# Patient Record
Sex: Female | Born: 1935 | Race: White | Hispanic: No | Marital: Married | Smoking: Never smoker
Health system: Southern US, Community
[De-identification: ages and names within clinical notes are randomized; demographics above are authoritative.]

## PROBLEM LIST (undated history)

## (undated) DIAGNOSIS — R112 Nausea with vomiting, unspecified: Secondary | ICD-10-CM

## (undated) DIAGNOSIS — G709 Myoneural disorder, unspecified: Secondary | ICD-10-CM

## (undated) DIAGNOSIS — I509 Heart failure, unspecified: Secondary | ICD-10-CM

## (undated) DIAGNOSIS — E039 Hypothyroidism, unspecified: Secondary | ICD-10-CM

## (undated) DIAGNOSIS — I499 Cardiac arrhythmia, unspecified: Secondary | ICD-10-CM

## (undated) DIAGNOSIS — G2581 Restless legs syndrome: Secondary | ICD-10-CM

## (undated) DIAGNOSIS — J189 Pneumonia, unspecified organism: Secondary | ICD-10-CM

## (undated) DIAGNOSIS — T8859XA Other complications of anesthesia, initial encounter: Secondary | ICD-10-CM

## (undated) DIAGNOSIS — J449 Chronic obstructive pulmonary disease, unspecified: Secondary | ICD-10-CM

## (undated) DIAGNOSIS — R600 Localized edema: Secondary | ICD-10-CM

## (undated) DIAGNOSIS — I10 Essential (primary) hypertension: Secondary | ICD-10-CM

## (undated) DIAGNOSIS — K5790 Diverticulosis of intestine, part unspecified, without perforation or abscess without bleeding: Secondary | ICD-10-CM

## (undated) DIAGNOSIS — I480 Paroxysmal atrial fibrillation: Secondary | ICD-10-CM

## (undated) DIAGNOSIS — K219 Gastro-esophageal reflux disease without esophagitis: Secondary | ICD-10-CM

## (undated) DIAGNOSIS — I700 Atherosclerosis of aorta: Secondary | ICD-10-CM

## (undated) DIAGNOSIS — J302 Other seasonal allergic rhinitis: Secondary | ICD-10-CM

## (undated) DIAGNOSIS — I5032 Chronic diastolic (congestive) heart failure: Secondary | ICD-10-CM

## (undated) DIAGNOSIS — Z9889 Other specified postprocedural states: Secondary | ICD-10-CM

## (undated) DIAGNOSIS — T4145XA Adverse effect of unspecified anesthetic, initial encounter: Secondary | ICD-10-CM

## (undated) DIAGNOSIS — M1990 Unspecified osteoarthritis, unspecified site: Secondary | ICD-10-CM

## (undated) DIAGNOSIS — K449 Diaphragmatic hernia without obstruction or gangrene: Secondary | ICD-10-CM

## (undated) HISTORY — PX: OTHER SURGICAL HISTORY: SHX169

## (undated) HISTORY — PX: EYE SURGERY: SHX253

## (undated) HISTORY — PX: ABDOMINAL HYSTERECTOMY: SHX81

## (undated) HISTORY — PX: ROTATOR CUFF REPAIR: SHX139

## (undated) HISTORY — PX: BREAST EXCISIONAL BIOPSY: SUR124

## (undated) HISTORY — DX: Chronic diastolic (congestive) heart failure: I50.32

## (undated) HISTORY — PX: BREAST SURGERY: SHX581

## (undated) HISTORY — PX: CARDIOVERSION: SHX1299

## (undated) HISTORY — DX: Atherosclerosis of aorta: I70.0

## (undated) HISTORY — PX: APPENDECTOMY: SHX54

## (undated) HISTORY — PX: TUBAL LIGATION: SHX77

## (undated) HISTORY — PX: RHINOPLASTY: SUR1284

## (undated) HISTORY — PX: CATARACT EXTRACTION: SUR2

---
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
## 1971-04-19 HISTORY — PX: THYROIDECTOMY: SHX17

## 1998-06-09 ENCOUNTER — Encounter: Payer: Self-pay | Admitting: Geriatric Medicine

## 1998-06-09 ENCOUNTER — Inpatient Hospital Stay (HOSPITAL_COMMUNITY): Admission: EM | Admit: 1998-06-09 | Discharge: 1998-06-15 | Payer: Self-pay | Admitting: Emergency Medicine

## 1998-06-10 ENCOUNTER — Encounter: Payer: Self-pay | Admitting: Geriatric Medicine

## 1998-06-12 ENCOUNTER — Encounter: Payer: Self-pay | Admitting: Geriatric Medicine

## 1998-06-13 ENCOUNTER — Encounter: Payer: Self-pay | Admitting: Geriatric Medicine

## 1998-06-15 ENCOUNTER — Encounter: Payer: Self-pay | Admitting: *Deleted

## 1998-07-13 ENCOUNTER — Encounter: Payer: Self-pay | Admitting: Gastroenterology

## 1998-07-13 ENCOUNTER — Ambulatory Visit (HOSPITAL_COMMUNITY): Admission: RE | Admit: 1998-07-13 | Discharge: 1998-07-13 | Payer: Self-pay | Admitting: Gastroenterology

## 1999-07-15 ENCOUNTER — Encounter: Payer: Self-pay | Admitting: *Deleted

## 1999-07-15 ENCOUNTER — Encounter: Admission: RE | Admit: 1999-07-15 | Discharge: 1999-07-15 | Payer: Self-pay | Admitting: *Deleted

## 1999-12-28 ENCOUNTER — Encounter: Admission: RE | Admit: 1999-12-28 | Discharge: 1999-12-28 | Payer: Self-pay | Admitting: *Deleted

## 1999-12-28 ENCOUNTER — Encounter: Payer: Self-pay | Admitting: Internal Medicine

## 2000-07-13 ENCOUNTER — Emergency Department (HOSPITAL_COMMUNITY): Admission: EM | Admit: 2000-07-13 | Discharge: 2000-07-13 | Payer: Self-pay | Admitting: Emergency Medicine

## 2000-07-13 ENCOUNTER — Encounter: Payer: Self-pay | Admitting: Emergency Medicine

## 2000-10-27 ENCOUNTER — Encounter: Payer: Self-pay | Admitting: Gastroenterology

## 2000-10-27 ENCOUNTER — Ambulatory Visit (HOSPITAL_COMMUNITY): Admission: RE | Admit: 2000-10-27 | Discharge: 2000-10-27 | Payer: Self-pay | Admitting: Gastroenterology

## 2001-01-09 ENCOUNTER — Encounter: Payer: Self-pay | Admitting: Internal Medicine

## 2001-01-09 ENCOUNTER — Encounter: Admission: RE | Admit: 2001-01-09 | Discharge: 2001-01-09 | Payer: Self-pay | Admitting: Internal Medicine

## 2001-04-23 ENCOUNTER — Ambulatory Visit (HOSPITAL_COMMUNITY): Admission: RE | Admit: 2001-04-23 | Discharge: 2001-04-23 | Payer: Self-pay | Admitting: Gastroenterology

## 2002-01-16 ENCOUNTER — Encounter: Admission: RE | Admit: 2002-01-16 | Discharge: 2002-01-16 | Payer: Self-pay | Admitting: Internal Medicine

## 2002-01-16 ENCOUNTER — Encounter: Payer: Self-pay | Admitting: Internal Medicine

## 2003-01-21 ENCOUNTER — Encounter: Payer: Self-pay | Admitting: Internal Medicine

## 2003-01-21 ENCOUNTER — Encounter: Admission: RE | Admit: 2003-01-21 | Discharge: 2003-01-21 | Payer: Self-pay | Admitting: Internal Medicine

## 2003-07-22 ENCOUNTER — Encounter: Admission: RE | Admit: 2003-07-22 | Discharge: 2003-07-22 | Payer: Self-pay | Admitting: Internal Medicine

## 2003-09-10 ENCOUNTER — Encounter: Admission: RE | Admit: 2003-09-10 | Discharge: 2003-09-10 | Payer: Self-pay | Admitting: Internal Medicine

## 2004-01-28 ENCOUNTER — Encounter: Admission: RE | Admit: 2004-01-28 | Discharge: 2004-01-28 | Payer: Self-pay | Admitting: Internal Medicine

## 2005-02-11 ENCOUNTER — Encounter: Admission: RE | Admit: 2005-02-11 | Discharge: 2005-02-11 | Payer: Self-pay | Admitting: Internal Medicine

## 2005-08-23 ENCOUNTER — Inpatient Hospital Stay (HOSPITAL_COMMUNITY): Admission: AD | Admit: 2005-08-23 | Discharge: 2005-08-26 | Payer: Self-pay | Admitting: Internal Medicine

## 2005-08-24 ENCOUNTER — Encounter (INDEPENDENT_AMBULATORY_CARE_PROVIDER_SITE_OTHER): Payer: Self-pay | Admitting: Interventional Cardiology

## 2005-11-03 ENCOUNTER — Ambulatory Visit (HOSPITAL_COMMUNITY): Admission: RE | Admit: 2005-11-03 | Discharge: 2005-11-03 | Payer: Self-pay | Admitting: Cardiology

## 2005-11-07 ENCOUNTER — Encounter: Admission: RE | Admit: 2005-11-07 | Discharge: 2005-11-07 | Payer: Self-pay | Admitting: Internal Medicine

## 2005-11-30 ENCOUNTER — Ambulatory Visit: Payer: Self-pay | Admitting: Critical Care Medicine

## 2005-12-02 ENCOUNTER — Ambulatory Visit: Payer: Self-pay | Admitting: Internal Medicine

## 2005-12-09 ENCOUNTER — Ambulatory Visit (HOSPITAL_COMMUNITY): Admission: RE | Admit: 2005-12-09 | Discharge: 2005-12-09 | Payer: Self-pay | Admitting: Cardiology

## 2005-12-22 ENCOUNTER — Ambulatory Visit: Payer: Self-pay | Admitting: Critical Care Medicine

## 2005-12-28 ENCOUNTER — Ambulatory Visit: Payer: Self-pay | Admitting: Critical Care Medicine

## 2006-01-04 ENCOUNTER — Encounter: Admission: RE | Admit: 2006-01-04 | Discharge: 2006-01-04 | Payer: Self-pay | Admitting: Internal Medicine

## 2006-01-09 ENCOUNTER — Encounter (INDEPENDENT_AMBULATORY_CARE_PROVIDER_SITE_OTHER): Payer: Self-pay | Admitting: Specialist

## 2006-01-09 ENCOUNTER — Other Ambulatory Visit: Admission: RE | Admit: 2006-01-09 | Discharge: 2006-01-09 | Payer: Self-pay | Admitting: Interventional Radiology

## 2006-01-09 ENCOUNTER — Encounter: Admission: RE | Admit: 2006-01-09 | Discharge: 2006-01-09 | Payer: Self-pay | Admitting: Internal Medicine

## 2006-02-13 ENCOUNTER — Encounter: Admission: RE | Admit: 2006-02-13 | Discharge: 2006-02-13 | Payer: Self-pay | Admitting: Internal Medicine

## 2006-03-31 ENCOUNTER — Ambulatory Visit: Payer: Self-pay | Admitting: Critical Care Medicine

## 2006-09-19 ENCOUNTER — Encounter: Admission: RE | Admit: 2006-09-19 | Discharge: 2006-09-19 | Payer: Self-pay | Admitting: Orthopedic Surgery

## 2006-09-21 ENCOUNTER — Ambulatory Visit (HOSPITAL_BASED_OUTPATIENT_CLINIC_OR_DEPARTMENT_OTHER): Admission: RE | Admit: 2006-09-21 | Discharge: 2006-09-22 | Payer: Self-pay | Admitting: Orthopedic Surgery

## 2007-02-15 ENCOUNTER — Encounter: Admission: RE | Admit: 2007-02-15 | Discharge: 2007-02-15 | Payer: Self-pay | Admitting: Internal Medicine

## 2007-02-21 ENCOUNTER — Encounter: Admission: RE | Admit: 2007-02-21 | Discharge: 2007-02-21 | Payer: Self-pay | Admitting: Internal Medicine

## 2008-02-18 ENCOUNTER — Encounter: Admission: RE | Admit: 2008-02-18 | Discharge: 2008-02-18 | Payer: Self-pay | Admitting: Internal Medicine

## 2008-10-31 ENCOUNTER — Ambulatory Visit (HOSPITAL_COMMUNITY): Admission: RE | Admit: 2008-10-31 | Discharge: 2008-10-31 | Payer: Self-pay | Admitting: Cardiology

## 2009-02-18 ENCOUNTER — Encounter: Admission: RE | Admit: 2009-02-18 | Discharge: 2009-02-18 | Payer: Self-pay | Admitting: Internal Medicine

## 2009-02-24 ENCOUNTER — Encounter: Admission: RE | Admit: 2009-02-24 | Discharge: 2009-02-24 | Payer: Self-pay | Admitting: Neurology

## 2009-03-05 ENCOUNTER — Encounter: Admission: RE | Admit: 2009-03-05 | Discharge: 2009-03-05 | Payer: Self-pay | Admitting: Neurology

## 2009-06-05 ENCOUNTER — Encounter: Admission: RE | Admit: 2009-06-05 | Discharge: 2009-06-05 | Payer: Self-pay | Admitting: Internal Medicine

## 2009-07-13 ENCOUNTER — Encounter: Admission: RE | Admit: 2009-07-13 | Discharge: 2009-07-13 | Payer: Self-pay | Admitting: Internal Medicine

## 2010-02-19 ENCOUNTER — Encounter: Admission: RE | Admit: 2010-02-19 | Discharge: 2010-02-19 | Payer: Self-pay | Admitting: Internal Medicine

## 2010-03-03 ENCOUNTER — Encounter: Admission: RE | Admit: 2010-03-03 | Discharge: 2010-03-03 | Payer: Self-pay | Admitting: Internal Medicine

## 2010-04-16 ENCOUNTER — Ambulatory Visit (HOSPITAL_COMMUNITY)
Admission: RE | Admit: 2010-04-16 | Discharge: 2010-04-16 | Payer: Self-pay | Source: Home / Self Care | Attending: Surgery | Admitting: Surgery

## 2010-04-16 ENCOUNTER — Encounter
Admission: RE | Admit: 2010-04-16 | Discharge: 2010-04-16 | Payer: Self-pay | Source: Home / Self Care | Attending: Surgery | Admitting: Surgery

## 2010-06-28 LAB — PROTIME-INR: Prothrombin Time: 15.4 seconds — ABNORMAL HIGH (ref 11.6–15.2)

## 2010-06-28 LAB — CBC
Hemoglobin: 15.8 g/dL — ABNORMAL HIGH (ref 12.0–15.0)
MCH: 33.9 pg (ref 26.0–34.0)
MCV: 94.6 fL (ref 78.0–100.0)
RBC: 4.66 MIL/uL (ref 3.87–5.11)
WBC: 6.1 10*3/uL (ref 4.0–10.5)

## 2010-06-28 LAB — BASIC METABOLIC PANEL
CO2: 26 mEq/L (ref 19–32)
Chloride: 103 mEq/L (ref 96–112)
GFR calc Af Amer: >60 mL/min (ref >60)
Sodium: 136 mEq/L (ref 135–145)

## 2010-06-28 LAB — APTT: aPTT: 40 seconds — ABNORMAL HIGH (ref 24–37)

## 2010-06-28 LAB — SURGICAL PCR SCREEN
MRSA, PCR: NEGATIVE
Staphylococcus aureus: NEGATIVE

## 2010-07-25 LAB — PROTIME-INR
INR: 2.9 — ABNORMAL HIGH (ref 0.00–1.49)
Prothrombin Time: 32.5 seconds — ABNORMAL HIGH (ref 11.6–15.2)

## 2010-07-25 LAB — APTT: aPTT: 55 seconds — ABNORMAL HIGH (ref 24–37)

## 2010-08-31 NOTE — Op Note (Signed)
NAME:  Natasha [name], Natasha [name]                 ACCOUNT NO.:  [id number]   MEDICAL RECORD NO.:  [id number]          PATIENT TYPE:  AMB   LOCATION:  DSC                          FACILITY:  [facility]   PHYSICIAN:  Katy Fitch. Sypher, M.D. DATE OF BIRTH:  1932/10/20   DATE OF PROCEDURE:  09/21/2006  DATE OF DISCHARGE:                               OPERATIVE REPORT   PREOPERATIVE DIAGNOSIS:  Chronic retracted right rotator cuff tear with  AC degenerative arthritis.   POSTOPERATIVE DIAGNOSIS:  Chronic retracted right rotator cuff tear with  AC degenerative arthritis with identification of a severely retracted  chronic right supraspinatus rotator cuff tear with degenerative changes  in infraspinatus and 50% infraspinatus retracted rotator cuff tear and  undermining of teres minor.   OPERATION:  1. Examination of right shoulder under anesthesia.  2. Subacromial decompression right shoulder with acromioplasty,      coracoacromial ligament release and acromioplasty.  3. Open resection of distal clavicle.  4. Open reconstruction of chronically retracted right rotator cuff      tear involving the entire infraspinatus and supraspinatus tendons      requiring release of the coracohumeral ligament, tenolysis of      rotator cuff, convergence and lateralization.   OPERATING SURGEON:  Josephine Igo, M.D.   ASSISTANT:  Molly Maduro Dasnoit PA-C.   ANESTHESIA:  General endotracheal supplemented by a right interscalene  block, supervising anesthesiologist is Dr. Gypsy Balsam.   INDICATIONS:  Natasha [name] is a 77 year old woman referred through the  courtesy of Dr. Lyna Poser for evaluation of a painful right  shoulder.   She has multiple medical problems including hypertension, coronary  artery disease, chronic Coumadin anticoagulation and recently the onset  of progressive right shoulder pain.   Clinical examination revealed signs of adhesive capsulitis and AC  arthropathy and probable rotator cuff tear on the  right due to weakness  of arm elevation and external rotation.  An MRI of the right shoulder  was obtained on August 03, 2006 at [facility] and interpreted by Dr.  Suzie Portela to reveal a retracted tear of supraspinatus tendon with an  irregular and edematous stump and the muscle volume loss of the  supraspinatus without fatty substitution.  There was an interstitial  tear of the infraspinatus, subscapularis tendinosis, AC arthropathy and  a shoulder joint effusion extending into the sub deltoid and subcoracoid  bursa.   After lengthy informed consent with Natasha [name], we advised her that she  was a candidate for reconstruction of her rotator cuff as long as she  was cleared by Dr. Kevan Ny and her cardiologist, Dr. Armanda Magic.   She was seen in consultation by Dr. Mayford Knife and had a stress test prior  to surgery.  Dr. Mayford Knife stated that her stress test was entirely normal  and that she was a proper candidate for general anesthesia.   Natasha [name] was advised to stop her Coumadin anticoagulation 5 days  prior to surgery.  A preoperative INR had normalized.   After informed consent, she is brought to the operating at this time  anticipating right shoulder glenohumeral  arthroscopy followed by open  reconstruction of a retracted chronic rotator cuff tear with distal  clavicle resection.  Preoperatively questions were invited and answered.   PROCEDURE:  Natasha [name] is brought to the operating room and placed in  supine position on the operating table.  Following anesthesia consult  with Dr. Gypsy Balsam an interscalene block was placed without complication on  the right.  This led to excellent anesthesia of the right arm and  forequarter.   Natasha [name] was brought to room two placed in the supine position on the  operating table and under Dr. Burnett Corrente direct supervision general  endotracheal anesthesia induced.  She was carefully positioned in the  beach-chair position with the aid of a torso and  head holder designed  for shoulder arthroscopy.   Due to right arm and forequarter prepped with DuraPrep and draped with  impervious arthroscopy drapes.   Examination of the shoulder under anesthesia revealed moderate adhesive  capsulitis with elevation 160, external rotation of 80, extension  lacking 20 degrees.   The shoulder was distended with 20 mL of sterile saline followed by  placement of the arthroscope through a standard posterior viewing  portal.  Diagnostic arthroscopy revealed a large retracted necrotic cuff  tear involving entire supraspinatus and most of the infraspinatus.  There is some undermining of the teres minor.  Subscapularis had  moderate tendinosis but was not retracted.  The labrum had minor  degenerative change.  The hyaline sigmoid cartilage of the glenoid  humeral head was intact.  A long head of the biceps had a stable origin  and tendon was normal into the rotator interval.  The scope was removed  from glenohumeral joint and briefly placed in the subacromial space.  There was florid bursitis noted.  Therefore we proceeded directly to  reconstruction of rotator cuff.   A 6 cm incision was fashioned on the distal clavicle across the anterior  acromion.  The subcutaneous tissue were sharply divided and hemostasis  achieved.  The acromial branch of the coracoacromial artery was  identified and electrocauterized followed by elevation of the anterior  third of the deltoid exposing the [facility] joint and bursa.  The distal 15 mm  clavicle was exposed subperiosteally and resected with an oscillating  saw.  The medial acromial osteophyte at the [facility] joint was removed with a  rongeur followed by leveling the acromion to a type 1 morphology with an  oscillating saw.  The cuff tear was chronic, retracted and exposing  considerable portion the humeral head.  The conjoint tendon was completely disrupted and retracted about 4 cm medially.   The margin of the tendon were  freshened with a 15 scalpel blade followed  by deep surface debridement with a rongeur.  The greater tuberosity was  decorticated deep to the teres minor infraspinatus and supraspinatus to  the level the rotator interval.  Two biocorkscrew anchors were placed  medially one at the central portion of the infraspinatus tendon, one at  the central portion of the supraspinatus tendon.  The tendon was then  converge with a gathering baseball stitch that was brought through a  [employer] drill holes, advancing the tendon laterally and closing the  defect in the conjoint tendon.  The entire rotator cuff mechanism was  lateralized after release of coracohumeral ligament and extensive  resection of fibrotic bursa.  A small Cobb elevator was used to lyse  bursal adhesions circumferentially.   Ultimately an anatomic repair was achieved with convergence utilizing  the baseball stitch and the supraspinatus foot print mattress sutures.  The infraspinatus was inset with mattress sutures posteriorly.   The triangular defect laterally was closed with a mattress suture of 0-0  Vicryl brought through bony drill holes.   The bursa was then gathered and after irrigation of the subacromial  space.  The deltoid repaired anatomically to the anterior acromion as  well as repaired to the trapezius closing the dead space at the site of  the distal clavicle resection.  The deltoid split laterally was closed  with simple suture of 0-0 Vicryl.   The wound was then repaired with subdermal sutures of 2-0 Vicryl and  intradermal 3-0 Prolene with Steri-Strips.   There no apparent complications.  Our blood loss was less than 20 mL.   The quality of the rotator cuff tendon was jeopardized due to chronic  degenerative tendinopathy; however, a watertight closure was achieved  with excellent opposition to the greater tuberosity.   Natasha [name]'s range of motion was checked after repair.  She had the  same elevation of  160 and the same defect in external rotation due to  contraction the anterior capsule.   We will address this with therapy postoperatively.   All the sponge and needle counts were correct.  Ms. Marasco was awakened  from general anesthesia and transferred to recovery room with stable  vital signs.   We will resume her Coumadin with a dose of 5 mg at 4:00 p.m. this  afternoon.  She will be maintained on IV vancomycin as a perioperative  antibiotic and will be given Dilaudid p.o. and IV as an analgesic.      Katy Fitch Sypher, M.D.  Electronically Signed     RVS/MEDQ  D:  09/21/2006  T:  09/21/2006  Job:  [id number]   cc:   Candyce Churn, M.D.  Armanda Magic, M.D.

## 2010-09-03 NOTE — Assessment & Plan Note (Signed)
[facility]                             PULMONARY OFFICE NOTE   NAME:[name], Natasha CROTTEAU                        MRN:          [id number]  DATE:03/31/2006                            DOB:          15-Jul-1932    Natasha [name] is a 77 year old white female with a history of cyclic cough,  felt to be due primarily from allergic rhinitis, postnasal drip, and  reflux disease.  Her cough is virtually gone now.  She is back on the  Prilosec 20 mg a day. BroveX is only p.r.n.   EXAM:  Temperature was 97.8.  Blood pressure 136/86.  Pulse 78.  Saturation 97% on room air.  CHEST:  Clear without evidence of wheeze or rhonchi.  CARDIAC:  Regular rate and rhythm without S3.  Normal S1, S2.  ABDOMEN:  Soft, nontender.  EXTREMITIES:  No edema or clubbing.  SKIN:  Clear.   IMPRESSION:  That of cyclic cough on the basis of reflux disease and  allergic rhinitis stable at this time.   PLAN:  For the patient to maintain Prilosec 20 mg daily, p.r.n.  Benzonatate, and p.r.n. BroveX.  We will see the patient back on an as-  needed basis.  She did ask about the small pulmonary nodule seen on the  CT scan in May of this year.  I reviewed that and informed the patient  that this was a benign condition and did not require further followup.  We will see the patient back on a p.r.n. basis in this office.     Charlcie Cradle Delford Field, MD, [facility]  Electronically Signed    PEW/MedQ  DD: 03/31/2006  DT: 03/31/2006  Job #: [id number]   cc:   Candyce Churn, M.D.

## 2010-09-03 NOTE — Discharge Summary (Signed)
NAME:  Natasha [name], Natasha [name] NO.:  [id number]   MEDICAL RECORD NO.:  [id number]          PATIENT TYPE:  INP   LOCATION:  2022                         FACILITY:  [facility]   PHYSICIAN:  Candyce Churn, M.D.DATE OF BIRTH:  1932-10-29   DATE OF ADMISSION:  08/23/2005  DATE OF DISCHARGE:  08/26/2005                                 DISCHARGE SUMMARY   DISCHARGE DIAGNOSES:  1.  New onset atrial fibrillation.  2.  Headache, likely migraine secondary to caffeine withdrawal -- improved.  3.  History of hypertension.  4.  History of hypothyroidism.  5.  Hormone replacement therapy in the form of Premarin.  6.  Gastroesophageal reflux disease with esophageal strictures.  7.  Fibromyalgia.  8.  History of pneumonia.  9.  Restless leg syndrome.  10. Seasonal allergies.  11. Migraine headaches.  12. Diverticulosis.   DISCHARGE MEDICATIONS:  1.  Cardizem SR 240 mg orally daily.  2.  Premarin 0.3 mg orally daily.  3.  Prilosec 20 mg orally daily.  4.  Synthroid 150 mcg orally daily.  5.  Lovenox 100 mg subcutaneously q.12 h. until INR between 2.0 and 3.0 on      Coumadin.  6.  Coumadin 5 mg daily.  7.  Nasal steroid spray, one spray in each nostril every other day --      Rhinocort Aqua or Nasacort Aqua.  8.  Ativan 0.5 mg orally p.o. h.s. p.r.n.  9.  Allegra 60 mg daily as needed.  10. Calcium carbonate 1200 mg with vitamin D daily.   CONSULTATIONS:  Cardiology -- Armanda Magic, M.D.   PROCEDURES:  1.  2-D echocardiogram -- transthoracic; performed Aug 24, 2005.  She was      found to have normal ejection fraction of 60-65%.  There were no      regional wall motion abnormalities, and ventricular wall thickness was      in the upper limits of normal.  There was mild-to-moderate mitral      valvular regurgitation.  Aortic valve thickness was mildly increased.      Left atrium diameter was upper limits of normal, at 39 mm.   1.  Myocardial perfusion study; performed Aug 22, 2005.  Revealed an ejection      fraction of 76%, and there were no perfusion defects.  Technetium 99 was      used.   HOSPITAL COURSE:  [name] was just presented to my office on Aug 23, 2005, complaining of a cough for one month.  She was felt to have a  pneumonia in April, but continued to cough incessantly.   She was found to be in a rapid irregular rhythm at approximately 130-140  beats per minute.  An EKG atrial fibrillation .   The patient was admitted and started on intravenous Cardizem and  subcutaneous Lovenox.  She was seen in consultation by cardiology, where she  underwent a 2-D echocardiogram and myocardial perfusion study (as above).   While hospitalized she did have a relatively severe headache on the second  day of  hospitalization.  After her myocardial perfusion study she responded  well to Cafergot suppository.  This was held, and she was treated with  morphine for headache (which was throbbing and nonfocal, with no peripheral  symptoms).  Once she was found to have no evidence of myocardial ischemia,  the Cafergot was allowed and her headaches improved rapidly.  It was gone by  hospital discharge.   She will be discharged on Coumadin therapy, as above.  To follow with Dr.  Armanda Magic in one week.  I will be happy to follow her at our Coumadin  clinic at [facility].   I was first concerned about a pulmonary embolus with some fleeting chest  pains and rapid atrial fibrillation; but chest CT on the day of admission  was without abnormality, which ruled out a pulmonary embolus.  She did have  some small apical pulmonary nodules that need to be followed up. We will  plan a repeat CT of the chest in 4-6 weeks for follow-up, in order to follow  these tiny nodules.   She has no symptoms to suggest metastatic neoplasm; with no anorexia, weight  loss, etc.   LABORATORIES WHILE ADMITTED:  Slight low potassium on admission of 3.3; this  corrected to 3.7  with oral supplementation on Aug 24, 2005.  Prothrombin time  on admission 13.1, and on discharge 13.4 with an INR 1.0.  White count (Aug 25, 2005) 5900, hemoglobin 12.7, platelet count 254,000.  Myocardial markers  while hospitalized were within normal limits, with CKs being 44, 43 and 35  respectively.  Troponin I being less than 0.01, then 0.02 and 0.02.  Urinalysis was normal.  Thyroid function revealed a TSH 0.487.  Free T3 of  2.5 (which is normal), and Free T4 of 0.142 (which is also normal).  Creatinine 0.6, BUN 7, calcium 8.5.   FOLLOWUP:  The patient will follow up in the office on Aug 29, 2005 for PT,  INR; and with Dr. Armanda Magic in one week.      Candyce Churn, M.D.  Electronically Signed     RNG/MEDQ  D:  08/28/2005  T:  08/29/2005  Job:  [id number]

## 2010-09-03 NOTE — H&P (Signed)
NAME:  Natasha [name], BEACHEM NO.:  [id number]   MEDICAL RECORD NO.:  [id number]          PATIENT TYPE:  INP   LOCATION:  2022                         FACILITY:  [facility]   PHYSICIAN:  Candyce Churn, M.D.DATE OF BIRTH:  02-22-1933   DATE OF ADMISSION:  08/23/2005  DATE OF DISCHARGE:                                HISTORY & PHYSICAL   CHIEF COMPLAINT:  Cough and rapid atrial fibrillation.   HISTORY OF PRESENT ILLNESS:  Natasha [name] is a pleasant 77 year old female  with a history of:  1.  Hypertension.  2.  Hypothyroidism.  3.  Hormone replacement therapy in the form of Premarin.  4.  GERD with esophageal stricture.  5.  Fibromyalgia.  6.  History of pneumonia.  7.  Restless leg syndrome.  8.  Seasonal allergies.  9.  Migraine headaches.  10. Diverticulosis.   Natasha [name] presents with approximately 1 month of cough which started with  symptoms of a sinus infection and URI with yellow-green drainage with  wheezing and congestion on July 19, 2005.  She was treated with Levaquin for  10 days as well as IM Depo-Medrol and nebulizer treatments with albuterol.   She was last seen in our office on July 25, 2005, and presents today with  continued complaint of cough.  She did have some fleeting shooting, stabbing-  like left chest pain near her left breast that occurred earlier today.  She  is not area of any palpitations.  Denies any PND or orthopnea.  No  exertionally related chest pain.   PAST MEDICAL HISTORY:   ALLERGIES:  PENICILLIN, ERYTHROMYCIN, TETRACYCLINE, BIAXIN, PREVACID,  CYMBALTA.  These are either allergies or intolerances to.   MEDICATIONS:  1.  Synthroid 150 mcg daily.  2.  Premarin 0.625 mg daily.  3.  Prilosec 20 mg daily.  4.  Ativan 0.5 mg p.o. q.h.s. p.r.n.  5.  Diovan HCT 160/12.5 daily.  6.  Lasix 40 mg daily on p.r.n. basis for leg edema.  7.  Calcium carbonate 600 b.i.d.  8.  Rhinocort Aqua 1 spray in each nostril at night p.r.n.  9.   Allegra 60 mg daily p.r.n.  10. Bactroban in saline solution nasally intermittently for episodic nasal      infection.  11. Senokot 1-2 at night to prevent constipation.   PAST SURGICAL HISTORY:  1.  TAH and BSO.  2.  Tubal ligation.  3.  Appendectomy.   FAMILY HISTORY:  Significant for lung cancer, coronary artery disease,  congestive heart failure, stroke, and Berger's disease.   SOCIAL HISTORY:  The patient is a housewife.  She is married to Mr. Darvin Neighbours, and they live in the [place] area.  She does not smoke and  rarely has an alcoholic beverage.   REVIEW OF SYSTEMS:  She has had some fleeting left chest pains which were  nonexertionally related and sharp earlier today.  She also had some, over  the last week or so, has noted some transient right lower extremity pain in  the right groin area and right calf, but  this has resolved.  She denies any  change in bowel habits.  She does have some stress urinary incontinence  which has been somewhat problematic with recent cough.  She has had  occasional sweats but no obvious fevers.   Denies any hematuria or blood per rectum.  No headaches or visual changes.   PHYSICAL EXAMINATION:  GENERAL:  Alert, no acute distress.  She does have a  dry cough.  VITAL SIGNS:  Blood pressure is 120/82; pulse is 132 and irregular;  temperature is 97; respiratory rate is 22 and nonlabored.  Weight is 222.  HEENT:  Atraumatic, normocephalic.  NECK:  Supple without JVD.  She has no obvious goiter.  No carotid bruits  noted.  No lymphadenopathy.  CHEST:  Clear to auscultation, but she does have a dry cough.  No expiratory  wheezes noted.  No rales.  CARDIAC:  Irregular rhythm with rapid rate, no obvious gallops, no murmurs.  ABDOMEN:  Soft, nontender.  Bowel sounds are normal, nondistended, no  rebound, no obvious organomegaly.  EXTREMITIES:  Negative Homan sign, no edema.  No palpable cords are present.  Nontender in all muscle groups  involving the lower extremities bilaterally.  Good capillary refill distally.  NEUROLOGIC:  Oriented x3, nonfocal.  SKIN:  Without rashes.   Chest x-ray:  Small streaky opacity in the left base suggestive of  atelectasis.  Otherwise, heart size within normal limits and no other  obvious abnormalities.  Laboratories reveal a normal CMET with a creatinine  of 0.9.  BNP is 160.  TSH is 0.42 - normal.  CBC with normal with a  hemoglobin of 15, platelet count 321,000, white count 7200.  Urinalysis  within normal limits.   EKG reveals atrial fibrillation at a rate of 132.  No ST segment elevation  or depression.  Nonspecific ST-T wave changes.   ASSESSMENT:  A 77 year old female with new onset rapid atrial fibrillation  with chest pain and cough.  Wondered if this might be a pulmonary embolus.   PLAN:  We will admit, monitor, and check 2 D echocardiogram, STAT chest CT  with rule out pulmonary embolus protocol.  Follow up on other laboratories  including cardiac enzymes.  We will treat with Lovenox 1 mg/kg q.12h.  subcu if she does have evidence to suggest pulmonary embolus.  Otherwise, we  will just use prophylactic dose of Lovenox of 40 mg subcu daily.  We will  check serial cardiac enzymes and start IV Diltiazem to keep heart rate 110  or less.  We will consult [facility].      Candyce Churn, M.D.  Electronically Signed     RNG/MEDQ  D:  08/23/2005  T:  08/23/2005  Job:  [id number]   cc:   Armanda Magic, M.D.  Fax: [phone]

## 2010-09-03 NOTE — Consult Note (Signed)
Natasha, [name] NO.:  [id number]   MEDICAL RECORD NO.:  [id number]          PATIENT TYPE:  INP   LOCATION:  2022                         FACILITY:  [facility]   PHYSICIAN:  Armanda Magic, M.D.     DATE OF BIRTH:  [birth year]   DATE OF CONSULTATION:  08/24/2005  DATE OF DISCHARGE:                                   CONSULTATION   CHIEF COMPLAINT:  New onset atrial fibrillation chest pain.   HISTORY OF PRESENT ILLNESS:  This is a 77 year old white female with no  prior cardiac history who was admitted with new onset atrial fibrillation of  unknown duration.  Apparently, she has had a cough for about a month and a  sinus infection with an upper respiratory infection and was treated with  Levaquin, Depo-Medrol and inhalers.  She was seen on Aug 23, 2005 by Dr.  Kevan Ny secondary to persistent cough and sharp stabbing chest pain.  She was  found to be in atrial fibrillation with rapid ventricular response and was  admitted.  A chest CT was negative for PE.  Chest pain is described as  sharp, lasting less than a minute, with radiation to the right neck, and is  nonexertional.  There is no exertional component to the pain.  She denies  any shortness of breath, nausea, or diaphoresis with the pain.  The pain is  very sporadic.   PAST MEDICAL HISTORY:  1.  Hypertension.  2.  Recent URI.  3.  Hypothyroidism.  4.  Gastroesophageal reflux disease with esophageal stricture.  5.  Fibromyalgia.  6.  Restless leg syndrome.  7.  Seasonal allergies.  8.  Migraine headaches.  9.  Diverticulosis.   ALLERGIES:  1.  PENICILLIN.  2.  ERYTHROMYCIN.  3.  TETRACYCLINE.  4.  BIAXIN.  5.  PREVACID.  6.  CYMBALTA.   SOCIAL HISTORY:  She is a housewife.  She is married.  She is denies any  tobacco use.  She occasionally drinks alcohol socially.   FAMILY HISTORY:  Her father died of CHF.  There is a family history of lung  cancer, coronary disease and __________ disease.   MEDICATIONS:  1.  Synthroid 150 mcg a day.  2.  Premarin 0.65 mg a day.  3.  Prilosec 20 mg a day.  4.  Ativan 0.5 mg p.o. q.h.s. p.r.n.  5.  Diovan HCT 160/12.5 mg a day.  6.  Lasix 40 mg p.r.n.  7.  Calcium carbonate 600 mg b.i.d.  8.  Rhinocort Aqua one spray each nostril at night p.r.n.  9.  Allegra 60 mg a day p.r.n.  10. Bactroban and saline solution nasally intermittently for episodic nasal      infection.  11. Senokot 1-2 at night to prevent constipation.   PAST SURGICAL HISTORY:  1.  Status post total abdominal hysterectomy with BSO.  2.  Status post tubal ligation.  3.  Status post appendectomy.   REVIEW OF SYSTEMS:  Other that what is stated in the HPI is negative.   PHYSICAL EXAMINATION:  VITAL SIGNS:  Blood  pressure is 108/73, heart rate  84.  She is afebrile.  GENERAL:  This is a well-developed, well-nourished female in no acute  distress.  HEENT:  Benign.  NECK:  Supple without lymphadenopathy.  Carotid upstrokes are +2  bilaterally, no bruits.  LUNGS:  Clear to auscultation throughout.  HEART:  Regular rate and rhythm.  No murmurs, rubs or gallops.  Normal S1  and S2.  ABDOMEN:  Soft, nontender, nondistended, with active bowel sounds.  No  hepatosplenomegaly.  EXTREMITIES:  No edema.   ELECTROCARDIOGRAM:  Electrocardiogram on admission showed atrial  fibrillation with rapid ventricular response, evidence of a septal  infarction and low voltage.   LABORATORY DATA:  White cell count 6.3, hemoglobin 12.5, hematocrit 35.4,  platelet count 252.  CPKs are 44 and 43.  MB 1.3 and 1.2.  Troponin less  than 0.01 and 0.02.  BNP 181, TSH 0.487.   ASSESSMENT:  1.  New onset atrial fibrillation with rapid ventricular response of unknown      duration now rate controlled on IV Cardizem drip.  2.  Atypical chest pain with negative enzymes x2.  Electrocardiogram is      nonischemic.  The pain is sharp and radiates up into the right neck, and      she does have a history  of gastroesophageal reflux with esophageal      stricture.  I suspect this is probably related to reflux.  3.  Hypertension, controlled.  4.  Hypothyroidism.  5.  Recent upper respiratory infection.  6.  Gastroesophageal reflux disease with esophageal stricture.  7.  Fibromyalgia.   PLAN:  1.  Agree with Lovenox for atrial fibrillation.  Start Coumadin 5 mg a day.      Will check a 2-D echocardiogram to evaluate LV function and left atrial      size.  Adenosine Cardiolite on Aug 25, 2005 to rule out inducible      ischemia as an etiology of new onset atrial fibrillation.  2.  Will get a Child psychotherapist involved to try to get home Lovenox therapy so      the patient does not have to stay here until her Coumadin is      therapeutic.  3.  Check a BMET.  4.  Transition the patient from IV Cardizem to p.o.  5.  The patient will have to be on Coumadin with a therapeutic INR of      greater than 2 for 4 weeks prior to planning a cardioversion.      Armanda Magic, M.D.  Electronically Signed     TT/MEDQ  D:  08/24/2005  T:  08/24/2005  Job:  [id number]   cc:   Candyce Churn, M.D.  Fax: [phone]

## 2010-09-03 NOTE — Op Note (Signed)
NAME:  Natasha [name], Natasha [name] NO.:  [id number]   MEDICAL RECORD NO.:  [id number]          PATIENT TYPE:  OIB   LOCATION:  2899                         FACILITY:  [facility]   PHYSICIAN:  Armanda Magic, M.D.     DATE OF BIRTH:  December 21, 1932   DATE OF PROCEDURE:  11/03/2005  DATE OF DISCHARGE:  11/03/2005                                 OPERATIVE REPORT   REFERRING PHYSICIAN:  Dr. Johnella Moloney.   PROCEDURE:  Direct current cardioversion.   SURGEON:  Armanda Magic, M.D.   INDICATIONS:  Atrial fibrillation with controlled ventricular response. INR  is greater than or equal to 2 x4 weeks.   COMPLICATIONS:  None.   IV MEDICATIONS:  Pentothal 175 mg IV.   This is a very pleasant 77 year old white female who recently had an upper  respiratory infection and subsequently went into atrial fibrillation with  rapid ventricular response. Heart rate became controlled on digoxin. She has  been on Coumadin with a therapeutic INR of greater than 2 for the past 4  weeks and now presents for cardioversion.  The patient was brought to the  day hospital in the fasting nonsedated state.  Informed consent was  obtained.  The patient was connected to continuous heart rate and pulse  oximetry monitoring and intermittent blood pressure monitor.  Defibrillator  pads were placed on the anterior and posterior left chest and after adequate  anesthesia was obtained a 100 joules synchronized biphasic shock was  delivered which successfully converted the patient to sinus rhythm with  occasional PACs. The patient tolerated the procedure well without any  complications.  The patient subsequently went home after she was fully awake  and ambulating.   ASSESSMENT:  1.  Atrial fibrillation with controlled ventricular response.  2.  Systemic anticoagulation with INR greater than or equal to 2 x4 weeks,      INR today is 2.5.  3.  Gastroesophageal reflux disease.  4.  Chronic cough.  5.  Atypical chest pain  with negative ischemia with no evidence of inducible      ischemia by recent adenosine Cardiolite.  6.  Normal left ventricular function by 2-D echocardiogram.   PLAN:  Coumadin 2.5 mg p.o. daily. Follow up with Riki Rusk in our Coumadin  clinic on July 24.  Follow-up with me on August 2 for further evaluation.  Continue current medications.      Armanda Magic, M.D.  Electronically Signed     TT/MEDQ  D:  11/03/2005  T:  11/04/2005  Job:  [id number]   cc:   Candyce Churn, M.D.

## 2010-09-03 NOTE — Assessment & Plan Note (Signed)
 HEALTHCARE                               PULMONARY OFFICE NOTE   NAME:CURTISSydney, Hasten                        MRN:          [id number]  DATE:12/28/2005                            DOB:          September 17, 1932    Ms. Natasha [name] is a 77 year old white female seen today in return followup for  cyclical cough.  The patient was found to have allergic rhinitis and reflux  disease as her primary cause for the cough.  We gave the cyclical cough  protocol.  Zegerid was begun at 40 mg daily, Brovex 5 cc b.i.d. p.r.n.  The  patient's cough is now resolving.  She is here today for followup pulmonary  function tests.   PHYSICAL EXAMINATION:  VITAL SIGNS:  Temperature 97, blood pressure 128/90,  pulse 89, saturation 97% on room air.  CHEST:  Completely clear without evidence of wheeze or rhonchi.  CARDIAC:  Regular rate and rhythm without S3.  Normal S1 and S2.  ABDOMEN:  Soft, nontender.  EXTREMITIES:  No edema or clubbing.  SKIN:  Clear.  NEUROLOGIC:  Intact.  NECK:  No jugular venous distension, no lymphadenopathy.   Pulmonary functions were obtained today and reviewed and revealed a normal  spirometry, FEV1 of 90% predicted, total lung capacity at 82% predicted,  diffusion capacity at 67% predicted.   IMPRESSION:  Cyclical cough exacerbated by reflux disease and allergic  rhinitis.   PLAN:  Migrate back from Zegerid to Prilosec once current prescription  expires.  She will use benzonatate p.r.n., Brovex p.r.n., and return to this  office for a recheck in two months.                                   Charlcie Cradle Delford Field, MD, FCCP   PEW/MedQ  DD:  12/28/2005  DT:  12/29/2005  Job #:  [id number]   cc:   Candyce Churn, M.D.  Armanda Magic, M.D.

## 2010-09-03 NOTE — Op Note (Signed)
NAME:  Natasha [name], Natasha [name] NO.:  [id number]   MEDICAL RECORD NO.:  [id number]          PATIENT TYPE:  OIB   LOCATION:  2852                         FACILITY:  [facility]   PHYSICIAN:  Armanda Magic, M.D.     DATE OF BIRTH:  1933/01/07   DATE OF PROCEDURE:  12/09/2005  DATE OF DISCHARGE:                                 OPERATIVE REPORT   REFERRING PHYSICIAN:  Dr. Johnella Moloney.   PROCEDURE:  Direct current cardioversion.   SURGEON:  Armanda Magic, M.D.   INDICATIONS:  Atrial fibrillation.   COMPLICATIONS:  None.   IV MEDICATIONS:  225 mg IV.   This is a 77 year old white female with a history of atrial fibrillation  status post initial cardioversion now presents with recurrent A fib and  presents for repeat cardioversion. The patient has a therapeutic INR which  has been documented therapeutic for the last 4 weeks.   The patient is brought to the day hospital in the fasting nonsedated state.  Informed consent was obtained.  The patient was connected to continuous  heart rate and pulse oximetry monitoring and intermittent blood pressure  monitoring.  The defibrillation pads were placed on the anterior and  posterior left chest. After adequate anesthesia was obtained, a synchronized  biphasic 100 joules shock was delivered which successfully converted the  patient to normal sinus rhythm.  The patient tolerated the procedure well  without complication.  After the patient was fully awake, she was discharged  to home.   ASSESSMENT:  1. Atrial fibrillation, recurrent after cardioversion.  2. Systemic anticoagulation with therapeutic INR.  3. Successful cardioversion normal sinus rhythm.   PLAN:  Discharge to home after fully awake.  Follow-up with me in 2 weeks.      Armanda Magic, M.D.  Electronically Signed    TT/MEDQ  D:  12/09/2005  T:  12/09/2005  Job:  [id number]   cc:   Candyce Churn, M.D.

## 2010-10-12 ENCOUNTER — Other Ambulatory Visit: Payer: Self-pay | Admitting: Neurology

## 2010-10-12 DIAGNOSIS — R51 Headache: Secondary | ICD-10-CM

## 2010-10-14 ENCOUNTER — Ambulatory Visit
Admission: RE | Admit: 2010-10-14 | Discharge: 2010-10-14 | Disposition: A | Discharge status: Home / Self Care | Payer: Medicare Other | Source: Ambulatory Visit | Attending: Neurology | Admitting: Neurology

## 2010-10-14 DIAGNOSIS — R51 Headache: Secondary | ICD-10-CM

## 2011-02-03 LAB — BASIC METABOLIC PANEL
Calcium: 8.8
Chloride: 104
Creatinine, Ser: 0.52
GFR calc Af Amer: >60
Sodium: 136

## 2011-02-03 LAB — PROTIME-INR
INR: 1
Prothrombin Time: 13.4

## 2011-02-26 ENCOUNTER — Other Ambulatory Visit: Payer: Self-pay | Admitting: Gastroenterology

## 2011-02-26 NOTE — H&P (Incomplete)
Natasha [name] is an 77 y.o. female.   Chief Complaint: Dysphagia and heartburn History: the patient is a 77 year old female with chronic gastroesophageal reflux. In 2002, she underwent an esophagogastroduodenoscopy with dilation of a benign peptic stricture at the esophagogastric junction unassociated with Barrett's esophagus. In 2007, her barium esophagram showed a moderate sized hiatal hernia but no esophageal obstruction by barium tablet swallow.  The patient is taking Protonix but still experiencing breakthrough heartburn with regurgitation. To treat breakthrough heartburn, she is taking TUMS. She is also experiencing intermittent solid food dysphagia.  The patient has chronic atrial fibrillation and takes Coumadin anticoagulation on a daily basis.  The patient is scheduled to undergo diagnostic esophagogastroduodenoscopy with possible balloon esophageal stricture dilation. She stop taking Coumadin 5 days prior to her scheduled esophagogastroduodenoscopy. No past medical history on file.  No past surgical history on file.  No family history on file. Social History:  does not have a smoking history on file. She does not have any smokeless tobacco history on file. Her alcohol and drug histories not on file.  Allergies: Not on File  No current outpatient prescriptions on file as of 02/26/2011.   No current facility-administered medications on file as of 02/26/2011.    No results found for this or any previous visit (from the past 48 hour(s)). No results found.  ROS  There were no vitals taken for this visit. Physical Exam   Assessment/Plan ***  Natasha [name] K 02/26/2011, 3:17 PM

## 2011-03-02 ENCOUNTER — Encounter (HOSPITAL_COMMUNITY): Payer: Self-pay

## 2011-03-03 ENCOUNTER — Ambulatory Visit (HOSPITAL_COMMUNITY)
Admission: RE | Admit: 2011-03-03 | Discharge: 2011-03-03 | Disposition: A | Discharge status: Home / Self Care | Payer: Medicare Other | Source: Ambulatory Visit | Attending: Gastroenterology | Admitting: Gastroenterology

## 2011-03-03 ENCOUNTER — Encounter (HOSPITAL_COMMUNITY): Payer: Self-pay | Admitting: *Deleted

## 2011-03-03 ENCOUNTER — Encounter (HOSPITAL_COMMUNITY)
Admission: RE | Disposition: A | Discharge status: Home / Self Care | Payer: Self-pay | Source: Ambulatory Visit | Attending: Gastroenterology

## 2011-03-03 DIAGNOSIS — K222 Esophageal obstruction: Secondary | ICD-10-CM | POA: Insufficient documentation

## 2011-03-03 DIAGNOSIS — R1310 Dysphagia, unspecified: Secondary | ICD-10-CM

## 2011-03-03 DIAGNOSIS — K449 Diaphragmatic hernia without obstruction or gangrene: Secondary | ICD-10-CM | POA: Insufficient documentation

## 2011-03-03 HISTORY — PX: BALLOON DILATION: SHX5330

## 2011-03-03 HISTORY — DX: Essential (primary) hypertension: I10

## 2011-03-03 HISTORY — DX: Localized edema: R60.0

## 2011-03-03 HISTORY — DX: Diaphragmatic hernia without obstruction or gangrene: K44.9

## 2011-03-03 HISTORY — PX: ESOPHAGOGASTRODUODENOSCOPY: SHX5428

## 2011-03-03 HISTORY — DX: Gastro-esophageal reflux disease without esophagitis: K21.9

## 2011-03-03 HISTORY — DX: Hypothyroidism, unspecified: E03.9

## 2011-03-03 HISTORY — DX: Other seasonal allergic rhinitis: J30.2

## 2011-03-03 HISTORY — DX: Other specified postprocedural states: R11.2

## 2011-03-03 HISTORY — DX: Other specified postprocedural states: Z98.890

## 2011-03-03 HISTORY — DX: Unspecified osteoarthritis, unspecified site: M19.90

## 2011-03-03 HISTORY — DX: Restless legs syndrome: G25.81

## 2011-03-03 HISTORY — DX: Pneumonia, unspecified organism: J18.9

## 2011-03-03 HISTORY — DX: Diverticulosis of intestine, part unspecified, without perforation or abscess without bleeding: K57.90

## 2011-03-03 SURGERY — EGD (ESOPHAGOGASTRODUODENOSCOPY)
Anesthesia: Moderate Sedation

## 2011-03-03 MED ORDER — FENTANYL CITRATE 0.05 MG/ML IJ SOLN
INTRAMUSCULAR | Status: AC
  Filled 2011-03-03: qty 2

## 2011-03-03 MED ORDER — BUTAMBEN-TETRACAINE-BENZOCAINE 2-2-14 % EX AERO
INHALATION_SPRAY | CUTANEOUS | Status: DC | PRN
  Administered 2011-03-03: 1 via TOPICAL

## 2011-03-03 MED ORDER — MIDAZOLAM HCL 10 MG/2ML IJ SOLN
INTRAMUSCULAR | Status: DC | PRN
  Administered 2011-03-03: 2 mg via INTRAVENOUS
  Administered 2011-03-03 (×2): 2.5 mg via INTRAVENOUS

## 2011-03-03 MED ORDER — SODIUM CHLORIDE 0.9 % IV SOLN
Freq: Once | INTRAVENOUS | Status: AC
  Administered 2011-03-03: 500 mL via INTRAVENOUS

## 2011-03-03 MED ORDER — MIDAZOLAM HCL 10 MG/2ML IJ SOLN
INTRAMUSCULAR | Status: AC
  Filled 2011-03-03: qty 2

## 2011-03-03 MED ORDER — FENTANYL NICU IV SYRINGE 50 MCG/ML
INJECTION | INTRAMUSCULAR | Status: DC | PRN
  Administered 2011-03-03: 50 ug via INTRAVENOUS
  Administered 2011-03-03: 25 ug via INTRAVENOUS

## 2011-03-04 ENCOUNTER — Encounter (HOSPITAL_COMMUNITY): Payer: Self-pay

## 2011-03-07 ENCOUNTER — Other Ambulatory Visit: Payer: Self-pay | Admitting: Internal Medicine

## 2011-03-07 DIAGNOSIS — Z1231 Encounter for screening mammogram for malignant neoplasm of breast: Secondary | ICD-10-CM

## 2011-03-15 ENCOUNTER — Encounter (HOSPITAL_COMMUNITY): Payer: Self-pay | Admitting: Gastroenterology

## 2011-04-01 ENCOUNTER — Ambulatory Visit
Admission: RE | Admit: 2011-04-01 | Discharge: 2011-04-01 | Disposition: A | Discharge status: Home / Self Care | Payer: Medicare Other | Source: Ambulatory Visit | Attending: Internal Medicine | Admitting: Internal Medicine

## 2011-04-01 DIAGNOSIS — Z1231 Encounter for screening mammogram for malignant neoplasm of breast: Secondary | ICD-10-CM

## 2011-04-07 ENCOUNTER — Other Ambulatory Visit: Payer: Self-pay | Admitting: Internal Medicine

## 2011-04-07 DIAGNOSIS — R928 Other abnormal and inconclusive findings on diagnostic imaging of breast: Secondary | ICD-10-CM

## 2011-04-20 ENCOUNTER — Ambulatory Visit
Admission: RE | Admit: 2011-04-20 | Discharge: 2011-04-20 | Disposition: A | Discharge status: Home / Self Care | Payer: Medicare Other | Source: Ambulatory Visit | Attending: Internal Medicine | Admitting: Internal Medicine

## 2011-04-20 DIAGNOSIS — R928 Other abnormal and inconclusive findings on diagnostic imaging of breast: Secondary | ICD-10-CM

## 2011-04-26 DIAGNOSIS — M779 Enthesopathy, unspecified: Secondary | ICD-10-CM | POA: Diagnosis not present

## 2011-04-26 DIAGNOSIS — M24119 Other articular cartilage disorders, unspecified shoulder: Secondary | ICD-10-CM | POA: Diagnosis not present

## 2011-04-26 DIAGNOSIS — M75120 Complete rotator cuff tear or rupture of unspecified shoulder, not specified as traumatic: Secondary | ICD-10-CM | POA: Diagnosis not present

## 2011-04-28 DIAGNOSIS — Z7901 Long term (current) use of anticoagulants: Secondary | ICD-10-CM | POA: Diagnosis not present

## 2011-04-28 DIAGNOSIS — I4891 Unspecified atrial fibrillation: Secondary | ICD-10-CM | POA: Diagnosis not present

## 2011-05-18 DIAGNOSIS — E063 Autoimmune thyroiditis: Secondary | ICD-10-CM | POA: Insufficient documentation

## 2011-05-18 DIAGNOSIS — Z9229 Personal history of other drug therapy: Secondary | ICD-10-CM | POA: Insufficient documentation

## 2011-05-18 DIAGNOSIS — I4819 Other persistent atrial fibrillation: Secondary | ICD-10-CM | POA: Insufficient documentation

## 2011-05-18 DIAGNOSIS — Z8669 Personal history of other diseases of the nervous system and sense organs: Secondary | ICD-10-CM | POA: Insufficient documentation

## 2011-05-18 DIAGNOSIS — R0789 Other chest pain: Secondary | ICD-10-CM | POA: Insufficient documentation

## 2011-05-18 DIAGNOSIS — Z7901 Long term (current) use of anticoagulants: Secondary | ICD-10-CM | POA: Insufficient documentation

## 2011-05-18 DIAGNOSIS — M1990 Unspecified osteoarthritis, unspecified site: Secondary | ICD-10-CM | POA: Insufficient documentation

## 2011-05-20 DIAGNOSIS — Z79899 Other long term (current) drug therapy: Secondary | ICD-10-CM | POA: Diagnosis not present

## 2011-05-20 DIAGNOSIS — I4891 Unspecified atrial fibrillation: Secondary | ICD-10-CM | POA: Diagnosis not present

## 2011-05-26 DIAGNOSIS — Z7901 Long term (current) use of anticoagulants: Secondary | ICD-10-CM | POA: Diagnosis not present

## 2011-05-26 DIAGNOSIS — I4891 Unspecified atrial fibrillation: Secondary | ICD-10-CM | POA: Diagnosis not present

## 2011-06-08 ENCOUNTER — Other Ambulatory Visit: Payer: Self-pay | Admitting: Internal Medicine

## 2011-06-08 DIAGNOSIS — R1011 Right upper quadrant pain: Secondary | ICD-10-CM | POA: Diagnosis not present

## 2011-06-13 ENCOUNTER — Ambulatory Visit
Admission: RE | Admit: 2011-06-13 | Discharge: 2011-06-13 | Disposition: A | Discharge status: Home / Self Care | Payer: Medicare Other | Source: Ambulatory Visit | Attending: Internal Medicine | Admitting: Internal Medicine

## 2011-06-13 DIAGNOSIS — R143 Flatulence: Secondary | ICD-10-CM | POA: Diagnosis not present

## 2011-06-13 DIAGNOSIS — R1011 Right upper quadrant pain: Secondary | ICD-10-CM | POA: Diagnosis not present

## 2011-06-13 DIAGNOSIS — R142 Eructation: Secondary | ICD-10-CM | POA: Diagnosis not present

## 2011-06-21 DIAGNOSIS — R1011 Right upper quadrant pain: Secondary | ICD-10-CM | POA: Diagnosis not present

## 2011-06-23 DIAGNOSIS — Z7901 Long term (current) use of anticoagulants: Secondary | ICD-10-CM | POA: Diagnosis not present

## 2011-06-23 DIAGNOSIS — I4891 Unspecified atrial fibrillation: Secondary | ICD-10-CM | POA: Diagnosis not present

## 2011-06-27 DIAGNOSIS — I10 Essential (primary) hypertension: Secondary | ICD-10-CM | POA: Diagnosis not present

## 2011-06-27 DIAGNOSIS — J209 Acute bronchitis, unspecified: Secondary | ICD-10-CM | POA: Diagnosis not present

## 2011-07-01 DIAGNOSIS — Z7901 Long term (current) use of anticoagulants: Secondary | ICD-10-CM | POA: Diagnosis not present

## 2011-07-01 DIAGNOSIS — I10 Essential (primary) hypertension: Secondary | ICD-10-CM | POA: Diagnosis not present

## 2011-07-01 DIAGNOSIS — I4891 Unspecified atrial fibrillation: Secondary | ICD-10-CM | POA: Diagnosis not present

## 2011-07-05 DIAGNOSIS — R1011 Right upper quadrant pain: Secondary | ICD-10-CM | POA: Diagnosis not present

## 2011-07-05 DIAGNOSIS — Z79899 Other long term (current) drug therapy: Secondary | ICD-10-CM | POA: Diagnosis not present

## 2011-07-11 DIAGNOSIS — I10 Essential (primary) hypertension: Secondary | ICD-10-CM | POA: Diagnosis not present

## 2011-07-11 DIAGNOSIS — J209 Acute bronchitis, unspecified: Secondary | ICD-10-CM | POA: Diagnosis not present

## 2011-07-11 DIAGNOSIS — L509 Urticaria, unspecified: Secondary | ICD-10-CM | POA: Diagnosis not present

## 2011-07-21 DIAGNOSIS — Z7901 Long term (current) use of anticoagulants: Secondary | ICD-10-CM | POA: Diagnosis not present

## 2011-07-21 DIAGNOSIS — I4891 Unspecified atrial fibrillation: Secondary | ICD-10-CM | POA: Diagnosis not present

## 2011-07-28 DIAGNOSIS — I4891 Unspecified atrial fibrillation: Secondary | ICD-10-CM | POA: Diagnosis not present

## 2011-07-28 DIAGNOSIS — Z7901 Long term (current) use of anticoagulants: Secondary | ICD-10-CM | POA: Diagnosis not present

## 2011-08-02 DIAGNOSIS — R51 Headache: Secondary | ICD-10-CM | POA: Diagnosis not present

## 2011-08-18 DIAGNOSIS — I4891 Unspecified atrial fibrillation: Secondary | ICD-10-CM | POA: Diagnosis not present

## 2011-08-18 DIAGNOSIS — Z7901 Long term (current) use of anticoagulants: Secondary | ICD-10-CM | POA: Diagnosis not present

## 2011-09-14 DIAGNOSIS — Z7901 Long term (current) use of anticoagulants: Secondary | ICD-10-CM | POA: Diagnosis not present

## 2011-09-14 DIAGNOSIS — R0602 Shortness of breath: Secondary | ICD-10-CM | POA: Diagnosis not present

## 2011-09-14 DIAGNOSIS — R5381 Other malaise: Secondary | ICD-10-CM | POA: Diagnosis not present

## 2011-09-14 DIAGNOSIS — Z79899 Other long term (current) drug therapy: Secondary | ICD-10-CM | POA: Diagnosis not present

## 2011-09-14 DIAGNOSIS — I10 Essential (primary) hypertension: Secondary | ICD-10-CM | POA: Diagnosis not present

## 2011-09-14 DIAGNOSIS — R1011 Right upper quadrant pain: Secondary | ICD-10-CM | POA: Diagnosis not present

## 2011-09-14 DIAGNOSIS — L509 Urticaria, unspecified: Secondary | ICD-10-CM | POA: Diagnosis not present

## 2011-09-14 DIAGNOSIS — I4891 Unspecified atrial fibrillation: Secondary | ICD-10-CM | POA: Diagnosis not present

## 2011-09-29 ENCOUNTER — Emergency Department (HOSPITAL_COMMUNITY)
Admission: EM | Admit: 2011-09-29 | Discharge: 2011-09-29 | Disposition: A | Discharge status: Home / Self Care | Payer: Medicare Other | Attending: Emergency Medicine | Admitting: Emergency Medicine

## 2011-09-29 ENCOUNTER — Encounter (HOSPITAL_COMMUNITY): Payer: Self-pay | Admitting: Emergency Medicine

## 2011-09-29 DIAGNOSIS — H9210 Otorrhea, unspecified ear: Secondary | ICD-10-CM | POA: Diagnosis not present

## 2011-09-29 DIAGNOSIS — I4891 Unspecified atrial fibrillation: Secondary | ICD-10-CM | POA: Diagnosis not present

## 2011-09-29 DIAGNOSIS — K219 Gastro-esophageal reflux disease without esophagitis: Secondary | ICD-10-CM | POA: Diagnosis not present

## 2011-09-29 DIAGNOSIS — H938X9 Other specified disorders of ear, unspecified ear: Secondary | ICD-10-CM | POA: Diagnosis not present

## 2011-09-29 DIAGNOSIS — H9220 Otorrhagia, unspecified ear: Secondary | ICD-10-CM

## 2011-09-29 DIAGNOSIS — Z7901 Long term (current) use of anticoagulants: Secondary | ICD-10-CM | POA: Diagnosis not present

## 2011-09-29 LAB — CBC
HCT: 40.2 % (ref 36.0–46.0)
MCHC: 35.3 g/dL (ref 30.0–36.0)
MCV: 93.9 fL (ref 78.0–100.0)
Platelets: 233 10*3/uL (ref 150–400)
RDW: 13 % (ref 11.5–15.5)

## 2011-09-29 LAB — DIFFERENTIAL
Basophils Absolute: 0 10*3/uL (ref 0.0–0.1)
Basophils Relative: 1 % (ref 0–1)
Eosinophils Relative: 2 % (ref 0–5)
Monocytes Absolute: 0.7 10*3/uL (ref 0.1–1.0)
Neutro Abs: 3.7 10*3/uL (ref 1.7–7.7)

## 2011-09-29 LAB — BASIC METABOLIC PANEL
Calcium: 9 mg/dL (ref 8.4–10.5)
Creatinine, Ser: 0.79 mg/dL (ref 0.50–1.10)
GFR calc Af Amer: >90 mL/min (ref >90)
GFR calc non Af Amer: 78 mL/min — ABNORMAL LOW (ref >90)

## 2011-09-29 LAB — PROTIME-INR: Prothrombin Time: 38 seconds — ABNORMAL HIGH (ref 11.6–15.2)

## 2011-09-29 NOTE — ED Provider Notes (Signed)
History     CSN: [id number]  Arrival date & time 09/29/11  1953   First MD Initiated Contact with Patient 09/29/11 2256      Chief Complaint  Patient presents with  . Ear Fullness    (Consider location/radiation/quality/duration/timing/severity/associated sxs/prior treatment) HPI Comments: Patient is on coumadin.  Felt fulness in her ear, put finger in and there was blood.  No hearing loss or pain.  No injury or trauma otherwise.  Patient is a 78 y.o. female presenting with plugged ear sensation. The history is provided by the patient.  Ear Fullness This is a new problem. The current episode started 1 to 2 hours ago. The problem occurs constantly. The problem has not changed since onset.   Past Medical History  Diagnosis Date  . A-fib   . GERD (gastroesophageal reflux disease)   . Hypertension   . Headache   . Seasonal allergies   . Lower leg edema   . Hypothyroidism   . Restless leg syndrome   . DJD (degenerative joint disease)   . Diverticular disease   . PONV (postoperative nausea and vomiting)   . Dysphagia   . Shortness of breath   . Pneumonia     history of  . Hiatal hernia     Past Surgical History  Procedure Date  . Rhinoplasty   . Abdominal hysterectomy   . Appendectomy   . Tubal ligation   . Eye surgery     cataracat  . Cardioversion     x3  . Breast surgery     left lumpectomy  . Tonsillectomy 1942  . Thyroidectomy 1973  . Bladder polyps   . Rotator cuff repair   . Esophagogastroduodenoscopy 03/03/2011    Procedure: ESOPHAGOGASTRODUODENOSCOPY (EGD);  Surgeon: Charolett Bumpers, MD;  Location: [facility];  Service: Endoscopy;  Laterality: N/A;  . Balloon dilation 03/03/2011    Procedure: BALLOON DILATION;  Surgeon: Charolett Bumpers, MD;  Location: [facility];  Service: Endoscopy;  Laterality: N/A;    Family History  Problem Relation Age of Onset  . Anesthesia problems Neg Hx   . Hypotension Neg Hx   . Malignant hyperthermia Neg Hx   .  Pseudochol deficiency Neg Hx     History  Substance Use Topics  . Smoking status: Never Smoker   . Smokeless tobacco: Not on file  . Alcohol Use: No    OB History    Grav Para Term Preterm Abortions TAB SAB Ect Mult Living                  Review of Systems  All other systems reviewed and are negative.    Allergies  Tramadol; Codeine; Erythromycin; Penicillins; Tetracyclines & related; and Diltiazem  Home Medications   Current Outpatient Rx  Name Route Sig Dispense Refill  . BUTALBITAL-APAP-CAFF-COD 50-325-40-30 MG PO CAPS Oral Take 1 capsule by mouth every 4 (four) hours as needed. For migraines    . CITRACAL + D PO Oral Take 1 tablet by mouth 2 (two) times daily.    Marland Kitchen ESTROGENS CONJUGATED 0.625 MG PO TABS Oral Take 0.625 mg by mouth daily. Take daily for 21 days then do not take for 7 days.     Marland Kitchen FLECAINIDE ACETATE 50 MG PO TABS Oral Take 50 mg by mouth 2 (two) times daily.      . FUROSEMIDE 40 MG PO TABS Oral Take 40 mg by mouth daily.      Marland Kitchen LEVOTHYROXINE SODIUM 150 MCG  PO TABS Oral Take 150 mcg by mouth daily.      Marland Kitchen LORAZEPAM 1 MG PO TABS Oral Take 0.5-1 mg by mouth at bedtime as needed. For sleep    . MAGNESIUM 400 MG PO CAPS Oral Take 1 tablet by mouth daily.    . NEBIVOLOL HCL 5 MG PO TABS Oral Take 5 mg by mouth daily.    Marland Kitchen OMEPRAZOLE 20 MG PO CPDR Oral Take 20 mg by mouth daily.    Marland Kitchen POTASSIUM CHLORIDE ER 10 MEQ PO TBCR Oral Take 10 mEq by mouth daily.      Marland Kitchen ALIGN PO Oral Take 1 tablet by mouth daily.    Marland Kitchen VITAMIN B-2 PO Oral Take 400 mg by mouth daily.    Marland Kitchen RIZATRIPTAN BENZOATE 10 MG PO TABS Oral Take 10 mg by mouth as needed. May repeat in 2 hours if needed     . WARFARIN SODIUM 5 MG PO TABS Oral Take 2.5-5 mg by mouth every other day. Alternates daily with 2.5mg  & 5mg . Due to take 5mg  on 09/29/11.      BP 143/88  Pulse 60  Temp 97 F (36.1 C) (Oral)  Resp 18  SpO2 100%  Physical Exam  Nursing note and vitals reviewed. Constitutional: She is oriented  to person, place, and time. She appears well-developed and well-nourished. No distress.  HENT:  Head: Normocephalic and atraumatic.  Right Ear: External ear normal.  Left Ear: External ear normal.       The right and left tm's appear normal.  There is blood in the right ear canal but no active bleeding.    Neck: Normal range of motion.  Neurological: She is alert and oriented to person, place, and time.  Skin: Skin is warm and dry. She is not diaphoretic.    ED Course  Procedures (including critical care time)  Labs Reviewed  BASIC METABOLIC PANEL - Abnormal; Notable for the following:    Glucose, Bld 108 (*)     GFR calc non Af Amer 78 (*)     All other components within normal limits  PROTIME-INR - Abnormal; Notable for the following:    Prothrombin Time 38.0 (*)     INR 3.80 (*)     All other components within normal limits  CBC  DIFFERENTIAL   No results found.   No diagnosis found.    MDM  There is blood in the canal but no active bleeding.  The inr is 3.8 and I will recommend holding the coumadin for the next two days and follow up next week.  To return prn.  No medication necessary for the ear.  I instructed her to use cotton if the bleeding recurs.        Geoffery Lyons, MD 09/29/11 [id number]

## 2011-09-29 NOTE — ED Notes (Signed)
PT. REPORTS RIGHT  EAR " FULLNESS" WITH BLEEDING THIS EVENING , STATES CURRENTLY TAKING COUMADIN , ALSO REPORTS FELL LAST Sunday WITH BRUISE AT LEFT BUTTOCK.

## 2011-09-29 NOTE — ED Notes (Signed)
PT ambulated with a steady gait; VSS; A&Ox3; no signs of distress; respirations even and unlabored; skin warm and dry; no questions at this time.  

## 2011-09-29 NOTE — Discharge Instructions (Signed)
Hold your coumadin for the next two days.  Anticoagulation, Generic Anticoagulants are medications used to prevent clots from developing in your veins. These medications are also known as blood thinners. If blood clots are untreated, they could travel to your lungs. This is called a pulmonary embolus. A blood clot in your lungs can be fatal.  Caregivers often use anticoagulants to prevent clots following surgery. Anticoagulants are also used along with aspirin when the heart is not getting enough blood. Another anticoagulant called warfarin is started 2 to 3 days after a rapid-acting injectable anticoagulant is started. The rapid-acting anticoagulants are usually continued until warfarin has begun to work. Your caregiver will judge this length of time by measuring a blood test known as the International Normalization Ratio (INR). An INR of 2 to 3 is desirable for most medical conditions. This means that your blood is at the necessary and best level to prevent clots. RISKS AND COMPLICATIONS  If you have received recent epidural anesthesia, spinal anesthesia, or a spinal tap while receiving anticoagulants, you are at risk for developing a blood clot in or around the spine. This condition could result in long-term or permanent paralysis.   Because anticoagulants thin your blood, severe bleeding may occur from any tissue or organ. Symptoms of the blood being too thin may include:   Bleeding from the nose or gums that does not stop quickly.   Unusual bruising or bruising easily.   Swelling or pain at an injection site.   A cut that does not stop bleeding within 10 minutes.   Continual nausea for more than 1 day or vomiting blood.   Coughing up blood.   Blood in the urine which may appear as pink, red, or brown urine.   Blood in bowel movements which may appear as red, dark or black stools.   Sudden weakness or numbness of the face, arm, or leg, especially on one side of the body.   Sudden  confusion.   Trouble speaking (aphasia) or understanding.   Sudden trouble seeing in one or both eyes.   Sudden trouble walking.   Dizziness.   Loss of balance or coordination.   Severe pain, such as a headache, joint pain, or back pain.   Fever.  HOME CARE INSTRUCTIONS   Due to the complications of anticoagulants, it is very important that you take your anticoagulant as directed by your caregiver. Anticoagulants need to be taken exactly as instructed. Be sure you understand all your anticoagulant instructions.   Changes in medicines, supplements, diet, and illness can affect your anticoagulation therapy. Be sure to inform your caregivers of any of these changes.   While on anticoagulants, you will need to have blood tests done routinely as directed by your caregivers.   Be careful not to cut yourself when using sharp objects.   Avoid heavy or variable alcohol use. Consume alcohol only in very limited quantities. General alcohol intake guidelines are 1 drink for nonpregnant women and 2 drinks for men per day. (1 drink = 5 ounces of wine, 12 ounces of beer, or 1  ounces of liquor). A sudden increase in alcohol use can increase your risk of bleeding. Chronic alcohol use can cause warfarin to be less effective.   Limit physical activities or sports that could result in a fall or cause injury.   It is extremely important that you tell all of your caregivers and dentist that you are taking an anticoagulant, especially if you are injured or plan to  have any type of procedure or operation.   Follow up with your laboratory test and caregiver appointments as directed. It is very important to keep your appointments. Not keeping appointments could result in a chronic or permanent injury, pain, or disability.  SEEK MEDICAL CARE IF:   You develop any rashes.   You have any worsening of the condition for which you are receiving anticoagulation therapy.  SEEK IMMEDIATE MEDICAL CARE IF:    Bleeding from the nose or gums does not stop quickly.   You have unusual bruising or are bruising easily.   Swelling or pain occurs at an injection site.   A cut does not stop bleeding within 10 minutes.   You have continual nausea for more than 1 day or are vomiting blood.   You are coughing up blood.   You have blood in the urine.   You have dark or black stools.   You have sudden weakness or numbness of the face, arm, or leg, especially on one side of the body.   You have sudden confusion.   You have trouble speaking (aphasia) or understanding.   You have sudden trouble seeing in one or both eyes.   You have sudden trouble walking.   You have dizziness.   You have a loss of balance or coordination.   You have severe pain, such as a headache, joint pain, or back pain.   You have a serious fall or head injury, even if you are not bleeding.   You have an oral temperature above 102 F (38.9 C), not controlled by medicine.  ANY OF THESE SYMPTOMS MAY REPRESENT A SERIOUS PROBLEM THAT IS AN EMERGENCY. Do not wait to see if the symptoms will go away. Get medical help right away. Call your local emergency services (911 in [place] DO NOT drive yourself to the hospital. MAKE SURE YOU:   Understand these instructions.   Will watch your condition.   Will get help right away if you are not doing well or get worse.  Document Released: 04/04/2005 Document Revised: 03/24/2011 Document Reviewed: 11/07/2007 [facility] Patient Information 2012 [place], [place].

## 2011-09-30 DIAGNOSIS — H9210 Otorrhea, unspecified ear: Secondary | ICD-10-CM | POA: Diagnosis not present

## 2011-09-30 DIAGNOSIS — Z7901 Long term (current) use of anticoagulants: Secondary | ICD-10-CM | POA: Diagnosis not present

## 2011-09-30 DIAGNOSIS — I998 Other disorder of circulatory system: Secondary | ICD-10-CM | POA: Diagnosis not present

## 2011-09-30 DIAGNOSIS — T148 Other injury of unspecified body region: Secondary | ICD-10-CM | POA: Diagnosis not present

## 2011-10-04 DIAGNOSIS — I4891 Unspecified atrial fibrillation: Secondary | ICD-10-CM | POA: Diagnosis not present

## 2011-10-04 DIAGNOSIS — I10 Essential (primary) hypertension: Secondary | ICD-10-CM | POA: Diagnosis not present

## 2011-10-04 DIAGNOSIS — R1011 Right upper quadrant pain: Secondary | ICD-10-CM | POA: Diagnosis not present

## 2011-10-04 DIAGNOSIS — L509 Urticaria, unspecified: Secondary | ICD-10-CM | POA: Diagnosis not present

## 2011-10-04 DIAGNOSIS — Z0000 Encounter for general adult medical examination without abnormal findings: Secondary | ICD-10-CM | POA: Diagnosis not present

## 2011-10-04 DIAGNOSIS — R5381 Other malaise: Secondary | ICD-10-CM | POA: Diagnosis not present

## 2011-10-04 DIAGNOSIS — Z1331 Encounter for screening for depression: Secondary | ICD-10-CM | POA: Diagnosis not present

## 2011-10-04 DIAGNOSIS — Z7901 Long term (current) use of anticoagulants: Secondary | ICD-10-CM | POA: Diagnosis not present

## 2011-10-04 DIAGNOSIS — R5383 Other fatigue: Secondary | ICD-10-CM | POA: Diagnosis not present

## 2011-10-18 DIAGNOSIS — I4891 Unspecified atrial fibrillation: Secondary | ICD-10-CM | POA: Diagnosis not present

## 2011-10-18 DIAGNOSIS — Z7901 Long term (current) use of anticoagulants: Secondary | ICD-10-CM | POA: Diagnosis not present

## 2011-10-24 DIAGNOSIS — I4891 Unspecified atrial fibrillation: Secondary | ICD-10-CM | POA: Diagnosis not present

## 2011-10-24 DIAGNOSIS — Z7901 Long term (current) use of anticoagulants: Secondary | ICD-10-CM | POA: Diagnosis not present

## 2011-11-01 DIAGNOSIS — E039 Hypothyroidism, unspecified: Secondary | ICD-10-CM | POA: Diagnosis not present

## 2011-11-01 DIAGNOSIS — E063 Autoimmune thyroiditis: Secondary | ICD-10-CM | POA: Diagnosis not present

## 2011-11-02 DIAGNOSIS — R1011 Right upper quadrant pain: Secondary | ICD-10-CM | POA: Diagnosis not present

## 2011-11-02 DIAGNOSIS — I4891 Unspecified atrial fibrillation: Secondary | ICD-10-CM | POA: Diagnosis not present

## 2011-11-02 DIAGNOSIS — L509 Urticaria, unspecified: Secondary | ICD-10-CM | POA: Diagnosis not present

## 2011-11-02 DIAGNOSIS — I10 Essential (primary) hypertension: Secondary | ICD-10-CM | POA: Diagnosis not present

## 2011-11-02 DIAGNOSIS — R5381 Other malaise: Secondary | ICD-10-CM | POA: Diagnosis not present

## 2011-11-21 DIAGNOSIS — Z7901 Long term (current) use of anticoagulants: Secondary | ICD-10-CM | POA: Diagnosis not present

## 2011-11-21 DIAGNOSIS — I4891 Unspecified atrial fibrillation: Secondary | ICD-10-CM | POA: Diagnosis not present

## 2011-11-28 DIAGNOSIS — I4891 Unspecified atrial fibrillation: Secondary | ICD-10-CM | POA: Diagnosis not present

## 2011-11-28 DIAGNOSIS — Z7901 Long term (current) use of anticoagulants: Secondary | ICD-10-CM | POA: Diagnosis not present

## 2011-12-08 DIAGNOSIS — I4891 Unspecified atrial fibrillation: Secondary | ICD-10-CM | POA: Diagnosis not present

## 2011-12-08 DIAGNOSIS — Z7901 Long term (current) use of anticoagulants: Secondary | ICD-10-CM | POA: Diagnosis not present

## 2012-01-03 DIAGNOSIS — I119 Hypertensive heart disease without heart failure: Secondary | ICD-10-CM | POA: Diagnosis not present

## 2012-01-03 DIAGNOSIS — Z7901 Long term (current) use of anticoagulants: Secondary | ICD-10-CM | POA: Diagnosis not present

## 2012-01-03 DIAGNOSIS — I4891 Unspecified atrial fibrillation: Secondary | ICD-10-CM | POA: Diagnosis not present

## 2012-01-03 DIAGNOSIS — Z23 Encounter for immunization: Secondary | ICD-10-CM | POA: Diagnosis not present

## 2012-01-26 DIAGNOSIS — Z7901 Long term (current) use of anticoagulants: Secondary | ICD-10-CM | POA: Diagnosis not present

## 2012-01-26 DIAGNOSIS — I4891 Unspecified atrial fibrillation: Secondary | ICD-10-CM | POA: Diagnosis not present

## 2012-02-02 DIAGNOSIS — Z79899 Other long term (current) drug therapy: Secondary | ICD-10-CM | POA: Diagnosis not present

## 2012-02-02 DIAGNOSIS — I4891 Unspecified atrial fibrillation: Secondary | ICD-10-CM | POA: Diagnosis not present

## 2012-02-27 DIAGNOSIS — Z7901 Long term (current) use of anticoagulants: Secondary | ICD-10-CM | POA: Diagnosis not present

## 2012-02-27 DIAGNOSIS — I4891 Unspecified atrial fibrillation: Secondary | ICD-10-CM | POA: Diagnosis not present

## 2012-03-19 ENCOUNTER — Other Ambulatory Visit: Payer: Self-pay | Admitting: Internal Medicine

## 2012-03-19 DIAGNOSIS — Z1231 Encounter for screening mammogram for malignant neoplasm of breast: Secondary | ICD-10-CM

## 2012-03-26 DIAGNOSIS — I4891 Unspecified atrial fibrillation: Secondary | ICD-10-CM | POA: Diagnosis not present

## 2012-03-29 DIAGNOSIS — I4891 Unspecified atrial fibrillation: Secondary | ICD-10-CM | POA: Diagnosis not present

## 2012-03-29 DIAGNOSIS — Z7901 Long term (current) use of anticoagulants: Secondary | ICD-10-CM | POA: Diagnosis not present

## 2012-04-13 DIAGNOSIS — Z7901 Long term (current) use of anticoagulants: Secondary | ICD-10-CM | POA: Diagnosis not present

## 2012-04-13 DIAGNOSIS — I4891 Unspecified atrial fibrillation: Secondary | ICD-10-CM | POA: Diagnosis not present

## 2012-04-20 ENCOUNTER — Ambulatory Visit
Admission: RE | Admit: 2012-04-20 | Discharge: 2012-04-20 | Disposition: A | Discharge status: Home / Self Care | Payer: Medicare Other | Source: Ambulatory Visit | Attending: Internal Medicine | Admitting: Internal Medicine

## 2012-04-20 DIAGNOSIS — Z1231 Encounter for screening mammogram for malignant neoplasm of breast: Secondary | ICD-10-CM | POA: Diagnosis not present

## 2012-04-23 DIAGNOSIS — Z7901 Long term (current) use of anticoagulants: Secondary | ICD-10-CM | POA: Diagnosis not present

## 2012-04-23 DIAGNOSIS — I4891 Unspecified atrial fibrillation: Secondary | ICD-10-CM | POA: Diagnosis not present

## 2012-04-30 DIAGNOSIS — Z79890 Hormone replacement therapy (postmenopausal): Secondary | ICD-10-CM | POA: Diagnosis not present

## 2012-04-30 DIAGNOSIS — R5382 Chronic fatigue, unspecified: Secondary | ICD-10-CM | POA: Diagnosis not present

## 2012-04-30 DIAGNOSIS — E039 Hypothyroidism, unspecified: Secondary | ICD-10-CM | POA: Diagnosis not present

## 2012-04-30 DIAGNOSIS — E559 Vitamin D deficiency, unspecified: Secondary | ICD-10-CM | POA: Diagnosis not present

## 2012-04-30 DIAGNOSIS — Z79899 Other long term (current) drug therapy: Secondary | ICD-10-CM | POA: Diagnosis not present

## 2012-04-30 DIAGNOSIS — I4891 Unspecified atrial fibrillation: Secondary | ICD-10-CM | POA: Diagnosis not present

## 2012-04-30 DIAGNOSIS — I10 Essential (primary) hypertension: Secondary | ICD-10-CM | POA: Diagnosis not present

## 2012-04-30 DIAGNOSIS — K219 Gastro-esophageal reflux disease without esophagitis: Secondary | ICD-10-CM | POA: Diagnosis not present

## 2012-05-02 DIAGNOSIS — I4891 Unspecified atrial fibrillation: Secondary | ICD-10-CM | POA: Diagnosis not present

## 2012-05-02 DIAGNOSIS — Z7901 Long term (current) use of anticoagulants: Secondary | ICD-10-CM | POA: Diagnosis not present

## 2012-05-03 DIAGNOSIS — Z79899 Other long term (current) drug therapy: Secondary | ICD-10-CM | POA: Diagnosis not present

## 2012-05-03 DIAGNOSIS — Z01810 Encounter for preprocedural cardiovascular examination: Secondary | ICD-10-CM | POA: Diagnosis not present

## 2012-05-03 DIAGNOSIS — I4891 Unspecified atrial fibrillation: Secondary | ICD-10-CM | POA: Diagnosis not present

## 2012-05-09 ENCOUNTER — Other Ambulatory Visit: Payer: Self-pay | Admitting: Internal Medicine

## 2012-05-09 DIAGNOSIS — E063 Autoimmune thyroiditis: Secondary | ICD-10-CM | POA: Diagnosis not present

## 2012-05-09 DIAGNOSIS — E039 Hypothyroidism, unspecified: Secondary | ICD-10-CM | POA: Diagnosis not present

## 2012-05-09 DIAGNOSIS — I4891 Unspecified atrial fibrillation: Secondary | ICD-10-CM | POA: Diagnosis not present

## 2012-05-09 DIAGNOSIS — Z7901 Long term (current) use of anticoagulants: Secondary | ICD-10-CM | POA: Diagnosis not present

## 2012-05-11 ENCOUNTER — Ambulatory Visit
Admission: RE | Admit: 2012-05-11 | Discharge: 2012-05-11 | Disposition: A | Discharge status: Home / Self Care | Payer: Medicare Other | Source: Ambulatory Visit | Attending: Internal Medicine | Admitting: Internal Medicine

## 2012-05-11 DIAGNOSIS — Z09 Encounter for follow-up examination after completed treatment for conditions other than malignant neoplasm: Secondary | ICD-10-CM | POA: Diagnosis not present

## 2012-05-11 DIAGNOSIS — E063 Autoimmune thyroiditis: Secondary | ICD-10-CM

## 2012-05-16 DIAGNOSIS — I4891 Unspecified atrial fibrillation: Secondary | ICD-10-CM | POA: Diagnosis not present

## 2012-05-16 DIAGNOSIS — Z7901 Long term (current) use of anticoagulants: Secondary | ICD-10-CM | POA: Diagnosis not present

## 2012-05-23 DIAGNOSIS — R5381 Other malaise: Secondary | ICD-10-CM | POA: Diagnosis not present

## 2012-05-23 DIAGNOSIS — K449 Diaphragmatic hernia without obstruction or gangrene: Secondary | ICD-10-CM | POA: Diagnosis not present

## 2012-05-23 DIAGNOSIS — I4891 Unspecified atrial fibrillation: Secondary | ICD-10-CM | POA: Diagnosis not present

## 2012-05-23 DIAGNOSIS — Z7901 Long term (current) use of anticoagulants: Secondary | ICD-10-CM | POA: Diagnosis not present

## 2012-05-23 DIAGNOSIS — K222 Esophageal obstruction: Secondary | ICD-10-CM | POA: Diagnosis not present

## 2012-05-23 DIAGNOSIS — Z8679 Personal history of other diseases of the circulatory system: Secondary | ICD-10-CM | POA: Diagnosis not present

## 2012-05-23 DIAGNOSIS — R5383 Other fatigue: Secondary | ICD-10-CM | POA: Diagnosis not present

## 2012-05-24 DIAGNOSIS — Z5181 Encounter for therapeutic drug level monitoring: Secondary | ICD-10-CM | POA: Diagnosis not present

## 2012-05-24 DIAGNOSIS — I491 Atrial premature depolarization: Secondary | ICD-10-CM | POA: Diagnosis not present

## 2012-05-24 DIAGNOSIS — R9431 Abnormal electrocardiogram [ECG] [EKG]: Secondary | ICD-10-CM | POA: Diagnosis not present

## 2012-05-24 DIAGNOSIS — I4891 Unspecified atrial fibrillation: Secondary | ICD-10-CM | POA: Diagnosis not present

## 2012-05-24 DIAGNOSIS — I440 Atrioventricular block, first degree: Secondary | ICD-10-CM | POA: Diagnosis not present

## 2012-05-24 DIAGNOSIS — Z79899 Other long term (current) drug therapy: Secondary | ICD-10-CM | POA: Diagnosis not present

## 2012-05-25 DIAGNOSIS — I4891 Unspecified atrial fibrillation: Secondary | ICD-10-CM | POA: Diagnosis not present

## 2012-05-25 DIAGNOSIS — Z79899 Other long term (current) drug therapy: Secondary | ICD-10-CM | POA: Diagnosis not present

## 2012-05-25 DIAGNOSIS — I491 Atrial premature depolarization: Secondary | ICD-10-CM | POA: Diagnosis not present

## 2012-05-25 DIAGNOSIS — Z5181 Encounter for therapeutic drug level monitoring: Secondary | ICD-10-CM | POA: Diagnosis not present

## 2012-05-25 DIAGNOSIS — R9431 Abnormal electrocardiogram [ECG] [EKG]: Secondary | ICD-10-CM | POA: Diagnosis not present

## 2012-05-25 DIAGNOSIS — I440 Atrioventricular block, first degree: Secondary | ICD-10-CM | POA: Diagnosis not present

## 2012-05-28 DIAGNOSIS — I4891 Unspecified atrial fibrillation: Secondary | ICD-10-CM | POA: Diagnosis not present

## 2012-05-28 DIAGNOSIS — Z7901 Long term (current) use of anticoagulants: Secondary | ICD-10-CM | POA: Diagnosis not present

## 2012-06-04 DIAGNOSIS — I4891 Unspecified atrial fibrillation: Secondary | ICD-10-CM | POA: Diagnosis not present

## 2012-06-04 DIAGNOSIS — Z79890 Hormone replacement therapy (postmenopausal): Secondary | ICD-10-CM | POA: Diagnosis not present

## 2012-06-04 DIAGNOSIS — Z7901 Long term (current) use of anticoagulants: Secondary | ICD-10-CM | POA: Diagnosis not present

## 2012-06-04 DIAGNOSIS — R5382 Chronic fatigue, unspecified: Secondary | ICD-10-CM | POA: Diagnosis not present

## 2012-06-04 DIAGNOSIS — E063 Autoimmune thyroiditis: Secondary | ICD-10-CM | POA: Diagnosis not present

## 2012-06-04 DIAGNOSIS — I10 Essential (primary) hypertension: Secondary | ICD-10-CM | POA: Diagnosis not present

## 2012-06-04 DIAGNOSIS — K219 Gastro-esophageal reflux disease without esophagitis: Secondary | ICD-10-CM | POA: Diagnosis not present

## 2012-06-04 DIAGNOSIS — H6190 Disorder of external ear, unspecified, unspecified ear: Secondary | ICD-10-CM | POA: Diagnosis not present

## 2012-06-07 DIAGNOSIS — Z7901 Long term (current) use of anticoagulants: Secondary | ICD-10-CM | POA: Diagnosis not present

## 2012-06-07 DIAGNOSIS — I4891 Unspecified atrial fibrillation: Secondary | ICD-10-CM | POA: Diagnosis not present

## 2012-07-03 DIAGNOSIS — Z79899 Other long term (current) drug therapy: Secondary | ICD-10-CM | POA: Diagnosis not present

## 2012-07-03 DIAGNOSIS — I119 Hypertensive heart disease without heart failure: Secondary | ICD-10-CM | POA: Diagnosis not present

## 2012-07-03 DIAGNOSIS — Z7901 Long term (current) use of anticoagulants: Secondary | ICD-10-CM | POA: Diagnosis not present

## 2012-07-03 DIAGNOSIS — I4891 Unspecified atrial fibrillation: Secondary | ICD-10-CM | POA: Diagnosis not present

## 2012-07-09 DIAGNOSIS — I4891 Unspecified atrial fibrillation: Secondary | ICD-10-CM | POA: Diagnosis not present

## 2012-07-26 DIAGNOSIS — Z7901 Long term (current) use of anticoagulants: Secondary | ICD-10-CM | POA: Diagnosis not present

## 2012-07-26 DIAGNOSIS — I4891 Unspecified atrial fibrillation: Secondary | ICD-10-CM | POA: Diagnosis not present

## 2012-08-13 DIAGNOSIS — I4891 Unspecified atrial fibrillation: Secondary | ICD-10-CM | POA: Diagnosis not present

## 2012-08-23 DIAGNOSIS — Z7901 Long term (current) use of anticoagulants: Secondary | ICD-10-CM | POA: Diagnosis not present

## 2012-08-23 DIAGNOSIS — I4891 Unspecified atrial fibrillation: Secondary | ICD-10-CM | POA: Diagnosis not present

## 2012-09-20 DIAGNOSIS — I4891 Unspecified atrial fibrillation: Secondary | ICD-10-CM | POA: Diagnosis not present

## 2012-09-20 DIAGNOSIS — Z7901 Long term (current) use of anticoagulants: Secondary | ICD-10-CM | POA: Diagnosis not present

## 2012-10-26 DIAGNOSIS — H25049 Posterior subcapsular polar age-related cataract, unspecified eye: Secondary | ICD-10-CM | POA: Diagnosis not present

## 2012-10-26 DIAGNOSIS — Z7901 Long term (current) use of anticoagulants: Secondary | ICD-10-CM | POA: Diagnosis not present

## 2012-10-26 DIAGNOSIS — I4891 Unspecified atrial fibrillation: Secondary | ICD-10-CM | POA: Diagnosis not present

## 2012-11-06 DIAGNOSIS — E039 Hypothyroidism, unspecified: Secondary | ICD-10-CM | POA: Diagnosis not present

## 2012-11-08 DIAGNOSIS — E039 Hypothyroidism, unspecified: Secondary | ICD-10-CM | POA: Diagnosis not present

## 2012-11-08 DIAGNOSIS — E063 Autoimmune thyroiditis: Secondary | ICD-10-CM | POA: Diagnosis not present

## 2012-11-12 DIAGNOSIS — I440 Atrioventricular block, first degree: Secondary | ICD-10-CM | POA: Diagnosis not present

## 2012-11-12 DIAGNOSIS — Z79899 Other long term (current) drug therapy: Secondary | ICD-10-CM | POA: Diagnosis not present

## 2012-11-12 DIAGNOSIS — Z9889 Other specified postprocedural states: Secondary | ICD-10-CM | POA: Diagnosis not present

## 2012-11-12 DIAGNOSIS — Z006 Encounter for examination for normal comparison and control in clinical research program: Secondary | ICD-10-CM | POA: Diagnosis not present

## 2012-11-12 DIAGNOSIS — I4891 Unspecified atrial fibrillation: Secondary | ICD-10-CM | POA: Diagnosis not present

## 2012-11-12 DIAGNOSIS — Z7901 Long term (current) use of anticoagulants: Secondary | ICD-10-CM | POA: Diagnosis not present

## 2012-11-12 DIAGNOSIS — I491 Atrial premature depolarization: Secondary | ICD-10-CM | POA: Diagnosis not present

## 2012-11-12 DIAGNOSIS — I10 Essential (primary) hypertension: Secondary | ICD-10-CM | POA: Diagnosis not present

## 2012-11-30 DIAGNOSIS — I4891 Unspecified atrial fibrillation: Secondary | ICD-10-CM | POA: Diagnosis not present

## 2012-11-30 DIAGNOSIS — Z7901 Long term (current) use of anticoagulants: Secondary | ICD-10-CM | POA: Diagnosis not present

## 2012-12-27 DIAGNOSIS — Z7901 Long term (current) use of anticoagulants: Secondary | ICD-10-CM | POA: Diagnosis not present

## 2012-12-27 DIAGNOSIS — I10 Essential (primary) hypertension: Secondary | ICD-10-CM | POA: Diagnosis not present

## 2012-12-27 DIAGNOSIS — I4891 Unspecified atrial fibrillation: Secondary | ICD-10-CM | POA: Diagnosis not present

## 2013-01-16 ENCOUNTER — Other Ambulatory Visit: Payer: Self-pay | Admitting: Dermatology

## 2013-01-16 DIAGNOSIS — L821 Other seborrheic keratosis: Secondary | ICD-10-CM | POA: Diagnosis not present

## 2013-01-16 DIAGNOSIS — L723 Sebaceous cyst: Secondary | ICD-10-CM | POA: Diagnosis not present

## 2013-01-16 DIAGNOSIS — L851 Acquired keratosis [keratoderma] palmaris et plantaris: Secondary | ICD-10-CM | POA: Diagnosis not present

## 2013-01-20 DIAGNOSIS — Z23 Encounter for immunization: Secondary | ICD-10-CM | POA: Diagnosis not present

## 2013-02-07 ENCOUNTER — Ambulatory Visit (INDEPENDENT_AMBULATORY_CARE_PROVIDER_SITE_OTHER): Payer: Medicare Other | Admitting: Pharmacist

## 2013-02-07 DIAGNOSIS — I4891 Unspecified atrial fibrillation: Secondary | ICD-10-CM

## 2013-02-07 LAB — POCT INR: INR: 2.8

## 2013-02-12 ENCOUNTER — Other Ambulatory Visit: Payer: Self-pay | Admitting: Cardiology

## 2013-03-21 ENCOUNTER — Ambulatory Visit (INDEPENDENT_AMBULATORY_CARE_PROVIDER_SITE_OTHER): Payer: Medicare Other | Admitting: Pharmacist

## 2013-03-21 DIAGNOSIS — I4891 Unspecified atrial fibrillation: Secondary | ICD-10-CM | POA: Diagnosis not present

## 2013-03-21 LAB — POCT INR: INR: 3.7

## 2013-04-03 ENCOUNTER — Ambulatory Visit (INDEPENDENT_AMBULATORY_CARE_PROVIDER_SITE_OTHER): Payer: Medicare Other | Admitting: Pharmacist

## 2013-04-03 DIAGNOSIS — I4891 Unspecified atrial fibrillation: Secondary | ICD-10-CM | POA: Diagnosis not present

## 2013-04-03 LAB — POCT INR: INR: 2.3

## 2013-04-23 DIAGNOSIS — I4891 Unspecified atrial fibrillation: Secondary | ICD-10-CM | POA: Diagnosis not present

## 2013-04-23 DIAGNOSIS — R109 Unspecified abdominal pain: Secondary | ICD-10-CM | POA: Diagnosis not present

## 2013-04-23 DIAGNOSIS — E063 Autoimmune thyroiditis: Secondary | ICD-10-CM | POA: Diagnosis not present

## 2013-04-23 DIAGNOSIS — Z1331 Encounter for screening for depression: Secondary | ICD-10-CM | POA: Diagnosis not present

## 2013-04-23 DIAGNOSIS — G9332 Myalgic encephalomyelitis/chronic fatigue syndrome: Secondary | ICD-10-CM | POA: Diagnosis not present

## 2013-04-23 DIAGNOSIS — E559 Vitamin D deficiency, unspecified: Secondary | ICD-10-CM | POA: Diagnosis not present

## 2013-04-23 DIAGNOSIS — Z0000 Encounter for general adult medical examination without abnormal findings: Secondary | ICD-10-CM | POA: Diagnosis not present

## 2013-04-23 DIAGNOSIS — I10 Essential (primary) hypertension: Secondary | ICD-10-CM | POA: Diagnosis not present

## 2013-04-23 DIAGNOSIS — R5382 Chronic fatigue, unspecified: Secondary | ICD-10-CM | POA: Diagnosis not present

## 2013-04-23 DIAGNOSIS — Z79899 Other long term (current) drug therapy: Secondary | ICD-10-CM | POA: Diagnosis not present

## 2013-04-23 DIAGNOSIS — K219 Gastro-esophageal reflux disease without esophagitis: Secondary | ICD-10-CM | POA: Diagnosis not present

## 2013-04-30 ENCOUNTER — Other Ambulatory Visit: Payer: Self-pay

## 2013-04-30 ENCOUNTER — Ambulatory Visit (INDEPENDENT_AMBULATORY_CARE_PROVIDER_SITE_OTHER): Payer: Medicare Other | Admitting: Pharmacist

## 2013-04-30 DIAGNOSIS — I4891 Unspecified atrial fibrillation: Secondary | ICD-10-CM | POA: Diagnosis not present

## 2013-04-30 DIAGNOSIS — Z1231 Encounter for screening mammogram for malignant neoplasm of breast: Secondary | ICD-10-CM

## 2013-04-30 LAB — POCT INR: INR: 3.4

## 2013-05-20 ENCOUNTER — Ambulatory Visit
Admission: RE | Admit: 2013-05-20 | Discharge: 2013-05-20 | Disposition: A | Discharge status: Home / Self Care | Payer: Medicare Other | Source: Ambulatory Visit

## 2013-05-20 DIAGNOSIS — Z1231 Encounter for screening mammogram for malignant neoplasm of breast: Secondary | ICD-10-CM

## 2013-05-22 DIAGNOSIS — I4891 Unspecified atrial fibrillation: Secondary | ICD-10-CM | POA: Diagnosis not present

## 2013-05-23 ENCOUNTER — Ambulatory Visit (INDEPENDENT_AMBULATORY_CARE_PROVIDER_SITE_OTHER): Payer: Medicare Other | Admitting: Pharmacist

## 2013-05-23 DIAGNOSIS — I4891 Unspecified atrial fibrillation: Secondary | ICD-10-CM

## 2013-05-23 LAB — POCT INR: INR: 3.1

## 2013-06-18 ENCOUNTER — Encounter: Payer: Self-pay | Admitting: Cardiology

## 2013-06-18 DIAGNOSIS — I10 Essential (primary) hypertension: Secondary | ICD-10-CM | POA: Insufficient documentation

## 2013-06-18 DIAGNOSIS — R600 Localized edema: Secondary | ICD-10-CM | POA: Insufficient documentation

## 2013-06-18 DIAGNOSIS — G2581 Restless legs syndrome: Secondary | ICD-10-CM | POA: Insufficient documentation

## 2013-06-18 DIAGNOSIS — E039 Hypothyroidism, unspecified: Secondary | ICD-10-CM | POA: Insufficient documentation

## 2013-06-18 DIAGNOSIS — K219 Gastro-esophageal reflux disease without esophagitis: Secondary | ICD-10-CM | POA: Insufficient documentation

## 2013-06-18 DIAGNOSIS — K5790 Diverticulosis of intestine, part unspecified, without perforation or abscess without bleeding: Secondary | ICD-10-CM | POA: Insufficient documentation

## 2013-06-18 DIAGNOSIS — K449 Diaphragmatic hernia without obstruction or gangrene: Secondary | ICD-10-CM | POA: Insufficient documentation

## 2013-06-18 DIAGNOSIS — R112 Nausea with vomiting, unspecified: Secondary | ICD-10-CM | POA: Insufficient documentation

## 2013-06-18 DIAGNOSIS — M1990 Unspecified osteoarthritis, unspecified site: Secondary | ICD-10-CM | POA: Insufficient documentation

## 2013-06-18 DIAGNOSIS — Z9889 Other specified postprocedural states: Secondary | ICD-10-CM

## 2013-06-20 ENCOUNTER — Ambulatory Visit (INDEPENDENT_AMBULATORY_CARE_PROVIDER_SITE_OTHER): Payer: Medicare Other | Admitting: Pharmacist

## 2013-06-20 DIAGNOSIS — I4891 Unspecified atrial fibrillation: Secondary | ICD-10-CM

## 2013-06-20 LAB — POCT INR: INR: 2.7

## 2013-06-27 ENCOUNTER — Ambulatory Visit: Payer: Medicare Other | Admitting: Cardiology

## 2013-07-12 ENCOUNTER — Ambulatory Visit: Payer: Medicare Other | Admitting: Cardiology

## 2013-07-18 ENCOUNTER — Ambulatory Visit (INDEPENDENT_AMBULATORY_CARE_PROVIDER_SITE_OTHER): Payer: Medicare Other | Admitting: Pharmacist

## 2013-07-18 DIAGNOSIS — I4891 Unspecified atrial fibrillation: Secondary | ICD-10-CM | POA: Diagnosis not present

## 2013-07-18 LAB — POCT INR: INR: 3.1

## 2013-07-29 ENCOUNTER — Other Ambulatory Visit: Payer: Self-pay | Admitting: Gastroenterology

## 2013-08-02 ENCOUNTER — Encounter: Payer: Self-pay | Admitting: Cardiology

## 2013-08-02 ENCOUNTER — Telehealth: Payer: Self-pay | Admitting: Pharmacist

## 2013-08-02 ENCOUNTER — Ambulatory Visit (INDEPENDENT_AMBULATORY_CARE_PROVIDER_SITE_OTHER): Payer: Medicare Other | Admitting: Cardiology

## 2013-08-02 ENCOUNTER — Ambulatory Visit (INDEPENDENT_AMBULATORY_CARE_PROVIDER_SITE_OTHER): Payer: Medicare Other | Admitting: Pharmacist

## 2013-08-02 VITALS — BP 144/85 | HR 70 | Ht 69.0 in | Wt 215.1 lb

## 2013-08-02 DIAGNOSIS — I10 Essential (primary) hypertension: Secondary | ICD-10-CM | POA: Diagnosis not present

## 2013-08-02 DIAGNOSIS — I739 Peripheral vascular disease, unspecified: Secondary | ICD-10-CM | POA: Insufficient documentation

## 2013-08-02 DIAGNOSIS — R0602 Shortness of breath: Secondary | ICD-10-CM

## 2013-08-02 DIAGNOSIS — I4891 Unspecified atrial fibrillation: Secondary | ICD-10-CM | POA: Diagnosis not present

## 2013-08-02 LAB — BRAIN NATRIURETIC PEPTIDE: PRO B NATRI PEPTIDE: 62 pg/mL (ref 0.0–100.0)

## 2013-08-02 LAB — CBC
HEMATOCRIT: 42.9 % (ref 36.0–46.0)
HEMOGLOBIN: 14.6 g/dL (ref 12.0–15.0)
MCHC: 34 g/dL (ref 30.0–36.0)
MCV: 97.3 fl (ref 78.0–100.0)
PLATELETS: 244 10*3/uL (ref 150.0–400.0)
RBC: 4.41 Mil/uL (ref 3.87–5.11)
RDW: 13 % (ref 11.5–14.6)
WBC: 5.2 10*3/uL (ref 4.5–10.5)

## 2013-08-02 LAB — TSH: TSH: 0.42 u[IU]/mL (ref 0.35–5.50)

## 2013-08-02 LAB — POCT INR: INR: 3

## 2013-08-02 NOTE — Patient Instructions (Signed)
Your physician recommends that you continue on your current medications as directed. Please refer to the Current Medication list given to you today.  Your physician recommends that you go to the lab today for a TSH/CBC/BNP  Your physician has requested that you have an echocardiogram. Echocardiography is a painless test that uses sound waves to create images of your heart. It provides your doctor with information about the size and shape of your heart and how well your heart's chambers and valves are working. This procedure takes approximately one hour. There are no restrictions for this procedure.  Your physician has requested that you have a lower  extremity arterial duplex. This test is an ultrasound of the arteries in the legs. It looks at arterial blood flow in the legs. Allow one hour for Lower  Arterial scans. There are no restrictions or special instructions  Your physician wants you to follow-up in: 6 months with Dr Sherlyn Lick will receive a reminder letter in the mail two months in advance. If you don't receive a letter, please call our office to schedule the follow-up appointment.

## 2013-08-02 NOTE — Telephone Encounter (Signed)
Patient is scheduled to have a colonoscopy on 09/24/13 by Dr. Laural Benes and needs to hold warfarin prior to this.  Any objection to holding warfarin 5 days prior to procedure?  I will see patient 08/29/13 and give her instruction at that time.  Please let me know if you have any objection to holding 5 days prior.  Thanks.

## 2013-08-02 NOTE — Progress Notes (Signed)
[address] [place], [place]  [address] Phone: [phone] Fax:  [phone]  Date:  08/02/2013   ID:  Natasha MurrainJane B Crisanti, DOB 09/22/1932, MRN [id number]  PCP:  Pearla DubonnetGATES,ROBERT NEVILL, MD  Cardiologist:  Armanda Magicraci Hulet Ehrmann, MD     History of Present Illness: Natasha [name] is a 80 y.o. female with a history of PAF, HTN  and chronic systemic anticoagulation who presents today for followup.  She is doing well.  She denies any chest pain, LE edema, dizziness, palpitations or syncope.  Her main complaint is that of fatigue.  She says she does not feel like she has as much energy as she would like to after having the ablation.  She also says that her legs feel weak when she walks.  She has chronic SOB which she thinks has gotten worse with walking and climbing stairs. She has gained some weight the past year over about 20 pounds.  She did have a few episodes palpitations in December and thinks she may have had some PAF.  She is seeing her EP MD next month.    Wt Readings from Last 3 Encounters:  08/02/13 215 lb 1.9 oz (97.578 kg)  03/03/11 195 lb (88.451 kg)  03/03/11 195 lb (88.451 kg)     Past Medical History  Diagnosis Date  . A-fib   . GERD (gastroesophageal reflux disease)   . Hypertension   . Headache(784.0)   . Seasonal allergies   . Lower leg edema   . Hypothyroidism   . Restless leg syndrome   . DJD (degenerative joint disease)   . Diverticular disease   . PONV (postoperative nausea and vomiting)   . Dysphagia   . Shortness of breath   . Pneumonia     history of  . Hiatal hernia   . Chest pain, non-cardiac     Current Outpatient Prescriptions  Medication Sig Dispense Refill  . butalbital-acetaminophen-caffeine (FIORICET WITH CODEINE) 50-325-40-30 MG per capsule Take 1 capsule by mouth every 4 (four) hours as needed. For migraines      . Calcium Citrate-Vitamin D (CITRACAL + D PO) Take 1 tablet by mouth 2 (two) times daily.      Marland Kitchen. estrogens, conjugated, (PREMARIN)  0.625 MG tablet Take 0.625 mg by mouth daily. Take daily for 21 days then do not take for 7 days.       . flecainide (TAMBOCOR) 50 MG tablet Take 50 mg by mouth 2 (two) times daily.        . furosemide (LASIX) 40 MG tablet Take 40 mg by mouth daily.        Marland Kitchen. levothyroxine (SYNTHROID, LEVOTHROID) 150 MCG tablet Take 150 mcg by mouth daily.        Marland Kitchen. LORazepam (ATIVAN) 1 MG tablet Take 0.5-1 mg by mouth at bedtime as needed. For sleep      . Magnesium 400 MG CAPS Take 1 tablet by mouth daily.      . nebivolol (BYSTOLIC) 5 MG tablet Take 5 mg by mouth daily.      . potassium chloride (K-DUR) 10 MEQ tablet Take 10 mEq by mouth daily.        . potassium chloride (K-DUR,KLOR-CON) 10 MEQ tablet take 1 tablet by mouth once daily  30 tablet  6  . Probiotic Product (ALIGN PO) Take 1 tablet by mouth daily.      . Riboflavin (VITAMIN B-2 PO) Take 400 mg by mouth daily.      .Marland Kitchen  rizatriptan (MAXALT) 10 MG tablet Take 10 mg by mouth as needed. May repeat in 2 hours if needed       . warfarin (COUMADIN) 5 MG tablet Take 2.5-5 mg by mouth every other day. Alternates daily with 2.5mg  & 5mg . Due to take 5mg  on 09/29/11.       No current facility-administered medications for this visit.    Allergies:    Allergies  Allergen Reactions  . Tramadol Nausea Only  . Codeine Nausea And Vomiting  . Erythromycin Other (See Comments)    Flushing on face  . Flecainide Itching  . Penicillins Hives  . Tetracyclines & Related Other (See Comments)    "Sores on legs"  . Diltiazem Rash    Social History:  The patient  reports that she has never smoked. She does not have any smokeless tobacco history on file. She reports that she does not drink alcohol or use illicit drugs.   Family History:  The patient's family history includes CVA in her father; Cancer in her brother; Heart failure in her father; Hypertension in her sister; Lung cancer in her father; Transient ischemic attack in her mother. There is no history of  Anesthesia problems, Hypotension, Malignant hyperthermia, or Pseudochol deficiency.   ROS:  Please see the history of present illness.      All other systems reviewed and negative.   PHYSICAL EXAM: VS:  BP 144/85  Pulse 70  Ht 5\' 9"  (1.753 m)  Wt 215 lb 1.9 oz (97.578 kg)  BMI 31.75 kg/m2 Well nourished, well developed, in no acute distress HEENT: normal Neck: no JVD Cardiac:  normal S1, S2; RRR; no murmur Lungs:  clear to auscultation bilaterally, no wheezing, rhonchi or rales Abd: soft, nontender, no hepatomegaly Ext: no edema Skin: warm and dry Neuro:  CNs 2-12 intact, no focal abnormalities noted  EKG: NSR with  No ST changes      ASSESSMENT AND PLAN:  1. PAF maintaining NSR - continue Coumadin/Bystolic 2. Chronic systemic anticoagulation 3. HTN controlled - continue Bystolic 4.  SOB with fatigue since the ablation - check 2D echo to assess LVF - check TSH/CBC/BNP 5.  Leg fatigue with walking with reduced pulses on exam - bilateral LE arterial dopplers   Followup with me in 6 months Signed, Armanda Magic, MD 08/02/2013 11:07 AM

## 2013-08-04 NOTE — Telephone Encounter (Signed)
Ok to hold coumadin for colonoscopy. 

## 2013-08-14 ENCOUNTER — Ambulatory Visit (HOSPITAL_COMMUNITY): Payer: Medicare Other | Attending: Cardiology | Admitting: *Deleted

## 2013-08-14 ENCOUNTER — Ambulatory Visit (HOSPITAL_BASED_OUTPATIENT_CLINIC_OR_DEPARTMENT_OTHER): Payer: Medicare Other | Admitting: *Deleted

## 2013-08-14 DIAGNOSIS — R0602 Shortness of breath: Secondary | ICD-10-CM | POA: Diagnosis not present

## 2013-08-14 DIAGNOSIS — I739 Peripheral vascular disease, unspecified: Secondary | ICD-10-CM

## 2013-08-14 DIAGNOSIS — I4891 Unspecified atrial fibrillation: Secondary | ICD-10-CM | POA: Diagnosis not present

## 2013-08-14 DIAGNOSIS — R0989 Other specified symptoms and signs involving the circulatory and respiratory systems: Secondary | ICD-10-CM

## 2013-08-14 NOTE — Progress Notes (Signed)
Echocardiogram Complete 

## 2013-08-14 NOTE — Progress Notes (Signed)
Arterial Doppler complete.  

## 2013-08-26 DIAGNOSIS — I4891 Unspecified atrial fibrillation: Secondary | ICD-10-CM | POA: Diagnosis not present

## 2013-08-27 DIAGNOSIS — R0989 Other specified symptoms and signs involving the circulatory and respiratory systems: Secondary | ICD-10-CM | POA: Insufficient documentation

## 2013-08-29 ENCOUNTER — Ambulatory Visit (INDEPENDENT_AMBULATORY_CARE_PROVIDER_SITE_OTHER): Payer: Medicare Other | Admitting: Pharmacist

## 2013-08-29 DIAGNOSIS — I4891 Unspecified atrial fibrillation: Secondary | ICD-10-CM | POA: Diagnosis not present

## 2013-08-29 LAB — POCT INR: INR: 3.2

## 2013-09-02 DIAGNOSIS — R0989 Other specified symptoms and signs involving the circulatory and respiratory systems: Secondary | ICD-10-CM | POA: Diagnosis not present

## 2013-09-03 ENCOUNTER — Encounter (HOSPITAL_COMMUNITY): Payer: Self-pay | Admitting: Pharmacy Technician

## 2013-09-06 ENCOUNTER — Encounter (HOSPITAL_COMMUNITY): Payer: Self-pay | Admitting: *Deleted

## 2013-09-06 HISTORY — PX: ATRIAL FIBRILLATION ABLATION: SHX5732

## 2013-09-13 ENCOUNTER — Other Ambulatory Visit: Payer: Self-pay | Admitting: Cardiology

## 2013-09-24 ENCOUNTER — Ambulatory Visit (HOSPITAL_COMMUNITY): Payer: Medicare Other | Admitting: Anesthesiology

## 2013-09-24 ENCOUNTER — Encounter (HOSPITAL_COMMUNITY): Payer: Self-pay | Admitting: *Deleted

## 2013-09-24 ENCOUNTER — Encounter (HOSPITAL_COMMUNITY): Payer: Medicare Other | Admitting: Anesthesiology

## 2013-09-24 ENCOUNTER — Encounter (HOSPITAL_COMMUNITY)
Admission: RE | Disposition: A | Discharge status: Home / Self Care | Payer: Medicare Other | Source: Ambulatory Visit | Attending: Gastroenterology

## 2013-09-24 ENCOUNTER — Ambulatory Visit (HOSPITAL_COMMUNITY)
Admission: RE | Admit: 2013-09-24 | Discharge: 2013-09-24 | Disposition: A | Discharge status: Home / Self Care | Payer: Medicare Other | Source: Ambulatory Visit | Attending: Gastroenterology | Admitting: Gastroenterology

## 2013-09-24 DIAGNOSIS — K219 Gastro-esophageal reflux disease without esophagitis: Secondary | ICD-10-CM | POA: Insufficient documentation

## 2013-09-24 DIAGNOSIS — Z1211 Encounter for screening for malignant neoplasm of colon: Secondary | ICD-10-CM | POA: Insufficient documentation

## 2013-09-24 DIAGNOSIS — K449 Diaphragmatic hernia without obstruction or gangrene: Secondary | ICD-10-CM | POA: Diagnosis not present

## 2013-09-24 DIAGNOSIS — G2581 Restless legs syndrome: Secondary | ICD-10-CM | POA: Insufficient documentation

## 2013-09-24 DIAGNOSIS — I10 Essential (primary) hypertension: Secondary | ICD-10-CM | POA: Insufficient documentation

## 2013-09-24 DIAGNOSIS — E039 Hypothyroidism, unspecified: Secondary | ICD-10-CM | POA: Diagnosis not present

## 2013-09-24 DIAGNOSIS — I4891 Unspecified atrial fibrillation: Secondary | ICD-10-CM | POA: Insufficient documentation

## 2013-09-24 HISTORY — PX: COLONOSCOPY WITH PROPOFOL: SHX5780

## 2013-09-24 HISTORY — DX: Cardiac arrhythmia, unspecified: I49.9

## 2013-09-24 SURGERY — COLONOSCOPY WITH PROPOFOL
Anesthesia: Monitor Anesthesia Care

## 2013-09-24 MED ORDER — LACTATED RINGERS IV SOLN
INTRAVENOUS | Status: DC
  Administered 2013-09-24: 1000 mL via INTRAVENOUS

## 2013-09-24 MED ORDER — PROPOFOL 10 MG/ML IV BOLUS
INTRAVENOUS | Status: AC
  Filled 2013-09-24: qty 20

## 2013-09-24 MED ORDER — PROPOFOL INFUSION 10 MG/ML OPTIME
INTRAVENOUS | Status: DC | PRN
  Administered 2013-09-24: 100 ug/kg/min via INTRAVENOUS

## 2013-09-24 MED ORDER — FENTANYL CITRATE 0.05 MG/ML IJ SOLN: 25.0000 ug | INTRAMUSCULAR | Status: DC | PRN

## 2013-09-24 MED ORDER — LACTATED RINGERS IV SOLN
INTRAVENOUS | Status: DC | PRN
  Administered 2013-09-24: 12:00:00 via INTRAVENOUS

## 2013-09-24 SURGICAL SUPPLY — 22 items

## 2013-09-24 NOTE — Transfer of Care (Signed)
Immediate Anesthesia Transfer of Care Note  Patient: Natasha [name]  Procedure(s) Performed: Procedure(s): COLONOSCOPY WITH PROPOFOL (N/A)  Patient Location: PACU  Anesthesia Type:MAC  Level of Consciousness: awake and sedated  Airway & Oxygen Therapy: Patient Spontanous Breathing and Patient connected to face mask oxygen  Post-op Assessment: Report given to PACU RN and Post -op Vital signs reviewed and stable  Post vital signs: Reviewed and stable  Complications: No apparent anesthesia complications

## 2013-09-24 NOTE — Discharge Instructions (Signed)
Gastroscopy Gastroscopy is a procedure that allows your caregiver to look at your stomach and duodenum. The duodenum is the first part of the small intestine. This procedure is used to find and diagnose problems such as:  Gastric and peptic sores (ulcers).  Cancer. LET YOUR CAREGIVER KNOW ABOUT:  Allergies to food or medicine.  Medicines taken, including vitamins, herbs, eyedrops, over-the-counter medicines, and creams.  Use of steroids (by mouth or creams).  Previous problems with anesthetics or numbing medicines.  History of bleeding problems or blood clots.  Previous surgery.  Other health problems, including diabetes and kidney problems.  Possibility of pregnancy, if this applies. RISKS AND COMPLICATIONS Problems do not occur often with this procedure, but they are possible. Possible problems include:  Infection.  Bleeding.  Damage to the gastrointestinal tract. BEFORE THE PROCEDURE  Do not eat for 8 hours before the procedure, or as instructed by your caregiver.  You may drink clear beverages up to 3 hours before the procedure. Ask your caregiver if it is okay to take any needed medicines with a sip of water in the 3 hours before the procedure.  If you have diabetes and need to take insulin, ask for instructions on how to do this before the procedure. PROCEDURE  Gastroscopy is done while you are awake. You will not need pain medicine. The procedure usually takes about 1 hour.   Your throat may be sprayed with medicine that numbs the area (local anesthetic).  You may be given medicine to help you relax (sedative).  You will lie on your side.  A thin, lighted tube (gastroscope) will be inserted through your mouth and down the esophagus. A camera is attached to the gastroscope that allows your caregiver to see inside the stomach.  A tissue sample (biopsy sample) may be taken during the procedure. AFTER THE PROCEDURE  When you are awake, feeling well, and taking  fluids well, you may be allowed to go home.  If you were given a sedative, you will be taken to a recovery area. A nurse will watch and check your progress.  If you were given a sedative, have someone to drive you home. Document Released: 04/01/2000 Document Revised: 10/04/2011 Document Reviewed: 06/27/2011 [facility] Patient Information 2014 [place]. Colonoscopy A colonoscopy is an exam to look at the entire large intestine (colon). This exam can help find problems such as tumors, polyps, inflammation, and areas of bleeding. The exam takes about 1 hour.  LET [facility] LLC[facility]OUR HEALTH CARE PROVIDER KNOW ABOUT:   Any allergies you have.  All medicines you are taking, including vitamins, herbs, eye drops, creams, and over-the-counter medicines.  Previous problems you or members of your family have had with the use of anesthetics.  Any blood disorders you have.  Previous surgeries you have had.  Medical conditions you have. RISKS AND COMPLICATIONS  Generally, this is a safe procedure. However, as with any procedure, complications can occur. Possible complications include:  Bleeding.  Tearing or rupture of the colon wall.  Reaction to medicines given during the exam.  Infection (rare). BEFORE THE PROCEDURE   Ask your health care provider about changing or stopping your regular medicines.  You may be prescribed an oral bowel prep. This involves drinking a large amount of medicated liquid, starting the day before your procedure. The liquid will cause you to have multiple loose stools until your stool is almost clear or light green. This cleans out your colon in preparation for the procedure.  Do not eat  or drink anything else once you have started the bowel prep, unless your health care provider tells you it is safe to do so.  Arrange for someone to drive you home after the procedure. PROCEDURE   You will be given medicine to help you relax (sedative).  You will lie on your side with  your knees bent.  A long, flexible tube with a light and camera on the end (colonoscope) will be inserted through the rectum and into the colon. The camera sends video back to a computer screen as it moves through the colon. The colonoscope also releases carbon dioxide gas to inflate the colon. This helps your health care provider see the area better.  During the exam, your health care provider may take a small tissue sample (biopsy) to be examined under a microscope if any abnormalities are found.  The exam is finished when the entire colon has been viewed. AFTER THE PROCEDURE   Do not drive for 24 hours after the exam.  You may have a small amount of blood in your stool.  You may pass moderate amounts of gas and have mild abdominal cramping or bloating. This is caused by the gas used to inflate your colon during the exam.  Ask when your test results will be ready and how you will get your results. Make sure you get your test results. Document Released: 04/01/2000 Document Revised: 01/23/2013 Document Reviewed: 12/10/2012 [facility] Patient Information 2014 [place], [place]. Monitored Anesthesia Care  Monitored anesthesia care is an anesthesia service for a medical procedure. Anesthesia is the loss of the ability to feel pain. It is produced by medications called anesthetics. It may affect a small area of your body (local anesthesia), a large area of your body (regional anesthesia), or your entire body (general anesthesia). The need for monitored anesthesia care depends your procedure, your condition, and the potential need for regional or general anesthesia. It is often provided during procedures where:   General anesthesia may be needed if there are complications. This is because you need special care when you are under general anesthesia.   You will be under local or regional anesthesia. This is so that you are able to have higher levels of anesthesia if needed.   You will receive calming  medications (sedatives). This is especially the case if sedatives are given to put you in a semi-conscious state of relaxation (deep sedation). This is because the amount of sedative needed to produce this state can be hard to predict. Too much of a sedative can produce general anesthesia. Monitored anesthesia care is performed by one or more caregivers who have special training in all types of anesthesia. You will need to meet with these caregivers before your procedure. During this meeting, they will ask you about your medical history. They will also give you instructions to follow. (For example, you will need to stop eating and drinking before your procedure. You may also need to stop or change medications you are taking.) During your procedure, your caregivers will stay with you. They will:   Watch your condition. This includes watching you blood pressure, breathing, and level of pain.   Diagnose and treat problems that occur.   Give medications if they are needed. These may include calming medications (sedatives) and anesthetics.   Make sure you are comfortable.  Having monitored anesthesia care does not necessarily mean that you will be under anesthesia. It does mean that your caregivers will be able to manage anesthesia if you  need it or if it occurs. It also means that you will be able to have a different type of anesthesia than you are having if you need it. When your procedure is complete, your caregivers will continue to watch your condition. They will make sure any medications wear off before you are allowed to go home.  Document Released: 12/29/2004 Document Revised: 07/30/2012 Document Reviewed: 05/16/2012 [facility] Patient Information 2014 [place], [place].

## 2013-09-24 NOTE — H&P (Signed)
  Procedure: Screening colonoscopy. Normal screening colonoscopy performed 03/20/2002  History: The patient is a 80 year old female born 1933/04/04. She is scheduled to undergo a repeat screening colonoscopy with polypectomy to prevent colon cancer. She stopped taking Coumadin 5 days prior to today's procedure.  Past medical history: Paroxysmal atrial fibrillation. Gastroesophageal reflux complicated by a benign distal esophageal stricture. Migraine headache syndrome. Seasonal allergies. Hypothyroidism. Restless leg syndrome. Osteoarthritis. Total abdominal hysterectomy. Bilateral salpingo-oophorectomy. Appendectomy. Tubal ligation. Cataract surgery.  Allergies: Diltiazem causes leg rash. Penicillin. Codeine.  Exam: The patient is alert and lying comfortably on the endoscopy stretcher. Abdomen is soft and nontender to palpation. Lungs are clear to auscultation. Cardiac exam reveals a regular rhythm.  Plan: Proceed with screening colonoscopy

## 2013-09-24 NOTE — Anesthesia Postprocedure Evaluation (Signed)
  Anesthesia Post-op Note  Patient: Natasha [name]  Procedure(s) Performed: Procedure(s) (LRB): COLONOSCOPY WITH PROPOFOL (N/A)  Patient Location: PACU  Anesthesia Type: MAC  Level of Consciousness: awake and alert   Airway and Oxygen Therapy: Patient Spontanous Breathing  Post-op Pain: mild  Post-op Assessment: Post-op Vital signs reviewed, Patient's Cardiovascular Status Stable, Respiratory Function Stable, Patent Airway and No signs of Nausea or vomiting  Last Vitals:  Filed Vitals:   09/24/13 1328  BP: 180/59  Pulse: 65  Temp:   Resp: 24    Post-op Vital Signs: stable   Complications: No apparent anesthesia complications

## 2013-09-24 NOTE — Anesthesia Preprocedure Evaluation (Addendum)
Anesthesia Evaluation  Patient identified by MRN, date of birth, ID band Patient awake    Reviewed: Allergy & Precautions, H&P , NPO status , Patient's Chart, lab work & pertinent test results, reviewed documented beta blocker date and time   History of Anesthesia Complications (+) PONV  Airway Mallampati: II TM Distance: >3 FB Neck ROM: full    Dental  (+) Caps, Dental Advisory Given, Implants All front teeth are capped or implant:   Pulmonary shortness of breath and with exertion,  breath sounds clear to auscultation  Pulmonary exam normal       Cardiovascular Exercise Tolerance: Good hypertension, Pt. on home beta blockers and Pt. on medications + dysrhythmias Atrial Fibrillation Rhythm:regular Rate:Normal  S/p ablation. Still has AF intermittently. Non-cardiac CP. claudication   Neuro/Psych  Headaches, negative neurological ROS  negative psych ROS   GI/Hepatic negative GI ROS, Neg liver ROS, hiatal hernia, GERD-  Medicated and Controlled,dysphagia   Endo/Other  negative endocrine ROSHypothyroidism   Renal/GU negative Renal ROS  negative genitourinary   Musculoskeletal   Abdominal   Peds  Hematology negative hematology ROS (+)   Anesthesia Other Findings   Reproductive/Obstetrics negative OB ROS                          Anesthesia Physical Anesthesia Plan  ASA: III  Anesthesia Plan: General and MAC   Post-op Pain Management:    Induction:   Airway Management Planned:   Additional Equipment:   Intra-op Plan:   Post-operative Plan:   Informed Consent: I have reviewed the patients History and Physical, chart, labs and discussed the procedure including the risks, benefits and alternatives for the proposed anesthesia with the patient or authorized representative who has indicated his/her understanding and acceptance.   Dental Advisory Given  Plan Discussed with: CRNA and  Surgeon  Anesthesia Plan Comments:         Anesthesia Quick Evaluation

## 2013-09-24 NOTE — Op Note (Signed)
Procedure: Screening colonoscopy. Normal screening colonoscopy performed 03/20/2002  Endoscopist: Danise Edge  Premedication: Propofol administered by anesthesia  Procedure: The patient was placed in the left lateral decubitus position. Anal inspection and digital rectal exam were normal. The Pentax pediatric colonoscope was introduced into the rectum and advanced to the cecum. A normal-appearing appendiceal orifice and ileocecal valve were identified. Colonic preparation for the exam today was good.  Rectum. Normal. Retroflexed view of the distal rectum normal  Sigmoid colon and descending colon. Normal  Splenic flexure. Normal  Transverse colon. Normal  Hepatic flexure. Normal.  Ascending colon. Normal  Cecum and ileocecal valve. Normal  Assessment: Normal screening colonoscopy  Recommendation: Repeat screening colonoscopy is not recommended

## 2013-09-25 ENCOUNTER — Encounter (HOSPITAL_COMMUNITY): Payer: Self-pay | Admitting: Gastroenterology

## 2013-09-30 NOTE — Progress Notes (Signed)
During post-op phone call, pt complained of hoarsness that has lingered since procedure last Tuesday. Pt has tried gargling salt water and "all kinds of things", but hoarseness still persists. Pt reports no other symptoms. Advised pt to call Dr. Henriette Combs office concerning lingered hoarseness. Pt verbalized understanding and said that she would call tomorrow. Dannielle Huh

## 2013-10-03 DIAGNOSIS — R490 Dysphonia: Secondary | ICD-10-CM | POA: Diagnosis not present

## 2013-10-04 ENCOUNTER — Ambulatory Visit (INDEPENDENT_AMBULATORY_CARE_PROVIDER_SITE_OTHER): Payer: Medicare Other | Admitting: Pharmacist

## 2013-10-04 DIAGNOSIS — I4891 Unspecified atrial fibrillation: Secondary | ICD-10-CM | POA: Diagnosis not present

## 2013-10-04 LAB — POCT INR: INR: 2.8

## 2013-10-08 ENCOUNTER — Encounter: Payer: Self-pay | Admitting: *Deleted

## 2013-10-10 DIAGNOSIS — R490 Dysphonia: Secondary | ICD-10-CM | POA: Diagnosis not present

## 2013-10-10 DIAGNOSIS — K219 Gastro-esophageal reflux disease without esophagitis: Secondary | ICD-10-CM | POA: Diagnosis not present

## 2013-10-22 ENCOUNTER — Ambulatory Visit
Admission: RE | Admit: 2013-10-22 | Discharge: 2013-10-22 | Disposition: A | Discharge status: Home / Self Care | Payer: Medicare Other | Source: Ambulatory Visit | Attending: Internal Medicine | Admitting: Internal Medicine

## 2013-10-22 ENCOUNTER — Other Ambulatory Visit: Payer: Self-pay | Admitting: Internal Medicine

## 2013-10-22 DIAGNOSIS — M2550 Pain in unspecified joint: Secondary | ICD-10-CM | POA: Diagnosis not present

## 2013-10-22 DIAGNOSIS — I4891 Unspecified atrial fibrillation: Secondary | ICD-10-CM | POA: Diagnosis not present

## 2013-10-22 DIAGNOSIS — E559 Vitamin D deficiency, unspecified: Secondary | ICD-10-CM | POA: Diagnosis not present

## 2013-10-22 DIAGNOSIS — R1011 Right upper quadrant pain: Secondary | ICD-10-CM

## 2013-10-22 DIAGNOSIS — E063 Autoimmune thyroiditis: Secondary | ICD-10-CM | POA: Diagnosis not present

## 2013-10-22 DIAGNOSIS — K219 Gastro-esophageal reflux disease without esophagitis: Secondary | ICD-10-CM | POA: Diagnosis not present

## 2013-10-22 DIAGNOSIS — IMO0001 Reserved for inherently not codable concepts without codable children: Secondary | ICD-10-CM | POA: Diagnosis not present

## 2013-10-22 DIAGNOSIS — I10 Essential (primary) hypertension: Secondary | ICD-10-CM | POA: Diagnosis not present

## 2013-10-22 DIAGNOSIS — R490 Dysphonia: Secondary | ICD-10-CM | POA: Diagnosis not present

## 2013-10-22 DIAGNOSIS — Z0000 Encounter for general adult medical examination without abnormal findings: Secondary | ICD-10-CM | POA: Diagnosis not present

## 2013-10-22 DIAGNOSIS — R143 Flatulence: Secondary | ICD-10-CM | POA: Diagnosis not present

## 2013-10-22 DIAGNOSIS — R141 Gas pain: Secondary | ICD-10-CM | POA: Diagnosis not present

## 2013-10-25 ENCOUNTER — Ambulatory Visit (INDEPENDENT_AMBULATORY_CARE_PROVIDER_SITE_OTHER): Payer: Medicare Other | Admitting: Pharmacist

## 2013-10-25 DIAGNOSIS — H5200 Hypermetropia, unspecified eye: Secondary | ICD-10-CM | POA: Diagnosis not present

## 2013-10-25 DIAGNOSIS — Z961 Presence of intraocular lens: Secondary | ICD-10-CM | POA: Diagnosis not present

## 2013-10-25 DIAGNOSIS — H52229 Regular astigmatism, unspecified eye: Secondary | ICD-10-CM | POA: Diagnosis not present

## 2013-10-25 DIAGNOSIS — I4891 Unspecified atrial fibrillation: Secondary | ICD-10-CM

## 2013-10-25 DIAGNOSIS — Z9849 Cataract extraction status, unspecified eye: Secondary | ICD-10-CM | POA: Diagnosis not present

## 2013-10-25 LAB — POCT INR: INR: 2.1

## 2013-11-11 DIAGNOSIS — E039 Hypothyroidism, unspecified: Secondary | ICD-10-CM | POA: Diagnosis not present

## 2013-11-13 DIAGNOSIS — E063 Autoimmune thyroiditis: Secondary | ICD-10-CM | POA: Diagnosis not present

## 2013-11-13 DIAGNOSIS — E039 Hypothyroidism, unspecified: Secondary | ICD-10-CM | POA: Diagnosis not present

## 2013-11-22 ENCOUNTER — Ambulatory Visit (INDEPENDENT_AMBULATORY_CARE_PROVIDER_SITE_OTHER): Payer: Medicare Other | Admitting: Pharmacist

## 2013-11-22 DIAGNOSIS — I4891 Unspecified atrial fibrillation: Secondary | ICD-10-CM | POA: Diagnosis not present

## 2013-11-22 LAB — POCT INR: INR: 2.8

## 2013-12-03 DIAGNOSIS — R490 Dysphonia: Secondary | ICD-10-CM | POA: Diagnosis not present

## 2013-12-03 DIAGNOSIS — E063 Autoimmune thyroiditis: Secondary | ICD-10-CM | POA: Diagnosis not present

## 2013-12-03 DIAGNOSIS — G9332 Myalgic encephalomyelitis/chronic fatigue syndrome: Secondary | ICD-10-CM | POA: Diagnosis not present

## 2013-12-03 DIAGNOSIS — R1011 Right upper quadrant pain: Secondary | ICD-10-CM | POA: Diagnosis not present

## 2013-12-03 DIAGNOSIS — K219 Gastro-esophageal reflux disease without esophagitis: Secondary | ICD-10-CM | POA: Diagnosis not present

## 2013-12-03 DIAGNOSIS — E559 Vitamin D deficiency, unspecified: Secondary | ICD-10-CM | POA: Diagnosis not present

## 2013-12-03 DIAGNOSIS — I10 Essential (primary) hypertension: Secondary | ICD-10-CM | POA: Diagnosis not present

## 2013-12-03 DIAGNOSIS — I4891 Unspecified atrial fibrillation: Secondary | ICD-10-CM | POA: Diagnosis not present

## 2013-12-03 DIAGNOSIS — R5382 Chronic fatigue, unspecified: Secondary | ICD-10-CM | POA: Diagnosis not present

## 2013-12-06 ENCOUNTER — Observation Stay (HOSPITAL_COMMUNITY)
Admission: EM | Admit: 2013-12-06 | Discharge: 2013-12-07 | Disposition: A | Discharge status: Home / Self Care | Payer: Medicare Other | Attending: Cardiology | Admitting: Cardiology

## 2013-12-06 ENCOUNTER — Emergency Department (HOSPITAL_COMMUNITY): Payer: Medicare Other

## 2013-12-06 ENCOUNTER — Encounter (HOSPITAL_COMMUNITY): Payer: Self-pay | Admitting: Emergency Medicine

## 2013-12-06 DIAGNOSIS — Z9089 Acquired absence of other organs: Secondary | ICD-10-CM | POA: Insufficient documentation

## 2013-12-06 DIAGNOSIS — Z881 Allergy status to other antibiotic agents status: Secondary | ICD-10-CM | POA: Insufficient documentation

## 2013-12-06 DIAGNOSIS — I509 Heart failure, unspecified: Secondary | ICD-10-CM | POA: Diagnosis not present

## 2013-12-06 DIAGNOSIS — E039 Hypothyroidism, unspecified: Secondary | ICD-10-CM | POA: Diagnosis not present

## 2013-12-06 DIAGNOSIS — R0602 Shortness of breath: Secondary | ICD-10-CM | POA: Diagnosis not present

## 2013-12-06 DIAGNOSIS — Z9229 Personal history of other drug therapy: Secondary | ICD-10-CM

## 2013-12-06 DIAGNOSIS — Z90710 Acquired absence of both cervix and uterus: Secondary | ICD-10-CM | POA: Diagnosis not present

## 2013-12-06 DIAGNOSIS — Z9849 Cataract extraction status, unspecified eye: Secondary | ICD-10-CM | POA: Diagnosis not present

## 2013-12-06 DIAGNOSIS — D72829 Elevated white blood cell count, unspecified: Secondary | ICD-10-CM | POA: Insufficient documentation

## 2013-12-06 DIAGNOSIS — I4819 Other persistent atrial fibrillation: Secondary | ICD-10-CM | POA: Diagnosis present

## 2013-12-06 DIAGNOSIS — I4891 Unspecified atrial fibrillation: Principal | ICD-10-CM | POA: Insufficient documentation

## 2013-12-06 DIAGNOSIS — I10 Essential (primary) hypertension: Secondary | ICD-10-CM | POA: Diagnosis not present

## 2013-12-06 DIAGNOSIS — Z880 Allergy status to penicillin: Secondary | ICD-10-CM | POA: Diagnosis not present

## 2013-12-06 DIAGNOSIS — K449 Diaphragmatic hernia without obstruction or gangrene: Secondary | ICD-10-CM | POA: Diagnosis present

## 2013-12-06 DIAGNOSIS — Z7901 Long term (current) use of anticoagulants: Secondary | ICD-10-CM | POA: Insufficient documentation

## 2013-12-06 DIAGNOSIS — K219 Gastro-esophageal reflux disease without esophagitis: Secondary | ICD-10-CM | POA: Diagnosis not present

## 2013-12-06 DIAGNOSIS — R609 Edema, unspecified: Secondary | ICD-10-CM | POA: Insufficient documentation

## 2013-12-06 DIAGNOSIS — M1990 Unspecified osteoarthritis, unspecified site: Secondary | ICD-10-CM | POA: Diagnosis not present

## 2013-12-06 DIAGNOSIS — Z885 Allergy status to narcotic agent status: Secondary | ICD-10-CM | POA: Diagnosis not present

## 2013-12-06 DIAGNOSIS — Z79899 Other long term (current) drug therapy: Secondary | ICD-10-CM | POA: Diagnosis not present

## 2013-12-06 DIAGNOSIS — Z888 Allergy status to other drugs, medicaments and biological substances status: Secondary | ICD-10-CM | POA: Diagnosis not present

## 2013-12-06 DIAGNOSIS — G2581 Restless legs syndrome: Secondary | ICD-10-CM | POA: Insufficient documentation

## 2013-12-06 HISTORY — DX: Paroxysmal atrial fibrillation: I48.0

## 2013-12-06 LAB — CBC WITH DIFFERENTIAL/PLATELET
Basophils Absolute: 0 10*3/uL (ref 0.0–0.1)
Basophils Relative: 0 % (ref 0–1)
EOS PCT: 0 % (ref 0–5)
Eosinophils Absolute: 0 10*3/uL (ref 0.0–0.7)
HEMATOCRIT: 44.5 % (ref 36.0–46.0)
HEMOGLOBIN: 15.5 g/dL — AB (ref 12.0–15.0)
LYMPHS ABS: 1.8 10*3/uL (ref 0.7–4.0)
LYMPHS PCT: 11 % — AB (ref 12–46)
MCH: 31.6 pg (ref 26.0–34.0)
MCHC: 34.8 g/dL (ref 30.0–36.0)
MCV: 90.8 fL (ref 78.0–100.0)
MONO ABS: 1.9 10*3/uL — AB (ref 0.1–1.0)
Monocytes Relative: 11 % (ref 3–12)
NEUTROS ABS: 12.5 10*3/uL — AB (ref 1.7–7.7)
Neutrophils Relative %: 78 % — ABNORMAL HIGH (ref 43–77)
Platelets: 225 10*3/uL (ref 150–400)
RBC: 4.9 MIL/uL (ref 3.87–5.11)
RDW: 13.8 % (ref 11.5–15.5)
WBC: 16.2 10*3/uL — AB (ref 4.0–10.5)

## 2013-12-06 LAB — TROPONIN I: Troponin I: <0.3 ng/mL (ref <0.30)

## 2013-12-06 LAB — COMPREHENSIVE METABOLIC PANEL
ALBUMIN: 3.7 g/dL (ref 3.5–5.2)
ALT: 16 U/L (ref 0–35)
ANION GAP: 5 (ref 5–15)
AST: 22 U/L (ref 0–37)
Alkaline Phosphatase: 66 U/L (ref 39–117)
BUN: 13 mg/dL (ref 6–23)
CO2: 23 mEq/L (ref 19–32)
CREATININE: 0.82 mg/dL (ref 0.50–1.10)
Calcium: 9 mg/dL (ref 8.4–10.5)
Chloride: 105 mEq/L (ref 96–112)
GFR calc Af Amer: 77 mL/min — ABNORMAL LOW (ref >90)
GFR calc non Af Amer: 66 mL/min — ABNORMAL LOW (ref >90)
Glucose, Bld: 153 mg/dL — ABNORMAL HIGH (ref 70–99)
Potassium: 3.9 mEq/L (ref 3.7–5.3)
Sodium: 133 mEq/L — ABNORMAL LOW (ref 137–147)
TOTAL PROTEIN: 7.5 g/dL (ref 6.0–8.3)
Total Bilirubin: 0.3 mg/dL (ref 0.3–1.2)

## 2013-12-06 LAB — MAGNESIUM: MAGNESIUM: 1.8 mg/dL (ref 1.5–2.5)

## 2013-12-06 MED ORDER — SODIUM CHLORIDE 0.9 % IV BOLUS (SEPSIS)
1000.0000 mL | Freq: Once | INTRAVENOUS | Status: AC
  Administered 2013-12-06: 1000 mL via INTRAVENOUS

## 2013-12-06 MED ORDER — POTASSIUM CHLORIDE ER 10 MEQ PO TBCR
20.0000 meq | EXTENDED_RELEASE_TABLET | Freq: Once | ORAL | Status: DC
  Filled 2013-12-06: qty 2

## 2013-12-06 MED ORDER — MAGNESIUM OXIDE 400 (241.3 MG) MG PO TABS
400.0000 mg | ORAL_TABLET | Freq: Once | ORAL | Status: DC
  Filled 2013-12-06: qty 1

## 2013-12-06 MED ORDER — DILTIAZEM HCL 25 MG/5ML IV SOLN: 20.0000 mg | Freq: Once | INTRAVENOUS | Status: AC

## 2013-12-06 MED ORDER — DILTIAZEM HCL 100 MG IV SOLR
5.0000 mg/h | Freq: Once | INTRAVENOUS | Status: AC
  Administered 2013-12-06: 20 mg/h via INTRAVENOUS

## 2013-12-06 MED ORDER — ESMOLOL HCL-SODIUM CHLORIDE 2000 MG/100ML IV SOLN: 25.0000 ug/kg/min | Freq: Once | INTRAVENOUS | Status: DC

## 2013-12-06 MED ORDER — DILTIAZEM HCL 30 MG PO TABS
30.0000 mg | ORAL_TABLET | Freq: Four times a day (QID) | ORAL | Status: DC
  Administered 2013-12-07: 30 mg via ORAL
  Filled 2013-12-06 (×6): qty 1

## 2013-12-06 NOTE — H&P (Signed)
Admission History and Physical     Patient ID: Natasha [name], MRN: [id number], DOB: 06/13/1932 80 y.o. Date of Encounter: 12/06/2013, 11:49 PM  Primary Physician: Pearla DubonnetGATES,ROBERT NEVILL, MD Primary Cardiologist: Dr. Mayford Knifeurner Primary Electrophysiologist:  Dr. Macon LargeBahnson ([facility])  Chief Complaint: AFib with RVR  History of Present Illness: Natasha [name] is a 80 y.o. female with a history of atrial fibrillation, s/p ablation last year by Dr. Macon LargeBahnson, who has continued to have PAF who presented in AFib w RVR.  This episode started today at 12:30 while sitting down. Associated with neck fullness, and chest pressure radiating down left arm.  She took 200mg  of flecainide (pill-in-pocket) which did not convert her (this has worked in the past).   On arrival to the ED she was in AFib with RVR, rate in the 160s.  Her rate responded well to diltiazem gtt at 5, however she remains in sinus rhythm.   Until this event today she has been in her usual state of health. Very active with volunteer work, no SOB, CP, stable mild edema.     Past Medical History  Diagnosis Date  . A-fib   . GERD (gastroesophageal reflux disease)   . Hypertension   . Headache(784.0)   . Seasonal allergies   . Lower leg edema   . Hypothyroidism   . Restless leg syndrome   . DJD (degenerative joint disease)   . Diverticular disease   . PONV (postoperative nausea and vomiting)   . Dysphagia   . Shortness of breath   . Pneumonia     history of  . Hiatal hernia   . Chest pain, non-cardiac   . Dysrhythmia     hx. Atrial fib-ablation done, now intermittently     Past Surgical History  Procedure Laterality Date  . Rhinoplasty    . Abdominal hysterectomy    . Appendectomy    . Tubal ligation    . Eye surgery      cataracat  . Cardioversion      x3  . Breast surgery      left lumpectomy  . Tonsillectomy  1942  . Thyroidectomy  1973  . Bladder polyps    . Rotator cuff repair    . Esophagogastroduodenoscopy  03/03/2011     Procedure: ESOPHAGOGASTRODUODENOSCOPY (EGD);  Surgeon: Charolett BumpersMartin K Johnson, MD;  [facility];  Service: Endoscopy;  Laterality: N/A;  . Balloon dilation  03/03/2011    Procedure: BALLOON DILATION;  Surgeon: Charolett BumpersMartin K Johnson, MD;  Location: [facility];  Service: Endoscopy;  Laterality: N/A;  . Atrial fibrillation ablation  09-06-13    2'14 -[facility]  . Cataract extraction Left   . Colonoscopy with propofol N/A 09/24/2013    Procedure: COLONOSCOPY WITH PROPOFOL;  Surgeon: Charolett BumpersMartin K Johnson, MD;  Location: [facility];  Service: Endoscopy;  Laterality: N/A;      Current Facility-Administered Medications  Medication Dose Route Frequency Provider Last Rate Last Dose  . [START ON 12/07/2013] diltiazem (CARDIZEM) tablet 30 mg  30 mg Oral 4 times per day Yaakov GuthrieJoseph Denisia Harpole, MD       Current Outpatient Prescriptions  Medication Sig Dispense Refill  . acetaminophen (TYLENOL) 500 MG tablet Take 500 mg by mouth every 6 (six) hours as needed for mild pain or headache.      . butalbital-acetaminophen-caffeine (FIORICET WITH CODEINE) 50-325-40-30 MG per capsule Take 1 capsule by mouth every 4 (four) hours as needed. For migraines      . Calcium Citrate-Vitamin D (  CITRACAL + D PO) Take 1 tablet by mouth 2 (two) times daily.      . cholecalciferol (VITAMIN D) 1000 UNITS tablet Take 1,000 Units by mouth daily.      Marland Kitchen esomeprazole (NEXIUM) 20 MG capsule Take 20 mg by mouth daily at 12 noon.      Marland Kitchen estradiol (ESTRACE) 1 MG tablet Take 1 mg by mouth daily.      . famotidine (PEPCID AC) 10 MG chewable tablet Chew 10 mg by mouth 2 (two) times daily.      . furosemide (LASIX) 40 MG tablet Take 40 mg by mouth every morning.       . hydroxypropyl methylcellulose (ISOPTO TEARS) 2.5 % ophthalmic solution Place 1 drop into both eyes 3 (three) times daily as needed for dry eyes.      Marland Kitchen levothyroxine (SYNTHROID, LEVOTHROID) 150 MCG tablet Take 150 mcg by mouth daily before breakfast.       . LORazepam (ATIVAN)  1 MG tablet Take 0.5-1 mg by mouth at bedtime as needed. For sleep      . Magnesium 500 MG CAPS Take 500 mg by mouth daily.      . nebivolol (BYSTOLIC) 5 MG tablet Take 5 mg by mouth every evening.       Marland Kitchen OVER THE COUNTER MEDICATION Take 1 tablet by mouth daily. Medication: Florajen      . potassium chloride (K-DUR) 10 MEQ tablet Take 10 mEq by mouth daily.      Marland Kitchen warfarin (COUMADIN) 5 MG tablet Take 2.5-5 mg by mouth every other day. 5 mg Sunday, Tuesday, Thursday, Saturday. 2.5mg  Monday, Wednesday and Friday          Allergies: Allergies  Allergen Reactions  . Tramadol Nausea Only  . Codeine Nausea And Vomiting and Nausea Only  . Erythromycin Other (See Comments) and Hives    Flushing on face  . Penicillins Hives  . Tetracyclines & Related Other (See Comments)    "Sores on legs"  . Diltiazem Rash    Rash with long acting form  . Tetracycline Rash     Social History:  The patient  reports that she has never smoked. She does not have any smokeless tobacco history on file. She reports that she does not drink alcohol or use illicit drugs.   Family History:  The patient's family history includes CVA in her father; Cancer in her brother; Heart failure in her father; Hypertension in her sister; Lung cancer in her father; Transient ischemic attack in her mother. There is no history of Anesthesia problems, Hypotension, Malignant hyperthermia, or Pseudochol deficiency.   ROS:  Please see the history of present illness.    All other systems reviewed and negative.   Vital Signs: Blood pressure 109/70, pulse 66, temperature 98.4 F (36.9 C), temperature source Oral, resp. rate 16, SpO2 95.00%.  PHYSICAL EXAM: General:  Well nourished, well developed, in no acute distress HEENT: normal Lymph: no adenopathy Neck: no JVD Endocrine:  No thryomegaly Vascular: No carotid bruits; FA pulses 2+ bilaterally without bruits Cardiac:  normal S1, S2; RRR; no murmur Lungs:  clear to auscultation  bilaterally, no wheezing, rhonchi or rales Abd: soft, nontender, no hepatomegaly Ext: no edema Musculoskeletal:  No deformities, BUE and BLE strength normal and equal Skin: warm and dry Neuro:  CNs 2-12 intact, no focal abnormalities noted Psych:  Normal affect   EKG:  AFib with RVR and abarrent conduction with LBBB morphology.  Labs:   Lab Results  Component Value Date   WBC 16.2* 12/06/2013   HGB 15.5* 12/06/2013   HCT 44.5 12/06/2013   MCV 90.8 12/06/2013   PLT 225 12/06/2013    Recent Labs Lab 12/06/13 2004  NA 133*  K 3.9  CL 105  CO2 23  BUN 13  CREATININE 0.82  CALCIUM 9.0  PROT 7.5  BILITOT 0.3  ALKPHOS 66  ALT 16  AST 22  GLUCOSE 153*    Recent Labs  12/06/13 2000  TROPONINI <0.30   No results found for this basename: CHOL, HDL, LDLCALC, TRIG   No results found for this basename: DDIMER    Radiology/Studies:  Dg Chest Port 1 View  12/06/2013   CLINICAL DATA:  Chest pain shortness of breath and atrial fibrillation.  EXAM: PORTABLE CHEST - 1 VIEW  COMPARISON:  PA and lateral chest x-ray of April 14, 2010  FINDINGS: The lungs are well-expanded. The interstitial markings are mildly increased. The cardiopericardial silhouette is mildly enlarged. The pulmonary vascularity is indistinct centrally. There is a hiatal hernia. There is no pleural effusion. The bony thorax is unremarkable.  IMPRESSION: The findings are consistent with low-grade CHF. A followup PA and lateral chest x-ray would be of value when the patient can tolerate the procedure.   Electronically Signed   By: David  [place]   On: 12/06/2013 20:21     ASSESSMENT AND PLAN:   1. AFib with RVR - This is her most sustained episode since her ablation and did not respond to PO flecainide - Start PO diltiazem, stop gtt - monitor on telemetry overnight - replete K>4 and Mg > 2 - continue home coumadin per pharmacy - could consider repeat flecainide cardioversion attempt 24 hours post first attempt if  she does not convert overnight. - repeat troponin in the morning  2. Leukocytosis - likely leukamoid reaction due to stressor but will repeat in the morning.  No infectious symptoms.  Will send UA.      Signed,  Yaakov Guthrie, MD 12/06/2013, 11:49 PM

## 2013-12-06 NOTE — ED Notes (Signed)
MD at bedside. 

## 2013-12-06 NOTE — ED Provider Notes (Signed)
CSN: [id number]     Arrival date & time 12/06/13  1940 History   First MD Initiated Contact with Patient 12/06/13 1946     Chief Complaint  Patient presents with  . Chest Pain  . Palpitations     HPI  Patient presents with palpitations and left-sided chest pain.  His symptoms began approximately 12 hours ago.  Patient had palpitations, and pain followed soon thereafter. The pain is nonradiating, sore, mild. Symptoms have been persistent in spite of using flecainide, 4 times. Patient denies concurrent dyspnea, lightheadedness, syncope, nausea, vomiting, fevers, chills or other focal complaints.   Past Medical History  Diagnosis Date  . A-fib   . GERD (gastroesophageal reflux disease)   . Hypertension   . Headache(784.0)   . Seasonal allergies   . Lower leg edema   . Hypothyroidism   . Restless leg syndrome   . DJD (degenerative joint disease)   . Diverticular disease   . PONV (postoperative nausea and vomiting)   . Dysphagia   . Shortness of breath   . Pneumonia     history of  . Hiatal hernia   . Chest pain, non-cardiac   . Dysrhythmia     hx. Atrial fib-ablation done, now intermittently   Past Surgical History  Procedure Laterality Date  . Rhinoplasty    . Abdominal hysterectomy    . Appendectomy    . Tubal ligation    . Eye surgery      cataracat  . Cardioversion      x3  . Breast surgery      left lumpectomy  . Tonsillectomy  1942  . Thyroidectomy  1973  . Bladder polyps    . Rotator cuff repair    . Esophagogastroduodenoscopy  03/03/2011    Procedure: ESOPHAGOGASTRODUODENOSCOPY (EGD);  Surgeon: Charolett BumpersMartin K Johnson, MD;  [facility];  Service: Endoscopy;  Laterality: N/A;  . Balloon dilation  03/03/2011    Procedure: BALLOON DILATION;  Surgeon: Charolett BumpersMartin K Johnson, MD;  Location: [facility];  Service: Endoscopy;  Laterality: N/A;  . Atrial fibrillation ablation  09-06-13    2'14 -[facility]  . Cataract extraction Left   . Colonoscopy with  propofol N/A 09/24/2013    Procedure: COLONOSCOPY WITH PROPOFOL;  Surgeon: Charolett BumpersMartin K Johnson, MD;  [facility];  Service: Endoscopy;  Laterality: N/A;   Family History  Problem Relation Age of Onset  . Anesthesia problems Neg Hx   . Hypotension Neg Hx   . Malignant hyperthermia Neg Hx   . Pseudochol deficiency Neg Hx   . Transient ischemic attack Mother   . Heart failure Father   . CVA Father   . Lung cancer Father   . Hypertension Sister   . Cancer Brother    History  Substance Use Topics  . Smoking status: Never Smoker   . Smokeless tobacco: Not on file  . Alcohol Use: No     Comment: rare wine   OB History   Grav Para Term Preterm Abortions TAB SAB Ect Mult Living                 Review of Systems  Constitutional:       Per HPI, otherwise negative  HENT:       Per HPI, otherwise negative  Respiratory:       Per HPI, otherwise negative  Cardiovascular:       Per HPI, otherwise negative  Gastrointestinal: Negative for vomiting.  Endocrine:  Negative aside from HPI  Genitourinary:       Neg aside from HPI   Musculoskeletal:       Per HPI, otherwise negative  Skin: Negative.   Neurological: Negative for syncope.      Allergies  Tramadol; Codeine; Erythromycin; Penicillins; Tetracyclines & related; Diltiazem; and Tetracycline  Home Medications   Prior to Admission medications   Medication Sig Start Date End Date Taking? Authorizing Provider  butalbital-acetaminophen-caffeine (FIORICET WITH CODEINE) 50-325-40-30 MG per capsule Take 1 capsule by mouth every 4 (four) hours as needed. For migraines    Historical Provider, MD  Calcium Citrate-Vitamin D (CITRACAL + D PO) Take 1 tablet by mouth 2 (two) times daily.    Historical Provider, MD  esomeprazole (NEXIUM) 20 MG capsule Take 20 mg by mouth daily at 12 noon.    Historical Provider, MD  estradiol (ESTRACE) 1 MG tablet Take 1 mg by mouth daily.    Historical Provider, MD  furosemide (LASIX) 40 MG  tablet Take 40 mg by mouth every morning.     Historical Provider, MD  hydroxypropyl methylcellulose (ISOPTO TEARS) 2.5 % ophthalmic solution Place 1 drop into both eyes 3 (three) times daily as needed for dry eyes.    Historical Provider, MD  levothyroxine (SYNTHROID, LEVOTHROID) 150 MCG tablet Take 150 mcg by mouth daily before breakfast.     Historical Provider, MD  LORazepam (ATIVAN) 1 MG tablet Take 0.5-1 mg by mouth at bedtime as needed. For sleep    Historical Provider, MD  Magnesium 500 MG CAPS Take 500 mg by mouth daily.    Historical Provider, MD  nebivolol (BYSTOLIC) 5 MG tablet Take 5 mg by mouth every evening.     Historical Provider, MD  potassium chloride (K-DUR) 10 MEQ tablet TAKE 1 TABLET BY MOUTH DAILY. 09/13/13   Quintella Reichert, MD  Probiotic Product (ALIGN PO) Take 1 capsule by mouth daily.     Historical Provider, MD  rizatriptan (MAXALT) 10 MG tablet Take 10 mg by mouth as needed for migraine. May repeat in 2 hours if needed    Historical Provider, MD  warfarin (COUMADIN) 5 MG tablet Take 2.5-5 mg by mouth every other day. 5 mg Sunday, Tuesday, Thursday, Saturday. 2.5mg  Monday, Wednesday and Friday    Historical Provider, MD   BP 144/88  Pulse 151  Temp(Src) 98.4 F (36.9 C) (Oral)  Resp 20  SpO2 100% Physical Exam  Nursing note and vitals reviewed. Constitutional: She is oriented to person, place, and time. She appears well-developed and well-nourished. No distress.  HENT:  Head: Normocephalic and atraumatic.  Eyes: Conjunctivae and EOM are normal.  Cardiovascular: An irregularly irregular rhythm present. Tachycardia present.   Pulmonary/Chest: Effort normal and breath sounds normal. No stridor. No respiratory distress.  Abdominal: She exhibits no distension.  Musculoskeletal: She exhibits no edema.  Neurological: She is alert and oriented to person, place, and time. No cranial nerve deficit.  Skin: Skin is warm and dry.  Psychiatric: She has a normal mood and  affect.    ED Course  Procedures (including critical care time) Labs Review Labs Reviewed  COMPREHENSIVE METABOLIC PANEL - Abnormal; Notable for the following:    Sodium 133 (*)    Glucose, Bld 153 (*)    GFR calc non Af Amer 66 (*)    GFR calc Af Amer 77 (*)    All other components within normal limits  CBC WITH DIFFERENTIAL - Abnormal; Notable for the following:  WBC 16.2 (*)    Hemoglobin 15.5 (*)    Neutrophils Relative % 78 (*)    Neutro Abs 12.5 (*)    Lymphocytes Relative 11 (*)    Monocytes Absolute 1.9 (*)    All other components within normal limits  MAGNESIUM  TROPONIN I   Patient states that last INR was 2.8, this week.   Imaging Review Dg Chest Port 1 View  12/06/2013   CLINICAL DATA:  Chest pain shortness of breath and atrial fibrillation.  EXAM: PORTABLE CHEST - 1 VIEW  COMPARISON:  PA and lateral chest x-ray of April 14, 2010  FINDINGS: The lungs are well-expanded. The interstitial markings are mildly increased. The cardiopericardial silhouette is mildly enlarged. The pulmonary vascularity is indistinct centrally. There is a hiatal hernia. There is no pleural effusion. The bony thorax is unremarkable.  IMPRESSION: The findings are consistent with low-grade CHF. A followup PA and lateral chest x-ray would be of value when the patient can tolerate the procedure.   Electronically Signed   By: David  [place]   On: 12/06/2013 20:21     EKG Interpretation   Date/Time:  Friday December 06 2013 19:44:09 EDT Ventricular Rate:  162 PR Interval:    QRS Duration: 118 QT Interval:  290 QTC Calculation: 476 R Axis:   -94 Text Interpretation:  Atrial fibrillation with rapid ventricular response  Right superior axis deviation Anterior infarct , age undetermined Abnormal  ECG Atrial fibrillation with rapid ventricular response Abnormal ekg  Confirmed by Gerhard Munch  MD [id number]) on 12/06/2013 8:08:36 PM      I reviewed the EMR  I had a conversation with patient  regarding the necessity to convert her atrial fibrillation. Patient is adamant about not staying overnight.  11:16 PM After the patient's initial evaluation she had initiation of diltiazem bolus, drip. Patient decrease in heart rate, but no conversion of her rhythm. Patient has a became a moderately hypotensive, which improved following decrease in diltiazem drip.  Patient has no ongoing symptoms.  HR 80's- afib, BP 100 SBP  Given the patient's persistent hypotension, arrhythmia, I discussed the patient's case with cardiology for further evaluation and management.   After discussion with cardiology, the patient will have oral loading of diltiazem, admitted for further evaluation and management.    MDM   patient presents with A. fib, RPR. Patient initially is chest pain, left-sided arm discomfort.  Symptoms resolve, though the patient's arrhythmia persists, and she has hypertension with efforts to achieve cardioversion. Given the hypotension, arrhythmia, patient required cardiac evaluation and admission for further E/M.  CRITICAL CARE Performed by: Gerhard Munch Total critical care time: 35 Critical care time was exclusive of separately billable procedures and treating other patients. Critical care was necessary to treat or prevent imminent or life-threatening deterioration. Critical care was time spent personally by me on the following activities: development of treatment plan with patient and/or surrogate as well as nursing, discussions with consultants, evaluation of patient's response to treatment, examination of patient, obtaining history from patient or surrogate, ordering and performing treatments and interventions, ordering and review of laboratory studies, ordering and review of radiographic studies, pulse oximetry and re-evaluation of patient's condition.      Gerhard Munch, MD 12/06/13 [phone]

## 2013-12-06 NOTE — ED Notes (Signed)
Presents with onset of palpitations and heart racing began this afternoon-took 4 50 mg flecanide with no response. Began experiencing left sided chest pain with radiation into left arm and shoulder. Pt is alert and oriented. Pain is associated with weakness and SOB.

## 2013-12-07 ENCOUNTER — Encounter (HOSPITAL_COMMUNITY): Payer: Self-pay | Admitting: *Deleted

## 2013-12-07 DIAGNOSIS — I10 Essential (primary) hypertension: Secondary | ICD-10-CM | POA: Diagnosis not present

## 2013-12-07 DIAGNOSIS — I4891 Unspecified atrial fibrillation: Secondary | ICD-10-CM

## 2013-12-07 DIAGNOSIS — D72829 Elevated white blood cell count, unspecified: Secondary | ICD-10-CM | POA: Diagnosis not present

## 2013-12-07 DIAGNOSIS — K219 Gastro-esophageal reflux disease without esophagitis: Secondary | ICD-10-CM | POA: Diagnosis not present

## 2013-12-07 LAB — CBC
HEMATOCRIT: 38 % (ref 36.0–46.0)
HEMOGLOBIN: 13.1 g/dL (ref 12.0–15.0)
MCH: 32 pg (ref 26.0–34.0)
MCHC: 34.5 g/dL (ref 30.0–36.0)
MCV: 92.9 fL (ref 78.0–100.0)
Platelets: 204 10*3/uL (ref 150–400)
RBC: 4.09 MIL/uL (ref 3.87–5.11)
RDW: 13 % (ref 11.5–15.5)
WBC: 5.2 10*3/uL (ref 4.0–10.5)

## 2013-12-07 LAB — TROPONIN I: Troponin I: <0.3 ng/mL (ref <0.30)

## 2013-12-07 LAB — BASIC METABOLIC PANEL
ANION GAP: 13 (ref 5–15)
BUN: 13 mg/dL (ref 6–23)
CALCIUM: 8.4 mg/dL (ref 8.4–10.5)
CHLORIDE: 107 meq/L (ref 96–112)
CO2: 19 mEq/L (ref 19–32)
CREATININE: 0.69 mg/dL (ref 0.50–1.10)
GFR calc Af Amer: >90 mL/min (ref >90)
GFR calc non Af Amer: 81 mL/min — ABNORMAL LOW (ref >90)
GLUCOSE: 104 mg/dL — AB (ref 70–99)
Potassium: 4.3 mEq/L (ref 3.7–5.3)
Sodium: 139 mEq/L (ref 137–147)

## 2013-12-07 LAB — PROTIME-INR
INR: 3.31 — AB (ref 0.00–1.49)
PROTHROMBIN TIME: 33.6 s — AB (ref 11.6–15.2)

## 2013-12-07 MED ORDER — LEVOTHYROXINE SODIUM 150 MCG PO TABS
150.0000 ug | ORAL_TABLET | Freq: Every day | ORAL | Status: DC
  Administered 2013-12-07: 150 ug via ORAL
  Filled 2013-12-07 (×2): qty 1

## 2013-12-07 MED ORDER — TEMAZEPAM 15 MG PO CAPS: 15.0000 mg | ORAL_CAPSULE | Freq: Every evening | ORAL | Status: DC | PRN

## 2013-12-07 MED ORDER — NEBIVOLOL HCL 5 MG PO TABS
5.0000 mg | ORAL_TABLET | Freq: Every evening | ORAL | Status: DC
  Filled 2013-12-07: qty 1

## 2013-12-07 MED ORDER — DILTIAZEM HCL 30 MG PO TABS: 30.0000 mg | ORAL_TABLET | Freq: Three times a day (TID) | ORAL | Status: DC

## 2013-12-07 MED ORDER — FUROSEMIDE 40 MG PO TABS
40.0000 mg | ORAL_TABLET | Freq: Every morning | ORAL | Status: DC
  Filled 2013-12-07: qty 1

## 2013-12-07 NOTE — Progress Notes (Signed)
UR completed 

## 2013-12-07 NOTE — Progress Notes (Signed)
Patient Name: Natasha MurrainJane B [name] Date of Encounter: 12/07/2013     Principal Problem:   Atrial fibrillation with RVR Active Problems:   GERD (gastroesophageal reflux disease)   Hypertension   Hypothyroidism   DJD (degenerative joint disease)   Hiatal hernia   History of anticoagulant therapy   Edema leg   AF (paroxysmal atrial fibrillation)    SUBJECTIVE  Feeling much better. Eager to go home.   CURRENT MEDS . diltiazem  30 mg Oral 4 times per day  . furosemide  40 mg Oral q morning - 10a  . levothyroxine  150 mcg Oral QAC breakfast  . magnesium oxide  400 mg Oral Once  . nebivolol  5 mg Oral QPM  . potassium chloride  20 mEq Oral Once    OBJECTIVE  Filed Vitals:   12/06/13 2335 12/07/13 0000 12/07/13 0107 12/07/13 0500  BP: 109/70 112/71 102/64   Pulse: 66 72 72 65  Temp:   97.5 F (36.4 C) 97.8 F (36.6 C)  TempSrc:   Oral Oral  Resp: 16 19 20 18   Weight:   214 lb 9.6 oz (97.342 kg)   SpO2: 95% 94% 97% 97%    Intake/Output Summary (Last 24 hours) at 12/07/13 0759 Last data filed at 12/07/13 0333  Gross per 24 hour  Intake     15 ml  Output      0 ml  Net     15 ml   Filed Weights   12/07/13 0107  Weight: 214 lb 9.6 oz (97.342 kg)    PHYSICAL EXAM  General: Pleasant, NAD. Neuro: Alert and oriented X 3. Moves all extremities spontaneously. Psych: Normal affect. HEENT:  Normal  Neck: Supple without bruits or JVD. Lungs:  Resp regular and unlabored, CTA. Heart: RRR no s3, s4, or murmurs. Abdomen: Soft, non-tender, non-distended, BS + x 4.  Extremities: No clubbing, cyanosis or edema. DP/PT/Radials 2+ and equal bilaterally.  Accessory Clinical Findings  CBC  Recent Labs  12/06/13 1959 12/07/13 0510  WBC 16.2* 5.2  NEUTROABS 12.5*  --   HGB 15.5* 13.1  HCT 44.5 38.0  MCV 90.8 92.9  PLT 225 204   Basic Metabolic Panel  Recent Labs  12/06/13 2004 12/07/13 0510  NA 133* 139  K 3.9 4.3  CL 105 107  CO2 23 19  GLUCOSE 153* 104*  BUN  13 13  CREATININE 0.82 0.69  CALCIUM 9.0 8.4  MG 1.8  --    Liver Function Tests  Recent Labs  12/06/13 2004  AST 22  ALT 16  ALKPHOS 66  BILITOT 0.3  PROT 7.5  ALBUMIN 3.7    Cardiac Enzymes  Recent Labs  12/06/13 2000 12/07/13 0520  TROPONINI <0.30 <0.30    TELE  NSR, period of 1st deg AV block. Few PVCs  Radiology/Studies  Dg Chest Port 1 View  12/06/2013   CLINICAL DATA:  Chest pain shortness of breath and atrial fibrillation.  EXAM: PORTABLE CHEST - 1 VIEW  COMPARISON:  PA and lateral chest x-ray of April 14, 2010  FINDINGS: The lungs are well-expanded. The interstitial markings are mildly increased. The cardiopericardial silhouette is mildly enlarged. The pulmonary vascularity is indistinct centrally. There is a hiatal hernia. There is no pleural effusion. The bony thorax is unremarkable.  IMPRESSION: The findings are consistent with low-grade CHF. A followup PA and lateral chest x-ray would be of value when the patient can tolerate the procedure.      ASSESSMENT AND  PLAN [name] is a 80 y.o. female with a history of HTN, GERD, hypothyroidism, and paroxysmal atrial fibrillation s/p ablation 05/2012 by Dr. Macon Large who presented to the [facility] ED last night in AFib w RVR after failled attempt at pill in the pocket with 200mg  of flecainide at home.  AFib with RVR- Followed by Dr. Macon Large ([facility]) and enrolled in the CABANA trial.  - She has had a couple short, self limiting or flecainide responsive episodes of afib since the time of her ablation in 05/2012. Last night was the most sustained episode since her ablation and did not respond to PO flecainide. - She was initially started on a dilt gtt, which was then converted to PO diltiazem. She spontaneously converted to sinus late last night and continues to be in NSR. - Continue home coumadin per pharmacy. INR 3.31. - Troponin neg x2   Leukocytosis  -  Now resolved. 16.2-->5.2. Likely leukamoid reaction due to  stressor but will repeat in the morning. No infectious symptoms.   HTN- Continue Bystolic 5mg     [name] PA-C  Pager [phone]  Patient examined chart reviewed  D/c with cardizem 30 q8  F/u Scherrie Gerlach

## 2013-12-07 NOTE — Discharge Summary (Signed)
Discharge Summary   Patient ID: Natasha [name] MRN: [id number], DOB/AGE: 12-04-1932 80 y.o. Admit date: 12/06/2013 D/C date:     12/07/2013  Primary Cardiologist: Dr. Mayford Knife  Primary Electrophysiologist: Dr. Macon Large ([facility])   Principal Problem:   Atrial fibrillation with RVR Active Problems:   GERD (gastroesophageal reflux disease)   Hypertension   Hypothyroidism   DJD (degenerative joint disease)   Hiatal hernia   History of anticoagulant therapy   Edema leg   AF (paroxysmal atrial fibrillation)   Discharge Diagnosis: Afib with RVR- admitted for rate control with diltiazem and spontaneously converted into NSR   HPI: Natasha [name] is a 80 y.o. female with a history of HTN, GERD, hypothyroidism, and paroxysmal atrial fibrillation s/p ablation 05/2012 by Dr. Macon Large who presented to the [facility] ED last night in AFib w RVR after failled attempt at pill in the pocket with 200mg  of flecainide at home.  This episode started yesterday at 12:30 while sitting down and was associated with neck fullness and chest pressure radiating down left arm.  She took 200mg  of flecainide (pill-in-pocket) which did not convert her (this has worked in the past). On arrival to the ED she was in AFib with RVR, rate in the 160s. Her rate responded well to diltiazem gtt a 5. Until this event today she has been in her usual state of health. Very active with volunteer work, no SOB, CP, stable mild edema.    Hospital Course  AFib with RVR- Followed by Dr. Macon Large ([facility]) and enrolled in the CABANA trial.  - She has had a couple short, self limiting or flecainide responsive episodes of afib since the time of her ablation in 05/2012. Last night was the most sustained episode since her ablation and did not respond to PO flecainide.  - She was initially started on a dilt gtt, which was then converted to PO diltiazem. She spontaneously converted to sinus late last night and continues to be in NSR.  - Continue home coumadin per  pharmacy. INR 3.31.  - Troponin neg x2   Leukocytosis  - Now resolved. 16.2-->5.2. Likely leukamoid reaction due to stressor but will repeat in the morning. No infectious symptoms.   HTN- Continue Bystolic 5mg    The patient has had an uncomplicated hospital course and is recovering well. She has been seen by Dr. Eden Emms today and deemed ready for discharge home. All follow-up appointments have been scheduled. Discharge medications are listed below. She will be sent home on low dose of diltiazem 30mg  TID.   Discharge Vitals: Blood pressure 102/64, pulse 65, temperature 97.8 F (36.6 C), temperature source Oral, resp. rate 18, weight 214 lb 9.6 oz (97.342 kg), SpO2 97.00%.  Labs: Lab Results  Component Value Date   WBC 5.2 12/07/2013   HGB 13.1 12/07/2013   HCT 38.0 12/07/2013   MCV 92.9 12/07/2013   PLT 204 12/07/2013     Recent Labs Lab 12/06/13 2004 12/07/13 0510  NA 133* 139  K 3.9 4.3  CL 105 107  CO2 23 19  BUN 13 13  CREATININE 0.82 0.69  CALCIUM 9.0 8.4  PROT 7.5  --   BILITOT 0.3  --   ALKPHOS 66  --   ALT 16  --   AST 22  --   GLUCOSE 153* 104*    Recent Labs  12/06/13 2000 12/07/13 0520  TROPONINI <0.30 <0.30     Diagnostic Studies/Procedures   Dg Chest Port 1 View  12/06/2013  CLINICAL DATA:  Chest pain shortness of breath and atrial fibrillation.  EXAM: PORTABLE CHEST - 1 VIEW  COMPARISON:  PA and lateral chest x-ray of April 14, 2010  FINDINGS: The lungs are well-expanded. The interstitial markings are mildly increased. The cardiopericardial silhouette is mildly enlarged. The pulmonary vascularity is indistinct centrally. There is a hiatal hernia. There is no pleural effusion. The bony thorax is unremarkable.  IMPRESSION: The findings are consistent with low-grade CHF. A followup PA and lateral chest x-ray would be of value when the patient can tolerate the procedure.    Discharge Medications     Medication List         acetaminophen 500 MG  tablet  Commonly known as:  TYLENOL  Take 500 mg by mouth every 6 (six) hours as needed for mild pain or headache.     butalbital-acetaminophen-caffeine 50-325-40-30 MG per capsule  Commonly known as:  FIORICET WITH CODEINE  Take 1 capsule by mouth every 4 (four) hours as needed. For migraines     cholecalciferol 1000 UNITS tablet  Commonly known as:  VITAMIN D  Take 1,000 Units by mouth daily.     CITRACAL + D PO  Take 1 tablet by mouth 2 (two) times daily.     diltiazem 30 MG tablet  Commonly known as:  CARDIZEM  Take 1 tablet (30 mg total) by mouth 3 (three) times daily.     estradiol 1 MG tablet  Commonly known as:  ESTRACE  Take 1 mg by mouth daily.     famotidine 10 MG chewable tablet  Commonly known as:  PEPCID AC  Chew 10 mg by mouth 2 (two) times daily.     furosemide 40 MG tablet  Commonly known as:  LASIX  Take 40 mg by mouth every morning.     hydroxypropyl methylcellulose 2.5 % ophthalmic solution  Commonly known as:  ISOPTO TEARS  Place 1 drop into both eyes 3 (three) times daily as needed for dry eyes.     levothyroxine 150 MCG tablet  Commonly known as:  SYNTHROID, LEVOTHROID  Take 150 mcg by mouth daily before breakfast.     LORazepam 1 MG tablet  Commonly known as:  ATIVAN  Take 0.5-1 mg by mouth at bedtime as needed. For sleep     Magnesium 500 MG Caps  Take 500 mg by mouth daily.     nebivolol 5 MG tablet  Commonly known as:  BYSTOLIC  Take 5 mg by mouth every evening.     NEXIUM 20 MG capsule  Generic drug:  esomeprazole  Take 20 mg by mouth daily at 12 noon.     OVER THE COUNTER MEDICATION  Take 1 tablet by mouth daily. Medication: Florajen     potassium chloride 10 MEQ tablet  Commonly known as:  K-DUR  Take 10 mEq by mouth daily.     warfarin 5 MG tablet  Commonly known as:  COUMADIN  Take 2.5-5 mg by mouth every other day. 5 mg Sunday, Tuesday, Thursday, Saturday. 2.5mg  Monday, Wednesday and Friday        Disposition   The  patient will be discharged in stable condition to home.  Follow-up Information   Follow up with Quintella Reichert, MD.   Specialty:  Cardiology   Contact information:   [address] [place] [address] [phone]       Follow up with Val Riles DAN, MD. (Please follow up with Dr. Macon Large about your  atrial fibrillation.)    Specialty:  Cardiology   Contact information:   [address] [facility] [address] [facility] [address] [place] [id number] [phone]         Duration of Discharge Encounter: Greater than 30 minutes including physician and PA time.  SignedVenetia Maxon, STERN, KATHRYN PA-C 12/07/2013, 9:26 AM

## 2013-12-09 ENCOUNTER — Other Ambulatory Visit: Payer: Self-pay | Admitting: Cardiology

## 2013-12-10 ENCOUNTER — Telehealth: Payer: Self-pay | Admitting: Cardiology

## 2013-12-10 NOTE — Telephone Encounter (Signed)
New message    Patient calling C/O heart out of rhythm  Pulse rate 120.   Heart rate was 169 on Friday. Was admit to hospital. Discharge on Saturday am.

## 2013-12-10 NOTE — Telephone Encounter (Signed)
TO Dr Turner to advise.  

## 2013-12-11 NOTE — Telephone Encounter (Signed)
Spoke with pt , she states called yesterday due to her heart being out  of rhythm rate 120 beats/minute, and did not get a called back so she took "the pill in the packet" recommended by the cardiology study at [facility]. Pt took  Flecainide 450 mg yesterday. Pt  is in sinus rhythm now; no other symptoms. Pt is aware of appointment with Dr. Mayford Knife tomorrow 12/12/13 at 1:15 PM. Pt is aware to call the office in needed.

## 2013-12-11 NOTE — Telephone Encounter (Signed)
Needs to come in to see PA or DOD  in office today

## 2013-12-12 ENCOUNTER — Encounter: Payer: Self-pay | Admitting: Cardiology

## 2013-12-12 ENCOUNTER — Other Ambulatory Visit: Payer: Self-pay | Admitting: General Surgery

## 2013-12-12 ENCOUNTER — Ambulatory Visit (INDEPENDENT_AMBULATORY_CARE_PROVIDER_SITE_OTHER): Payer: Medicare Other | Admitting: Cardiology

## 2013-12-12 ENCOUNTER — Ambulatory Visit (INDEPENDENT_AMBULATORY_CARE_PROVIDER_SITE_OTHER): Payer: Medicare Other | Admitting: Pharmacist

## 2013-12-12 VITALS — BP 126/68 | HR 71 | Ht 69.0 in | Wt 214.0 lb

## 2013-12-12 DIAGNOSIS — I480 Paroxysmal atrial fibrillation: Secondary | ICD-10-CM

## 2013-12-12 DIAGNOSIS — R0602 Shortness of breath: Secondary | ICD-10-CM | POA: Insufficient documentation

## 2013-12-12 DIAGNOSIS — R079 Chest pain, unspecified: Secondary | ICD-10-CM | POA: Insufficient documentation

## 2013-12-12 DIAGNOSIS — I4891 Unspecified atrial fibrillation: Secondary | ICD-10-CM

## 2013-12-12 DIAGNOSIS — I10 Essential (primary) hypertension: Secondary | ICD-10-CM | POA: Diagnosis not present

## 2013-12-12 LAB — POCT INR: INR: 1.4

## 2013-12-12 LAB — TSH: TSH: 0.16 u[IU]/mL — ABNORMAL LOW (ref 0.35–4.50)

## 2013-12-12 MED ORDER — LEVOTHYROXINE SODIUM 125 MCG PO TABS: 125.0000 ug | ORAL_TABLET | Freq: Every day | ORAL | Status: DC

## 2013-12-12 NOTE — Progress Notes (Signed)
[address] [place], [place]  [address] Phone: [phone] Fax:  [phone]  Date:  12/12/2013   ID:  Natasha [name], DOB 01/03/1933, MRN [id number]  PCP:  Pearla Dubonnet, MD  Cardiologist:  Armanda Magic, MD     History of Present Illness: Natasha [name] is a 80 y.o. female with a history of PAF, HTN and chronic systemic anticoagulation who presents today for followup. She is doing well. She denies any chest pain, LE edema, dizziness, palpitations or syncope.  She has had a couple short, self limiting and flecainide responsive episodes of afib since the time of her ablation in 05/2012 but the one the other night was the most sustained episode since her ablation and did not respond to PO flecainide.  She was started on a cardizem gtt and then converted to PO diltiazem and converted to NSR.  She now presents back for followup.  She has an appt a [facility] in October.  Since the ER visit she has had another episode which converted to NSR after taking the flecainide again.  She is complaining of chronic fatigue.  She says when she had the episode of afib last week she had chest pressure and pain down her arms.  She has not had any exertional CP, just with the afib.  She is complaining of progressive of DOE which has gotten worse.  She       Wt Readings from Last 3 Encounters:  12/12/13 214 lb (97.07 kg)  12/07/13 214 lb 9.6 oz (97.342 kg)  09/24/13 213 lb (96.616 kg)     Past Medical History  Diagnosis Date  . GERD (gastroesophageal reflux disease)   . Hypertension   . Seasonal allergies   . Lower leg edema   . Hypothyroidism   . Restless leg syndrome   . DJD (degenerative joint disease)   . Diverticular disease   . Dysphagia   . Hiatal hernia   . PAF (paroxysmal atrial fibrillation)     s/p ablation done and also pill in the pocket flecainide    Current Outpatient Prescriptions  Medication Sig Dispense Refill  . acetaminophen (TYLENOL) 500 MG tablet Take 500 mg by  mouth every 6 (six) hours as needed for mild pain or headache.      . butalbital-acetaminophen-caffeine (FIORICET WITH CODEINE) 50-325-40-30 MG per capsule Take 1 capsule by mouth every 4 (four) hours as needed. For migraines      . Calcium Citrate-Vitamin D (CITRACAL + D PO) Take 1 tablet by mouth 2 (two) times daily.      . cholecalciferol (VITAMIN D) 1000 UNITS tablet Take 1,000 Units by mouth daily.      Marland Kitchen diltiazem (CARDIZEM) 30 MG tablet Take 1 tablet (30 mg total) by mouth 3 (three) times daily.  90 tablet  3  . esomeprazole (NEXIUM) 20 MG capsule Take 20 mg by mouth daily at 12 noon.      Marland Kitchen estradiol (ESTRACE) 1 MG tablet Take 1 mg by mouth daily.      . famotidine (PEPCID AC) 10 MG chewable tablet Chew 10 mg by mouth 2 (two) times daily.      . furosemide (LASIX) 40 MG tablet Take 40 mg by mouth every morning.       . hydroxypropyl methylcellulose (ISOPTO TEARS) 2.5 % ophthalmic solution Place 1 drop into both eyes 3 (three) times daily as needed for dry eyes.      Marland Kitchen levothyroxine (SYNTHROID, LEVOTHROID) 150 MCG  tablet Take 150 mcg by mouth daily before breakfast.       . LORazepam (ATIVAN) 1 MG tablet Take 0.5-1 mg by mouth at bedtime as needed. For sleep      . Magnesium 500 MG CAPS Take 500 mg by mouth daily.      . nebivolol (BYSTOLIC) 5 MG tablet Take 5 mg by mouth every evening.       Marland Kitchen OVER THE COUNTER MEDICATION Take 1 tablet by mouth daily. Medication: Florajen      . potassium chloride (K-DUR) 10 MEQ tablet TAKE 1 TABLET BY MOUTH DAILY.  30 tablet  1  . warfarin (COUMADIN) 5 MG tablet Take 2.5-5 mg by mouth every other day. 5 mg Sunday, Tuesday, Thursday, Saturday. 2.5mg  Monday, Wednesday and Friday       No current facility-administered medications for this visit.    Allergies:    Allergies  Allergen Reactions  . Tramadol Nausea Only  . Codeine Nausea And Vomiting and Nausea Only  . Erythromycin Other (See Comments) and Hives    Flushing on face  . Penicillins Hives    . Tetracyclines & Related Other (See Comments)    "Sores on legs"  . Diltiazem Rash    Rash with long acting form  . Tetracycline Rash    Social History:  The patient  reports that she has never smoked. She does not have any smokeless tobacco history on file. She reports that she does not drink alcohol or use illicit drugs.   Family History:  The patient's family history includes CVA in her father; Cancer in her brother; Heart failure in her father; Hypertension in her sister; Lung cancer in her father; Transient ischemic attack in her mother. There is no history of Anesthesia problems, Hypotension, Malignant hyperthermia, or Pseudochol deficiency.   ROS:  Please see the history of present illness.      All other systems reviewed and negative.   PHYSICAL EXAM: VS:  BP 126/68  Pulse 71  Ht 5\' 9"  (1.753 m)  Wt 214 lb (97.07 kg)  BMI 31.59 kg/m2 Well nourished, well developed, in no acute distress HEENT: normal Neck: no JVD Cardiac:  normal S1, S2; RRR; no murmur Lungs:  clear to auscultation bilaterally, no wheezing, rhonchi or rales Abd: soft, nontender, no hepatomegaly Ext: no edema Skin: warm and dry Neuro:  CNs 2-12 intact, no focal abnormalities noted     ASSESSMENT AND PLAN:  1. PAF now with several episodes of PAF - continue Coumadin/Bystolic/cardizem - recheck TSH 2. Chronic systemic anticoagulation 3. HTN controlled - continue Bystolic 4. SOB with fatigue since the ablation.  The SOB seems to be getting worse and now she is having chest discomfort with the afib.  - check Lexiscan myoveiw 5. Leg fatigue with walking with reduced pulses on exam  - bilateral LE arterial dopplers were normal  Followup with me in 6 months    Signed, Armanda Magic, MD 12/12/2013 1:49 PM

## 2013-12-12 NOTE — Patient Instructions (Signed)
Your physician recommends that you return for lab work in: TODAY (tsh) Your physician recommends that you continue on your current medications as directed. Please refer to the Current Medication list given to you today. Your physician has requested that you have a lexiscan myoview. For further information please visit [email or web address] Please follow instruction sheet, as given. Your physician wants you to follow-up in: 6 MONTHS WITH DR TURNER.  You will receive a reminder letter in the mail two months in advance. If you don't receive a letter, please call our office to schedule the follow-up appointment.

## 2013-12-13 DIAGNOSIS — E039 Hypothyroidism, unspecified: Secondary | ICD-10-CM | POA: Diagnosis not present

## 2013-12-13 DIAGNOSIS — E0580 Other thyrotoxicosis without thyrotoxic crisis or storm: Secondary | ICD-10-CM | POA: Diagnosis not present

## 2013-12-13 DIAGNOSIS — E063 Autoimmune thyroiditis: Secondary | ICD-10-CM | POA: Diagnosis not present

## 2013-12-17 ENCOUNTER — Ambulatory Visit (HOSPITAL_COMMUNITY): Payer: Medicare Other | Attending: Cardiology | Admitting: Radiology

## 2013-12-17 VITALS — BP 130/67 | HR 58 | Ht 69.0 in | Wt 214.0 lb

## 2013-12-17 DIAGNOSIS — R5381 Other malaise: Secondary | ICD-10-CM | POA: Diagnosis not present

## 2013-12-17 DIAGNOSIS — R079 Chest pain, unspecified: Secondary | ICD-10-CM | POA: Diagnosis not present

## 2013-12-17 DIAGNOSIS — I4891 Unspecified atrial fibrillation: Secondary | ICD-10-CM | POA: Diagnosis not present

## 2013-12-17 DIAGNOSIS — R0609 Other forms of dyspnea: Secondary | ICD-10-CM | POA: Insufficient documentation

## 2013-12-17 DIAGNOSIS — R0602 Shortness of breath: Secondary | ICD-10-CM | POA: Diagnosis not present

## 2013-12-17 DIAGNOSIS — R0989 Other specified symptoms and signs involving the circulatory and respiratory systems: Secondary | ICD-10-CM | POA: Diagnosis not present

## 2013-12-17 DIAGNOSIS — I10 Essential (primary) hypertension: Secondary | ICD-10-CM | POA: Diagnosis not present

## 2013-12-17 DIAGNOSIS — I480 Paroxysmal atrial fibrillation: Secondary | ICD-10-CM

## 2013-12-17 DIAGNOSIS — R0789 Other chest pain: Secondary | ICD-10-CM | POA: Insufficient documentation

## 2013-12-17 DIAGNOSIS — I491 Atrial premature depolarization: Secondary | ICD-10-CM | POA: Insufficient documentation

## 2013-12-17 DIAGNOSIS — R5383 Other fatigue: Secondary | ICD-10-CM

## 2013-12-17 DIAGNOSIS — R531 Weakness: Secondary | ICD-10-CM

## 2013-12-17 MED ORDER — TECHNETIUM TC 99M SESTAMIBI GENERIC - CARDIOLITE
11.0000 | Freq: Once | INTRAVENOUS | Status: AC | PRN
Start: 2013-12-17 — End: 2013-12-17
  Administered 2013-12-17: 11 via INTRAVENOUS

## 2013-12-17 MED ORDER — AMINOPHYLLINE 25 MG/ML IV SOLN
75.0000 mg | Freq: Once | INTRAVENOUS | Status: AC
  Administered 2013-12-17: 75 mg via INTRAVENOUS

## 2013-12-17 MED ORDER — REGADENOSON 0.4 MG/5ML IV SOLN
0.4000 mg | Freq: Once | INTRAVENOUS | Status: AC
  Administered 2013-12-17: 0.4 mg via INTRAVENOUS

## 2013-12-17 MED ORDER — TECHNETIUM TC 99M SESTAMIBI GENERIC - CARDIOLITE
33.0000 | Freq: Once | INTRAVENOUS | Status: AC | PRN
Start: 2013-12-17 — End: 2013-12-17
  Administered 2013-12-17: 33 via INTRAVENOUS

## 2013-12-17 NOTE — Progress Notes (Signed)
[facility] SITE 3 NUCLEAR MED [address] [place], [place] [address] [phone]    Cardiology Nuclear Med Study  Natasha [name] is a 80 y.o. female     MRN : [id number]     DOB: Jul 27, 1932  Procedure Date: 12/17/2013  Nuclear Med Background Indication for Stress Test:  Evaluation for Ischemia, and Patient seen in hospital on 12-06-2013 for AFIB/RVR, Chest Pressure,Enzymes negative History:  No prior known history of CAD, PAF, 2014 Myocardial Perfusion Imaging-Normal, EF=80%, and 08-14-2013 Echo: EF=60-65% Cardiac Risk Factors: Hypertension  Symptoms: Chest Pressure with/without exertion (last occurrence 12-11-2013), DOE, Palpitations and SOB   Nuclear Pre-Procedure Caffeine/Decaff Intake:  None NPO After: 8:00pm   Lungs:  clear O2 Sat: 98% on room air. IV 0.9% NS with Angio Cath:  22g  IV Site: R Hand  IV Started by:  Doyne Keel, CNMT  Chest Size (in):  40 Cup Size: C  Height: 5\' 9"  (1.753 m)  Weight:  214 lb (97.07 kg)  BMI:  Body mass index is 31.59 kg/(m^2). Tech Comments:  No Bystolic x 12 hrs    Nuclear Med Study 1 or 2 day study: 1 day  Stress Test Type:  Lexiscan  Reading MD: n/a  Order Authorizing Provider:  Gloris Manchester Turner,MD  Resting Radionuclide: Technetium 4m Sestamibi  Resting Radionuclide Dose: 11.0 mCi   Stress Radionuclide:  Technetium 62m Sestamibi  Stress Radionuclide Dose: 33.0 mCi           Stress Protocol Rest HR: 58 Stress HR: 82  Rest BP: 130/67 Stress BP: 133/89  Exercise Time (min): n/a METS: n/a   Predicted Max HR: 141 bpm % Max HR: 58.16 bpm Rate Pressure Product: 51025   Dose of Adenosine (mg):  n/a Dose of Lexiscan: 0.4 mg  Dose of Atropine (mg): n/a Dose of Dobutamine: n/a mcg/kg/min (at max HR)  Stress Test Technologist: Irean Hong, RN  Nuclear Technologist:  Doyne Keel, CNMT     Rest Procedure:  Myocardial perfusion imaging was performed at rest 45 minutes following the intravenous administration of Technetium 109m  Sestamibi. Rest ECG: NSR - Normal EKG  Stress Procedure:  The patient received IV Lexiscan 0.4 mg over 15-seconds.  Technetium 68m Sestamibi injected at 30-seconds. The patient complained of weakness in arms and legs, SOB, stomach pressure, headache, cough, and chest tightness. Quantitative spect images were obtained after a 45 minute delay. Aminophylline 75 mg IVP given 7 minutes into recovery due to persistent weakness in legs and arms with quick resolution of symptoms.  Stress ECG: There are scattered PACs.  QPS Raw Data Images:  Normal; no motion artifact; normal heart/lung ratio. Stress Images:  Normal homogeneous uptake in all areas of the myocardium. Rest Images:  Normal homogeneous uptake in all areas of the myocardium. Subtraction (SDS):  Normal Transient Ischemic Dilatation (Normal <1.22):  1.22 Lung/Heart Ratio (Normal <0.45):  0.28  Quantitative Gated Spect Images QGS EDV:  63 ml QGS ESV:  15 ml  Impression Exercise Capacity:  Lexiscan with no exercise. BP Response:  Normal blood pressure response. Clinical Symptoms:  No significant symptoms noted. ECG Impression:  No significant ECG changes with Lexiscan. Comparison with Prior Nuclear Study: No images to compare  Overall Impression:  Normal stress nuclear study.  LV Ejection Fraction: 76%.  LV Wall Motion:  Normal Wall Motion  Chrystie Nose, MD, [facility] Board Certified in Nuclear Cardiology Attending Cardiologist [facility]

## 2013-12-21 ENCOUNTER — Emergency Department (HOSPITAL_COMMUNITY)
Admission: EM | Admit: 2013-12-21 | Discharge: 2013-12-21 | Disposition: A | Discharge status: Home / Self Care | Payer: Medicare Other | Attending: Emergency Medicine | Admitting: Emergency Medicine

## 2013-12-21 ENCOUNTER — Encounter (HOSPITAL_COMMUNITY): Payer: Self-pay | Admitting: Emergency Medicine

## 2013-12-21 ENCOUNTER — Emergency Department (HOSPITAL_COMMUNITY): Payer: Medicare Other

## 2013-12-21 DIAGNOSIS — R42 Dizziness and giddiness: Secondary | ICD-10-CM | POA: Diagnosis not present

## 2013-12-21 DIAGNOSIS — I10 Essential (primary) hypertension: Secondary | ICD-10-CM | POA: Insufficient documentation

## 2013-12-21 DIAGNOSIS — E039 Hypothyroidism, unspecified: Secondary | ICD-10-CM | POA: Insufficient documentation

## 2013-12-21 DIAGNOSIS — Z79899 Other long term (current) drug therapy: Secondary | ICD-10-CM | POA: Insufficient documentation

## 2013-12-21 DIAGNOSIS — Z880 Allergy status to penicillin: Secondary | ICD-10-CM | POA: Diagnosis not present

## 2013-12-21 DIAGNOSIS — R079 Chest pain, unspecified: Secondary | ICD-10-CM | POA: Diagnosis not present

## 2013-12-21 DIAGNOSIS — Z8719 Personal history of other diseases of the digestive system: Secondary | ICD-10-CM | POA: Insufficient documentation

## 2013-12-21 DIAGNOSIS — K219 Gastro-esophageal reflux disease without esophagitis: Secondary | ICD-10-CM | POA: Insufficient documentation

## 2013-12-21 DIAGNOSIS — Z7901 Long term (current) use of anticoagulants: Secondary | ICD-10-CM | POA: Insufficient documentation

## 2013-12-21 DIAGNOSIS — R5383 Other fatigue: Secondary | ICD-10-CM | POA: Diagnosis not present

## 2013-12-21 DIAGNOSIS — I4891 Unspecified atrial fibrillation: Secondary | ICD-10-CM | POA: Insufficient documentation

## 2013-12-21 DIAGNOSIS — R5381 Other malaise: Secondary | ICD-10-CM | POA: Insufficient documentation

## 2013-12-21 DIAGNOSIS — Z8739 Personal history of other diseases of the musculoskeletal system and connective tissue: Secondary | ICD-10-CM | POA: Diagnosis not present

## 2013-12-21 DIAGNOSIS — Z9889 Other specified postprocedural states: Secondary | ICD-10-CM | POA: Insufficient documentation

## 2013-12-21 LAB — I-STAT TROPONIN, ED: Troponin i, poc: 0 ng/mL (ref 0.00–0.08)

## 2013-12-21 LAB — CBC
HEMATOCRIT: 43.8 % (ref 36.0–46.0)
HEMOGLOBIN: 15.9 g/dL — AB (ref 12.0–15.0)
MCH: 33.5 pg (ref 26.0–34.0)
MCHC: 36.3 g/dL — ABNORMAL HIGH (ref 30.0–36.0)
MCV: 92.4 fL (ref 78.0–100.0)
Platelets: 245 10*3/uL (ref 150–400)
RBC: 4.74 MIL/uL (ref 3.87–5.11)
RDW: 13 % (ref 11.5–15.5)
WBC: 5.2 10*3/uL (ref 4.0–10.5)

## 2013-12-21 LAB — BASIC METABOLIC PANEL
Anion gap: 13 (ref 5–15)
BUN: 15 mg/dL (ref 6–23)
CALCIUM: 8.8 mg/dL (ref 8.4–10.5)
CO2: 22 mEq/L (ref 19–32)
CREATININE: 0.82 mg/dL (ref 0.50–1.10)
Chloride: 102 mEq/L (ref 96–112)
GFR calc Af Amer: 77 mL/min — ABNORMAL LOW (ref >90)
GFR calc non Af Amer: 66 mL/min — ABNORMAL LOW (ref >90)
GLUCOSE: 118 mg/dL — AB (ref 70–99)
Potassium: 4.1 mEq/L (ref 3.7–5.3)
Sodium: 137 mEq/L (ref 137–147)

## 2013-12-21 LAB — PRO B NATRIURETIC PEPTIDE: Pro B Natriuretic peptide (BNP): 1507 pg/mL — ABNORMAL HIGH (ref 0–450)

## 2013-12-21 LAB — PROTIME-INR
INR: 2.2 — ABNORMAL HIGH (ref 0.00–1.49)
Prothrombin Time: 24.4 seconds — ABNORMAL HIGH (ref 11.6–15.2)

## 2013-12-21 LAB — MAGNESIUM: MAGNESIUM: 1.8 mg/dL (ref 1.5–2.5)

## 2013-12-21 MED ORDER — MAGNESIUM OXIDE 400 (241.3 MG) MG PO TABS
400.0000 mg | ORAL_TABLET | Freq: Once | ORAL | Status: AC
  Administered 2013-12-21: 400 mg via ORAL
  Filled 2013-12-21: qty 1

## 2013-12-21 MED ORDER — DILTIAZEM HCL 30 MG PO TABS
30.0000 mg | ORAL_TABLET | Freq: Once | ORAL | Status: DC
Start: 2013-12-21 — End: 2013-12-21
  Filled 2013-12-21: qty 1

## 2013-12-21 NOTE — ED Notes (Signed)
Pt had a run of SVT, HR 121, then dropped down to 50 for a brief period of time. Captured on monitior, showed to Dr. Elesa Massed. Per Dr. Elesa Massed, keep patient, hold Cardizem, and consult to cardiology again.

## 2013-12-21 NOTE — Discharge Instructions (Signed)
We have discussed her care with her cardiologist Dr. Myrtis Ser who recommends that you continue your flecainide 50 mg twice daily but also restart diltiazem 30 mg 3 times daily. Please also continue your Coumadin. Please followup with Dr. Mayford Knife and Dr. Macon Large next week. When you were discharged today you were back in a normal rhythm. Your labs today were unremarkable other than very slightly low magnesium level of 1.8 which we have replaced.   Atrial Fibrillation Atrial fibrillation is a type of irregular heart rhythm (arrhythmia). During atrial fibrillation, the upper chambers of the heart (atria) quiver continuously in a chaotic pattern. This causes an irregular and often rapid heart rate.  Atrial fibrillation is the result of the heart becoming overloaded with disorganized signals that tell it to beat. These signals are normally released one at a time by a part of the right atrium called the sinoatrial node. They then travel from the atria to the lower chambers of the heart (ventricles), causing the atria and ventricles to contract and pump blood as they pass. In atrial fibrillation, parts of the atria outside of the sinoatrial node also release these signals. This results in two problems. First, the atria receive so many signals that they do not have time to fully contract. Second, the ventricles, which can only receive one signal at a time, beat irregularly and out of rhythm with the atria.  There are three types of atrial fibrillation:   Paroxysmal. Paroxysmal atrial fibrillation starts suddenly and stops on its own within a week.  Persistent. Persistent atrial fibrillation lasts for more than a week. It may stop on its own or with treatment.  Permanent. Permanent atrial fibrillation does not go away. Episodes of atrial fibrillation may lead to permanent atrial fibrillation. Atrial fibrillation can prevent your heart from pumping blood normally. It increases your risk of stroke and can lead to heart  failure.  CAUSES   Heart conditions, including a heart attack, heart failure, coronary artery disease, and heart valve conditions.   Inflammation of the sac that surrounds the heart (pericarditis).  Blockage of an artery in the lungs (pulmonary embolism).  Pneumonia or other infections.  Chronic lung disease.  Thyroid problems, especially if the thyroid is overactive (hyperthyroidism).  Caffeine, excessive alcohol use, and use of some illegal drugs.   Use of some medicines, including certain decongestants and diet pills.  Heart surgery.   Birth defects.  Sometimes, no cause can be found. When this happens, the atrial fibrillation is called lone atrial fibrillation. The risk of complications from atrial fibrillation increases if you have lone atrial fibrillation and you are age 35 years or older. RISK FACTORS  Heart failure.  Coronary artery disease.  Diabetes mellitus.   High blood pressure (hypertension).   Obesity.   Other arrhythmias.   Increased age. SIGNS AND SYMPTOMS   A feeling that your heart is beating rapidly or irregularly.   A feeling of discomfort or pain in your chest.   Shortness of breath.   Sudden light-headedness or weakness.   Getting tired easily when exercising.   Urinating more often than normal (mainly when atrial fibrillation first begins).  In paroxysmal atrial fibrillation, symptoms may start and suddenly stop. DIAGNOSIS  Your health care provider may be able to detect atrial fibrillation when taking your pulse. Your health care provider may have you take a test called an ambulatory electrocardiogram (ECG). An ECG records your heartbeat patterns over a 24-hour period. You may also have other tests, such as:  Transthoracic echocardiogram (TTE). During echocardiography, sound waves are used to evaluate how blood flows through your heart.  Transesophageal echocardiogram (TEE).  Stress test. There is more than one type of  stress test. If a stress test is needed, ask your health care provider about which type is best for you.  Chest X-ray exam.  Blood tests.  Computed tomography (CT). TREATMENT  Treatment may include:  Treating any underlying conditions. For example, if you have an overactive thyroid, treating the condition may correct atrial fibrillation.  Taking medicine. Medicines may be given to control a rapid heart rate or to prevent blood clots, heart failure, or a stroke.  Having a procedure to correct the rhythm of the heart:  Electrical cardioversion. During electrical cardioversion, a controlled, low-energy shock is delivered to the heart through your skin. If you have chest pain, very low blood pressure, or sudden heart failure, this procedure may need to be done as an emergency.  Catheter ablation. During this procedure, heart tissues that send the signals that cause atrial fibrillation are destroyed.  Surgical ablation. During this surgery, thin lines of heart tissue that carry the abnormal signals are destroyed. This procedure can either be an open-heart surgery or a minimally invasive surgery. With the minimally invasive surgery, small cuts are made to access the heart instead of a large opening.  Pulmonary venous isolation. During this surgery, tissue around the veins that carry blood from the lungs (pulmonary veins) is destroyed. This tissue is thought to carry the abnormal signals. HOME CARE INSTRUCTIONS   Take medicines only as directed by your health care provider. Some medicines can make atrial fibrillation worse or recur.  If blood thinners were prescribed by your health care provider, take them exactly as directed. Too much blood-thinning medicine can cause bleeding. If you take too little, you will not have the needed protection against stroke and other problems.  Perform blood tests at home if directed by your health care provider. Perform blood tests exactly as directed.  Quit  smoking if you smoke.  Do not drink alcohol.  Do not drink caffeinated beverages such as coffee, soda, and some teas. You may drink decaffeinated coffee, soda, or tea.   Maintain a healthy weight.Do not use diet pills unless your health care provider approves. They may make heart problems worse.   Follow diet instructions as directed by your health care provider.  Exercise regularly as directed by your health care provider.  Keep all follow-up visits as directed by your health care provider. This is important. PREVENTION  The following substances can cause atrial fibrillation to recur:   Caffeinated beverages.  Alcohol.  Certain medicines, especially those used for breathing problems.  Certain herbs and herbal medicines, such as those containing ephedra or ginseng.  Illegal drugs, such as cocaine and amphetamines. Sometimes medicines are given to prevent atrial fibrillation from recurring. Proper treatment of any underlying condition is also important in helping prevent recurrence.  SEEK MEDICAL CARE IF:  You notice a change in the rate, rhythm, or strength of your heartbeat.  You suddenly begin urinating more frequently.  You tire more easily when exerting yourself or exercising. SEEK IMMEDIATE MEDICAL CARE IF:   You have chest pain, abdominal pain, sweating, or weakness.  You feel nauseous.  You have shortness of breath.  You suddenly have swollen feet and ankles.  You feel dizzy.  Your face or limbs feel numb or weak.  You have a change in your vision or speech. MAKE SURE  YOU:   Understand these instructions.  Will watch your condition.  Will get help right away if you are not doing well or get worse. Document Released: 04/04/2005 Document Revised: 08/19/2013 Document Reviewed: 05/15/2012 [facility] Patient Information 2015 [place], [place]. This information is not intended to replace advice given to you by your health care provider. Make sure you discuss  any questions you have with your health care provider.

## 2013-12-21 NOTE — ED Provider Notes (Addendum)
TIME SEEN: 4:10 PM  CHIEF COMPLAINT: Neck and arm tightness, shortness of breath, diaphoresis, palpitations  HPI: Patient is a 80 year old female with history of hypertension on diastolic, hypothyroidism, paroxysmal atrial fibrillation status post ablation in February 2014 by Dr. Macon Large at [facility] who is on chronic Coumadin therapy and in the CABANA study who presents to the emergency department with an episode of palpitations, heart rate in the 150s, tightness and pressure in her arms and bilateral neck, fatigue, lightheadedness, diaphoresis and shortness of breath that started at 1 PM today while at rest.   Patient reports that she has had intermittent atrial fibrillation since 2007. After her ablation in 2014, her symptoms are very well controlled. She states that she had another episode of atrial fibrillation in May 2015 he was seen at [facility] but reports this episode resolved quickly without taking a "pill in a packet" (flecanide). She had another episode of sustained atrial fibrillation and was extremely symptomatic December 06 2013. She tried taking flecainide prn but this did not help. She was admitted to the hospital for atrial fibrillation with RVR and converted on diltiazem. She was discharged home.    She states since discharge she had another episode of atrial fibrillation with RVR but did improve after taking flecainide at home. She had a stress test on 12/17/13 which was normal and had no a fib at that time. Cardiology recommended she continue her by systolic 5 mg at night and started on Cardizem 30 mg 3 times daily.    She states yesterday she had another episode of atrial fibrillation with RVR and called her cardiologist at [facility]. He recommended she stop taking diltiazem and start taking flecainide 50 mg twice daily. She has had a total of 3 doses of this medication. She states at approximately 1 PM today she began having symptoms again and felt the worst she has ever felt. Symptoms improved at  approximately 3 PM and she states she is now feeling much better and has no complaints.  ROS: See HPI Constitutional: no fever  Eyes: no drainage  ENT: no runny nose   Cardiovascular:  chest pain  Resp:  SOB  GI: no vomiting GU: no dysuria Integumentary: no rash  Allergy: no hives  Musculoskeletal: no leg swelling  Neurological: no slurred speech ROS otherwise negative  PAST MEDICAL HISTORY/PAST SURGICAL HISTORY:  Past Medical History  Diagnosis Date  . GERD (gastroesophageal reflux disease)   . Hypertension   . Seasonal allergies   . Lower leg edema   . Hypothyroidism   . Restless leg syndrome   . DJD (degenerative joint disease)   . Diverticular disease   . Dysphagia   . Hiatal hernia   . PAF (paroxysmal atrial fibrillation)     s/p ablation done and also pill in the pocket flecainide    MEDICATIONS:  Prior to Admission medications   Medication Sig Start Date End Date Taking? Authorizing Provider  acetaminophen (TYLENOL) 500 MG tablet Take 500 mg by mouth every 6 (six) hours as needed for mild pain or headache.    Historical Provider, MD  butalbital-acetaminophen-caffeine (FIORICET WITH CODEINE) 50-325-40-30 MG per capsule Take 1 capsule by mouth every 4 (four) hours as needed. For migraines    Historical Provider, MD  Calcium Citrate-Vitamin D (CITRACAL + D PO) Take 1 tablet by mouth 2 (two) times daily.    Historical Provider, MD  cholecalciferol (VITAMIN D) 1000 UNITS tablet Take 1,000 Units by mouth daily.    Historical Provider,  MD  diltiazem (CARDIZEM) 30 MG tablet Take 1 tablet (30 mg total) by mouth 3 (three) times daily. 12/07/13   Janetta Hora, PA-C  esomeprazole (NEXIUM) 20 MG capsule Take 20 mg by mouth daily at 12 noon.    Historical Provider, MD  estradiol (ESTRACE) 1 MG tablet Take 1 mg by mouth daily.    Historical Provider, MD  famotidine (PEPCID AC) 10 MG chewable tablet Chew 10 mg by mouth 2 (two) times daily.    Historical Provider, MD   furosemide (LASIX) 40 MG tablet Take 40 mg by mouth every morning.     Historical Provider, MD  hydroxypropyl methylcellulose (ISOPTO TEARS) 2.5 % ophthalmic solution Place 1 drop into both eyes 3 (three) times daily as needed for dry eyes.    Historical Provider, MD  levothyroxine (SYNTHROID, LEVOTHROID) 125 MCG tablet Take 1 tablet (125 mcg total) by mouth daily before breakfast. 12/12/13   Quintella Reichert, MD  LORazepam (ATIVAN) 1 MG tablet Take 0.5-1 mg by mouth at bedtime as needed. For sleep    Historical Provider, MD  Magnesium 500 MG CAPS Take 500 mg by mouth daily.    Historical Provider, MD  nebivolol (BYSTOLIC) 5 MG tablet Take 5 mg by mouth every evening.     Historical Provider, MD  OVER THE COUNTER MEDICATION Take 1 tablet by mouth daily. Medication: Florajen    Historical Provider, MD  potassium chloride (K-DUR) 10 MEQ tablet TAKE 1 TABLET BY MOUTH DAILY. 12/10/13   Quintella Reichert, MD  warfarin (COUMADIN) 5 MG tablet Take 2.5-5 mg by mouth every other day. 5 mg Sunday, Tuesday, Thursday, Saturday. 2.5mg  Monday, Wednesday and Friday    Historical Provider, MD    ALLERGIES:  Allergies  Allergen Reactions  . Tramadol Nausea Only  . Codeine Nausea And Vomiting and Nausea Only  . Erythromycin Other (See Comments) and Hives    Flushing on face  . Penicillins Hives  . Tetracyclines & Related Other (See Comments)    "Sores on legs"  . Diltiazem Rash and Other (See Comments)    Rash with long acting form  . Tetracycline Rash    SOCIAL HISTORY:  History  Substance Use Topics  . Smoking status: Never Smoker   . Smokeless tobacco: Not on file  . Alcohol Use: No     Comment: rare wine    FAMILY HISTORY: Family History  Problem Relation Age of Onset  . Anesthesia problems Neg Hx   . Hypotension Neg Hx   . Malignant hyperthermia Neg Hx   . Pseudochol deficiency Neg Hx   . Transient ischemic attack Mother   . Heart failure Father   . CVA Father   . Lung cancer Father   .  Hypertension Sister   . Cancer Brother     EXAM: BP 120/81  Pulse 82  Temp(Src) 97.4 F (36.3 C) (Oral)  Resp 18  SpO2 100% CONSTITUTIONAL: Alert and oriented and responds appropriately to questions. Well-appearing; well-nourished, pleasant, smiling, in no distress HEAD: Normocephalic EYES: Conjunctivae clear, PERRL ENT: normal nose; no rhinorrhea; moist mucous membranes; pharynx without lesions noted NECK: Supple, no meningismus, no LAD; no JVD  CARD: RRR; S1 and S2 appreciated; no murmurs, no clicks, no rubs, no gallops RESP: Normal chest excursion without splinting or tachypnea; breath sounds clear and equal bilaterally; no wheezes, no rhonchi, no rales,  ABD/GI: Normal bowel sounds; non-distended; soft, non-tender, no rebound, no guarding BACK:  The back appears normal and is  non-tender to palpation, there is no CVA tenderness EXT: Normal ROM in all joints; non-tender to palpation; no edema; normal capillary refill; no cyanosis    SKIN: Normal color for age and race; warm NEURO: Moves all extremities equally; no facial droop or slurred speech PSYCH: The patient's mood and manner are appropriate. Grooming and personal hygiene are appropriate.  MEDICAL DECISION MAKING: Patient here with episode of atrial fibrillation with RVR. She has spontaneously converted back to normal sinus rhythm and is now asymptomatic. Her labs are unremarkable except for mildly low magnesium of 1.8. Will replace with oral magnesium. Troponin negative. INR therapeutic at 2.20. BNP is slightly elevated at 1500 and she does have mild interstitial edema on her chest x-ray but has no chest pain or shortness of breath currently he does not appear volume overload on exam. Will consult cardiology for further recommendations I feel patient will likely be able to be discharged home with close outpatient followup. She is comfortable with this plan.  ED PROGRESS: Patient is now back in a normal sinus rhythm. She is  hemodynamically stable and asymptomatic. Discussed with Dr. Myrtis Ser with cardiology. He recommends the patient continue her flecainide 50 mg twice daily and go back to taking diltiazem 30 mg 3 times a day as well. Patient denies any history of bradycardia. He agrees with discharge home. Patient prefers discharge are for admission. Patient agrees with this plan. She will followup with Dr. Montez Morita and Dr. Macon Large.  Have discussed with her strict return precautions and supportive care instructions. She verbalized understanding and is comfortable plan.    EKG Interpretation  Date/Time:  Saturday December 21 2013 14:12:20 EDT Ventricular Rate:  124 PR Interval:    QRS Duration: 70 QT Interval:  308 QTC Calculation: 442 R Axis:   84 Text Interpretation:  Atrial fibrillation Low voltage QRS Septal infarct , age undetermined Abnormal ECG Confirmed by WARD,  DO, KRISTEN [phone]) on 12/21/2013 3:33:44 PM           EKG Interpretation  Date/Time:  Saturday December 21 2013 16:13:22 EDT Ventricular Rate:  87 PR Interval:  201 QRS Duration: 77 QT Interval:  352 QTC Calculation: 423 R Axis:   59 Text Interpretation:  Sinus rhythm Ventricular premature complex Low voltage, precordial leads Confirmed by WARD,  DO, KRISTEN ([id number]) on 12/21/2013 4:20:34 PM        Kristen N Ward, DO 12/21/13 1716     5:50 PM  Pt had an episode of going back into the 120s in atrial fibrillation for several seconds and then into the sinus bradycardia in the 40s. She was asymptomatic during this episode. She is now back to normal sinus rhythm in the 90s with a normal blood pressure. Discussed again with cardiology, this time discussing with Dr. Leeann Must who recommends that the patient not take her diltiazem and only continue a diastolic of flecainide. Patient still does not want admission. She can followup with her outpatient cardiologist. Discussed with the patient who agrees.  Layla Maw Ward, DO 12/21/13 1752

## 2013-12-21 NOTE — ED Notes (Signed)
Pt reports hx of atrial fib, had stress test done on tues and everything was good. Now having palpitations and feels like HR is irregular. Having pressure in her arms and neck, generalized fatigue and feels lightheaded.

## 2013-12-24 ENCOUNTER — Telehealth: Payer: Self-pay | Admitting: Cardiology

## 2013-12-24 NOTE — Telephone Encounter (Signed)
New Message  Pt called states that her heart is in and out of rythem. Pt request a call back to discuss//sr

## 2013-12-25 DIAGNOSIS — I4891 Unspecified atrial fibrillation: Secondary | ICD-10-CM | POA: Diagnosis not present

## 2013-12-25 DIAGNOSIS — R001 Bradycardia, unspecified: Secondary | ICD-10-CM | POA: Insufficient documentation

## 2013-12-25 DIAGNOSIS — I498 Other specified cardiac arrhythmias: Secondary | ICD-10-CM | POA: Diagnosis not present

## 2013-12-25 NOTE — Telephone Encounter (Signed)
Chart reviewed and patient had a mildly elevated BNP due to the atrial fibrillation.  Please find out if she is having any SOB or LE edema

## 2013-12-25 NOTE — Telephone Encounter (Signed)
Pt stated she has not noted any swelling. But she stated she had some SOB yesterday and it made her sweat with some walking up stairs or out to her fig tree.

## 2013-12-25 NOTE — Telephone Encounter (Signed)
Spoke with pt and she asked that Dr Mayford Knife look over her Discharge summary. She said she was told she had CHF. She is going to see Her Dr at [facility]. Val Riles today at 3:00 Pm.

## 2013-12-25 NOTE — Telephone Encounter (Signed)
Please find out what her Cardiologist at [facility] said today and get a copy of that OV note

## 2013-12-27 ENCOUNTER — Other Ambulatory Visit: Payer: Self-pay | Admitting: *Deleted

## 2013-12-27 MED ORDER — POTASSIUM CHLORIDE ER 10 MEQ PO TBCR: 10.0000 meq | EXTENDED_RELEASE_TABLET | Freq: Every day | ORAL | Status: DC

## 2013-12-27 NOTE — Telephone Encounter (Signed)
Called Dr Alisia Ferrari office and got a copy of the OV faxed over to our office for your review. Will put into your folder once I receive it.

## 2013-12-30 ENCOUNTER — Ambulatory Visit (INDEPENDENT_AMBULATORY_CARE_PROVIDER_SITE_OTHER): Payer: Medicare Other | Admitting: Pharmacist

## 2013-12-30 DIAGNOSIS — I4891 Unspecified atrial fibrillation: Secondary | ICD-10-CM

## 2013-12-30 LAB — POCT INR: INR: 3

## 2013-12-31 NOTE — Telephone Encounter (Signed)
SOB is most likely related to afib.  I reviewed her note from EP at [facility] and she is going to have an ablation.  Please call her and find out how she is doing and when her ablation is

## 2014-01-02 NOTE — Telephone Encounter (Signed)
Pt is doing ok. To Dr Mayford Knife to make aware.

## 2014-01-02 NOTE — Telephone Encounter (Signed)
Pt has been scheduled on the 30th to have MRI and TEE is to be scheduled. She has not yet been scheduled for Ablation but they stated it would probably be on the first of October.

## 2014-01-06 ENCOUNTER — Ambulatory Visit (INDEPENDENT_AMBULATORY_CARE_PROVIDER_SITE_OTHER): Payer: Medicare Other | Admitting: Pharmacist

## 2014-01-06 DIAGNOSIS — I4891 Unspecified atrial fibrillation: Secondary | ICD-10-CM | POA: Diagnosis not present

## 2014-01-06 LAB — POCT INR: INR: 2.4

## 2014-01-10 ENCOUNTER — Encounter (HOSPITAL_COMMUNITY): Payer: Self-pay | Admitting: Emergency Medicine

## 2014-01-10 ENCOUNTER — Emergency Department (HOSPITAL_COMMUNITY)
Admission: EM | Admit: 2014-01-10 | Discharge: 2014-01-10 | Disposition: A | Discharge status: Home / Self Care | Payer: Medicare Other | Attending: Emergency Medicine | Admitting: Emergency Medicine

## 2014-01-10 DIAGNOSIS — Z9889 Other specified postprocedural states: Secondary | ICD-10-CM | POA: Insufficient documentation

## 2014-01-10 DIAGNOSIS — I10 Essential (primary) hypertension: Secondary | ICD-10-CM | POA: Diagnosis not present

## 2014-01-10 DIAGNOSIS — Z8709 Personal history of other diseases of the respiratory system: Secondary | ICD-10-CM | POA: Insufficient documentation

## 2014-01-10 DIAGNOSIS — E039 Hypothyroidism, unspecified: Secondary | ICD-10-CM | POA: Insufficient documentation

## 2014-01-10 DIAGNOSIS — I498 Other specified cardiac arrhythmias: Secondary | ICD-10-CM | POA: Insufficient documentation

## 2014-01-10 DIAGNOSIS — Z79899 Other long term (current) drug therapy: Secondary | ICD-10-CM | POA: Insufficient documentation

## 2014-01-10 DIAGNOSIS — Z792 Long term (current) use of antibiotics: Secondary | ICD-10-CM | POA: Insufficient documentation

## 2014-01-10 DIAGNOSIS — Z8739 Personal history of other diseases of the musculoskeletal system and connective tissue: Secondary | ICD-10-CM | POA: Diagnosis not present

## 2014-01-10 DIAGNOSIS — Z880 Allergy status to penicillin: Secondary | ICD-10-CM | POA: Insufficient documentation

## 2014-01-10 DIAGNOSIS — Z8669 Personal history of other diseases of the nervous system and sense organs: Secondary | ICD-10-CM | POA: Diagnosis not present

## 2014-01-10 DIAGNOSIS — K219 Gastro-esophageal reflux disease without esophagitis: Secondary | ICD-10-CM | POA: Diagnosis not present

## 2014-01-10 DIAGNOSIS — R000 Tachycardia, unspecified: Secondary | ICD-10-CM | POA: Diagnosis not present

## 2014-01-10 DIAGNOSIS — I471 Supraventricular tachycardia: Secondary | ICD-10-CM

## 2014-01-10 LAB — CBC WITH DIFFERENTIAL/PLATELET
BASOS ABS: 0.1 10*3/uL (ref 0.0–0.1)
Band Neutrophils: 0 % (ref 0–10)
Basophils Relative: 1 % (ref 0–1)
Blasts: 0 %
EOS PCT: 2 % (ref 0–5)
Eosinophils Absolute: 0.1 10*3/uL (ref 0.0–0.7)
HCT: 43.6 % (ref 36.0–46.0)
HEMOGLOBIN: 15.9 g/dL — AB (ref 12.0–15.0)
LYMPHS ABS: 2.3 10*3/uL (ref 0.7–4.0)
LYMPHS PCT: 34 % (ref 12–46)
MCH: 33.8 pg (ref 26.0–34.0)
MCHC: 36.5 g/dL — ABNORMAL HIGH (ref 30.0–36.0)
MCV: 92.8 fL (ref 78.0–100.0)
MONO ABS: 0.7 10*3/uL (ref 0.1–1.0)
MYELOCYTES: 0 %
Metamyelocytes Relative: 0 %
Monocytes Relative: 10 % (ref 3–12)
NEUTROS ABS: 3.7 10*3/uL (ref 1.7–7.7)
NEUTROS PCT: 53 % (ref 43–77)
Platelets: 244 10*3/uL (ref 150–400)
Promyelocytes Absolute: 0 %
RBC: 4.7 MIL/uL (ref 3.87–5.11)
RDW: 13.2 % (ref 11.5–15.5)
WBC: 6.9 10*3/uL (ref 4.0–10.5)
nRBC: 0 /100 WBC

## 2014-01-10 LAB — COMPREHENSIVE METABOLIC PANEL
ALT: 17 U/L (ref 0–35)
AST: 20 U/L (ref 0–37)
Albumin: 3.6 g/dL (ref 3.5–5.2)
Alkaline Phosphatase: 69 U/L (ref 39–117)
Anion gap: 14 (ref 5–15)
BUN: 14 mg/dL (ref 6–23)
CALCIUM: 8.6 mg/dL (ref 8.4–10.5)
CO2: 23 meq/L (ref 19–32)
CREATININE: 0.86 mg/dL (ref 0.50–1.10)
Chloride: 100 mEq/L (ref 96–112)
GFR calc Af Amer: 73 mL/min — ABNORMAL LOW (ref >90)
GFR, EST NON AFRICAN AMERICAN: 63 mL/min — AB (ref >90)
Glucose, Bld: 117 mg/dL — ABNORMAL HIGH (ref 70–99)
Potassium: 4.2 mEq/L (ref 3.7–5.3)
SODIUM: 137 meq/L (ref 137–147)
TOTAL PROTEIN: 7.4 g/dL (ref 6.0–8.3)
Total Bilirubin: 0.3 mg/dL (ref 0.3–1.2)

## 2014-01-10 LAB — TROPONIN I

## 2014-01-10 LAB — MAGNESIUM: MAGNESIUM: 1.9 mg/dL (ref 1.5–2.5)

## 2014-01-10 NOTE — ED Notes (Signed)
The pt has had tachycardia for the past 3-4 hours.  Hx of af.  She is due for an ablatiion next week.  She has had some anterior neck discomfort with the tachycardia.  She was sitting quietly  Tonight when it started

## 2014-01-10 NOTE — ED Notes (Signed)
EDP at bedside  

## 2014-01-10 NOTE — Discharge Instructions (Signed)
Cardiac Ablation  Cardiac ablation is a procedure to disable a small amount of heart tissue in very specific places. The heart has many electrical connections. Sometimes these connections are abnormal and can cause the heart to beat very fast or irregularly. By disabling some of the problem areas, heart rhythm can be improved or made normal. Ablation is done for people who:   · Have Wolff-Parkinson-White syndrome.    · Have other fast heart rhythms (tachycardia).    · Have taken medicines for an abnormal heart rhythm (arrhythmia) that resulted in:    ¨ No success.    ¨ Side effects.    · May have a high-risk heartbeat that could result in death.    LET YOUR HEALTH CARE PROVIDER KNOW ABOUT:   · Any allergies you have or any previous reactions you have had to X-ray dye, food (such as seafood), medicine, or tape.    · All medicines you are taking, including vitamins, herbs, eye drops, creams, and over-the-counter medicines.    · Previous problems you or members of your family have had with the use of anesthetics.    · Any blood disorders you have.    · Previous surgeries or procedures (such as a kidney transplant) you have had.    · Medical conditions you have (such as kidney failure).    RISKS AND COMPLICATIONS  Generally, cardiac ablation is a safe procedure. However, problems can occur and include:   · Increased risk of cancer. Depending on how long it takes to do the ablation, the dose of radiation can be high.   · Bruising and bleeding where a thin, flexible tube (catheter) was inserted during the procedure.    · Bleeding into the chest, especially into the sac that surrounds the heart (serious).  · Need for a permanent pacemaker if the normal electrical system is damaged.    · The procedure may not be fully effective, and this may not be recognized for months. Repeat ablation procedures are sometimes required.  BEFORE THE PROCEDURE   · Follow any instructions from your health care provider regarding eating and  drinking before the procedure.    · Take your medicines as directed at regular times with water, unless instructed otherwise by your health care provider. If you are taking diabetes medicine, including insulin, ask how you are to take it and if there are any special instructions you should follow. It is common to adjust insulin dosing the day of the ablation.    PROCEDURE  · An ablation is usually performed in a catheterization laboratory with the guidance of fluoroscopy. Fluoroscopy is a type of X-ray that helps your health care provider see images of your heart during the procedure.    · An ablation is a minimally invasive procedure. This means a small cut (incision) is made in either your neck or groin. Your health care provider will decide where to make the incision based on your medical history and physical exam.  · An IV tube will be started before the procedure begins. You will be given an anesthetic or medicine to help you relax (sedative).  · The skin on your neck or groin will be numbed. A needle will be inserted into a large vein in your neck or groin and catheters will be threaded to your heart.  · A special dye that shows up on fluoroscopy pictures may be injected through the catheter. The dye helps your health care provider see the area of the heart that needs treatment.  · The catheter has electrodes on the tip. When the area of heart   tissue that is causing the arrhythmia is found, the catheter tip will send an electrical current to the area and "scar" the tissue.  Three types of energy can be used to ablate the heart tissue:    ¨ Heat (radiofrequency energy).    ¨ Laser energy.    ¨ Extreme cold (cryoablation).    · When the area of the heart has been ablated, the catheter will be taken out. Pressure will be held on the insertion site. This will help the insertion site clot and keep it from bleeding. A bandage will be placed on the insertion site.    AFTER THE PROCEDURE   · After the procedure, you  will be taken to a recovery area where your vital signs (blood pressure, heart rate, and breathing) will be monitored. The insertion site will also be monitored for bleeding.    · You will need to lie still for 4-6 hours. This is to ensure you do not bleed from the catheter insertion site.    Document Released: 08/21/2008 Document Revised: 08/19/2013 Document Reviewed: 08/27/2012  Exit[employer]® Patient Information ©2015 [employer]. This information is not intended to replace advice given to you by your health care provider. Make sure you discuss any questions you have with your health care provider.

## 2014-01-10 NOTE — ED Notes (Signed)
Cardiac rhythm printed and given to EDP. Heart rate increased to 145 and decreased back to 80. Doctor at bedside.

## 2014-01-10 NOTE — ED Provider Notes (Signed)
CSN: [id number]     Arrival date & time 01/10/14  2014 History   First MD Initiated Contact with Patient 01/10/14 2039     Chief Complaint  Patient presents with  . Tachycardia     (Consider location/radiation/quality/duration/timing/severity/associated sxs/prior Treatment) Patient is a 80 y.o. female presenting with general illness. The history is provided by the patient.  Illness Location:  Anterior neck Quality:  Discomofrt, similar to previous times when she has had tachycardia Severity:  Mild Onset quality:  Gradual Duration:  5 hours Timing:  Constant Progression:  Unchanged Chronicity:  Recurrent Context:  Pt has hx of Afib, on coumadin, dilt, flecainide, sees a Cardiologist at [facility] and scheduled for an ablation next week. States that feels similar to previous tachycardia where she has anterior neck discomfort. No chest pain, SOB, abd pain, n/vd/ Relieved by:  None Worsened by:  None Ineffective treatments:  None tried Associated symptoms: no abdominal pain, no chest pain, no congestion, no diarrhea, no ear pain, no fatigue, no fever, no loss of consciousness, no nausea, no rash, no rhinorrhea, no shortness of breath, no vomiting and no wheezing   Risk factors:  Hx of Afib   Past Medical History  Diagnosis Date  . GERD (gastroesophageal reflux disease)   . Hypertension   . Seasonal allergies   . Lower leg edema   . Hypothyroidism   . Restless leg syndrome   . DJD (degenerative joint disease)   . Diverticular disease   . Dysphagia   . Hiatal hernia   . PAF (paroxysmal atrial fibrillation)     s/p ablation done and also pill in the pocket flecainide   Past Surgical History  Procedure Laterality Date  . Rhinoplasty    . Abdominal hysterectomy    . Appendectomy    . Tubal ligation    . Eye surgery      cataracat  . Cardioversion      x3  . Breast surgery      left lumpectomy  . Tonsillectomy  1942  . Thyroidectomy  1973  . Bladder polyps    . Rotator cuff  repair    . Esophagogastroduodenoscopy  03/03/2011    Procedure: ESOPHAGOGASTRODUODENOSCOPY (EGD);  Surgeon: Charolett Bumpers, MD;  Location: [facility];  Service: Endoscopy;  Laterality: N/A;  . Balloon dilation  03/03/2011    Procedure: BALLOON DILATION;  Surgeon: Charolett Bumpers, MD;  Location: [facility];  Service: Endoscopy;  Laterality: N/A;  . Atrial fibrillation ablation  09-06-13    2'14 -[facility]  . Cataract extraction Left   . Colonoscopy with propofol N/A 09/24/2013    Procedure: COLONOSCOPY WITH PROPOFOL;  Surgeon: Charolett Bumpers, MD;  Location: [facility];  Service: Endoscopy;  Laterality: N/A;   Family History  Problem Relation Age of Onset  . Anesthesia problems Neg Hx   . Hypotension Neg Hx   . Malignant hyperthermia Neg Hx   . Pseudochol deficiency Neg Hx   . Transient ischemic attack Mother   . Heart failure Father   . CVA Father   . Lung cancer Father   . Hypertension Sister   . Cancer Brother    History  Substance Use Topics  . Smoking status: Never Smoker   . Smokeless tobacco: Not on file  . Alcohol Use: No     Comment: rare wine   OB History   Grav Para Term Preterm Abortions TAB SAB Ect Mult Living  Review of Systems  Constitutional: Negative for fever, activity change, appetite change and fatigue.  HENT: Negative for congestion, ear pain and rhinorrhea.   Eyes: Negative for discharge, redness and itching.  Respiratory: Negative for shortness of breath and wheezing.   Cardiovascular: Positive for palpitations. Negative for chest pain.  Gastrointestinal: Negative for nausea, vomiting, abdominal pain and diarrhea.  Genitourinary: Negative for dysuria and hematuria.  Musculoskeletal: Negative for back pain.  Skin: Negative for rash and wound.  Neurological: Negative for loss of consciousness and syncope.      Allergies  Tramadol; Codeine; Erythromycin; Penicillins; Tetracyclines & related; Diltiazem; and  Tetracycline  Home Medications   Prior to Admission medications   Medication Sig Start Date End Date Taking? Authorizing Provider  acetaminophen (TYLENOL) 500 MG tablet Take 500 mg by mouth every 6 (six) hours as needed for mild pain or headache.   Yes Historical Provider, MD  butalbital-acetaminophen-caffeine (FIORICET WITH CODEINE) 50-325-40-30 MG per capsule Take 1 capsule by mouth every 4 (four) hours as needed. For migraines   Yes Historical Provider, MD  Calcium Citrate-Vitamin D (CITRACAL + D PO) Take 1 tablet by mouth 2 (two) times daily.   Yes Historical Provider, MD  cholecalciferol (VITAMIN D) 1000 UNITS tablet Take 1,000 Units by mouth daily.   Yes Historical Provider, MD  diltiazem (CARDIZEM) 30 MG tablet Take 30 mg by mouth daily as needed (for heart rhythm).    Yes Historical Provider, MD  esomeprazole (NEXIUM) 20 MG capsule Take 20 mg by mouth daily at 12 noon.   Yes Historical Provider, MD  estradiol (ESTRACE) 1 MG tablet Take 1 mg by mouth daily.   Yes Historical Provider, MD  famotidine (PEPCID AC) 10 MG chewable tablet Chew 10 mg by mouth 2 (two) times daily as needed for heartburn.    Yes Historical Provider, MD  flecainide (TAMBOCOR) 50 MG tablet Take 50 mg by mouth 2 (two) times daily.   Yes Historical Provider, MD  furosemide (LASIX) 40 MG tablet Take 40 mg by mouth every morning.    Yes Historical Provider, MD  hydroxypropyl methylcellulose (ISOPTO TEARS) 2.5 % ophthalmic solution Place 1 drop into both eyes 3 (three) times daily as needed for dry eyes.   Yes Historical Provider, MD  levothyroxine (SYNTHROID, LEVOTHROID) 125 MCG tablet Take 125 mcg by mouth daily before breakfast. BRAND ONLY   Yes Historical Provider, MD  LORazepam (ATIVAN) 1 MG tablet Take 0.5-1 mg by mouth at bedtime as needed. For sleep   Yes Historical Provider, MD  Magnesium 500 MG CAPS Take 500 mg by mouth daily.   Yes Historical Provider, MD  nebivolol (BYSTOLIC) 5 MG tablet Take 5 mg by mouth  every evening.    Yes Historical Provider, MD  potassium chloride (K-DUR) 10 MEQ tablet Take 1 tablet (10 mEq total) by mouth daily. 12/27/13  Yes Quintella Reichert, MD  warfarin (COUMADIN) 5 MG tablet Take 2.5-5 mg by mouth daily. 5 mg Sunday, Tuesday, Thursday, Saturday. 2.5mg  Monday, Wednesday and Friday   Yes Historical Provider, MD   BP 119/64  Pulse 75  Temp(Src) 97.9 F (36.6 C)  Resp 17  Ht  (1.753 m)  Wt 213 lb (96.616 kg)  BMI 31.44 kg/m2  SpO2 95% Physical Exam  Constitutional: She is oriented to person, place, and time. She appears well-developed and well-nourished. No distress.  NAd, well appearing  HENT:  Head: Normocephalic and atraumatic.  Mouth/Throat: Oropharynx is clear and moist. No oropharyngeal exudate.  Eyes: Conjunctivae and EOM are normal. Pupils are equal, round, and reactive to light. Right eye exhibits no discharge. Left eye exhibits no discharge. No scleral icterus.  Neck: Normal range of motion. Neck supple.  Cardiovascular: Normal heart sounds.   No murmur heard. Tachy, regular  Pulmonary/Chest: Effort normal and breath sounds normal. No respiratory distress. She has no wheezes. She has no rales.  Abdominal: Soft. She exhibits no distension and no mass. There is no tenderness.  Neurological: She is alert and oriented to person, place, and time. She exhibits normal muscle tone. Coordination normal.  Skin: Skin is warm. No rash noted. She is not diaphoretic.    ED Course  Procedures (including critical care time) Labs Review Labs Reviewed  CBC WITH DIFFERENTIAL - Abnormal; Notable for the following:    Hemoglobin 15.9 (*)    MCHC 36.5 (*)    All other components within normal limits  COMPREHENSIVE METABOLIC PANEL - Abnormal; Notable for the following:    Glucose, Bld 117 (*)    GFR calc non Af Amer 63 (*)    GFR calc Af Amer 73 (*)    All other components within normal limits  TROPONIN I  MAGNESIUM  MAGNESIUM    Imaging Review No results  found.   EKG Interpretation   Date/Time:  Friday January 10 2014 20:33:43 EDT Ventricular Rate:  171 PR Interval:    QRS Duration: 88 QT Interval:  280 QTC Calculation: 472 R Axis:   63 Text Interpretation:  Supraventricular tachycardia Anterior infarct , age  undetermined Abnormal ECG Confirmed by BEATON  MD, ROBERT ([id number]) on  01/10/2014 8:44:45 PM      MDM   MDM: Pt is a 80 y.o WF w/ PMHx of HTN, Afib w/ cc: palpitations. Hx of afib, sees Cardiologist at [facility], scheduled for ablation next week. States that she has paroxysmal AF and that when she gets it she gets palpitations and neck discomfort. Similar to prior. No chest pain, SOB, f/c, abd pain, n/v/d. 4 hours ago, tried to wait it out. Here in SVT on EKG. Stable, no chest pain, GCS 15, noraml BP. During IV placement, SVT broke. NSR. No sxs. Labs and Trop neg. Had periods where she briefly went Tachy but no further runs of SVT or obvious Afib. Cards spoke to patient, and since patient is on maximal medical therapy and has hx of ablation next week, no additional intervention. SVT likely not ischemic, likely electrical, unlikely ACS. Discharged home. Follow up with primary cards. Return to ED as needed.  Final diagnoses:  SVT (supraventricular tachycardia)   Discharge Medication List as of 01/10/2014 10:36 PM     Regular Cardiologist  Call   [facility] EMERGENCY DEPARTMENT [address] [id number] [place] [address] [phone]  As needed   Discharged  Pilar Jarvis, MD 01/10/14 [phone]

## 2014-01-10 NOTE — ED Notes (Signed)
EKG completed and given to EDP.  

## 2014-01-10 NOTE — ED Provider Notes (Signed)
I saw and evaluated the patient, reviewed the resident's note and I agree with the findings and plan.   .Face to face Exam:  General:  Awake HEENT:  Atraumatic Resp:  Normal effort Abd:  Nondistended Neuro:No focal weakness   EKG was discussed and reviewed with resident  Nelia Shi, MD 01/10/14 2355

## 2014-01-13 ENCOUNTER — Ambulatory Visit (INDEPENDENT_AMBULATORY_CARE_PROVIDER_SITE_OTHER): Payer: Medicare Other | Admitting: *Deleted

## 2014-01-13 DIAGNOSIS — I4891 Unspecified atrial fibrillation: Secondary | ICD-10-CM

## 2014-01-13 LAB — POCT INR: INR: 3.1

## 2014-01-15 DIAGNOSIS — Z8679 Personal history of other diseases of the circulatory system: Secondary | ICD-10-CM | POA: Diagnosis not present

## 2014-01-15 DIAGNOSIS — R002 Palpitations: Secondary | ICD-10-CM | POA: Diagnosis not present

## 2014-01-15 DIAGNOSIS — R5383 Other fatigue: Secondary | ICD-10-CM | POA: Diagnosis not present

## 2014-01-15 DIAGNOSIS — Z79899 Other long term (current) drug therapy: Secondary | ICD-10-CM | POA: Diagnosis not present

## 2014-01-15 DIAGNOSIS — R42 Dizziness and giddiness: Secondary | ICD-10-CM | POA: Diagnosis not present

## 2014-01-15 DIAGNOSIS — R5381 Other malaise: Secondary | ICD-10-CM | POA: Diagnosis not present

## 2014-01-15 DIAGNOSIS — I4891 Unspecified atrial fibrillation: Secondary | ICD-10-CM | POA: Diagnosis not present

## 2014-01-15 DIAGNOSIS — I517 Cardiomegaly: Secondary | ICD-10-CM | POA: Diagnosis not present

## 2014-01-16 DIAGNOSIS — R600 Localized edema: Secondary | ICD-10-CM | POA: Diagnosis not present

## 2014-01-16 DIAGNOSIS — I10 Essential (primary) hypertension: Secondary | ICD-10-CM | POA: Diagnosis not present

## 2014-01-16 DIAGNOSIS — Z7901 Long term (current) use of anticoagulants: Secondary | ICD-10-CM | POA: Diagnosis not present

## 2014-01-16 DIAGNOSIS — Z8669 Personal history of other diseases of the nervous system and sense organs: Secondary | ICD-10-CM | POA: Diagnosis not present

## 2014-01-16 DIAGNOSIS — I480 Paroxysmal atrial fibrillation: Secondary | ICD-10-CM | POA: Diagnosis not present

## 2014-01-16 DIAGNOSIS — R0989 Other specified symptoms and signs involving the circulatory and respiratory systems: Secondary | ICD-10-CM | POA: Diagnosis not present

## 2014-01-16 DIAGNOSIS — Z79899 Other long term (current) drug therapy: Secondary | ICD-10-CM | POA: Diagnosis not present

## 2014-01-16 DIAGNOSIS — I4892 Unspecified atrial flutter: Secondary | ICD-10-CM | POA: Diagnosis not present

## 2014-01-16 DIAGNOSIS — R140 Abdominal distension (gaseous): Secondary | ICD-10-CM | POA: Diagnosis not present

## 2014-01-16 DIAGNOSIS — I4891 Unspecified atrial fibrillation: Secondary | ICD-10-CM | POA: Diagnosis not present

## 2014-01-16 DIAGNOSIS — K219 Gastro-esophageal reflux disease without esophagitis: Secondary | ICD-10-CM | POA: Diagnosis not present

## 2014-01-16 DIAGNOSIS — G2581 Restless legs syndrome: Secondary | ICD-10-CM | POA: Diagnosis not present

## 2014-01-16 DIAGNOSIS — R001 Bradycardia, unspecified: Secondary | ICD-10-CM | POA: Diagnosis not present

## 2014-01-16 DIAGNOSIS — Z791 Long term (current) use of non-steroidal anti-inflammatories (NSAID): Secondary | ICD-10-CM | POA: Diagnosis not present

## 2014-01-17 DIAGNOSIS — K219 Gastro-esophageal reflux disease without esophagitis: Secondary | ICD-10-CM | POA: Diagnosis not present

## 2014-01-17 DIAGNOSIS — I480 Paroxysmal atrial fibrillation: Secondary | ICD-10-CM | POA: Diagnosis not present

## 2014-01-17 DIAGNOSIS — R001 Bradycardia, unspecified: Secondary | ICD-10-CM | POA: Diagnosis not present

## 2014-01-17 DIAGNOSIS — I10 Essential (primary) hypertension: Secondary | ICD-10-CM | POA: Diagnosis not present

## 2014-01-17 DIAGNOSIS — R0989 Other specified symptoms and signs involving the circulatory and respiratory systems: Secondary | ICD-10-CM | POA: Diagnosis not present

## 2014-01-17 DIAGNOSIS — Z8669 Personal history of other diseases of the nervous system and sense organs: Secondary | ICD-10-CM | POA: Diagnosis not present

## 2014-01-20 ENCOUNTER — Ambulatory Visit (INDEPENDENT_AMBULATORY_CARE_PROVIDER_SITE_OTHER): Payer: Medicare Other | Admitting: Pharmacist Clinician (PhC)/ Clinical Pharmacy Specialist

## 2014-01-20 DIAGNOSIS — I4891 Unspecified atrial fibrillation: Secondary | ICD-10-CM | POA: Diagnosis not present

## 2014-01-20 LAB — POCT INR: INR: 2.5

## 2014-01-23 ENCOUNTER — Ambulatory Visit (INDEPENDENT_AMBULATORY_CARE_PROVIDER_SITE_OTHER): Payer: Medicare Other

## 2014-01-23 DIAGNOSIS — E038 Other specified hypothyroidism: Secondary | ICD-10-CM | POA: Diagnosis not present

## 2014-01-23 DIAGNOSIS — I4891 Unspecified atrial fibrillation: Secondary | ICD-10-CM

## 2014-01-23 DIAGNOSIS — E063 Autoimmune thyroiditis: Secondary | ICD-10-CM | POA: Diagnosis not present

## 2014-01-23 LAB — POCT INR: INR: 2

## 2014-01-27 ENCOUNTER — Ambulatory Visit (INDEPENDENT_AMBULATORY_CARE_PROVIDER_SITE_OTHER): Payer: Medicare Other | Admitting: Pharmacist Clinician (PhC)/ Clinical Pharmacy Specialist

## 2014-01-27 DIAGNOSIS — I4891 Unspecified atrial fibrillation: Secondary | ICD-10-CM | POA: Diagnosis not present

## 2014-01-27 LAB — POCT INR: INR: 2.2

## 2014-01-31 ENCOUNTER — Ambulatory Visit (INDEPENDENT_AMBULATORY_CARE_PROVIDER_SITE_OTHER): Payer: Medicare Other | Admitting: Pharmacist Clinician (PhC)/ Clinical Pharmacy Specialist

## 2014-01-31 DIAGNOSIS — I4891 Unspecified atrial fibrillation: Secondary | ICD-10-CM

## 2014-01-31 LAB — POCT INR: INR: 3.1

## 2014-02-10 ENCOUNTER — Ambulatory Visit (INDEPENDENT_AMBULATORY_CARE_PROVIDER_SITE_OTHER): Payer: Medicare Other | Admitting: Pharmacist Clinician (PhC)/ Clinical Pharmacy Specialist

## 2014-02-10 DIAGNOSIS — I4891 Unspecified atrial fibrillation: Secondary | ICD-10-CM | POA: Diagnosis not present

## 2014-02-10 DIAGNOSIS — Z23 Encounter for immunization: Secondary | ICD-10-CM | POA: Diagnosis not present

## 2014-02-10 LAB — POCT INR: INR: 4.5

## 2014-02-11 ENCOUNTER — Ambulatory Visit: Payer: Medicare Other | Admitting: Cardiology

## 2014-02-24 ENCOUNTER — Ambulatory Visit (INDEPENDENT_AMBULATORY_CARE_PROVIDER_SITE_OTHER): Payer: Medicare Other | Admitting: Pharmacist Clinician (PhC)/ Clinical Pharmacy Specialist

## 2014-02-24 DIAGNOSIS — I4891 Unspecified atrial fibrillation: Secondary | ICD-10-CM | POA: Diagnosis not present

## 2014-02-24 LAB — POCT INR: INR: 2.7

## 2014-02-27 DIAGNOSIS — I440 Atrioventricular block, first degree: Secondary | ICD-10-CM | POA: Diagnosis not present

## 2014-02-27 DIAGNOSIS — I480 Paroxysmal atrial fibrillation: Secondary | ICD-10-CM | POA: Diagnosis not present

## 2014-03-07 ENCOUNTER — Ambulatory Visit (INDEPENDENT_AMBULATORY_CARE_PROVIDER_SITE_OTHER): Payer: Medicare Other | Admitting: Cardiology

## 2014-03-07 ENCOUNTER — Encounter: Payer: Self-pay | Admitting: Cardiology

## 2014-03-07 VITALS — BP 133/76 | HR 74 | Ht 69.0 in | Wt 213.0 lb

## 2014-03-07 DIAGNOSIS — I10 Essential (primary) hypertension: Secondary | ICD-10-CM

## 2014-03-07 DIAGNOSIS — R0602 Shortness of breath: Secondary | ICD-10-CM | POA: Diagnosis not present

## 2014-03-07 DIAGNOSIS — Z9229 Personal history of other drug therapy: Secondary | ICD-10-CM | POA: Diagnosis not present

## 2014-03-07 DIAGNOSIS — I480 Paroxysmal atrial fibrillation: Secondary | ICD-10-CM | POA: Diagnosis not present

## 2014-03-07 LAB — BASIC METABOLIC PANEL
BUN: 14 mg/dL (ref 6–23)
CHLORIDE: 104 meq/L (ref 96–112)
CO2: 26 meq/L (ref 19–32)
Calcium: 8.9 mg/dL (ref 8.4–10.5)
Creatinine, Ser: 0.9 mg/dL (ref 0.4–1.2)
GFR: 67.53 mL/min (ref >60.00)
Glucose, Bld: 97 mg/dL (ref 70–99)
POTASSIUM: 4.2 meq/L (ref 3.5–5.1)
Sodium: 136 mEq/L (ref 135–145)

## 2014-03-07 LAB — CBC WITH DIFFERENTIAL/PLATELET
BASOS PCT: 0.8 % (ref 0.0–3.0)
Basophils Absolute: 0 10*3/uL (ref 0.0–0.1)
EOS PCT: 2 % (ref 0.0–5.0)
Eosinophils Absolute: 0.1 10*3/uL (ref 0.0–0.7)
HEMATOCRIT: 42.4 % (ref 36.0–46.0)
Hemoglobin: 14.3 g/dL (ref 12.0–15.0)
LYMPHS ABS: 2 10*3/uL (ref 0.7–4.0)
Lymphocytes Relative: 33.4 % (ref 12.0–46.0)
MCHC: 33.8 g/dL (ref 30.0–36.0)
MCV: 97 fl (ref 78.0–100.0)
MONO ABS: 0.8 10*3/uL (ref 0.1–1.0)
Monocytes Relative: 12.8 % — ABNORMAL HIGH (ref 3.0–12.0)
NEUTROS ABS: 3.1 10*3/uL (ref 1.4–7.7)
Neutrophils Relative %: 51 % (ref 43.0–77.0)
Platelets: 238 10*3/uL (ref 150.0–400.0)
RBC: 4.37 Mil/uL (ref 3.87–5.11)
RDW: 13.1 % (ref 11.5–15.5)
WBC: 6.1 10*3/uL (ref 4.0–10.5)

## 2014-03-07 LAB — BRAIN NATRIURETIC PEPTIDE: Pro B Natriuretic peptide (BNP): 70 pg/mL (ref 0.0–100.0)

## 2014-03-07 NOTE — Patient Instructions (Addendum)
Your physician recommends that you have lab work TODAY (BMET, CBC, BNP).  Your physician has requested that you have an echocardiogram. Echocardiography is a painless test that uses sound waves to create images of your heart. It provides your doctor with information about the size and shape of your heart and how well your heart's chambers and valves are working. This procedure takes approximately one hour. There are no restrictions for this procedure.  Your physician recommends that you continue on your current medications as directed. Please refer to the Current Medication list given to you today.  Your physician wants you to follow-up in: 6 months with Dr. Mayford Knife. You will receive a reminder letter in the mail two months in advance. If you don't receive a letter, please call our office to schedule the follow-up appointment.

## 2014-03-07 NOTE — Progress Notes (Signed)
[address] [place], [place]  [address] Phone: [phone] Fax:  [phone]  Date:  03/07/2014   ID:  Natasha [name], DOB 16-Sep-1932, MRN [id number]  PCP:  Pearla Dubonnet, MD  Cardiologist:  Armanda Magic, MD    History of Present Illness: Natasha [name] is a 80 y.o. female with a history of PAF, HTN and chronic systemic anticoagulation who presents today for followup. She is doing well. She denies any chest pain, LE edema, dizziness, palpitations or syncope. When I last saw her she was complaining of some CP associated with her breakthrough of PAF and progressive DOE and underwent Lexiscan myoview which showed no ischemia. She now presents back for followup. Since I saw her last, she had another prolonged afib ablation.  Early on after her ablation she had some PAF but that has settled down significantly.  She is still complaining of SOB and feeling very fatigued with exertion.  She denies any chest pain.    Wt Readings from Last 3 Encounters:  03/07/14 213 lb (96.616 kg)  01/10/14 213 lb (96.616 kg)  12/17/13 214 lb (97.07 kg)     Past Medical History  Diagnosis Date  . GERD (gastroesophageal reflux disease)   . Hypertension   . Seasonal allergies   . Lower leg edema   . Hypothyroidism   . Restless leg syndrome   . DJD (degenerative joint disease)   . Diverticular disease   . Dysphagia   . Hiatal hernia   . PAF (paroxysmal atrial fibrillation)     s/p ablation done and also pill in the pocket flecainide    Current Outpatient Prescriptions  Medication Sig Dispense Refill  . acetaminophen (TYLENOL) 500 MG tablet Take 500 mg by mouth every 6 (six) hours as needed for mild pain or headache.    . butalbital-acetaminophen-caffeine (FIORICET WITH CODEINE) 50-325-40-30 MG per capsule Take 1 capsule by mouth every 4 (four) hours as needed. For migraines    . Calcium Citrate-Vitamin D (CITRACAL + D PO) Take 1 tablet by mouth 2 (two) times daily.    .  cholecalciferol (VITAMIN D) 1000 UNITS tablet Take 1,000 Units by mouth daily.    Marland Kitchen diltiazem (CARDIZEM) 30 MG tablet Take 30 mg by mouth daily as needed (for heart rhythm).     . esomeprazole (NEXIUM) 20 MG capsule Take 20 mg by mouth daily.     Marland Kitchen estradiol (ESTRACE) 1 MG tablet Take 1 mg by mouth daily.    . famotidine (PEPCID AC) 10 MG chewable tablet Chew 10 mg by mouth 2 (two) times daily as needed for heartburn.     . flecainide (TAMBOCOR) 50 MG tablet Take 50 mg by mouth 2 (two) times daily.    . furosemide (LASIX) 40 MG tablet Take 40 mg by mouth every morning.     . hydroxypropyl methylcellulose (ISOPTO TEARS) 2.5 % ophthalmic solution Place 1 drop into both eyes 3 (three) times daily as needed for dry eyes.    Marland Kitchen levothyroxine (SYNTHROID, LEVOTHROID) 125 MCG tablet Take 125 mcg by mouth daily before breakfast. BRAND ONLY    . LORazepam (ATIVAN) 1 MG tablet Take 0.5-1 mg by mouth at bedtime as needed. For sleep    . Magnesium 500 MG CAPS Take 500 mg by mouth daily.    . nebivolol (BYSTOLIC) 5 MG tablet Take 5 mg by mouth every evening.     . potassium chloride (K-DUR) 10 MEQ tablet Take 1 tablet (10 mEq  total) by mouth daily. 30 tablet 3  . warfarin (COUMADIN) 5 MG tablet Take 2.5-5 mg by mouth daily. 5 mg Sunday, Tuesday, Thursday, Saturday. 2.5mg  Monday, Wednesday and Friday     No current facility-administered medications for this visit.    Allergies:    Allergies  Allergen Reactions  . Tramadol Nausea Only  . Codeine Nausea And Vomiting and Nausea Only  . Erythromycin Other (See Comments) and Hives    Flushing on face  . Other Other (See Comments)    ECG leads cause skin irritation.  Marland Kitchen. Penicillins Hives  . Tetracyclines & Related Other (See Comments)    "Sores on legs"  . Diltiazem Rash and Other (See Comments)    Rash with long acting form  . Tetracycline Rash    Social History:  The patient  reports that she has never smoked. She does not have any smokeless tobacco  history on file. She reports that she does not drink alcohol or use illicit drugs.   Family History:  The patient's family history includes CVA in her father; Cancer in her brother; Heart failure in her father; Hypertension in her sister; Lung cancer in her father; Transient ischemic attack in her mother. There is no history of Anesthesia problems, Hypotension, Malignant hyperthermia, or Pseudochol deficiency.   ROS:  Please see the history of present illness.      All other systems reviewed and negative.   PHYSICAL EXAM: VS:  BP 133/76 mmHg  Pulse 74  Ht 5\' 9"  (1.753 m)  Wt 213 lb (96.616 kg)  BMI 31.44 kg/m2 Well nourished, well developed, in no acute distress HEENT: normal Neck: no JVD Cardiac:  normal S1, S2; RRR; no murmur Lungs:  clear to auscultation bilaterally, no wheezing, rhonchi or rales Abd: soft, nontender, no hepatomegaly Ext: no edema Skin: warm and dry Neuro:  CNs 2-12 intact, no focal abnormalities noted  EKG:   NSR at 71bpm with no ST changes    ASSESSMENT AND PLAN:  1. PAF - maintaining NSR - continue Coumadin/Bystolic/cardizem/Flecainide 2. Chronic systemic anticoagulation 3. HTN controlled - continue Bystolic       4. SOB with fatigue since the ablation. No ischemia on stress myoview.  I will check a BNP and BMET.  I will repeat an echo post ablation to reassess LVF and rule out pericardial effusion.  Check CBC.  The beta blocker could be causing some of her fatigue but she needs to be on it for diastolic dysfunction and suppression of afib.         5. Leg fatigue with walking with reduced pulses on exam  - bilateral LE arterial dopplers were normal  Followup with me in 6 months   Signed, Armanda Magicraci Andranik Jeune, MD [facility] 03/07/2014 2:52 PM

## 2014-03-17 ENCOUNTER — Ambulatory Visit (INDEPENDENT_AMBULATORY_CARE_PROVIDER_SITE_OTHER): Payer: Medicare Other | Admitting: Pharmacist Clinician (PhC)/ Clinical Pharmacy Specialist

## 2014-03-17 DIAGNOSIS — I4891 Unspecified atrial fibrillation: Secondary | ICD-10-CM

## 2014-03-17 LAB — POCT INR: INR: 2.7

## 2014-03-18 ENCOUNTER — Other Ambulatory Visit (HOSPITAL_COMMUNITY): Payer: Medicare Other

## 2014-03-18 DIAGNOSIS — J387 Other diseases of larynx: Secondary | ICD-10-CM | POA: Diagnosis not present

## 2014-03-18 DIAGNOSIS — R490 Dysphonia: Secondary | ICD-10-CM | POA: Diagnosis not present

## 2014-03-19 ENCOUNTER — Ambulatory Visit (HOSPITAL_COMMUNITY): Payer: Medicare Other | Attending: Cardiology

## 2014-03-19 DIAGNOSIS — I10 Essential (primary) hypertension: Secondary | ICD-10-CM | POA: Diagnosis not present

## 2014-03-19 DIAGNOSIS — R0602 Shortness of breath: Secondary | ICD-10-CM | POA: Insufficient documentation

## 2014-03-19 NOTE — Progress Notes (Signed)
2D Echo completed. 03/19/2014 

## 2014-04-14 ENCOUNTER — Ambulatory Visit (INDEPENDENT_AMBULATORY_CARE_PROVIDER_SITE_OTHER): Payer: Medicare Other | Admitting: Pharmacist Clinician (PhC)/ Clinical Pharmacy Specialist

## 2014-04-14 DIAGNOSIS — I4891 Unspecified atrial fibrillation: Secondary | ICD-10-CM | POA: Diagnosis not present

## 2014-04-14 LAB — POCT INR: INR: 3.3

## 2014-04-15 DIAGNOSIS — I480 Paroxysmal atrial fibrillation: Secondary | ICD-10-CM | POA: Diagnosis not present

## 2014-04-15 DIAGNOSIS — Z8679 Personal history of other diseases of the circulatory system: Secondary | ICD-10-CM | POA: Diagnosis not present

## 2014-04-21 ENCOUNTER — Other Ambulatory Visit: Payer: Self-pay

## 2014-04-21 DIAGNOSIS — Z1231 Encounter for screening mammogram for malignant neoplasm of breast: Secondary | ICD-10-CM

## 2014-04-24 ENCOUNTER — Other Ambulatory Visit: Payer: Self-pay | Admitting: Internal Medicine

## 2014-04-24 ENCOUNTER — Ambulatory Visit
Admission: RE | Admit: 2014-04-24 | Discharge: 2014-04-24 | Disposition: A | Discharge status: Home / Self Care | Payer: Medicare Other | Source: Ambulatory Visit | Attending: Internal Medicine | Admitting: Internal Medicine

## 2014-04-24 DIAGNOSIS — R5382 Chronic fatigue, unspecified: Secondary | ICD-10-CM | POA: Diagnosis not present

## 2014-04-24 DIAGNOSIS — G26 Extrapyramidal and movement disorders in diseases classified elsewhere: Secondary | ICD-10-CM | POA: Diagnosis not present

## 2014-04-24 DIAGNOSIS — R0609 Other forms of dyspnea: Secondary | ICD-10-CM | POA: Diagnosis not present

## 2014-04-24 DIAGNOSIS — K449 Diaphragmatic hernia without obstruction or gangrene: Secondary | ICD-10-CM | POA: Diagnosis not present

## 2014-04-24 DIAGNOSIS — E039 Hypothyroidism, unspecified: Secondary | ICD-10-CM | POA: Diagnosis not present

## 2014-04-24 DIAGNOSIS — K219 Gastro-esophageal reflux disease without esophagitis: Secondary | ICD-10-CM | POA: Diagnosis not present

## 2014-04-24 DIAGNOSIS — R0602 Shortness of breath: Secondary | ICD-10-CM | POA: Diagnosis not present

## 2014-04-24 DIAGNOSIS — Z23 Encounter for immunization: Secondary | ICD-10-CM | POA: Diagnosis not present

## 2014-04-24 DIAGNOSIS — Z1389 Encounter for screening for other disorder: Secondary | ICD-10-CM | POA: Diagnosis not present

## 2014-04-24 DIAGNOSIS — Z0001 Encounter for general adult medical examination with abnormal findings: Secondary | ICD-10-CM | POA: Diagnosis not present

## 2014-04-24 DIAGNOSIS — I10 Essential (primary) hypertension: Secondary | ICD-10-CM | POA: Diagnosis not present

## 2014-05-12 ENCOUNTER — Ambulatory Visit (INDEPENDENT_AMBULATORY_CARE_PROVIDER_SITE_OTHER): Payer: Medicare Other | Admitting: Internal Medicine

## 2014-05-12 ENCOUNTER — Other Ambulatory Visit: Payer: Self-pay | Admitting: Family Medicine

## 2014-05-12 ENCOUNTER — Ambulatory Visit (INDEPENDENT_AMBULATORY_CARE_PROVIDER_SITE_OTHER): Payer: Medicare Other | Admitting: Pharmacist Clinician (PhC)/ Clinical Pharmacy Specialist

## 2014-05-12 DIAGNOSIS — I4891 Unspecified atrial fibrillation: Secondary | ICD-10-CM

## 2014-05-12 DIAGNOSIS — R0600 Dyspnea, unspecified: Secondary | ICD-10-CM | POA: Diagnosis not present

## 2014-05-12 LAB — PULMONARY FUNCTION TEST
DL/VA % PRED: 80 %
DL/VA: 4.32 ml/min/mmHg/L
DLCO UNC % PRED: 58 %
DLCO UNC: 18.26 ml/min/mmHg
FEF 25-75 Pre: 1.35 L/sec
FEF2575-%PRED-PRE: 78 %
FEV1-%Pred-Pre: 72 %
FEV1-Pre: 1.78 L
FEV1FVC-%Pred-Pre: 102 %
FEV6-%PRED-PRE: 75 %
FEV6-Pre: 2.34 L
FEV6FVC-%Pred-Pre: 104 %
FVC-%PRED-PRE: 72 %
FVC-PRE: 2.35 L
PRE FEV6/FVC RATIO: 100 %
Pre FEV1/FVC ratio: 76 %

## 2014-05-12 LAB — POCT INR: INR: 3.2

## 2014-05-12 NOTE — Progress Notes (Signed)
PFT done today. 

## 2014-05-15 ENCOUNTER — Ambulatory Visit (INDEPENDENT_AMBULATORY_CARE_PROVIDER_SITE_OTHER): Payer: Medicare Other | Admitting: Internal Medicine

## 2014-05-15 ENCOUNTER — Encounter: Payer: Self-pay | Admitting: Internal Medicine

## 2014-05-15 VITALS — BP 144/88 | HR 69 | Ht 69.0 in | Wt 217.0 lb

## 2014-05-15 DIAGNOSIS — R0600 Dyspnea, unspecified: Secondary | ICD-10-CM

## 2014-05-15 NOTE — Progress Notes (Signed)
Subjective:    Patient ID: Natasha [name], female    DOB: 1933/02/19, 81 y.o.   MRN: [id number] PCP Pearla Dubonnet, MD Cards: Dr Armanda Magic EP: Dr Velta Addison at [facility]  IOV 05/15/2014  Chief Complaint  Patient presents with  . Pulmonary Consult    Pt referred by Dr. Kevan Ny for dyspnea with activity. Pt c/o mild dry cough.     81 year old obese lady with the multiple medical problems and atrial fibrillation. Referred for dyspnea. She's had a long-standing history with persistent atrial fibrillation. In 2014 was evaluated by a Dr. Velta Addison at [facility] and was status post ablation 2 years ago. However a to fibrillation recurred in 2015 and she's been on  Flecainide for several months along with baseline diltiazem and bysytolic. Then subsequently in late 2015 underwent a second ablation and according to her history it was an incomplete procedure and since then she feels that she's had dyspnea on exertion which she notices for climbing flight of stairs and walking more than 500 feet on level ground. It is relieved by rest. She is concerned it may be related to her flecainide. She does have mild chronic cough with mild postnasal drainage but this is old persistent and chronic and unchanged and is unrelated to her dyspnea. There is no associated wheezing or pedal edema. She had a recent spirometry that suggestive restriction with low DLCO and a chest x-ray that suggested interstitial lung disease changes and this is an added concern and she's been referred here   CT chest 08/23/2005: Clear lung fields but with a large hiatal hernia   CT sinus 12/02/2005: No evidence of sinusitis   Spirometry with DLCO: 05/12/2014: FVC 2.35/72%, FEV1 1.8 L/72% with a ratio of 102%. Suggestive of restriction. DLCO of 18.2/58%. Overall spirometry suggests restriction with low diffusion capacity.  Chest x-ray 04/24/2014: Suggests possible ILD but she also has a hiatal hernia  ECHO 03/19/14- normal. PASP 40s  per report  Walking desaturation test 185 feet 3 laps on room air: She got dyspneic at the second lap but was able to continue and finish the third lap. Heart rate remained in the 70s   has a past medical history of GERD (gastroesophageal reflux disease); Hypertension; Seasonal allergies; Lower leg edema; Hypothyroidism; Restless leg syndrome; DJD (degenerative joint disease); Diverticular disease; Dysphagia; Hiatal hernia; and PAF (paroxysmal atrial fibrillation).   reports that she has never smoked. She has never used smokeless tobacco.  Past Surgical History  Procedure Laterality Date  . Rhinoplasty    . Abdominal hysterectomy    . Appendectomy    . Tubal ligation    . Eye surgery      cataracat  . Cardioversion      x3  . Breast surgery      left lumpectomy  . Tonsillectomy  1942  . Thyroidectomy  1973  . Bladder polyps    . Rotator cuff repair    . Esophagogastroduodenoscopy  03/03/2011    Procedure: ESOPHAGOGASTRODUODENOSCOPY (EGD);  Surgeon: Charolett Bumpers, MD;  Location: [facility];  Service: Endoscopy;  Laterality: N/A;  . Balloon dilation  03/03/2011    Procedure: BALLOON DILATION;  Surgeon: Charolett Bumpers, MD;  Location: [facility];  Service: Endoscopy;  Laterality: N/A;  . Atrial fibrillation ablation  09-06-13    2'14 -[facility]  . Cataract extraction Left   . Colonoscopy with propofol N/A 09/24/2013    Procedure: COLONOSCOPY WITH PROPOFOL;  Surgeon: Charolett Bumpers, MD;  Location: [facility];  Service: Endoscopy;  Laterality: N/A;    Allergies  Allergen Reactions  . Tramadol Nausea Only  . Codeine Nausea And Vomiting and Nausea Only  . Erythromycin Other (See Comments) and Hives    Flushing on face  . Other Other (See Comments)    ECG leads cause skin irritation.  Marland Kitchen Penicillins Hives  . Tetracyclines & Related Other (See Comments)    "Sores on legs"  . Diltiazem Rash and Other (See Comments)    Rash with long acting form  . Tetracycline Rash     Immunization History  Administered Date(s) Administered  . Influenza Split 04/18/2014  . Pneumococcal Conjugate-13 04/24/2014    Family History  Problem Relation Age of Onset  . Anesthesia problems Neg Hx   . Hypotension Neg Hx   . Malignant hyperthermia Neg Hx   . Pseudochol deficiency Neg Hx   . Transient ischemic attack Mother   . Heart failure Father   . CVA Father   . Lung cancer Father   . Hypertension Sister   . Cancer Brother      Current outpatient prescriptions:  .  acetaminophen (TYLENOL) 500 MG tablet, Take 500 mg by mouth every 6 (six) hours as needed for mild pain or headache., Disp: , Rfl:  .  butalbital-acetaminophen-caffeine (FIORICET WITH CODEINE) 50-325-40-30 MG per capsule, Take 1 capsule by mouth every 4 (four) hours as needed. For migraines, Disp: , Rfl:  .  Calcium Citrate-Vitamin D (CITRACAL + D PO), Take 1 tablet by mouth 2 (two) times daily., Disp: , Rfl:  .  cholecalciferol (VITAMIN D) 1000 UNITS tablet, Take 1,000 Units by mouth daily., Disp: , Rfl:  .  diltiazem (CARDIZEM) 30 MG tablet, Take 30 mg by mouth daily as needed (for heart rhythm). , Disp: , Rfl:  .  esomeprazole (NEXIUM) 20 MG capsule, Take 20 mg by mouth daily. , Disp: , Rfl:  .  estradiol (ESTRACE) 1 MG tablet, Take 1 mg by mouth daily., Disp: , Rfl:  .  famotidine (PEPCID AC) 10 MG chewable tablet, Chew 10 mg by mouth 2 (two) times daily as needed for heartburn. , Disp: , Rfl:  .  flecainide (TAMBOCOR) 50 MG tablet, Take 50 mg by mouth 2 (two) times daily., Disp: , Rfl:  .  furosemide (LASIX) 40 MG tablet, Take 40 mg by mouth every morning. , Disp: , Rfl:  .  hydroxypropyl methylcellulose (ISOPTO TEARS) 2.5 % ophthalmic solution, Place 1 drop into both eyes 3 (three) times daily as needed for dry eyes., Disp: , Rfl:  .  levothyroxine (SYNTHROID, LEVOTHROID) 125 MCG tablet, Take 125 mcg by mouth daily before breakfast. BRAND ONLY, Disp: , Rfl:  .  LORazepam (ATIVAN) 1 MG tablet, Take  0.5-1 mg by mouth at bedtime as needed. For sleep, Disp: , Rfl:  .  Magnesium 500 MG CAPS, Take 500 mg by mouth daily., Disp: , Rfl:  .  nebivolol (BYSTOLIC) 5 MG tablet, Take 5 mg by mouth every evening. , Disp: , Rfl:  .  potassium chloride (K-DUR) 10 MEQ tablet, Take 1 tablet (10 mEq total) by mouth daily., Disp: 30 tablet, Rfl: 3 .  warfarin (COUMADIN) 5 MG tablet, Take 2.5-5 mg by mouth daily. 5 mg Sunday, Tuesday, Thursday, Saturday. 2.5mg  Monday, Wednesday and Friday, Disp: , Rfl:      Review of Systems  Constitutional: Positive for fatigue. Negative for fever and unexpected weight change.  HENT: Negative for congestion, dental problem, ear  pain, nosebleeds, postnasal drip, rhinorrhea, sinus pressure, sneezing, sore throat and trouble swallowing.   Eyes: Negative for redness and itching.  Respiratory: Positive for cough, chest tightness, shortness of breath and wheezing.   Cardiovascular: Negative for palpitations and leg swelling.  Gastrointestinal: Negative for nausea and vomiting.  Genitourinary: Negative for dysuria.  Musculoskeletal: Positive for arthralgias. Negative for joint swelling.  Skin: Negative for rash.  Neurological: Negative for headaches.  Hematological: Does not bruise/bleed easily.  Psychiatric/Behavioral: Negative for dysphoric mood. The patient is not nervous/anxious.        Objective:   Physical Exam  Constitutional: She is oriented to person, place, and time. She appears well-developed and well-nourished. No distress.  Obese Body mass index is 32.03 kg/(m^2).   HENT:  Head: Normocephalic and atraumatic.  Right Ear: External ear normal.  Left Ear: External ear normal.  Mouth/Throat: Oropharynx is clear and moist. No oropharyngeal exudate.  Mild post nasal drip +  Eyes: Conjunctivae and EOM are normal. Pupils are equal, round, and reactive to light. Right eye exhibits no discharge. Left eye exhibits no discharge. No scleral icterus.  Neck: Normal  range of motion. Neck supple. No JVD present. No tracheal deviation present. No thyromegaly present.  Cardiovascular: Normal rate, regular rhythm, normal heart sounds and intact distal pulses.  Exam reveals no gallop and no friction rub.   No murmur heard. Pulmonary/Chest: Effort normal and breath sounds normal. No respiratory distress. She has no wheezes. She has no rales. She exhibits no tenderness.  ? Rales at base - not sure  Abdominal: Soft. Bowel sounds are normal. She exhibits no distension and no mass. There is no tenderness. There is no rebound and no guarding.  Musculoskeletal: Normal range of motion. She exhibits no edema or tenderness.  Lymphadenopathy:    She has no cervical adenopathy.  Neurological: She is alert and oriented to person, place, and time. She has normal reflexes. No cranial nerve deficit. She exhibits normal muscle tone. Coordination normal.  Skin: Skin is warm and dry. No rash noted. She is not diaphoretic. No erythema. No pallor.  Psychiatric: She has a normal mood and affect. Her behavior is normal. Judgment and thought content normal.  Vitals reviewed.   Filed Vitals:   05/15/14 1030  BP: 144/88  Pulse: 69  Height:  (1.753 m)  Weight: 217 lb (98.431 kg)  SpO2: 97%         Assessment & Plan:     ICD-9-CM ICD-10-CM   1. Dyspnea 786.09 R06.00 CT Chest High Resolution     Pulse oximetry, overnight    Differential diagnoses includes 1 possible interstitial lung disease based on chest x-ray and spirometry findings. Her hiatal hernia places her at risk for this. Other possibilities chronotropic incompetence in the presence of multiple antiarrhythmic agents #3 is fleck in a directly and #4 is diastolic dysfunction obesity and deconditioning  Plan - Get high-resolution CT scan of the chest - Get overnight oxygen study on room air - Depending on the results initiated interstitial lung disease workup if there is interstitial lung disease - But if CT  scan of the chest is negative then referred for cardiopulmonary stress testing on a bike [she thinks she might not be able to do this but I might have improved post weight her to do that at follow-up)   Dr. Kalman Shan, M.D., [facility].C.P Pulmonary and Critical Care Medicine Staff Physician [facility]  Pulmonary and Critical Care Pager: [phone], If no  answer or between  15:00h - 7:00h: call [phone]  05/15/2014 11:10 AM

## 2014-05-15 NOTE — Patient Instructions (Signed)
ICD-9-CM ICD-10-CM   1. Dyspnea 786.09 R06.00     Do HRCT chest Do ONO test on room air  Call you with results to discuss next step

## 2014-05-21 ENCOUNTER — Ambulatory Visit (INDEPENDENT_AMBULATORY_CARE_PROVIDER_SITE_OTHER)
Admission: RE | Admit: 2014-05-21 | Discharge: 2014-05-21 | Disposition: A | Discharge status: Home / Self Care | Payer: Medicare Other | Source: Ambulatory Visit | Attending: Internal Medicine | Admitting: Internal Medicine

## 2014-05-21 DIAGNOSIS — R918 Other nonspecific abnormal finding of lung field: Secondary | ICD-10-CM | POA: Diagnosis not present

## 2014-05-21 DIAGNOSIS — R0600 Dyspnea, unspecified: Secondary | ICD-10-CM | POA: Diagnosis not present

## 2014-05-21 DIAGNOSIS — R0602 Shortness of breath: Secondary | ICD-10-CM | POA: Diagnosis not present

## 2014-05-22 ENCOUNTER — Ambulatory Visit
Admission: RE | Admit: 2014-05-22 | Discharge: 2014-05-22 | Disposition: A | Discharge status: Home / Self Care | Payer: Medicare Other | Source: Ambulatory Visit

## 2014-05-22 ENCOUNTER — Other Ambulatory Visit: Payer: Self-pay

## 2014-05-22 DIAGNOSIS — Z1231 Encounter for screening mammogram for malignant neoplasm of breast: Secondary | ICD-10-CM | POA: Diagnosis not present

## 2014-05-25 ENCOUNTER — Encounter: Payer: Self-pay | Admitting: Pharmacist

## 2014-05-30 ENCOUNTER — Telehealth: Payer: Self-pay | Admitting: Internal Medicine

## 2014-05-30 DIAGNOSIS — R918 Other nonspecific abnormal finding of lung field: Secondary | ICD-10-CM

## 2014-05-30 NOTE — Telephone Encounter (Signed)
Called and spoke to pt. Pt requesting results of CT, ONO and PFT. Pt aware once MR reviews the results then we will call her. Pt verbalized understanding and denied any further questions or concerns at this time.    MR please advise.

## 2014-06-03 DIAGNOSIS — R0609 Other forms of dyspnea: Secondary | ICD-10-CM | POA: Diagnosis not present

## 2014-06-03 DIAGNOSIS — I10 Essential (primary) hypertension: Secondary | ICD-10-CM | POA: Diagnosis not present

## 2014-06-03 DIAGNOSIS — E039 Hypothyroidism, unspecified: Secondary | ICD-10-CM | POA: Diagnosis not present

## 2014-06-03 DIAGNOSIS — R5382 Chronic fatigue, unspecified: Secondary | ICD-10-CM | POA: Diagnosis not present

## 2014-06-03 DIAGNOSIS — I4891 Unspecified atrial fibrillation: Secondary | ICD-10-CM | POA: Diagnosis not present

## 2014-06-03 DIAGNOSIS — G43009 Migraine without aura, not intractable, without status migrainosus: Secondary | ICD-10-CM | POA: Diagnosis not present

## 2014-06-05 NOTE — Telephone Encounter (Signed)
Please set patient up with Dr. [name] for right heart cath to evaluate pulmonary HTN.  This was mild on echo but patient saw Dr. Marchelle Gearing for SOB and concern whether she has significant pulmonary HTN.

## 2014-06-05 NOTE — Telephone Encounter (Signed)
Called and spoke to pt. Informed pt we are waiting on results to be reviewed. Pt verbalized understanding. Pt stated she is very eager to find out results.   MR please advise.

## 2014-06-05 NOTE — Telephone Encounter (Signed)
Triag/Natasha [name] (cc to Dr Armanda Magic cardiology)   Called and spoke to patient   1.REgarding dyspnea:  CT chest 05/22/14:  No ILD . Possible elevation of PASP in dec 2015 echo -  She does not think she can do bike CPST. Wonder if she needs right heart cath ?  - sending message to Dr Armanda Magic to opine. DDx for dyspnea - PAH, diast dysfn deconditioning . I think Rt Heart cath prob warranted   2 . CT also shows RUL  GGO 1cm - possibilyt of early lung cancer - explained - repeat CT chest Super D CT in 3 months   3. Hiatal hernia also seen - this is known to her  4. ONO test - once I find it will call back    5. Pleas send my office note, ct report, echo report and this phone call note to Dr Val Riles at [facility]  6. ROV 3 months after CT chgest   Dr. Kalman Shan, M.D., [facility].C.P Pulmonary and Critical Care Medicine Staff Physician [facility] [place] Pulmonary and Critical Care Pager: [phone], If no answer or between  15:00h - 7:00h: call [phone]  06/05/2014 5:24 PM

## 2014-06-05 NOTE — Telephone Encounter (Signed)
MR please advise 

## 2014-06-05 NOTE — Telephone Encounter (Signed)
[phone], pt cb again to get results

## 2014-06-06 NOTE — Telephone Encounter (Signed)
Please let patient know that yopui have faxed to the [facility] doc; she is kind of anxious about it

## 2014-06-06 NOTE — Telephone Encounter (Signed)
These have been faxed to Dr. Macon Large at [facility].  Will forward back to MR to make him aware.

## 2014-06-06 NOTE — Telephone Encounter (Signed)
Called and spoke to pt. Pt aware we have faxed her recs to [facility]. Order placed for CT and 54month recall. Pt verbalized understanding and denied any further questions or concerns at this time.

## 2014-06-09 ENCOUNTER — Ambulatory Visit: Payer: Medicare Other | Admitting: Pharmacist Clinician (PhC)/ Clinical Pharmacy Specialist

## 2014-06-09 DIAGNOSIS — Z006 Encounter for examination for normal comparison and control in clinical research program: Secondary | ICD-10-CM | POA: Diagnosis not present

## 2014-06-09 DIAGNOSIS — G4730 Sleep apnea, unspecified: Secondary | ICD-10-CM | POA: Diagnosis not present

## 2014-06-09 DIAGNOSIS — I480 Paroxysmal atrial fibrillation: Secondary | ICD-10-CM | POA: Diagnosis not present

## 2014-06-09 DIAGNOSIS — Z79899 Other long term (current) drug therapy: Secondary | ICD-10-CM | POA: Diagnosis not present

## 2014-06-11 ENCOUNTER — Telehealth: Payer: Self-pay | Admitting: Internal Medicine

## 2014-06-11 NOTE — Telephone Encounter (Signed)
Patient refuses to schedule appointment for R heart cath until Dr. Mayford Knife reviews most recent medical records from Dr. Macon Large.  Most recent records not available in "Care Everywhere" as patient st she just had an appointment on Monday.  Message sent to Medical Records to obtain information.

## 2014-06-11 NOTE — Telephone Encounter (Signed)
Called and spoke to pt. Informed pt of the results and recs pre MR. Pt verbalized understanding and denied any further questions or concerns at this time.

## 2014-06-11 NOTE — Telephone Encounter (Signed)
Please let her know that her pulmonologist want the right heart cath done

## 2014-06-11 NOTE — Telephone Encounter (Signed)
ono 2/2/216 revwed - total time </= 88% st 20sec. Basically normal. No need for o2 at night. Let her know

## 2014-06-12 ENCOUNTER — Encounter: Payer: Self-pay | Admitting: Cardiology

## 2014-06-12 NOTE — Telephone Encounter (Signed)
Pt and husband are  aware that Dr. Mayford Knife states that pt's pulmonologist wants the right cath done. Pt's husband verbalized understanding.

## 2014-06-12 NOTE — Telephone Encounter (Signed)
I spoke with the patient. She called back stating her husband was unclear on the conversation he had with the nurse earlier today. I advised the patient that a right heart cath is needing to be scheduled per the request of Dr. Marchelle Gearing. The patient was questioning of Dr. Mayford Knife was "washing her hands" of everything or what was going on. I advised her this is not the case, but her care at this point in regards to there pulmonary HTN is being driven by Dr. Marchelle Gearing. We would be the ones to do her right heart cath. She reports that she saw Dr. Macon Large on Monday and he had her wear a 48 hour heart monitor- sounds like he may be assessing for bradycardia/ need for pacing. She mailed the monitor back today. She is wanting to wait for the results of heart monitor prior to scheduling a cath. I have asked that she call us back after Dr. Macon Large calls her about her results and let us know what his recommendations are. We can go from there with scheduling her cath. She is agreeable.

## 2014-06-12 NOTE — Telephone Encounter (Signed)
Please let her know that the heart monitor and her afib have nothing to do with her pulmonary HTN

## 2014-06-13 ENCOUNTER — Ambulatory Visit (INDEPENDENT_AMBULATORY_CARE_PROVIDER_SITE_OTHER): Payer: Medicare Other | Admitting: Pharmacist Clinician (PhC)/ Clinical Pharmacy Specialist

## 2014-06-13 DIAGNOSIS — I4891 Unspecified atrial fibrillation: Secondary | ICD-10-CM | POA: Diagnosis not present

## 2014-06-13 LAB — POCT INR: INR: 2.2

## 2014-06-17 ENCOUNTER — Ambulatory Visit: Payer: Medicare Other | Admitting: Physician Assistant

## 2014-06-17 NOTE — Telephone Encounter (Signed)
Patient agrees to R heart catheterization.  PA OV scheduled on 3/16 for pre-cath check-up and lab work. R heart catheterization scheduled for 3/25 with Dr. [name].

## 2014-06-19 DIAGNOSIS — I480 Paroxysmal atrial fibrillation: Secondary | ICD-10-CM | POA: Diagnosis not present

## 2014-07-02 ENCOUNTER — Encounter: Payer: Self-pay | Admitting: Physician Assistant

## 2014-07-02 ENCOUNTER — Ambulatory Visit (INDEPENDENT_AMBULATORY_CARE_PROVIDER_SITE_OTHER): Payer: Medicare Other | Admitting: Pharmacist Clinician (PhC)/ Clinical Pharmacy Specialist

## 2014-07-02 ENCOUNTER — Encounter: Payer: Self-pay | Admitting: *Deleted

## 2014-07-02 ENCOUNTER — Ambulatory Visit (INDEPENDENT_AMBULATORY_CARE_PROVIDER_SITE_OTHER): Payer: Medicare Other | Admitting: Physician Assistant

## 2014-07-02 VITALS — BP 120/80 | HR 74 | Ht 69.0 in | Wt 215.0 lb

## 2014-07-02 DIAGNOSIS — I480 Paroxysmal atrial fibrillation: Secondary | ICD-10-CM

## 2014-07-02 DIAGNOSIS — I10 Essential (primary) hypertension: Secondary | ICD-10-CM

## 2014-07-02 DIAGNOSIS — Z9229 Personal history of other drug therapy: Secondary | ICD-10-CM | POA: Diagnosis not present

## 2014-07-02 DIAGNOSIS — I4891 Unspecified atrial fibrillation: Secondary | ICD-10-CM | POA: Diagnosis not present

## 2014-07-02 DIAGNOSIS — I270 Primary pulmonary hypertension: Secondary | ICD-10-CM

## 2014-07-02 DIAGNOSIS — I272 Pulmonary hypertension, unspecified: Secondary | ICD-10-CM | POA: Insufficient documentation

## 2014-07-02 LAB — BASIC METABOLIC PANEL
BUN: 12 mg/dL (ref 6–23)
CO2: 33 mEq/L — ABNORMAL HIGH (ref 19–32)
Calcium: 9.1 mg/dL (ref 8.4–10.5)
Chloride: 102 mEq/L (ref 96–112)
Creatinine, Ser: 0.79 mg/dL (ref 0.40–1.20)
GFR: 74.42 mL/min (ref >60.00)
Glucose, Bld: 93 mg/dL (ref 70–99)
POTASSIUM: 4.2 meq/L (ref 3.5–5.1)
SODIUM: 136 meq/L (ref 135–145)

## 2014-07-02 LAB — CBC
HCT: 40.3 % (ref 36.0–46.0)
Hemoglobin: 14.1 g/dL (ref 12.0–15.0)
MCHC: 35.1 g/dL (ref 30.0–36.0)
MCV: 96.5 fl (ref 78.0–100.0)
Platelets: 208 10*3/uL (ref 150.0–400.0)
RBC: 4.17 Mil/uL (ref 3.87–5.11)
RDW: 13.2 % (ref 11.5–15.5)
WBC: 5 10*3/uL (ref 4.0–10.5)

## 2014-07-02 LAB — PROTIME-INR
INR: 2.1 ratio — ABNORMAL HIGH (ref 0.8–1.0)
Prothrombin Time: 23.3 s — ABNORMAL HIGH (ref 9.6–13.1)

## 2014-07-02 LAB — POCT INR: INR: 2.1

## 2014-07-02 NOTE — H&P (Signed)
History and Physical   Date:  07/02/2014   ID:  Natasha [name], DOB 05/11/1932, MRN [id number]  PCP:  Pearla Dubonnet, MD  Cardiologist:  Dr. Armanda Magic   Electrophysiologist:  Dr. Macon Large at [facility] Complaint  Patient presents with  . Pulmonary HTN    needs to be set up for RHC     History of Present Illness: Natasha [name] is a 81 y.o. female with a hx of HTN, PAF, Hashimoto's Thyroiditis.  She has undergone PVI ablation for AFib as well as focal micor reentrant ATach catheter ablation with Dr. Virgia Land at [facility].  She is on Flecainide to control breakthrough ATach.  She saw Dr. Macon Large last month.  Due to her DOE, there was concern for chronotropic incompetence and her Flecainide was DCd.  Holter is pending.  Sleep testing has been recommended to r/o OSA.  Recently evaluated by Dr. Marchelle Gearing for her dyspnea.  Echo recently demonstrated mild Pulmo HTN with PASP in 40s.  CT was neg for ILD.  There was restriction with low DLCO on PFTs.  Patient unable to do CPST.  Recommendation is to proceed with RHC to better evaluate.  She returns for pre-cath workup.    Since stopping her flecainide, she denies any increase in her palpitations. She remained short of breath with exertion. She is NYHA IIb-3. She denies orthopnea, PND. She has mild pedal edema without change. She has occasional tightness in her chest when she feels short of breath. She has significant chest discomfort when she goes into atrial fibrillation with radiation up to her jaw and down her arms. This is typical for her over the years. She denies syncope.   Studies/Reports Reviewed Today:  Echo 02/2014 - Mild concentric hypertrophy. Systolic function was vigorous. EF 65-70%. Wall motion was normal. Left ventricular diastolic function parameters were normal. - Aortic valve: Trileaflet; normal thickness leaflets. There was no regurgitation. - Aortic root: The aortic root was normal in size. - Mitral valve:  Structurally normal valve. There was mild regurgitation. - Left atrium: The atrium was normal in size. - Right ventricle: Systolic function was normal. - Right atrium: The atrium was normal in size. - Tricuspid valve: There was mild regurgitation. - Pulmonic valve: There was mild regurgitation. - Mildly increased. PA peak pressure: 40 mm Hg (S). - Inferior vena cava: The vessel was normal in size. - Pericardium, extracardiac: There was no pericardial effusion.  Myoview 12/2013 Normal stress nuclear study.  LV Ejection Fraction: 76%.     Past Medical History  Diagnosis Date  . GERD (gastroesophageal reflux disease)   . Hypertension   . Seasonal allergies   . Lower leg edema   . Hypothyroidism   . Restless leg syndrome   . DJD (degenerative joint disease)   . Diverticular disease   . Dysphagia   . Hiatal hernia   . PAF (paroxysmal atrial fibrillation)     s/p ablation done and also pill in the pocket flecainide    Past Surgical History  Procedure Laterality Date  . Rhinoplasty    . Abdominal hysterectomy    . Appendectomy    . Tubal ligation    . Eye surgery      cataracat  . Cardioversion      x3  . Breast surgery      left lumpectomy  . Tonsillectomy  1942  . Thyroidectomy  1973  . Bladder polyps    . Rotator cuff repair    .  Esophagogastroduodenoscopy  03/03/2011    Procedure: ESOPHAGOGASTRODUODENOSCOPY (EGD);  Surgeon: Charolett Bumpers, MD;  Location: [facility];  Service: Endoscopy;  Laterality: N/A;  . Balloon dilation  03/03/2011    Procedure: BALLOON DILATION;  Surgeon: Charolett Bumpers, MD;  Location: [facility];  Service: Endoscopy;  Laterality: N/A;  . Atrial fibrillation ablation  09-06-13    2'14 -[facility]  . Cataract extraction Left   . Colonoscopy with propofol N/A 09/24/2013    Procedure: COLONOSCOPY WITH PROPOFOL;  Surgeon: Charolett Bumpers, MD;  Location: [facility];  Service: Endoscopy;  Laterality: N/A;     Current Outpatient  Prescriptions  Medication Sig Dispense Refill  . acetaminophen (TYLENOL) 500 MG tablet Take 500 mg by mouth every 6 (six) hours as needed for mild pain or headache.    . butalbital-acetaminophen-caffeine (FIORICET WITH CODEINE) 50-325-40-30 MG per capsule Take 1 capsule by mouth every 4 (four) hours as needed. For migraines    . Calcium Citrate-Vitamin D (CITRACAL + D PO) Take 1 tablet by mouth 2 (two) times daily.    . cholecalciferol (VITAMIN D) 1000 UNITS tablet Take 1,000 Units by mouth daily.    Marland Kitchen diltiazem (CARDIZEM) 30 MG tablet Take 30 mg by mouth daily as needed (for heart rhythm).     . esomeprazole (NEXIUM) 20 MG capsule Take 20 mg by mouth every other day.     . estradiol (ESTRACE) 1 MG tablet Take 1 mg by mouth daily.    . famotidine (PEPCID AC) 10 MG chewable tablet Chew 10 mg by mouth 2 (two) times daily as needed for heartburn.     . furosemide (LASIX) 40 MG tablet Take 40 mg by mouth every morning.     . hydroxypropyl methylcellulose (ISOPTO TEARS) 2.5 % ophthalmic solution Place 1 drop into both eyes 3 (three) times daily as needed for dry eyes.    Marland Kitchen levothyroxine (SYNTHROID, LEVOTHROID) 125 MCG tablet Take 125 mcg by mouth daily before breakfast. BRAND ONLY    . LORazepam (ATIVAN) 1 MG tablet Take 0.5-1 mg by mouth at bedtime as needed. For sleep    . Magnesium 500 MG CAPS Take 500 mg by mouth daily.    . nebivolol (BYSTOLIC) 5 MG tablet Take 5 mg by mouth every evening.     . potassium chloride (K-DUR) 10 MEQ tablet Take 1 tablet (10 mEq total) by mouth daily. 30 tablet 3  . warfarin (COUMADIN) 5 MG tablet Take 2.5-5 mg by mouth daily. 5 mg Sunday, Tuesday, Thursday, Saturday. 2.5mg  Monday, Wednesday and Friday     No current facility-administered medications for this visit.    Allergies:   Tramadol; Codeine; Erythromycin; Other; Penicillins; Tetracyclines & related; Diltiazem; and Tetracycline    Social History:  The patient  reports that she has never smoked. She has  never used smokeless tobacco. She reports that she drinks alcohol. She reports that she does not use illicit drugs.   Family History:  The patient's family history includes CVA in her father; Cancer in her brother; Heart failure in her father; Hypertension in her sister; Lung cancer in her father; Sudden death in her brother; Transient ischemic attack in her mother. There is no history of Anesthesia problems, Hypotension, Malignant hyperthermia, Pseudochol deficiency, or Heart attack.    ROS:   Please see the history of present illness.   Review of Systems  Constitution: Positive for diaphoresis and malaise/fatigue.  Cardiovascular: Positive for chest pain, dyspnea on exertion and palpitations.  Respiratory:  Positive for cough.   Musculoskeletal: Positive for back pain, joint pain and myalgias.  Gastrointestinal: Positive for abdominal pain.  All other systems reviewed and are negative.     PHYSICAL EXAM: VS:  BP 120/80 mmHg  Pulse 74  Ht  (1.753 m)  Wt 215 lb (97.523 kg)  BMI 31.74 kg/m2    Wt Readings from Last 3 Encounters:  07/02/14 215 lb (97.523 kg)  05/15/14 217 lb (98.431 kg)  03/07/14 213 lb (96.616 kg)     GEN: Well nourished, well developed, in no acute distress HEENT: normal Neck: no JVD, no masses Cardiac:  Normal S1/S2, RRR; no murmur ,  no rubs or gallops, no edema  Respiratory:  clear to auscultation bilaterally, no wheezing, rhonchi or rales. GI: soft, nontender, nondistended, + BS MS: no deformity or atrophy Skin: warm and dry  Neuro:  CNs II-XII intact, Strength and sensation are intact Psych: Normal affect   EKG:  EKG is ordered today.  It demonstrates:   NSR, HR 74, PR 218 ms, low voltage, no changes   Recent Labs: 12/12/2013: TSH 0.16* 01/10/2014: ALT 17; Magnesium 1.9 03/07/2014: BUN 14; Creatinine 0.9; Hemoglobin 14.3; Platelets 238.0; Potassium 4.2; Pro B Natriuretic peptide (BNP) 70.0; Sodium 136    Lipid Panel No results found for:  CHOL, TRIG, HDL, CHOLHDL, VLDL, LDLCALC, LDLDIRECT    ASSESSMENT AND PLAN:  Pulmonary hypertension Recommendation has been to proceed with right heart catheterization. Risks and benefits of that explained to the patient today. This is already been arranged with Dr. [name].  AF (paroxysmal atrial fibrillation) Maintaining NSR. Continue follow-up with her cardiologist at [facility].  Essential hypertension Controlled.  History of anticoagulant therapy  She has no history of stroke. She will hold her Coumadin 5 days prior to her right-sided heart catheterization.   Current medicines are reviewed at length with the patient today.  The patient does not have concerns regarding medicines.  The following changes have been made:  As above.   Labs/ tests ordered today include:   Orders Placed This Encounter  Procedures  . Basic Metabolic Panel (BMET)  . CBC  . INR/PT  . EKG 12-Lead    Disposition:   FU with Dr. Armanda Magic after RHC.   Signed, Brynda Rim, MHS 07/02/2014 11:42 AM    [facility] [address] [place], [place], [place]  [address] Phone: [phone]; Fax: [phone]

## 2014-07-02 NOTE — Progress Notes (Signed)
Cardiology Office Note   Date:  07/02/2014   ID:  [name], DOB Apr 19, 1932, MRN [id number]  PCP:  Pearla Dubonnet, MD  Cardiologist:  Dr. Armanda Magic   Electrophysiologist:  Dr. Macon Large at [facility] Complaint  Patient presents with  . Pulmonary HTN    needs to be set up for RHC     History of Present Illness: Natasha [name] is a 81 y.o. female with a hx of HTN, PAF, Hashimoto's Thyroiditis.  She has undergone PVI ablation for AFib as well as focal micor reentrant ATach catheter ablation with Dr. Virgia Land at [facility].  She is on Flecainide to control breakthrough ATach.  She saw Dr. Macon Large last month.  Due to her DOE, there was concern for chronotropic incompetence and her Flecainide was DCd.  Holter is pending.  Sleep testing has been recommended to r/o OSA.  Recently evaluated by Dr. Marchelle Gearing for her dyspnea.  Echo recently demonstrated mild Pulmo HTN with PASP in 40s.  CT was neg for ILD.  There was restriction with low DLCO on PFTs.  Patient unable to do CPST.  Recommendation is to proceed with RHC to better evaluate.  She returns for pre-cath workup.    Since stopping her flecainide, she denies any increase in her palpitations. She remained short of breath with exertion. She is NYHA IIb-3. She denies orthopnea, PND. She has mild pedal edema without change. She has occasional tightness in her chest when she feels short of breath. She has significant chest discomfort when she goes into atrial fibrillation with radiation up to her jaw and down her arms. This is typical for her over the years. She denies syncope.   Studies/Reports Reviewed Today:  Echo 02/2014 - Mild concentric hypertrophy. Systolic function was vigorous. EF 65-70%. Wall motion was normal. Left ventricular diastolic function parameters were normal. - Aortic valve: Trileaflet; normal thickness leaflets. There was no regurgitation. - Aortic root: The aortic root was normal in size. - Mitral valve:  Structurally normal valve. There was mild regurgitation. - Left atrium: The atrium was normal in size. - Right ventricle: Systolic function was normal. - Right atrium: The atrium was normal in size. - Tricuspid valve: There was mild regurgitation. - Pulmonic valve: There was mild regurgitation. - Mildly increased. PA peak pressure: 40 mm Hg (S). - Inferior vena cava: The vessel was normal in size. - Pericardium, extracardiac: There was no pericardial effusion.  Myoview 12/2013 Normal stress nuclear study.  LV Ejection Fraction: 76%.     Past Medical History  Diagnosis Date  . GERD (gastroesophageal reflux disease)   . Hypertension   . Seasonal allergies   . Lower leg edema   . Hypothyroidism   . Restless leg syndrome   . DJD (degenerative joint disease)   . Diverticular disease   . Dysphagia   . Hiatal hernia   . PAF (paroxysmal atrial fibrillation)     s/p ablation done and also pill in the pocket flecainide    Past Surgical History  Procedure Laterality Date  . Rhinoplasty    . Abdominal hysterectomy    . Appendectomy    . Tubal ligation    . Eye surgery      cataracat  . Cardioversion      x3  . Breast surgery      left lumpectomy  . Tonsillectomy  1942  . Thyroidectomy  1973  . Bladder polyps    . Rotator cuff repair    .  Esophagogastroduodenoscopy  03/03/2011    Procedure: ESOPHAGOGASTRODUODENOSCOPY (EGD);  Surgeon: Charolett Bumpers, MD;  Location: [facility];  Service: Endoscopy;  Laterality: N/A;  . Balloon dilation  03/03/2011    Procedure: BALLOON DILATION;  Surgeon: Charolett Bumpers, MD;  Location: [facility];  Service: Endoscopy;  Laterality: N/A;  . Atrial fibrillation ablation  09-06-13    2'14 -[facility]  . Cataract extraction Left   . Colonoscopy with propofol N/A 09/24/2013    Procedure: COLONOSCOPY WITH PROPOFOL;  Surgeon: Charolett Bumpers, MD;  Location: [facility];  Service: Endoscopy;  Laterality: N/A;     Current Outpatient  Prescriptions  Medication Sig Dispense Refill  . acetaminophen (TYLENOL) 500 MG tablet Take 500 mg by mouth every 6 (six) hours as needed for mild pain or headache.    . butalbital-acetaminophen-caffeine (FIORICET WITH CODEINE) 50-325-40-30 MG per capsule Take 1 capsule by mouth every 4 (four) hours as needed. For migraines    . Calcium Citrate-Vitamin D (CITRACAL + D PO) Take 1 tablet by mouth 2 (two) times daily.    . cholecalciferol (VITAMIN D) 1000 UNITS tablet Take 1,000 Units by mouth daily.    Marland Kitchen diltiazem (CARDIZEM) 30 MG tablet Take 30 mg by mouth daily as needed (for heart rhythm).     . esomeprazole (NEXIUM) 20 MG capsule Take 20 mg by mouth every other day.     . estradiol (ESTRACE) 1 MG tablet Take 1 mg by mouth daily.    . famotidine (PEPCID AC) 10 MG chewable tablet Chew 10 mg by mouth 2 (two) times daily as needed for heartburn.     . furosemide (LASIX) 40 MG tablet Take 40 mg by mouth every morning.     . hydroxypropyl methylcellulose (ISOPTO TEARS) 2.5 % ophthalmic solution Place 1 drop into both eyes 3 (three) times daily as needed for dry eyes.    Marland Kitchen levothyroxine (SYNTHROID, LEVOTHROID) 125 MCG tablet Take 125 mcg by mouth daily before breakfast. BRAND ONLY    . LORazepam (ATIVAN) 1 MG tablet Take 0.5-1 mg by mouth at bedtime as needed. For sleep    . Magnesium 500 MG CAPS Take 500 mg by mouth daily.    . nebivolol (BYSTOLIC) 5 MG tablet Take 5 mg by mouth every evening.     . potassium chloride (K-DUR) 10 MEQ tablet Take 1 tablet (10 mEq total) by mouth daily. 30 tablet 3  . warfarin (COUMADIN) 5 MG tablet Take 2.5-5 mg by mouth daily. 5 mg Sunday, Tuesday, Thursday, Saturday. 2.5mg  Monday, Wednesday and Friday     No current facility-administered medications for this visit.    Allergies:   Tramadol; Codeine; Erythromycin; Other; Penicillins; Tetracyclines & related; Diltiazem; and Tetracycline    Social History:  The patient  reports that she has never smoked. She has  never used smokeless tobacco. She reports that she drinks alcohol. She reports that she does not use illicit drugs.   Family History:  The patient's family history includes CVA in her father; Cancer in her brother; Heart failure in her father; Hypertension in her sister; Lung cancer in her father; Sudden death in her brother; Transient ischemic attack in her mother. There is no history of Anesthesia problems, Hypotension, Malignant hyperthermia, Pseudochol deficiency, or Heart attack.    ROS:   Please see the history of present illness.   Review of Systems  Constitution: Positive for diaphoresis and malaise/fatigue.  Cardiovascular: Positive for chest pain, dyspnea on exertion and palpitations.  Respiratory:  Positive for cough.   Musculoskeletal: Positive for back pain, joint pain and myalgias.  Gastrointestinal: Positive for abdominal pain.  All other systems reviewed and are negative.     PHYSICAL EXAM: VS:  BP 120/80 mmHg  Pulse 74  Ht  (1.753 m)  Wt 215 lb (97.523 kg)  BMI 31.74 kg/m2    Wt Readings from Last 3 Encounters:  07/02/14 215 lb (97.523 kg)  05/15/14 217 lb (98.431 kg)  03/07/14 213 lb (96.616 kg)     GEN: Well nourished, well developed, in no acute distress HEENT: normal Neck: no JVD, no masses Cardiac:  Normal S1/S2, RRR; no murmur ,  no rubs or gallops, no edema  Respiratory:  clear to auscultation bilaterally, no wheezing, rhonchi or rales. GI: soft, nontender, nondistended, + BS MS: no deformity or atrophy Skin: warm and dry  Neuro:  CNs II-XII intact, Strength and sensation are intact Psych: Normal affect   EKG:  EKG is ordered today.  It demonstrates:   NSR, HR 74, PR 218 ms, low voltage, no changes   Recent Labs: 12/12/2013: TSH 0.16* 01/10/2014: ALT 17; Magnesium 1.9 03/07/2014: BUN 14; Creatinine 0.9; Hemoglobin 14.3; Platelets 238.0; Potassium 4.2; Pro B Natriuretic peptide (BNP) 70.0; Sodium 136    Lipid Panel No results found for:  CHOL, TRIG, HDL, CHOLHDL, VLDL, LDLCALC, LDLDIRECT    ASSESSMENT AND PLAN:  Pulmonary hypertension Recommendation has been to proceed with right heart catheterization. Risks and benefits of that explained to the patient today. This is already been arranged with Dr. [name].  AF (paroxysmal atrial fibrillation) Maintaining NSR. Continue follow-up with her cardiologist at [facility].  Essential hypertension Controlled.  History of anticoagulant therapy  She has no history of stroke. She will hold her Coumadin 5 days prior to her right-sided heart catheterization.   Current medicines are reviewed at length with the patient today.  The patient does not have concerns regarding medicines.  The following changes have been made:  As above.   Labs/ tests ordered today include:   Orders Placed This Encounter  Procedures  . Basic Metabolic Panel (BMET)  . CBC  . INR/PT  . EKG 12-Lead    Disposition:   FU with Dr. Armanda Magic after RHC.   Signed, Brynda Rim, MHS 07/02/2014 11:42 AM    [facility] [address] [place], [place], [place]  [address] Phone: [phone]; Fax: [phone]

## 2014-07-02 NOTE — Telephone Encounter (Signed)
See office note today Tereso Newcomer, PA-C   07/02/2014 1:48 PM

## 2014-07-02 NOTE — Patient Instructions (Addendum)
Your physician recommends that you continue on your current medications as directed. Please refer to the Current Medication list given to you today.  LABS TODAY BMET CBCB PT/INR FOR PRE CATH LABS   Your physician has requested that you have a cardiac catheterization. Cardiac catheterization is used to diagnose and/or treat various heart conditions. Doctors may recommend this procedure for a number of different reasons. The most common reason is to evaluate chest pain. Chest pain can be a symptom of coronary artery disease (CAD), and cardiac catheterization can show whether plaque is narrowing or blocking your heart's arteries. This procedure is also used to evaluate the valves, as well as measure the blood flow and oxygen levels in different parts of your heart. For further information please visit [email or web address] Please follow instruction sheet, as given.LETTER CREATED WITH INSTRUCTIONS

## 2014-07-03 ENCOUNTER — Other Ambulatory Visit: Payer: Self-pay | Admitting: Cardiology

## 2014-07-03 ENCOUNTER — Other Ambulatory Visit: Payer: Self-pay | Admitting: *Deleted

## 2014-07-03 MED ORDER — POTASSIUM CHLORIDE ER 10 MEQ PO TBCR: 10.0000 meq | EXTENDED_RELEASE_TABLET | Freq: Every day | ORAL | Status: DC

## 2014-07-07 ENCOUNTER — Encounter: Payer: Self-pay | Admitting: Internal Medicine

## 2014-07-07 ENCOUNTER — Telehealth: Payer: Self-pay | Admitting: *Deleted

## 2014-07-07 ENCOUNTER — Ambulatory Visit: Payer: Medicare Other | Admitting: Pharmacist Clinician (PhC)/ Clinical Pharmacy Specialist

## 2014-07-07 NOTE — Telephone Encounter (Signed)
s/w pt on pt's husbands cell phone, which pt then states they were in [place] on vacation. Pt has been notified of her lab results and confirmed her cath 3/25 with Dr. [name].

## 2014-07-11 ENCOUNTER — Encounter (HOSPITAL_COMMUNITY): Payer: Self-pay | Admitting: Cardiology

## 2014-07-11 ENCOUNTER — Ambulatory Visit (HOSPITAL_COMMUNITY)
Admission: RE | Admit: 2014-07-11 | Discharge: 2014-07-11 | Disposition: A | Discharge status: Home / Self Care | Payer: Medicare Other | Source: Ambulatory Visit | Attending: Cardiology | Admitting: Cardiology

## 2014-07-11 ENCOUNTER — Encounter (HOSPITAL_COMMUNITY)
Admission: RE | Disposition: A | Discharge status: Home / Self Care | Payer: Self-pay | Source: Ambulatory Visit | Attending: Cardiology

## 2014-07-11 DIAGNOSIS — Z7901 Long term (current) use of anticoagulants: Secondary | ICD-10-CM | POA: Diagnosis not present

## 2014-07-11 DIAGNOSIS — Z9049 Acquired absence of other specified parts of digestive tract: Secondary | ICD-10-CM | POA: Insufficient documentation

## 2014-07-11 DIAGNOSIS — Z79899 Other long term (current) drug therapy: Secondary | ICD-10-CM | POA: Insufficient documentation

## 2014-07-11 DIAGNOSIS — I10 Essential (primary) hypertension: Secondary | ICD-10-CM | POA: Insufficient documentation

## 2014-07-11 DIAGNOSIS — M1990 Unspecified osteoarthritis, unspecified site: Secondary | ICD-10-CM | POA: Diagnosis not present

## 2014-07-11 DIAGNOSIS — K5790 Diverticulosis of intestine, part unspecified, without perforation or abscess without bleeding: Secondary | ICD-10-CM | POA: Insufficient documentation

## 2014-07-11 DIAGNOSIS — I081 Rheumatic disorders of both mitral and tricuspid valves: Secondary | ICD-10-CM | POA: Diagnosis not present

## 2014-07-11 DIAGNOSIS — Z9089 Acquired absence of other organs: Secondary | ICD-10-CM | POA: Diagnosis not present

## 2014-07-11 DIAGNOSIS — E063 Autoimmune thyroiditis: Secondary | ICD-10-CM | POA: Insufficient documentation

## 2014-07-11 DIAGNOSIS — G2581 Restless legs syndrome: Secondary | ICD-10-CM | POA: Insufficient documentation

## 2014-07-11 DIAGNOSIS — I272 Other secondary pulmonary hypertension: Secondary | ICD-10-CM | POA: Diagnosis not present

## 2014-07-11 DIAGNOSIS — K219 Gastro-esophageal reflux disease without esophagitis: Secondary | ICD-10-CM | POA: Insufficient documentation

## 2014-07-11 DIAGNOSIS — J302 Other seasonal allergic rhinitis: Secondary | ICD-10-CM | POA: Insufficient documentation

## 2014-07-11 DIAGNOSIS — I270 Primary pulmonary hypertension: Secondary | ICD-10-CM | POA: Diagnosis not present

## 2014-07-11 DIAGNOSIS — Z90710 Acquired absence of both cervix and uterus: Secondary | ICD-10-CM | POA: Diagnosis not present

## 2014-07-11 DIAGNOSIS — I480 Paroxysmal atrial fibrillation: Secondary | ICD-10-CM | POA: Insufficient documentation

## 2014-07-11 DIAGNOSIS — K449 Diaphragmatic hernia without obstruction or gangrene: Secondary | ICD-10-CM | POA: Diagnosis not present

## 2014-07-11 HISTORY — PX: RIGHT HEART CATHETERIZATION: SHX5447

## 2014-07-11 LAB — PROTIME-INR
INR: 1.03 (ref 0.00–1.49)
PROTHROMBIN TIME: 13.6 s (ref 11.6–15.2)

## 2014-07-11 SURGERY — RIGHT HEART CATH

## 2014-07-11 MED ORDER — SODIUM CHLORIDE 0.9 % IV SOLN: 250.0000 mL | INTRAVENOUS | Status: DC | PRN

## 2014-07-11 MED ORDER — ASPIRIN 81 MG PO CHEW
81.0000 mg | CHEWABLE_TABLET | ORAL | Status: AC
  Administered 2014-07-11: 81 mg via ORAL

## 2014-07-11 MED ORDER — SODIUM CHLORIDE 0.9 % IV SOLN
INTRAVENOUS | Status: DC
  Administered 2014-07-11: 11:00:00 via INTRAVENOUS

## 2014-07-11 MED ORDER — ACETAMINOPHEN 325 MG PO TABS: 650.0000 mg | ORAL_TABLET | ORAL | Status: DC | PRN

## 2014-07-11 MED ORDER — SODIUM CHLORIDE 0.9 % IJ SOLN: 3.0000 mL | Freq: Two times a day (BID) | INTRAMUSCULAR | Status: DC

## 2014-07-11 MED ORDER — SODIUM CHLORIDE 0.9 % IJ SOLN: 3.0000 mL | INTRAMUSCULAR | Status: DC | PRN

## 2014-07-11 MED ORDER — FENTANYL CITRATE 0.05 MG/ML IJ SOLN
INTRAMUSCULAR | Status: AC
  Filled 2014-07-11: qty 2

## 2014-07-11 MED ORDER — ASPIRIN 81 MG PO CHEW
CHEWABLE_TABLET | ORAL | Status: AC
  Filled 2014-07-11: qty 1

## 2014-07-11 MED ORDER — MIDAZOLAM HCL 2 MG/2ML IJ SOLN
INTRAMUSCULAR | Status: AC
Start: 2014-07-11 — End: 2014-07-11
  Filled 2014-07-11: qty 2

## 2014-07-11 MED ORDER — ONDANSETRON HCL 4 MG/2ML IJ SOLN: 4.0000 mg | Freq: Four times a day (QID) | INTRAMUSCULAR | Status: DC | PRN

## 2014-07-11 MED ORDER — LIDOCAINE HCL (PF) 1 % IJ SOLN
INTRAMUSCULAR | Status: AC
  Filled 2014-07-11: qty 30

## 2014-07-11 MED ORDER — HEPARIN (PORCINE) IN NACL 2-0.9 UNIT/ML-% IJ SOLN
INTRAMUSCULAR | Status: AC
Start: 2014-07-11 — End: 2014-07-11
  Filled 2014-07-11: qty 500

## 2014-07-11 NOTE — Discharge Instructions (Signed)
Venogram, Care After Refer to this sheet in the next few weeks. These instructions provide you with information on caring for yourself after your procedure. Your health care provider may also give you more specific instructions. Your treatment has been planned according to current medical practices, but problems sometimes occur. Call your health care provider if you have any problems or questions after your procedure. WHAT TO EXPECT AFTER THE PROCEDURE After your procedure, it is typical to have the following sensations:  Mild discomfort at the catheter insertion site. HOME CARE INSTRUCTIONS   Take all medicines exactly as directed.  If bleeding occurs, hold pressure for 20 minutes. Call 911 if bleeding continues.  Follow any prescribed diet.  Follow instructions regarding both rest and physical activity.  Drink more fluids for the first several days after the procedure in order to help flush dye from your kidneys. SEEK MEDICAL CARE IF:  You develop a rash.  You have fever not controlled by medicine. SEEK IMMEDIATE MEDICAL CARE IF:  There is pain, drainage, bleeding, redness, swelling, warmth or a red streak at the site of the IV tube.  The extremity where your IV tube was placed becomes discolored, numb, or cool.  You have difficulty breathing or shortness of breath.  You develop chest pain.  You have excessive dizziness or fainting. Document Released: 01/23/2013 Document Revised: 04/09/2013 Document Reviewed: 01/23/2013 [facility] Patient Information 2015 [place], [place]. This information is not intended to replace advice given to you by your health care provider. Make sure you discuss any questions you have with your health care provider.

## 2014-07-11 NOTE — Progress Notes (Signed)
Pt request: she would like to take home meds for headache. She takes Fioricet and ativan. Dr [name] was paged.   Page was returned/ pt may take her own meds.

## 2014-07-11 NOTE — Progress Notes (Signed)
Site area: right groin a 7 french venous sheath was removed  Site Prior to Removal:  Level 0  Pressure Applied For 15 MINUTES    Minutes Beginning at 1405p  Manual:   Yes.    Patient Status During Pull:  stable  Post Pull Groin Site:  Level 0  Post Pull Instructions Given:  Yes.    Post Pull Pulses Present:  Yes.    Dressing Applied:  Yes.    Comments:  VS remain stable during sheath pull. Pt denies any discomfort at site.

## 2014-07-11 NOTE — CV Procedure (Signed)
    Cardiac Catheterization Procedure Note  Name: Natasha [name] MRN: [id number] DOB: Sep 23, 1932  Procedure: Right Heart Cath  Indication: Elevated PA pressure by echo.    Procedural Details: The right groin was prepped, draped, and anesthetized with 1% lidocaine. Using the modified Seldinger technique a 7 French sheath was placed in the right femoral vein. A Swan-Ganz catheter was used for the right heart catheterization. Standard protocol was followed for recording of right heart pressures and sampling of oxygen saturations. Fick and thermodilution cardiac output was calculated. There were no immediate procedural complications. The patient was transferred to the post catheterization recovery area for further monitoring.  Procedural Findings: Hemodynamics (mmHg) RA mean 10 RV 41/12 PA 41/18, mean 30 PCWP mean 20 with v-waves to 27  Oxygen saturations: PA 82% AO 99%  Cardiac Output (Fick) 8.65  Cardiac Index (Fick) 4.08 PVR 1.15 WU  Cardiac Output (Thermo) 5.3 Cardiac Index (Thermo) 2.5 PVR 1.89 WU  Final Conclusions:  Preserved cardiac output (though numbers by Fick and thermodilution were somewhat different).  She has mild pulmonary hypertension.  I think that this is primarily pulmonary venous hypertension from elevated left atrial pressures (PCWP 20 with v-waves to 27).  Her echo showed mild MR, so I suspect the prominent v-waves are due to LV diastolic dysfunction.  Right-sided filling pressure is also elevated.   Recommendations:  I would consider increasing home Lasix to lower filling pressures.  Will route note to Drs Turner/Ramaswamy who will make determination of future plan.   [name] 07/11/2014 1:41 PM   [name] [name] 07/11/2014, 1:32 PM

## 2014-07-14 ENCOUNTER — Telehealth: Payer: Self-pay

## 2014-07-14 DIAGNOSIS — I10 Essential (primary) hypertension: Secondary | ICD-10-CM

## 2014-07-14 LAB — POCT I-STAT 3, VENOUS BLOOD GAS (G3P V)
Acid-base deficit: 2 mmol/L (ref 0.0–2.0)
Acid-base deficit: 2 mmol/L (ref 0.0–2.0)
BICARBONATE: 23.4 meq/L (ref 20.0–24.0)
Bicarbonate: 23.4 mEq/L (ref 20.0–24.0)
O2 Saturation: 82 %
O2 Saturation: 84 %
PCO2 VEN: 43 mmHg — AB (ref 45.0–50.0)
PH VEN: 7.344 — AB (ref 7.250–7.300)
TCO2: 25 mmol/L (ref 0–100)
TCO2: 25 mmol/L (ref 0–100)
pCO2, Ven: 41.8 mmHg — ABNORMAL LOW (ref 45.0–50.0)
pH, Ven: 7.356 — ABNORMAL HIGH (ref 7.250–7.300)
pO2, Ven: 49 mmHg — ABNORMAL HIGH (ref 30.0–45.0)
pO2, Ven: 51 mmHg — ABNORMAL HIGH (ref 30.0–45.0)

## 2014-07-14 LAB — POCT I-STAT 3, ART BLOOD GAS (G3+)
BICARBONATE: 25.1 meq/L — AB (ref 20.0–24.0)
O2 Saturation: 99 %
PH ART: 7.372 (ref 7.350–7.450)
PO2 ART: 121 mmHg — AB (ref 80.0–100.0)
TCO2: 26 mmol/L (ref 0–100)
pCO2 arterial: 43.2 mmHg (ref 35.0–45.0)

## 2014-07-14 MED ORDER — FUROSEMIDE 40 MG PO TABS: 40.0000 mg | ORAL_TABLET | Freq: Two times a day (BID) | ORAL | Status: DC

## 2014-07-14 NOTE — Telephone Encounter (Signed)
Per Dr. Mayford Knife, instructed patient to INCREASE LASIX to 40 mg BID.  Repeat BMET scheduled for next Monday. FU appointment scheduled with Tereso Newcomer April 18.  Patient agrees with treatment plan.

## 2014-07-21 ENCOUNTER — Other Ambulatory Visit (INDEPENDENT_AMBULATORY_CARE_PROVIDER_SITE_OTHER): Payer: Medicare Other | Admitting: *Deleted

## 2014-07-21 DIAGNOSIS — I10 Essential (primary) hypertension: Secondary | ICD-10-CM | POA: Diagnosis not present

## 2014-07-21 LAB — BASIC METABOLIC PANEL WITH GFR
BUN: 16 mg/dL (ref 6–23)
CO2: 28 meq/L (ref 19–32)
Calcium: 9.5 mg/dL (ref 8.4–10.5)
Chloride: 102 meq/L (ref 96–112)
Creatinine, Ser: 0.83 mg/dL (ref 0.40–1.20)
GFR: 70.29 mL/min
Glucose, Bld: 91 mg/dL (ref 70–99)
Potassium: 4.2 meq/L (ref 3.5–5.1)
Sodium: 136 meq/L (ref 135–145)

## 2014-07-25 ENCOUNTER — Ambulatory Visit (INDEPENDENT_AMBULATORY_CARE_PROVIDER_SITE_OTHER): Payer: Medicare Other | Admitting: Pharmacist Clinician (PhC)/ Clinical Pharmacy Specialist

## 2014-07-25 DIAGNOSIS — I4891 Unspecified atrial fibrillation: Secondary | ICD-10-CM

## 2014-07-25 LAB — POCT INR: INR: 2.2

## 2014-07-28 ENCOUNTER — Telehealth: Payer: Self-pay | Admitting: Cardiology

## 2014-07-28 NOTE — Telephone Encounter (Signed)
Patient st that since her ablation in October, she has been able to control intermittent afib using cardizem PRN. Patient st she has had consistent afib since Saturday. She has taken her cardizem 30 mg but it is not controlling it.  She would like recommendations. She says she only wants to take more medication if absolutely necessary. She st taking medications "makes her feel like she isn't even a person."  She does not have any symptoms. She has not checked her BP (her machine is out of batteries). Her HR this AM was 102.  To Dr. Mayford Knife for review.

## 2014-07-28 NOTE — Telephone Encounter (Signed)
She needs to contact her EP MD at [facility] who follows her afib post ablation and see what he wants to do

## 2014-07-28 NOTE — Telephone Encounter (Signed)
New Message  Pt wanted to speak w/ Rn about what to do going forward- since Saturday- pt has been experiencing some A-fib- pt took deltiazem- helped. Please call back and discuss.

## 2014-07-29 NOTE — Telephone Encounter (Signed)
Instructed patient to contact her EP MD at [facility] for further instruction. Patient agrees with treatment plan.

## 2014-08-04 ENCOUNTER — Encounter: Payer: Self-pay | Admitting: Physician Assistant

## 2014-08-04 ENCOUNTER — Ambulatory Visit (INDEPENDENT_AMBULATORY_CARE_PROVIDER_SITE_OTHER): Payer: Medicare Other | Admitting: Physician Assistant

## 2014-08-04 VITALS — BP 124/80 | HR 79 | Ht 69.0 in | Wt 216.8 lb

## 2014-08-04 DIAGNOSIS — I272 Pulmonary hypertension, unspecified: Secondary | ICD-10-CM

## 2014-08-04 DIAGNOSIS — I10 Essential (primary) hypertension: Secondary | ICD-10-CM

## 2014-08-04 DIAGNOSIS — I480 Paroxysmal atrial fibrillation: Secondary | ICD-10-CM

## 2014-08-04 DIAGNOSIS — I270 Primary pulmonary hypertension: Secondary | ICD-10-CM

## 2014-08-04 DIAGNOSIS — I5032 Chronic diastolic (congestive) heart failure: Secondary | ICD-10-CM | POA: Diagnosis not present

## 2014-08-04 LAB — BASIC METABOLIC PANEL
BUN: 14 mg/dL (ref 6–23)
CO2: 29 mEq/L (ref 19–32)
Calcium: 9.1 mg/dL (ref 8.4–10.5)
Chloride: 103 mEq/L (ref 96–112)
Creatinine, Ser: 0.82 mg/dL (ref 0.40–1.20)
GFR: 71.27 mL/min (ref >60.00)
GLUCOSE: 95 mg/dL (ref 70–99)
Potassium: 3.9 mEq/L (ref 3.5–5.1)
Sodium: 137 mEq/L (ref 135–145)

## 2014-08-04 LAB — MAGNESIUM: MAGNESIUM: 2.1 mg/dL (ref 1.5–2.5)

## 2014-08-04 LAB — BRAIN NATRIURETIC PEPTIDE: Pro B Natriuretic peptide (BNP): 100 pg/mL (ref 0.0–100.0)

## 2014-08-04 MED ORDER — FUROSEMIDE 40 MG PO TABS: 60.0000 mg | ORAL_TABLET | Freq: Two times a day (BID) | ORAL | Status: DC

## 2014-08-04 MED ORDER — POTASSIUM CHLORIDE ER 10 MEQ PO TBCR: 10.0000 meq | EXTENDED_RELEASE_TABLET | Freq: Two times a day (BID) | ORAL | Status: DC

## 2014-08-04 NOTE — Progress Notes (Signed)
Cardiology Office Note   Date:  08/04/2014   ID:  [name], DOB 1933/01/31, MRN [id number]  PCP:  Pearla Dubonnet, MD  Cardiologist:  Dr. Armanda Magic   Electrophysiologist:  Dr. Macon Large at [facility] Complaint  Patient presents with  . Congestive Heart Failure     History of Present Illness: Natasha [name] is a 81 y.o. female with a hx of HTN, PAF, Hashimoto's Thyroiditis.  She has undergone PVI ablation for AFib as well as focal micro-reentrant ATach catheter ablation with Dr. Virgia Land at [facility].  Last ablation was in 01/2014.  She is on Flecainide to control breakthrough ATach.  She saw Dr. Macon Large last month.  Due to her DOE, there was concern for chronotropic incompetence and her Flecainide was DCd.  Holter is pending.  Sleep testing has been recommended to r/o OSA.  Recently evaluated by Dr. Marchelle Gearing for her dyspnea.  Echo recently demonstrated mild Pulmo HTN with PASP in 40s.  CT was neg for ILD.  There was restriction with low DLCO on PFTs.  Patient unable to do CPST.  Recommendation is to proceed with RHC to better evaluate.    RHC was done by Dr. [name] 3/25.  PCWP was 20.  CO was preserved.  She was felt to have mild pulmonary HTN (primarily pulmonary venous HTN from elevated LA pressures).  This was thought to be 2/2 diastolic dysfunction.  Dr. Armanda Magic increased her Lasix and requested she return today.  She did call in last week with recurrent AFib.  She was asked to call Dr. Macon Large at [facility] for further recommendations.    She continues to feel short of breath.  She is NYHA 2b-3.  She has only noted slight improvement.  She has occ chest pains.  She has noted these since her last ablation.  She denies exertional chest pain.  No syncope. She has occ episodes of dizziness.  She still has occ episodes of AFib.  She does not see EP at [facility] until next month.     Studies/Reports Reviewed Today:  RHC 07/11/14 RA mean 10 RV 41/12 PA 41/18, mean  30 PCWP mean 20 with v-waves to 27 Cardiac Output (Fick) 8.65  Cardiac Index (Fick) 4.08 PVR 1.15 WU Cardiac Output (Thermo) 5.3 Cardiac Index (Thermo) 2.5 PVR 1.89 WU >> mild Pulmo HTN - primarily pulmonary venous HTN from elevated LA pressures (poss from diastolic dysfunction)  Echo 02/2014 - Mild concentric hypertrophy. Systolic function was vigorous. EF 65-70%. Wall motion was normal. Left ventricular diastolic function parameters were normal. - Aortic valve: Trileaflet; normal thickness leaflets. There was no regurgitation. - Aortic root: The aortic root was normal in size. - Mitral valve: Structurally normal valve. There was mild regurgitation. - Left atrium: The atrium was normal in size. - Right ventricle: Systolic function was normal. - Right atrium: The atrium was normal in size. - Tricuspid valve: There was mild regurgitation. - Pulmonic valve: There was mild regurgitation. - Mildly increased. PA peak pressure: 40 mm Hg (S). - Inferior vena cava: The vessel was normal in size. - Pericardium, extracardiac: There was no pericardial effusion.  Myoview 12/2013 Normal stress nuclear study.  LV Ejection Fraction: 76%.     Past Medical History  Diagnosis Date  . GERD (gastroesophageal reflux disease)   . Hypertension   . Seasonal allergies   . Lower leg edema   . Hypothyroidism   . Restless leg syndrome   . DJD (degenerative  joint disease)   . Diverticular disease   . Dysphagia   . Hiatal hernia   . PAF (paroxysmal atrial fibrillation)     s/p ablation done and also pill in the pocket flecainide    Past Surgical History  Procedure Laterality Date  . Rhinoplasty    . Abdominal hysterectomy    . Appendectomy    . Tubal ligation    . Eye surgery      cataracat  . Cardioversion      x3  . Breast surgery      left lumpectomy  . Tonsillectomy  1942  . Thyroidectomy  1973  . Bladder polyps    . Rotator cuff repair    . Esophagogastroduodenoscopy  03/03/2011     Procedure: ESOPHAGOGASTRODUODENOSCOPY (EGD);  Surgeon: Charolett Bumpers, MD;  Location: [facility];  Service: Endoscopy;  Laterality: N/A;  . Balloon dilation  03/03/2011    Procedure: BALLOON DILATION;  Surgeon: Charolett Bumpers, MD;  Location: [facility];  Service: Endoscopy;  Laterality: N/A;  . Atrial fibrillation ablation  09-06-13    2'14 -[facility]  . Cataract extraction Left   . Colonoscopy with propofol N/A 09/24/2013    Procedure: COLONOSCOPY WITH PROPOFOL;  Surgeon: Charolett Bumpers, MD;  Location: [facility]OS[facility];  Service: Endoscopy;  Laterality: N/A;  . Right heart catheterization N/A 07/11/2014    Procedure: RIGHT HEART CATH;  Surgeon: Laurey Morale, MD;  Location: [facility] CATH LAB;  Service: Cardiovascular;  Laterality: N/A;     Current Outpatient Prescriptions  Medication Sig Dispense Refill  . acetaminophen (TYLENOL) 500 MG tablet Take 500 mg by mouth every 6 (six) hours as needed for mild pain or headache.    . butalbital-acetaminophen-caffeine (FIORICET WITH CODEINE) 50-325-40-30 MG per capsule Take 1 capsule by mouth every 4 (four) hours as needed. For migraines    . Calcium Citrate-Vitamin D (CITRACAL + D PO) Take 1 tablet by mouth 2 (two) times daily.    . cholecalciferol (VITAMIN D) 1000 UNITS tablet Take 1,000 Units by mouth daily.    Marland Kitchen diltiazem (CARDIZEM) 30 MG tablet Take 30 mg by mouth daily as needed (for heart rhythm).     . esomeprazole (NEXIUM) 20 MG capsule Take 20 mg by mouth every other day.     . estradiol (ESTRACE) 1 MG tablet Take 1 mg by mouth daily.    . famotidine (PEPCID AC) 10 MG chewable tablet Chew 10 mg by mouth 2 (two) times daily as needed for heartburn.     . furosemide (LASIX) 40 MG tablet Take 1 tablet (40 mg total) by mouth 2 (two) times daily. 60 tablet 6  . hydroxypropyl methylcellulose (ISOPTO TEARS) 2.5 % ophthalmic solution Place 1 drop into both eyes 3 (three) times daily as needed for dry eyes.    Marland Kitchen levothyroxine (SYNTHROID,  LEVOTHROID) 125 MCG tablet Take 125 mcg by mouth daily before breakfast. BRAND ONLY    . LORazepam (ATIVAN) 1 MG tablet Take 0.5-1 mg by mouth at bedtime as needed. For sleep    . Magnesium 500 MG CAPS Take 500 mg by mouth daily.    . nebivolol (BYSTOLIC) 5 MG tablet Take 5 mg by mouth every evening.     . potassium chloride (K-DUR) 10 MEQ tablet Take 1 tablet (10 mEq total) by mouth daily. 30 tablet 5  . warfarin (COUMADIN) 5 MG tablet Take 2.5-5 mg by mouth daily. 5 mg Sunday, Tuesday, Thursday, Saturday. 2.5mg  Monday, Wednesday and Friday  No current facility-administered medications for this visit.    Allergies:   Tramadol; Codeine; Erythromycin; Other; Penicillins; Tetracyclines & related; Diltiazem; and Tetracycline    Social History:  The patient  reports that she has never smoked. She has never used smokeless tobacco. She reports that she drinks alcohol. She reports that she does not use illicit drugs.   Family History:  The patient's family history includes CVA in her father; Cancer in her brother; Heart failure in her father; Hypertension in her sister; Lung cancer in her father; Sudden death in her brother; Transient ischemic attack in her mother. There is no history of Anesthesia problems, Hypotension, Malignant hyperthermia, Pseudochol deficiency, or Heart attack.    ROS:   Please see the history of present illness.   Review of Systems  Constitution: Positive for diaphoresis.  Cardiovascular: Positive for dyspnea on exertion and irregular heartbeat.  Respiratory: Positive for cough.   Musculoskeletal: Positive for back pain, joint pain and myalgias.  All other systems reviewed and are negative.    PHYSICAL EXAM: VS:  BP 124/80 mmHg  Pulse 79  Ht 5\' 9"  (1.753 m)  Wt 216 lb 12.8 oz (98.34 kg)  BMI 32.00 kg/m2    Wt Readings from Last 3 Encounters:  08/04/14 216 lb 12.8 oz (98.34 kg)  07/02/14 215 lb (97.523 kg)  05/15/14 217 lb (98.431 kg)     GEN: Well  nourished, well developed, in no acute distress HEENT: normal Neck: no JVD, no masses Cardiac:  Normal S1/S2, RRR; no murmur ,  no rubs or gallops, no edema  Respiratory:  clear to auscultation bilaterally, no wheezing, rhonchi or rales. GI: soft, nontender, nondistended, + BS MS: no deformity or atrophy Skin: warm and dry  Neuro:  CNs II-XII intact, Strength and sensation are intact Psych: Normal affect   EKG:  EKG is ordered today.  It demonstrates:   NSR, HR 79, low voltage, NSSTTW changes, no change from prior tracing.   Recent Labs: 12/12/2013: TSH 0.16* 01/10/2014: ALT 17; Magnesium 1.9 03/07/2014: Pro B Natriuretic peptide (BNP) 70.0 07/02/2014: Hemoglobin 14.1; Platelets 208.0 07/21/2014: BUN 16; Creatinine 0.83; Potassium 4.2; Sodium 136    Lipid Panel No results found for: CHOL, TRIG, HDL, CHOLHDL, VLDL, LDLCALC, LDLDIRECT    ASSESSMENT AND PLAN:  Pulmonary hypertension As noted, she has mild Pulmo HTN - primarily pulmonary venous HTN from elevated LA pressures (poss from diastolic dysfunction).  Lasix has recently been increased.  She has only had minimal improvement with this. Question if a lot of her dyspnea may be related to deconditioning in the setting of diastolic dysfunction and mild pulmonary hypertension.  Chronic Diastolic CHF As she did have some improvement with the adjusted dose of Lasix, I will increase her Lasix further. Increase Lasix to 60 mg twice a day. Increase potassium to 10 mEq twice a day. Check basic metabolic panel, magnesium, BNP today. Repeat basic metabolic panel in one week.  AF (paroxysmal atrial fibrillation) Maintaining NSR. Continue follow-up with her cardiologist at [facility].  Essential hypertension Controlled.   Current medicines are reviewed at length with the patient today.  The patient does not have concerns regarding medicines.  The following changes have been made:    Increase Lasix to 60 mg twice a day  Increase potassium to 10  mEq twice a day   Labs/ tests ordered today include:   Orders Placed This Encounter  Procedures  . Basic Metabolic Panel (BMET)  . Magnesium  . B Nat Peptide  .  Basic Metabolic Panel (BMET)  . EKG 12-Lead    Disposition:   FU with Dr. Armanda Magic 1 month.   Signed, Brynda Rim, MHS 08/04/2014 2:40 PM    [facility] [address] [place], [place], [place]  [address] Phone: [phone]; Fax: [phone]

## 2014-08-04 NOTE — Patient Instructions (Signed)
Medication Instructions:  1. INCREASE LASIX TO 60 MG TWICE DAILY 2. INCREASE POTASSIUM TO 10 MEQ TWICE DAILY  Labwork: 1. TODAY; BMET, MAGNESIUM LEVEL, BNP 2. REPEAT BMET IN 1 WEEK  Testing/Procedures: NONE  Follow-Up: FOLLOW UP WITH DR. Mayford Knife IN 1 MONTH  Any Other Special Instructions Will Be Listed Below (If Applicable).

## 2014-08-05 ENCOUNTER — Telehealth: Payer: Self-pay | Admitting: *Deleted

## 2014-08-05 NOTE — Telephone Encounter (Signed)
pt notified of lab results today with verbal understanding.

## 2014-08-11 ENCOUNTER — Other Ambulatory Visit (INDEPENDENT_AMBULATORY_CARE_PROVIDER_SITE_OTHER): Payer: Medicare Other | Admitting: *Deleted

## 2014-08-11 DIAGNOSIS — I5032 Chronic diastolic (congestive) heart failure: Secondary | ICD-10-CM | POA: Diagnosis not present

## 2014-08-11 DIAGNOSIS — I10 Essential (primary) hypertension: Secondary | ICD-10-CM | POA: Diagnosis not present

## 2014-08-11 LAB — BASIC METABOLIC PANEL
BUN: 15 mg/dL (ref 6–23)
CO2: 27 mEq/L (ref 19–32)
Calcium: 9.1 mg/dL (ref 8.4–10.5)
Chloride: 102 mEq/L (ref 96–112)
Creatinine, Ser: 0.83 mg/dL (ref 0.40–1.20)
GFR: 70.28 mL/min (ref >60.00)
Glucose, Bld: 121 mg/dL — ABNORMAL HIGH (ref 70–99)
Potassium: 3.7 mEq/L (ref 3.5–5.1)
Sodium: 137 mEq/L (ref 135–145)

## 2014-08-22 ENCOUNTER — Ambulatory Visit (INDEPENDENT_AMBULATORY_CARE_PROVIDER_SITE_OTHER): Payer: Medicare Other | Admitting: Pharmacist Clinician (PhC)/ Clinical Pharmacy Specialist

## 2014-08-22 DIAGNOSIS — I4891 Unspecified atrial fibrillation: Secondary | ICD-10-CM | POA: Diagnosis not present

## 2014-08-22 LAB — POCT INR: INR: 1.8

## 2014-09-02 ENCOUNTER — Ambulatory Visit (INDEPENDENT_AMBULATORY_CARE_PROVIDER_SITE_OTHER)
Admission: RE | Admit: 2014-09-02 | Discharge: 2014-09-02 | Disposition: A | Discharge status: Home / Self Care | Payer: Medicare Other | Source: Ambulatory Visit | Attending: Internal Medicine | Admitting: Internal Medicine

## 2014-09-02 ENCOUNTER — Encounter: Payer: Self-pay | Admitting: Cardiology

## 2014-09-02 ENCOUNTER — Ambulatory Visit (INDEPENDENT_AMBULATORY_CARE_PROVIDER_SITE_OTHER): Payer: Medicare Other | Admitting: Cardiology

## 2014-09-02 VITALS — BP 128/80 | HR 78 | Ht 69.0 in | Wt 213.8 lb

## 2014-09-02 DIAGNOSIS — R918 Other nonspecific abnormal finding of lung field: Secondary | ICD-10-CM | POA: Diagnosis not present

## 2014-09-02 DIAGNOSIS — I270 Primary pulmonary hypertension: Secondary | ICD-10-CM

## 2014-09-02 DIAGNOSIS — R0602 Shortness of breath: Secondary | ICD-10-CM | POA: Diagnosis not present

## 2014-09-02 DIAGNOSIS — I480 Paroxysmal atrial fibrillation: Secondary | ICD-10-CM

## 2014-09-02 DIAGNOSIS — I10 Essential (primary) hypertension: Secondary | ICD-10-CM | POA: Diagnosis not present

## 2014-09-02 DIAGNOSIS — R0600 Dyspnea, unspecified: Secondary | ICD-10-CM

## 2014-09-02 DIAGNOSIS — I272 Pulmonary hypertension, unspecified: Secondary | ICD-10-CM

## 2014-09-02 NOTE — Progress Notes (Signed)
Cardiology Office Note   Date:  09/02/2014   ID:  Natasha [name], DOB [birth year], MRN [id number]  PCP:  Pearla Dubonnet, MD    Chief Complaint  Patient presents with  . Follow-up    AF      History of Present Illness: Natasha [name] is a 81 y.o. female with a hx of HTN, PAF, Hashimoto's Thyroiditis. She has undergone PVI ablation for AFib as well as focal micro-reentrant ATach catheter ablation with Dr. Virgia Land at [facility]. Last ablation was in 01/2014. She is on Flecainide to control breakthrough ATach.  Due to her DOE, there was concern for chronotropic incompetence and her Flecainide was DCd. Marland Kitchen  Recently evaluated by Dr. Marchelle Gearing for her dyspnea. Echo recently demonstrated mild Pulmo HTN with PASP in 40s. CT was neg for ILD. There was restriction with low DLCO on PFTs. Patient unable to do CPST. Recommendation is to proceed with RHC to better evaluate.   RHC was done by Dr. [name] 3/25. PCWP was 20. CO was preserved. She was felt to have mild pulmonary HTN (primarily pulmonary venous HTN from elevated LA pressures). This was thought to be 2/2 diastolic dysfunction.   Her SOB has improved and she only has chest pressure if she has a bout of rapid afib.  She says that her episodes of afib are less frequent and do not last as long since the last ablation and she does not always have to take the diltiazem.  She is NYHA 2b.   She denies exertional chest pain. No syncope. She still has occ episodes of AFib.   Past Medical History  Diagnosis Date  . GERD (gastroesophageal reflux disease)   . Hypertension   . Seasonal allergies   . Lower leg edema   . Hypothyroidism   . Restless leg syndrome   . DJD (degenerative joint disease)   . Diverticular disease   . Dysphagia   . Hiatal hernia   . PAF (paroxysmal atrial fibrillation)     s/p ablation done and also pill in the pocket flecainide    Past Surgical History  Procedure Laterality Date  .  Rhinoplasty    . Abdominal hysterectomy    . Appendectomy    . Tubal ligation    . Eye surgery      cataracat  . Cardioversion      x3  . Breast surgery      left lumpectomy  . Tonsillectomy  1942  . Thyroidectomy  1973  . Bladder polyps    . Rotator cuff repair    . Esophagogastroduodenoscopy  03/03/2011    Procedure: ESOPHAGOGASTRODUODENOSCOPY (EGD);  Surgeon: Charolett Bumpers, MD;  Location: [facility];  Service: Endoscopy;  Laterality: N/A;  . Balloon dilation  03/03/2011    Procedure: BALLOON DILATION;  Surgeon: Charolett Bumpers, MD;  Location: [facility];  Service: Endoscopy;  Laterality: N/A;  . Atrial fibrillation ablation  09-06-13    2'14 -[facility]  . Cataract extraction Left   . Colonoscopy with propofol N/A 09/24/2013    Procedure: COLONOSCOPY WITH PROPOFOL;  Surgeon: Charolett Bumpers, MD;  Location: [facility]OS[facility];  Service: Endoscopy;  Laterality: N/A;  . Right heart catheterization N/A 07/11/2014    Procedure: RIGHT HEART CATH;  Surgeon: Laurey Morale, MD;  Location: [facility] CATH LAB;  Service: Cardiovascular;  Laterality: N/A;     Current Outpatient Prescriptions  Medication Sig Dispense Refill  . acetaminophen (TYLENOL) 500 MG tablet Take  500 mg by mouth every 6 (six) hours as needed for mild pain or headache.    . butalbital-acetaminophen-caffeine (FIORICET WITH CODEINE) 50-325-40-30 MG per capsule Take 1 capsule by mouth every 4 (four) hours as needed. For migraines    . Calcium Citrate-Vitamin D (CITRACAL + D PO) Take 1 tablet by mouth 2 (two) times daily.    . cholecalciferol (VITAMIN D) 1000 UNITS tablet Take 1,000 Units by mouth daily.    Marland Kitchen diltiazem (CARDIZEM) 30 MG tablet Take 30 mg by mouth daily as needed (for heart rhythm).     . esomeprazole (NEXIUM) 20 MG capsule Take 20 mg by mouth every other day.     . estradiol (ESTRACE) 1 MG tablet Take 1 mg by mouth daily.    . famotidine (PEPCID AC) 10 MG chewable tablet Chew 10 mg by mouth 2 (two) times  daily as needed for heartburn.     . furosemide (LASIX) 40 MG tablet Take 1.5 tablets (60 mg total) by mouth 2 (two) times daily. 90 tablet 6  . hydroxypropyl methylcellulose (ISOPTO TEARS) 2.5 % ophthalmic solution Place 1 drop into both eyes 3 (three) times daily as needed for dry eyes.    Marland Kitchen levothyroxine (SYNTHROID, LEVOTHROID) 125 MCG tablet Take 125 mcg by mouth daily before breakfast. BRAND ONLY    . LORazepam (ATIVAN) 1 MG tablet Take 0.5-1 mg by mouth at bedtime as needed. For sleep    . Magnesium 500 MG CAPS Take 500 mg by mouth daily.    . nebivolol (BYSTOLIC) 5 MG tablet Take 5 mg by mouth every evening.     . potassium chloride (K-DUR) 10 MEQ tablet Take 1 tablet (10 mEq total) by mouth 2 (two) times daily. 60 tablet 6  . warfarin (COUMADIN) 5 MG tablet Take 2.5-5 mg by mouth daily. 5 mg Sunday, Tuesday, Thursday, Saturday. 2.5mg  Monday, Wednesday and Friday     No current facility-administered medications for this visit.    Allergies:   Tramadol; Codeine; Erythromycin; Other; Penicillins; Tetracyclines & related; Diltiazem; and Tetracycline    Social History:  The patient  reports that she has never smoked. She has never used smokeless tobacco. She reports that she drinks alcohol. She reports that she does not use illicit drugs.   Family History:  The patient's family history includes CVA in her father; Cancer in her brother; Heart failure in her father; Hypertension in her sister; Lung cancer in her father; Sudden death in her brother; Transient ischemic attack in her mother. There is no history of Anesthesia problems, Hypotension, Malignant hyperthermia, Pseudochol deficiency, or Heart attack.    ROS:  Please see the history of present illness.   Otherwise, review of systems are positive for none.   All other systems are reviewed and negative.    PHYSICAL EXAM: VS:  BP 128/80 mmHg  Pulse 78  Ht 5\' 9"  (1.753 m)  Wt 213 lb 12.8 oz (96.979 kg)  BMI 31.56 kg/m2  SpO2 98% ,  BMI Body mass index is 31.56 kg/(m^2). GEN: Well nourished, well developed, in no acute distress HEENT: normal Neck: no JVD, carotid bruits, or masses Cardiac: RRR; no murmurs, rubs, or gallops,no edema  Respiratory:  clear to auscultation bilaterally, normal work of breathing GI: soft, nontender, nondistended, + BS MS: no deformity or atrophy Skin: warm and dry, no rash Neuro:  Strength and sensation are intact Psych: euthymic mood, full affect   EKG:  EKG is not ordered today.    Recent  Labs: 12/12/2013: TSH 0.16* 01/10/2014: ALT 17 07/02/2014: Hemoglobin 14.1; Platelets 208.0 08/04/2014: Magnesium 2.1; Pro B Natriuretic peptide (BNP) 100.0 08/11/2014: BUN 15; Creatinine 0.83; Potassium 3.7; Sodium 137    Lipid Panel No results found for: CHOL, TRIG, HDL, CHOLHDL, VLDL, LDLCALC, LDLDIRECT    Wt Readings from Last 3 Encounters:  09/02/14 213 lb 12.8 oz (96.979 kg)  08/04/14 216 lb 12.8 oz (98.34 kg)  07/02/14 215 lb (97.523 kg)    ASSESSMENT AND PLAN:  Pulmonary hypertension As noted, she has mild Pulmo HTN - primarily pulmonary venous HTN from elevated LA pressures (poss from diastolic dysfunction). Lasix has recently been increased and her SOB has improved.    Chronic Diastolic CHF - appears euvolemic Continue Lasix.  Last BMET was fine.  AF (paroxysmal atrial fibrillation) Maintaining NSR. Continue follow-up with her cardiologist at [facility].  Essential hypertension Controlled.     Current medicines are reviewed at length with the patient today.  The patient does not have concerns regarding medicines.  The following changes have been made:  no change  Labs/ tests ordered today include: see above assessment and plan No orders of the defined types were placed in this encounter.     Disposition:   FU with me in 6 months   Signed, Quintella Reichert, MD  09/02/2014 1:50 PM    [facility] [address] [place], [place], [place]  [address] Phone:  [phone]; Fax: [phone]

## 2014-09-02 NOTE — Patient Instructions (Signed)

## 2014-09-05 ENCOUNTER — Encounter: Payer: Self-pay | Admitting: Internal Medicine

## 2014-09-05 ENCOUNTER — Ambulatory Visit (INDEPENDENT_AMBULATORY_CARE_PROVIDER_SITE_OTHER): Payer: Medicare Other | Admitting: Internal Medicine

## 2014-09-05 VITALS — BP 124/86 | HR 73 | Ht 69.0 in | Wt 215.0 lb

## 2014-09-05 DIAGNOSIS — R0600 Dyspnea, unspecified: Secondary | ICD-10-CM | POA: Diagnosis not present

## 2014-09-05 DIAGNOSIS — R911 Solitary pulmonary nodule: Secondary | ICD-10-CM | POA: Diagnosis not present

## 2014-09-05 NOTE — Progress Notes (Signed)
Subjective:    Patient ID: Natasha [name], female    DOB: Oct 31, 1932, 81 y.o.   MRN: [id number]  HPI PCP Pearla Dubonnet, MD Cards: Dr Armanda Magic EP: Dr Velta Addison at [place] [facility]PI  IOV 05/15/2014  Chief Complaint  Patient presents with  . Pulmonary Consult    Pt referred by Dr. Kevan Ny for dyspnea with activity. Pt c/o mild dry cough.     81 year old obese lady with the multiple medical problems and atrial fibrillation. Referred for dyspnea. She's had a long-standing history with persistent atrial fibrillation. In 2014 was evaluated by a Dr. Velta Addison at [facility] and was status post ablation 2 years ago. However a to fibrillation recurred in 2015 and she's been on  Flecainide for several months along with baseline diltiazem and bysytolic. Then subsequently in late 2015 underwent a second ablation and according to her history it was an incomplete procedure and since then she feels that she's had dyspnea on exertion which she notices for climbing flight of stairs and walking more than 500 feet on level ground. It is relieved by rest. She is concerned it may be related to her flecainide. She does have mild chronic cough with mild postnasal drainage but this is old persistent and chronic and unchanged and is unrelated to her dyspnea. There is no associated wheezing or pedal edema. She had a recent spirometry that suggestive restriction with low DLCO and a chest x-ray that suggested interstitial lung disease changes and this is an added concern and she's been referred here   CT chest 08/23/2005: Clear lung fields but with a large hiatal hernia   CT sinus 12/02/2005: No evidence of sinusitis   Spirometry with DLCO: 05/12/2014: FVC 2.35/72%, FEV1 1.8 L/72% with a ratio of 102%. Suggestive of restriction. DLCO of 18.2/58%. Overall spirometry suggests restriction with low diffusion capacity.  Chest x-ray 04/24/2014: Suggests possible ILD but she also has a hiatal hernia  ECHO 03/19/14- normal.  PASP 40s per report  Walking desaturation test 185 feet 3 laps on room air: She got dyspneic at the second lap but was able to continue and finish the third lap. Heart rate remained in the 70s   REC workup  OV 09/05/2014   Chief Complaint  Patient presents with  . Follow-up    Pt here to f/u after super D CT chest. Pt stated her breathing has improved. Pt c/o mild dry cough which increases when lying down. Pt denies CP/tightness.    Follow-up dyspnea  - Has completed her workup. All documented below.. After Lasix following right heart catheterization dyspnea is 50% better. But she still has 50% residual dyspnea which is present on exertion relieved by rest. She's not interested in a trial of pulmonary rehabilitation or inhaler therapy. She is known to have hiatal hernia that is moderate in size  Follow-up lung nodule right upper lobe  - This is somewhat decreased between February 2016 andMay 2016   Dyspnea workup    - . Hiatal hernia also seen - this is known to her. Moderate in size   - ONO test - ono 2/2/216 revwed - total time </= 88% st 20sec. Basically normal. No need for o2 at night.     - Right heart cath Procedural Findings: Hemodynamics (mmHg) RA mean 10 RV 41/12 PA 41/18, mean 30 PCWP mean 20 with v-waves to 27  Oxygen saturations: PA 82% AO 99%  Cardiac Output (Fick) 8.65  Cardiac Index (Fick) 4.08 PVR 1.15 WU  Cardiac Output (Thermo) 5.3 Cardiac Index (Thermo) 2.5 PVR 1.89 WU  Final Conclusions: Preserved cardiac output (though numbers by Fick and thermodilution were somewhat different). She has mild pulmonary hypertension. I think that this is primarily pulmonary venous hypertension from elevated left atrial pressures (PCWP 20 with v-waves to 27). Her echo showed mild MR, so I suspect the prominent v-waves are due to LV diastolic dysfunction. Right-sided filling pressure is also elevated.   IMPRESSION: CT chest May 2016  1. A right upper  lobe pulmonary nodule measures slightly smaller than on the exam of 05/21/2014. Favored to be due to differences in slice selection. This may have enlarged minimally since 2007, therefore warrants followup attention. 2. Other pulmonary nodules which measure 4 mm or less. If the patient is at high risk for bronchogenic carcinoma, follow-up chest CT at 1 year is recommended. If the patient is at low risk, no follow-up is needed. This recommendation follows the consensus statement: "Guidelines for Management of Small Pulmonary Nodules Detected on CT Scans: A Statement from the Fleischner Society" as published in Radiology 2005; 237:395-400. Available online at: DietDisorder.cz.   Electronically Signed  By: Jeronimo Greaves M.D.  On: 09/02/2014 16:55   Review of Systems  Constitutional: Negative for fever and unexpected weight change.  HENT: Negative for congestion, dental problem, ear pain, nosebleeds, postnasal drip, rhinorrhea, sinus pressure, sneezing, sore throat and trouble swallowing.   Eyes: Negative for redness and itching.  Respiratory: Positive for cough and shortness of breath. Negative for chest tightness and wheezing.   Cardiovascular: Negative for palpitations and leg swelling.  Gastrointestinal: Negative for nausea and vomiting.  Genitourinary: Negative for dysuria.  Musculoskeletal: Negative for joint swelling.  Skin: Negative for rash.  Neurological: Negative for headaches.  Hematological: Does not bruise/bleed easily.  Psychiatric/Behavioral: Negative for dysphoric mood. The patient is not nervous/anxious.        Objective:   Physical Exam  Filed Vitals:   09/05/14 1402  BP: 124/86  Pulse: 73  Height:  (1.753 m)  Weight: 215 lb (97.523 kg)  SpO2: 98%   Discussion only visit      Assessment & Plan:     ICD-9-CM ICD-10-CM   1. Dyspnea 786.09 R06.00   2. Nodule of right lung 793.11 R91.1 CT Chest Wo Contrast    Dyspnea  - glad you better  - discussed pulmonary rehabilitation or empiric inhaler therapy but is discussed we will hold this off - At some point you will have to  discuss with her doctors if your hiatal hernia is contributing to shortness of breath  Nodule of right lung - Right upper lobe nodule stable/slightly decreased February 2016 toMay 2016  - Do next CT scan of the chest without contrast in 6 months   Follow-up- 6 months after CT scan of the chest   (> 50% of this 15 min visit spent in face to face counseling or/and coordination of care)   Dr. Kalman Shan, M.D., [facility].C.P Pulmonary and Critical Care Medicine Staff Physician  System [facility] Pager: [phone], If no answer or between  15:00h - 7:00h: call [phone]  09/07/2014 4:08 PM

## 2014-09-05 NOTE — Patient Instructions (Signed)
Dyspnea  - glad you better  - discussed pulmonary rehabilitation or empiric inhaler therapy but is discussed we will hold this off - At some point you will have to  discuss with her doctors if your hiatal hernia is contributing to shortness of breath  Nodule of right lung - Right upper lobe nodule stable/slightly decreased February 2016 toMay 2016  - Do next CT scan of the chest without contrast in 6 months   Follow-up- 6 months after CT scan of the chest

## 2014-09-07 DIAGNOSIS — R911 Solitary pulmonary nodule: Secondary | ICD-10-CM | POA: Insufficient documentation

## 2014-09-07 DIAGNOSIS — R0600 Dyspnea, unspecified: Principal | ICD-10-CM

## 2014-09-19 ENCOUNTER — Ambulatory Visit (INDEPENDENT_AMBULATORY_CARE_PROVIDER_SITE_OTHER): Payer: Medicare Other | Admitting: Pharmacist Clinician (PhC)/ Clinical Pharmacy Specialist

## 2014-09-19 DIAGNOSIS — I4891 Unspecified atrial fibrillation: Secondary | ICD-10-CM

## 2014-09-19 LAB — POCT INR: INR: 2.1

## 2014-10-17 ENCOUNTER — Ambulatory Visit (INDEPENDENT_AMBULATORY_CARE_PROVIDER_SITE_OTHER): Payer: Medicare Other | Admitting: Pharmacist Clinician (PhC)/ Clinical Pharmacy Specialist

## 2014-10-17 DIAGNOSIS — I4891 Unspecified atrial fibrillation: Secondary | ICD-10-CM

## 2014-10-17 LAB — POCT INR: INR: 2.7

## 2014-10-24 DIAGNOSIS — H2513 Age-related nuclear cataract, bilateral: Secondary | ICD-10-CM | POA: Diagnosis not present

## 2014-11-03 DIAGNOSIS — Z79899 Other long term (current) drug therapy: Secondary | ICD-10-CM | POA: Diagnosis not present

## 2014-11-03 DIAGNOSIS — K219 Gastro-esophageal reflux disease without esophagitis: Secondary | ICD-10-CM | POA: Diagnosis not present

## 2014-11-03 DIAGNOSIS — R5382 Chronic fatigue, unspecified: Secondary | ICD-10-CM | POA: Diagnosis not present

## 2014-11-03 DIAGNOSIS — I10 Essential (primary) hypertension: Secondary | ICD-10-CM | POA: Diagnosis not present

## 2014-11-03 DIAGNOSIS — R252 Cramp and spasm: Secondary | ICD-10-CM | POA: Diagnosis not present

## 2014-11-03 DIAGNOSIS — E039 Hypothyroidism, unspecified: Secondary | ICD-10-CM | POA: Diagnosis not present

## 2014-11-03 DIAGNOSIS — M2550 Pain in unspecified joint: Secondary | ICD-10-CM | POA: Diagnosis not present

## 2014-11-03 DIAGNOSIS — R0609 Other forms of dyspnea: Secondary | ICD-10-CM | POA: Diagnosis not present

## 2014-11-03 DIAGNOSIS — I119 Hypertensive heart disease without heart failure: Secondary | ICD-10-CM | POA: Diagnosis not present

## 2014-11-03 DIAGNOSIS — G2581 Restless legs syndrome: Secondary | ICD-10-CM | POA: Diagnosis not present

## 2014-11-03 DIAGNOSIS — I4891 Unspecified atrial fibrillation: Secondary | ICD-10-CM | POA: Diagnosis not present

## 2014-11-03 DIAGNOSIS — M545 Low back pain: Secondary | ICD-10-CM | POA: Diagnosis not present

## 2014-11-03 DIAGNOSIS — G43009 Migraine without aura, not intractable, without status migrainosus: Secondary | ICD-10-CM | POA: Diagnosis not present

## 2014-11-14 ENCOUNTER — Ambulatory Visit (INDEPENDENT_AMBULATORY_CARE_PROVIDER_SITE_OTHER): Payer: Medicare Other | Admitting: Pharmacist Clinician (PhC)/ Clinical Pharmacy Specialist

## 2014-11-14 DIAGNOSIS — I4891 Unspecified atrial fibrillation: Secondary | ICD-10-CM

## 2014-11-14 LAB — POCT INR: INR: 2.3

## 2014-11-18 DIAGNOSIS — E063 Autoimmune thyroiditis: Secondary | ICD-10-CM | POA: Diagnosis not present

## 2014-11-18 DIAGNOSIS — E039 Hypothyroidism, unspecified: Secondary | ICD-10-CM | POA: Diagnosis not present

## 2014-12-01 DIAGNOSIS — M25659 Stiffness of unspecified hip, not elsewhere classified: Secondary | ICD-10-CM | POA: Diagnosis not present

## 2014-12-01 DIAGNOSIS — I10 Essential (primary) hypertension: Secondary | ICD-10-CM | POA: Diagnosis not present

## 2014-12-01 DIAGNOSIS — G2581 Restless legs syndrome: Secondary | ICD-10-CM | POA: Diagnosis not present

## 2014-12-01 DIAGNOSIS — Z79899 Other long term (current) drug therapy: Secondary | ICD-10-CM | POA: Diagnosis not present

## 2014-12-01 DIAGNOSIS — R252 Cramp and spasm: Secondary | ICD-10-CM | POA: Diagnosis not present

## 2014-12-01 DIAGNOSIS — R700 Elevated erythrocyte sedimentation rate: Secondary | ICD-10-CM | POA: Diagnosis not present

## 2014-12-01 DIAGNOSIS — R0609 Other forms of dyspnea: Secondary | ICD-10-CM | POA: Diagnosis not present

## 2014-12-01 DIAGNOSIS — R5382 Chronic fatigue, unspecified: Secondary | ICD-10-CM | POA: Diagnosis not present

## 2014-12-16 DIAGNOSIS — R51 Headache: Secondary | ICD-10-CM | POA: Diagnosis not present

## 2014-12-16 DIAGNOSIS — Z79899 Other long term (current) drug therapy: Secondary | ICD-10-CM | POA: Diagnosis not present

## 2014-12-16 DIAGNOSIS — R252 Cramp and spasm: Secondary | ICD-10-CM | POA: Diagnosis not present

## 2014-12-16 DIAGNOSIS — I10 Essential (primary) hypertension: Secondary | ICD-10-CM | POA: Diagnosis not present

## 2014-12-16 DIAGNOSIS — M25659 Stiffness of unspecified hip, not elsewhere classified: Secondary | ICD-10-CM | POA: Diagnosis not present

## 2014-12-16 DIAGNOSIS — R0609 Other forms of dyspnea: Secondary | ICD-10-CM | POA: Diagnosis not present

## 2014-12-16 DIAGNOSIS — Z23 Encounter for immunization: Secondary | ICD-10-CM | POA: Diagnosis not present

## 2014-12-16 DIAGNOSIS — R700 Elevated erythrocyte sedimentation rate: Secondary | ICD-10-CM | POA: Diagnosis not present

## 2014-12-16 DIAGNOSIS — R5382 Chronic fatigue, unspecified: Secondary | ICD-10-CM | POA: Diagnosis not present

## 2014-12-16 DIAGNOSIS — G2581 Restless legs syndrome: Secondary | ICD-10-CM | POA: Diagnosis not present

## 2014-12-17 DIAGNOSIS — I480 Paroxysmal atrial fibrillation: Secondary | ICD-10-CM | POA: Diagnosis not present

## 2014-12-26 ENCOUNTER — Ambulatory Visit: Payer: Medicare Other | Admitting: Pharmacist Clinician (PhC)/ Clinical Pharmacy Specialist

## 2014-12-26 DIAGNOSIS — I480 Paroxysmal atrial fibrillation: Secondary | ICD-10-CM | POA: Diagnosis not present

## 2014-12-29 ENCOUNTER — Ambulatory Visit (INDEPENDENT_AMBULATORY_CARE_PROVIDER_SITE_OTHER): Payer: Medicare Other | Admitting: Cardiology

## 2014-12-29 ENCOUNTER — Telehealth: Payer: Self-pay | Admitting: Cardiology

## 2014-12-29 ENCOUNTER — Encounter: Payer: Self-pay | Admitting: *Deleted

## 2014-12-29 ENCOUNTER — Encounter: Payer: Self-pay | Admitting: Cardiology

## 2014-12-29 ENCOUNTER — Ambulatory Visit (INDEPENDENT_AMBULATORY_CARE_PROVIDER_SITE_OTHER): Payer: Medicare Other | Admitting: Pharmacist Clinician (PhC)/ Clinical Pharmacy Specialist

## 2014-12-29 VITALS — BP 104/80 | HR 131 | Ht 69.0 in | Wt 210.0 lb

## 2014-12-29 DIAGNOSIS — I10 Essential (primary) hypertension: Secondary | ICD-10-CM | POA: Diagnosis not present

## 2014-12-29 DIAGNOSIS — I272 Pulmonary hypertension, unspecified: Secondary | ICD-10-CM

## 2014-12-29 DIAGNOSIS — I4891 Unspecified atrial fibrillation: Secondary | ICD-10-CM

## 2014-12-29 DIAGNOSIS — I270 Primary pulmonary hypertension: Secondary | ICD-10-CM

## 2014-12-29 DIAGNOSIS — I480 Paroxysmal atrial fibrillation: Secondary | ICD-10-CM

## 2014-12-29 LAB — BASIC METABOLIC PANEL
BUN: 13 mg/dL (ref 7–25)
CO2: 28 mmol/L (ref 20–31)
CREATININE: 0.82 mg/dL (ref 0.60–0.88)
Calcium: 8.6 mg/dL (ref 8.6–10.4)
Chloride: 105 mmol/L (ref 98–110)
Glucose, Bld: 92 mg/dL (ref 65–99)
Potassium: 4.5 mmol/L (ref 3.5–5.3)
Sodium: 140 mmol/L (ref 135–146)

## 2014-12-29 LAB — CBC
HCT: 42.1 % (ref 36.0–46.0)
HEMOGLOBIN: 14.7 g/dL (ref 12.0–15.0)
MCH: 33.6 pg (ref 26.0–34.0)
MCHC: 34.9 g/dL (ref 30.0–36.0)
MCV: 96.3 fL (ref 78.0–100.0)
MPV: 10.1 fL (ref 8.6–12.4)
PLATELETS: 236 10*3/uL (ref 150–400)
RBC: 4.37 MIL/uL (ref 3.87–5.11)
RDW: 13.4 % (ref 11.5–15.5)
WBC: 6.3 10*3/uL (ref 4.0–10.5)

## 2014-12-29 LAB — POCT INR: INR: 2.6

## 2014-12-29 NOTE — Patient Instructions (Signed)
Your physician has recommended that you have a Cardioversion (DCCV). Electrical Cardioversion uses a jolt of electricity to your heart either through paddles or wired patches attached to your chest. This is a controlled, usually prescheduled, procedure. Defibrillation is done under light anesthesia in the hospital, and you usually go home the day of the procedure. This is done to get your heart back into a normal rhythm. You are not awake for the procedure. Please see the instruction sheet given to you today.   Your physician recommends that you HAVE LAB WORK TODAY

## 2014-12-29 NOTE — Telephone Encounter (Signed)
New message  Pt c/o BP issue:  1. What are your last 5 BP readings? Doesn't have the last 5 readings. She takes it on her Wrist. BP has been low and it stays in the 90's and the dystolic stays in the 60's   107/76 HR 102 @ 8:30am 109/78 HR 102  No time available 109/72 HR 103    94/74 HR 100  2. Are you having any other symptoms (ex. Dizziness, headache, blurred vision, passed out)? Just weak and absolutley wiped out. Never felt so down as if she is on her way out..   Comments: Pt is afib and heart rate stays over 100 to104. Havent been able to get it down and requests a call back to discuss what to do next. Please call

## 2014-12-29 NOTE — Assessment & Plan Note (Signed)
Blood pressure controlled. Continue present medications. 

## 2014-12-29 NOTE — Assessment & Plan Note (Signed)
Mild and felt secondary to pulmonary venous hypertension.

## 2014-12-29 NOTE — Assessment & Plan Note (Signed)
Patient has developed recurrent atrial fibrillation and she is extremely symptomatic. Her INRs have been therapeutic. I will arrange cardioversion tomorrow. I have asked her to take 30 mg of Cardizem this evening. She will continue Coumadin. We also discussed adding antiarrhythmics to prevent recurrences. However she has had multiple medication intolerances. She apparently felt poorly on flecanide and multaq. We discussed Tikosyn and amiodarone but she is concerned about potential side effects and does not want to consider these. Hopefully she will hold sinus rhythm following cardioversion. I will then ask her to follow-up with Dr. Mayford Knife and [facility] for further suggestions concerning management of her atrial fibrillation. She could also be seen in the atrial fibrillation clinic here in [place].

## 2014-12-29 NOTE — Telephone Encounter (Signed)
Pt in office for INR, therapeutic at 2.6.  HR 150s.  Will do EKG before patient leaves office

## 2014-12-29 NOTE — Telephone Encounter (Signed)
Patient c/o "feeling like dirt" since she went to Dr. Elige Radon office 2 weeks ago. She st her BP is low (see BPs below) and her HR is 104 this AM.  She is very concerned because her "HR should not be over 100 because it will make her heart enlarge." Before her appointment with Dr. Claudean Severance, she said she was doing well. She would have "short bursts" of a-fib and tachycardia, but overall was doing really well. At her OV with Burnadette Peter was prescribed. Ever since, she has "felt awful" and she has been in a-fib. She complains of a-fib, increased heart rate, low blood pressure, and extreme fatigue. Patient has been in contact with Dr. Elige Radon office and they ordered a holter monitor. She gave it back today and is awaiting results/recommendations. She also started Mirapex about a month ago - she does not know if this medication could have caused her a-fib relapse. Patient is frustrated and wants to know if Dr. Mayford Knife has any recommendations for her.

## 2014-12-29 NOTE — Telephone Encounter (Signed)
Patient c/o feeling like "crap".  Defines it as feeling too worn out to walk.  History of feeling this way for about 2 weeks  No shortness of breath, chest pain or dizziness.  EkG complete: A fib RVR 130's.  BP 104/80  Discussed above with Dr. Jens Som.  INR for today and six weeks ago verbalized to Dr. Jens Som from [facility]  Checked in as regular patient for Dr. Jens Som

## 2014-12-29 NOTE — Progress Notes (Signed)
HPI: follow-up atrial fibrillation. Patient is followed by Dr. Mayford Knife. She was seen in the Coumadin clinic today and noted to have recurrent atrial fibrillation with a rapid ventricular response and was added to my schedule. Patient has had 2 ablations at [facility]. Nuclear study September 2015 showed ejection fraction 76% and normal perfusion. Last echocardiogram December 2015 showed normal LV function, mild mitral regurgitation and tricuspid regurgitation. Mildly elevated pulmonary pressures. Patient had right heart catheterization in March 2016 which revealed a pulmonary capillary wedge pressure of 20 with preserved cardiac output. Pulmonary venous hypertension felt secondary to diastolic dysfunction.  Patient apparently developed atrial fibrillation 2 weeks ago. She notes associated weakness, dyspnea and fatigue. If her heart rate increases significantly she will feel chest tightness she contacted [facility] and was placed on multaq. However she did not convert and apparently the medication made her feel bad and didn't discontinue this medication. They told her not to take Cardizem as they felt her blood pressure was too low. When she is not in atrial fibrillation she denies dyspnea, chest pain or syncope.  Current Outpatient Prescriptions  Medication Sig Dispense Refill  . acetaminophen (TYLENOL) 500 MG tablet Take 500 mg by mouth every 6 (six) hours as needed for mild pain or headache.    . butalbital-acetaminophen-caffeine (FIORICET WITH CODEINE) 50-325-40-30 MG per capsule Take 1 capsule by mouth every 4 (four) hours as needed. For migraines    . cholecalciferol (VITAMIN D) 1000 UNITS tablet Take 1,000 Units by mouth daily.    Marland Kitchen esomeprazole (NEXIUM) 20 MG capsule Take 20 mg by mouth every other day.     . estradiol (ESTRACE) 1 MG tablet Take 1 mg by mouth daily.    . famotidine (PEPCID AC) 10 MG chewable tablet Chew 10 mg by mouth 2 (two) times daily as needed for heartburn.      . furosemide (LASIX) 40 MG tablet Take 1.5 tablets (60 mg total) by mouth 2 (two) times daily. (Patient taking differently: Take 40 mg by mouth 2 (two) times daily. ) 90 tablet 6  . hydroxypropyl methylcellulose (ISOPTO TEARS) 2.5 % ophthalmic solution Place 1 drop into both eyes 3 (three) times daily as needed for dry eyes.    Marland Kitchen levothyroxine (SYNTHROID, LEVOTHROID) 125 MCG tablet Take 125 mcg by mouth daily before breakfast. BRAND ONLY    . LORazepam (ATIVAN) 1 MG tablet Take 0.5-1 mg by mouth at bedtime as needed. For sleep    . Magnesium 500 MG CAPS Take 500 mg by mouth daily.    . nebivolol (BYSTOLIC) 5 MG tablet Take 5 mg by mouth every evening.     . potassium chloride (K-DUR) 10 MEQ tablet Take 1 tablet (10 mEq total) by mouth 2 (two) times daily. 60 tablet 6  . warfarin (COUMADIN) 5 MG tablet Take 2.5-5 mg by mouth daily. 5 mg Sunday, Tuesday, Thursday, Saturday. 2.5mg  Monday, Wednesday and Friday     No current facility-administered medications for this visit.     Past Medical History  Diagnosis Date  . GERD (gastroesophageal reflux disease)   . Hypertension   . Seasonal allergies   . Lower leg edema   . Hypothyroidism   . Restless leg syndrome   . DJD (degenerative joint disease)   . Diverticular disease   . Dysphagia   . Hiatal hernia   . PAF (paroxysmal atrial fibrillation)     s/p ablation done and also pill in the pocket flecainide  Past Surgical History  Procedure Laterality Date  . Rhinoplasty    . Abdominal hysterectomy    . Appendectomy    . Tubal ligation    . Eye surgery      cataracat  . Cardioversion      x3  . Breast surgery      left lumpectomy  . Tonsillectomy  1942  . Thyroidectomy  1973  . Bladder polyps    . Rotator cuff repair    . Esophagogastroduodenoscopy  03/03/2011    Procedure: ESOPHAGOGASTRODUODENOSCOPY (EGD);  Surgeon: Charolett Bumpers, MD;  Location: [facility];  Service: Endoscopy;  Laterality: N/A;  . Balloon dilation   03/03/2011    Procedure: BALLOON DILATION;  Surgeon: Charolett Bumpers, MD;  Location: [facility] ENDOSCOPY;  Service: Endoscopy;  Laterality: N/A;  . Atrial fibrillation ablation  09-06-13    2'14 -[facility]  . Cataract extraction Left   . Colonoscopy with propofol N/A 09/24/2013    Procedure: COLONOSCOPY WITH PROPOFOL;  Surgeon: Charolett Bumpers, MD;  Location: [facility] ENDOSCOPY;  Service: Endoscopy;  Laterality: N/A;  . Right heart catheterization N/A 07/11/2014    Procedure: RIGHT HEART CATH;  Surgeon: Laurey Morale, MD;  Location: [facility] CATH LAB;  Service: Cardiovascular;  Laterality: N/A;    Social History   Social History  . Marital Status: Married    Spouse Name: N/A  . Number of Children: N/A  . Years of Education: N/A   Occupational History  . retired    Social History Main Topics  . Smoking status: Never Smoker   . Smokeless tobacco: Never Used  . Alcohol Use: 0.0 oz/week    0 Standard drinks or equivalent per week     Comment: rare wine  . Drug Use: No  . Sexual Activity: No   Other Topics Concern  . Not on file   Social History Narrative    ROS: no fevers or chills, productive cough, hemoptysis, dysphasia, odynophagia, melena, hematochezia, dysuria, hematuria, rash, seizure activity, orthopnea, PND, pedal edema, claudication. Remaining systems are negative.  Physical Exam: Well-developed well-nourished in no acute distress.  Skin is warm and dry.  HEENT is normal.  Neck is supple.  Chest is clear to auscultation with normal expansion.  Cardiovascular exam is tachycardic and irregular Abdominal exam nontender or distended. No masses palpated. Extremities show no edema. neuro grossly intact  ECG atrial fibrillation with rapid ventricular response at 131. Cannot rule out prior septal infarct. Low voltage.

## 2014-12-30 ENCOUNTER — Other Ambulatory Visit: Payer: Self-pay

## 2014-12-30 ENCOUNTER — Ambulatory Visit (HOSPITAL_COMMUNITY): Payer: Medicare Other | Admitting: Anesthesiology

## 2014-12-30 ENCOUNTER — Ambulatory Visit (HOSPITAL_COMMUNITY)
Admission: RE | Admit: 2014-12-30 | Discharge: 2014-12-30 | Disposition: A | Discharge status: Home / Self Care | Payer: Medicare Other | Source: Ambulatory Visit | Attending: Cardiology | Admitting: Cardiology

## 2014-12-30 ENCOUNTER — Encounter (HOSPITAL_COMMUNITY)
Admission: RE | Disposition: A | Discharge status: Home / Self Care | Payer: Self-pay | Source: Ambulatory Visit | Attending: Cardiology

## 2014-12-30 ENCOUNTER — Encounter (HOSPITAL_COMMUNITY): Payer: Self-pay | Admitting: *Deleted

## 2014-12-30 DIAGNOSIS — Z7901 Long term (current) use of anticoagulants: Secondary | ICD-10-CM | POA: Diagnosis not present

## 2014-12-30 DIAGNOSIS — I4891 Unspecified atrial fibrillation: Secondary | ICD-10-CM | POA: Diagnosis not present

## 2014-12-30 DIAGNOSIS — E039 Hypothyroidism, unspecified: Secondary | ICD-10-CM | POA: Diagnosis not present

## 2014-12-30 DIAGNOSIS — R0600 Dyspnea, unspecified: Secondary | ICD-10-CM | POA: Diagnosis not present

## 2014-12-30 DIAGNOSIS — I480 Paroxysmal atrial fibrillation: Secondary | ICD-10-CM | POA: Diagnosis not present

## 2014-12-30 DIAGNOSIS — I10 Essential (primary) hypertension: Secondary | ICD-10-CM | POA: Diagnosis not present

## 2014-12-30 HISTORY — PX: CARDIOVERSION: SHX1299

## 2014-12-30 HISTORY — DX: Adverse effect of unspecified anesthetic, initial encounter: T41.45XA

## 2014-12-30 HISTORY — DX: Other complications of anesthesia, initial encounter: T88.59XA

## 2014-12-30 SURGERY — CARDIOVERSION
Anesthesia: Monitor Anesthesia Care

## 2014-12-30 MED ORDER — PROPOFOL 10 MG/ML IV BOLUS
INTRAVENOUS | Status: DC | PRN
  Administered 2014-12-30: 80 mg via INTRAVENOUS
  Administered 2014-12-30: 20 mg via INTRAVENOUS

## 2014-12-30 MED ORDER — SODIUM CHLORIDE 0.9 % IV SOLN
INTRAVENOUS | Status: DC | PRN
  Administered 2014-12-30: 12:00:00 via INTRAVENOUS

## 2014-12-30 MED ORDER — SODIUM CHLORIDE 0.9 % IV SOLN: INTRAVENOUS | Status: DC

## 2014-12-30 MED ORDER — LIDOCAINE HCL (CARDIAC) 20 MG/ML IV SOLN
INTRAVENOUS | Status: DC | PRN
  Administered 2014-12-30: 60 mg via INTRAVENOUS

## 2014-12-30 NOTE — H&P (Signed)
Natasha [name]  12/29/2014 3:15 PM  Office Visit  MRN:  [id number]   Description: Female DOB: 21-Jan-1933  Provider: Lewayne Bunting, MD  Department: [facility]       Vital Signs  Most recent update: 12/29/2014 3:25 PM by Christy Gentles, CMA    BP Pulse Ht Wt BMI    104/80 mmHg 131 5\' 9"  (1.753 m) 95.255 kg (210 lb) 31.00 kg/m2      Progress Notes      Lewayne Bunting, MD at 12/29/2014 3:24 PM     Status: Signed       Expand All Collapse All        HPI: follow-up atrial fibrillation. Patient is followed by Dr. Mayford Knife. She was seen in the Coumadin clinic today and noted to have recurrent atrial fibrillation with a rapid ventricular response and was added to my schedule. Patient has had 2 ablations at [facility]. Nuclear study September 2015 showed ejection fraction 76% and normal perfusion. Last echocardiogram December 2015 showed normal LV function, mild mitral regurgitation and tricuspid regurgitation. Mildly elevated pulmonary pressures. Patient had right heart catheterization in March 2016 which revealed a pulmonary capillary wedge pressure of 20 with preserved cardiac output. Pulmonary venous hypertension felt secondary to diastolic dysfunction.  Patient apparently developed atrial fibrillation 2 weeks ago. She notes associated weakness, dyspnea and fatigue. If her heart rate increases significantly she will feel chest tightness she contacted [facility] and was placed on multaq. However she did not convert and apparently the medication made her feel bad and didn't discontinue this medication. They told her not to take Cardizem as they felt her blood pressure was too low. When she is not in atrial fibrillation she denies dyspnea, chest pain or syncope.  Current Outpatient Prescriptions  Medication Sig Dispense Refill  . acetaminophen (TYLENOL) 500 MG tablet Take 500 mg by mouth every 6 (six) hours as needed for mild pain or headache.    .  butalbital-acetaminophen-caffeine (FIORICET WITH CODEINE) 50-325-40-30 MG per capsule Take 1 capsule by mouth every 4 (four) hours as needed. For migraines    . cholecalciferol (VITAMIN D) 1000 UNITS tablet Take 1,000 Units by mouth daily.    Marland Kitchen esomeprazole (NEXIUM) 20 MG capsule Take 20 mg by mouth every other day.     . estradiol (ESTRACE) 1 MG tablet Take 1 mg by mouth daily.    . famotidine (PEPCID AC) 10 MG chewable tablet Chew 10 mg by mouth 2 (two) times daily as needed for heartburn.     . furosemide (LASIX) 40 MG tablet Take 1.5 tablets (60 mg total) by mouth 2 (two) times daily. (Patient taking differently: Take 40 mg by mouth 2 (two) times daily. ) 90 tablet 6  . hydroxypropyl methylcellulose (ISOPTO TEARS) 2.5 % ophthalmic solution Place 1 drop into both eyes 3 (three) times daily as needed for dry eyes.    Marland Kitchen levothyroxine (SYNTHROID, LEVOTHROID) 125 MCG tablet Take 125 mcg by mouth daily before breakfast. BRAND ONLY    . LORazepam (ATIVAN) 1 MG tablet Take 0.5-1 mg by mouth at bedtime as needed. For sleep    . Magnesium 500 MG CAPS Take 500 mg by mouth daily.    . nebivolol (BYSTOLIC) 5 MG tablet Take 5 mg by mouth every evening.     . potassium chloride (K-DUR) 10 MEQ tablet Take 1 tablet (10 mEq total) by mouth 2 (two) times daily. 60 tablet 6  . warfarin (COUMADIN) 5 MG tablet Take  2.5-5 mg by mouth daily. 5 mg Sunday, Tuesday, Thursday, Saturday. 2.5mg  Monday, Wednesday and Friday     No current facility-administered medications for this visit.     Past Medical History  Diagnosis Date  . GERD (gastroesophageal reflux disease)   . Hypertension   . Seasonal allergies   . Lower leg edema   . Hypothyroidism   . Restless leg syndrome   . DJD (degenerative joint disease)   . Diverticular disease   . Dysphagia   . Hiatal hernia   . PAF (paroxysmal atrial fibrillation)      s/p ablation done and also pill in the pocket flecainide    Past Surgical History  Procedure Laterality Date  . Rhinoplasty    . Abdominal hysterectomy    . Appendectomy    . Tubal ligation    . Eye surgery      cataracat  . Cardioversion      x3  . Breast surgery      left lumpectomy  . Tonsillectomy  1942  . Thyroidectomy  1973  . Bladder polyps    . Rotator cuff repair    . Esophagogastroduodenoscopy  03/03/2011    Procedure: ESOPHAGOGASTRODUODENOSCOPY (EGD); Surgeon: Charolett Bumpers, MD; Location: [facility]; Service: Endoscopy; Laterality: N/A;  . Balloon dilation  03/03/2011    Procedure: BALLOON DILATION; Surgeon: Charolett Bumpers, MD; Location: [facility] END[facility]; Service: Endoscopy; Laterality: N/A;  . Atrial fibrillation ablation  09-06-13    2'14 -[facility]  . Cataract extraction Left   . Colonoscopy with propofol N/A 09/24/2013    Procedure: COLONOSCOPY WITH PROPOFOL; Surgeon: Charolett Bumpers, MD; Location: [facility]COPY; Service: Endoscopy; Laterality: N/A;  . Right heart catheterization N/A 07/11/2014    Procedure: RIGHT HEART CATH; Surgeon: Laurey Morale, MD; Location: [facility] CATH LAB; Service: Cardiovascular; Laterality: N/A;    Social History   Social History  . Marital Status: Married    Spouse Name: N/A  . Number of Children: N/A  . Years of Education: N/A   Occupational History  . retired    Social History Main Topics  . Smoking status: Never Smoker   . Smokeless tobacco: Never Used  . Alcohol Use: 0.0 oz/week    0 Standard drinks or equivalent per week     Comment: rare wine  . Drug Use: No  . Sexual Activity: No   Other Topics Concern  . Not on file   Social History Narrative    ROS: no fevers or chills, productive cough, hemoptysis, dysphasia, odynophagia, melena,  hematochezia, dysuria, hematuria, rash, seizure activity, orthopnea, PND, pedal edema, claudication. Remaining systems are negative.  Physical Exam: Well-developed well-nourished in no acute distress.  Skin is warm and dry.  HEENT is normal.  Neck is supple.  Chest is clear to auscultation with normal expansion.  Cardiovascular exam is tachycardic and irregular Abdominal exam nontender or distended. No masses palpated. Extremities show no edema. neuro grossly intact  ECG atrial fibrillation with rapid ventricular response at 131. Cannot rule out prior septal infarct. Low voltage.                AF (paroxysmal atrial fibrillation) - Lewayne Bunting, MD at 12/29/2014 5:49 PM     Status: Written Related Problem: AF (paroxysmal atrial fibrillation)   Expand All Collapse All   Patient has developed recurrent atrial fibrillation and she is extremely symptomatic. Her INRs have been therapeutic. I will arrange cardioversion tomorrow. I have asked her to take 30  mg of Cardizem this evening. She will continue Coumadin. We also discussed adding antiarrhythmics to prevent recurrences. However she has had multiple medication intolerances. She apparently felt poorly on flecanide and multaq. We discussed Tikosyn and amiodarone but she is concerned about potential side effects and does not want to consider these. Hopefully she will hold sinus rhythm following cardioversion. I will then ask her to follow-up with Dr. Mayford Knife and [facility] for further suggestions concerning management of her atrial fibrillation. She could also be seen in the atrial fibrillation clinic here in [place].            Hypertension - Lewayne Bunting, MD at 12/29/2014 5:49 PM     Status: Written Related Problem: Hypertension   Expand All Collapse All   Blood pressure controlled. Continue present medications.            Pulmonary hypertension - Lewayne Bunting, MD at 12/29/2014 5:50 PM      Status: Written Related Problem: Pulmonary hypertension   Expand All Collapse All   Mild and felt secondary to pulmonary venous hypertension.       For DCCV; no changes. [name]

## 2014-12-30 NOTE — Discharge Instructions (Signed)

## 2014-12-30 NOTE — Anesthesia Postprocedure Evaluation (Signed)
  Anesthesia Post-op Note  Patient: Natasha [name]  Procedure(s) Performed: Procedure(s): CARDIOVERSION (N/A)  Patient Location: Endoscopy Unit  Anesthesia Type:MAC  Level of Consciousness: awake, alert  and oriented  Airway and Oxygen Therapy: Patient Spontanous Breathing and Patient connected to nasal cannula oxygen  Post-op Pain: none  Post-op Assessment: Post-op Vital signs reviewed, Patient's Cardiovascular Status Stable and Respiratory Function Stable              Post-op Vital Signs: Reviewed and stable  Last Vitals:  Filed Vitals:   12/30/14 1120  BP: 134/71  Pulse: 102  Resp: 12    Complications: No apparent anesthesia complications

## 2014-12-30 NOTE — Anesthesia Preprocedure Evaluation (Addendum)
Anesthesia Evaluation  Patient identified by MRN, date of birth, ID band Patient awake    Reviewed: Allergy & Precautions, NPO status , Patient's Chart, lab work & pertinent test results  History of Anesthesia Complications (+) PONV and history of anesthetic complications  Airway Mallampati: II  TM Distance: >3 FB Neck ROM: Full    Dental  (+) Teeth Intact, Dental Advisory Given   Pulmonary shortness of breath,    Pulmonary exam normal        Cardiovascular hypertension, Pt. on medications and Pt. on home beta blockers Normal cardiovascular exam     Neuro/Psych    GI/Hepatic hiatal hernia, GERD  Medicated and Controlled,  Endo/Other  Hypothyroidism   Renal/GU      Musculoskeletal  (+) Arthritis ,   Abdominal   Peds  Hematology   Anesthesia Other Findings   Reproductive/Obstetrics                            Anesthesia Physical Anesthesia Plan  ASA: III  Anesthesia Plan: MAC   Post-op Pain Management:    Induction: Intravenous  Airway Management Planned: Mask  Additional Equipment:   Intra-op Plan:   Post-operative Plan:   Informed Consent: I have reviewed the patients History and Physical, chart, labs and discussed the procedure including the risks, benefits and alternatives for the proposed anesthesia with the patient or authorized representative who has indicated his/her understanding and acceptance.   Dental advisory given  Plan Discussed with: Surgeon and CRNA  Anesthesia Plan Comments:        Anesthesia Quick Evaluation

## 2014-12-30 NOTE — Procedures (Signed)
Electrical Cardioversion Procedure Note Natasha [name] [id number] 11-06-1932  Procedure: Electrical Cardioversion Indications:  Atrial Fibrillation  Procedure Details Consent: Risks of procedure as well as the alternatives and risks of each were explained to the (patient/caregiver).  Consent for procedure obtained. Time Out: Verified patient identification, verified procedure, site/side was marked, verified correct patient position, special equipment/implants available, medications/allergies/relevent history reviewed, required imaging and test results available.  Performed  Patient placed on cardiac monitor, pulse oximetry, supplemental oxygen as necessary.  Sedation given: Patient sedated by anesthesia with lidocaine 60 mg and diprovan 100 mg IV. Pacer pads placed anterior and posterior chest.  Cardioverted 3 time(s).  Cardioverted at 120J, 150J and 200J.  Evaluation Findings: Post procedure EKG shows: NSR Complications: None Patient did tolerate procedure well.   Natasha [name] 12/30/2014, 11:12 AM

## 2014-12-30 NOTE — Transfer of Care (Signed)
Immediate Anesthesia Transfer of Care Note  Patient: Natasha [name]  Procedure(s) Performed: Procedure(s): CARDIOVERSION (N/A)  Patient Location: Endoscopy Unit  Anesthesia Type:MAC  Level of Consciousness: awake, alert  and oriented  Airway & Oxygen Therapy: Patient Spontanous Breathing and Patient connected to nasal cannula oxygen  Post-op Assessment: Report given to RN and Post -op Vital signs reviewed and stable  Post vital signs: Reviewed and stable  Last Vitals:  Filed Vitals:   12/30/14 1120  BP: 134/71  Pulse: 102  Resp: 12    Complications: No apparent anesthesia complications

## 2014-12-31 ENCOUNTER — Encounter (HOSPITAL_COMMUNITY): Payer: Self-pay | Admitting: Cardiology

## 2015-01-01 DIAGNOSIS — I480 Paroxysmal atrial fibrillation: Secondary | ICD-10-CM | POA: Diagnosis not present

## 2015-01-08 NOTE — Addendum Note (Signed)
Addended by: Evans Lance on: 01/08/2015 05:57 PM   Modules accepted: Orders

## 2015-01-09 ENCOUNTER — Encounter (HOSPITAL_COMMUNITY): Payer: Self-pay | Admitting: Nurse Practitioner

## 2015-01-09 ENCOUNTER — Ambulatory Visit (HOSPITAL_COMMUNITY)
Admission: RE | Admit: 2015-01-09 | Discharge: 2015-01-09 | Disposition: A | Discharge status: Home / Self Care | Payer: Medicare Other | Source: Ambulatory Visit | Attending: Nurse Practitioner | Admitting: Nurse Practitioner

## 2015-01-09 ENCOUNTER — Telehealth: Payer: Self-pay | Admitting: Cardiology

## 2015-01-09 VITALS — BP 118/80 | HR 104 | Ht 69.0 in | Wt 209.6 lb

## 2015-01-09 DIAGNOSIS — I481 Persistent atrial fibrillation: Secondary | ICD-10-CM | POA: Insufficient documentation

## 2015-01-09 DIAGNOSIS — I4891 Unspecified atrial fibrillation: Secondary | ICD-10-CM

## 2015-01-09 MED ORDER — NEBIVOLOL HCL 5 MG PO TABS: ORAL_TABLET | ORAL | Status: DC

## 2015-01-09 NOTE — Telephone Encounter (Signed)
Pt in Afib since yesterday, BP low, Heart rate 134-was cardioverted last week   pls call [phone]

## 2015-01-09 NOTE — Progress Notes (Signed)
Patient ID: Natasha [name], female   DOB: 12-22-1932, 81 y.o.   MRN: [id number]     Primary Care Physician: Pearla Dubonnet, MD Referring Physician: Elesa Hacker [address] office triage Cardiologist: Dr. Verdene Rio is a 81 y.o. female with a h/o  persistent afib with ablation x2 at [facility] by Dr. Macon Large. She was seen by him in August and was in afib with RVR. She was offered a 3rd ablation which pt deferred so she was given multaq 400 mg bid to try. She took this and felt bad and was told to stop. She was then seen by Dr. Jens Som, being DOD, when she and her husband was in the coumadin clinic and she had afib with RVR. She was set up for cardioversion which unfortunately only lasted 7 days. She called the triage nurse this am to report afib with rvr and referred to the afib clinic for further evaluation.  Pt reports that she has failed flecainide, multaq and tikosyn. She states that she is not a candidate for amiodarone due to presence of hypertension. Her BP is soft at times and may be able to slightly tweak rate control but do not have a lot of room to do so . She was on digoxin at one time years ago. She is considering going back to Dr. Macon Large for a third ablation and will make appointment. Another option may be AV node ablation/PPM.  Today, she denies symptoms of palpitations, chest pain, shortness of breath, orthopnea, PND, lower extremity edema, dizziness, presyncope, syncope, or neurologic sequela. The patient is tolerating medications without difficulties and is otherwise without complaint today.   Past Medical History  Diagnosis Date  . GERD (gastroesophageal reflux disease)   . Hypertension   . Seasonal allergies   . Lower leg edema   . Hypothyroidism   . Restless leg syndrome   . DJD (degenerative joint disease)   . Diverticular disease   . Dysphagia   . Hiatal hernia   . PAF (paroxysmal atrial fibrillation)     s/p ablation done and also pill in the pocket  flecainide  . Complication of anesthesia   . PONV (postoperative nausea and vomiting)    Past Surgical History  Procedure Laterality Date  . Rhinoplasty    . Abdominal hysterectomy    . Appendectomy    . Tubal ligation    . Eye surgery      cataracat  . Cardioversion      x3  . Breast surgery      left lumpectomy  . Tonsillectomy  1942  . Thyroidectomy  1973  . Bladder polyps    . Rotator cuff repair    . Esophagogastroduodenoscopy  03/03/2011    Procedure: ESOPHAGOGASTRODUODENOSCOPY (EGD);  Surgeon: Charolett Bumpers, MD;  Location: [facility];  Service: Endoscopy;  Laterality: N/A;  . Balloon dilation  03/03/2011    Procedure: BALLOON DILATION;  Surgeon: Charolett Bumpers, MD;  Location: [facility] ENDOSCOPY;  Service: Endoscopy;  Laterality: N/A;  . Atrial fibrillation ablation  09-06-13    2'14 -[facility]  . Cataract extraction Left   . Colonoscopy with propofol N/A 09/24/2013    Procedure: COLONOSCOPY WITH PROPOFOL;  Surgeon: Charolett Bumpers, MD;  Location: [facility]COPY;  Service: Endoscopy;  Laterality: N/A;  . Right heart catheterization N/A 07/11/2014    Procedure: RIGHT HEART CATH;  Surgeon: Laurey Morale, MD;  Location: [facility] CATH LAB;  Service: Cardiovascular;  Laterality: N/A;  .  Cardioversion N/A 12/30/2014    Procedure: CARDIOVERSION;  Surgeon: Lewayne Bunting, MD;  Location: [facility] ENDOSCOPY;  Service: Cardiovascular;  Laterality: N/A;    Current Outpatient Prescriptions  Medication Sig Dispense Refill  . acetaminophen (TYLENOL) 500 MG tablet Take 500 mg by mouth every 6 (six) hours as needed for mild pain or headache.    . butalbital-acetaminophen-caffeine (FIORICET WITH CODEINE) 50-325-40-30 MG per capsule Take 1 capsule by mouth every 4 (four) hours as needed. For migraines    . cholecalciferol (VITAMIN D) 1000 UNITS tablet Take 1,000 Units by mouth daily.    Marland Kitchen esomeprazole (NEXIUM) 20 MG capsule Take 20 mg by mouth every other day.     . estradiol (ESTRACE) 1 MG  tablet Take 1 mg by mouth daily.    . famotidine (PEPCID AC) 10 MG chewable tablet Chew 10 mg by mouth 2 (two) times daily as needed for heartburn.     . furosemide (LASIX) 40 MG tablet Take 1.5 tablets (60 mg total) by mouth 2 (two) times daily. (Patient taking differently: Take 40 mg by mouth 2 (two) times daily. ) 90 tablet 6  . hydroxypropyl methylcellulose (ISOPTO TEARS) 2.5 % ophthalmic solution Place 1 drop into both eyes 3 (three) times daily as needed for dry eyes.    Marland Kitchen levothyroxine (SYNTHROID, LEVOTHROID) 125 MCG tablet Take 125 mcg by mouth daily before breakfast. BRAND ONLY    . LORazepam (ATIVAN) 1 MG tablet Take 0.5-1 mg by mouth at bedtime as needed. For sleep    . Magnesium 500 MG CAPS Take 500 mg by mouth daily.    . nebivolol (BYSTOLIC) 5 MG tablet Take 1/2 tablet (2.5mg ) in the AM and 1 tablet ( ) in the PM 45 tablet 3  . potassium chloride (K-DUR) 10 MEQ tablet Take 1 tablet (10 mEq total) by mouth 2 (two) times daily. 60 tablet 6  . warfarin (COUMADIN) 5 MG tablet Take 2.5-5 mg by mouth daily. 5 mg Sunday, Tuesday, Thursday, Saturday. 2.5mg  Monday, Wednesday and Friday     No current facility-administered medications for this encounter.    Allergies  Allergen Reactions  . Tramadol Nausea Only  . Codeine Nausea And Vomiting and Nausea Only  . Erythromycin Other (See Comments) and Hives    Flushing on face  . Other Other (See Comments)    ECG leads cause skin irritation.  Marland Kitchen Penicillins Hives  . Tetracyclines & Related Other (See Comments)    "Sores on legs"  . Diltiazem Rash and Other (See Comments)    Rash with long acting form  . Tetracycline Rash    Social History   Social History  . Marital Status: Married    Spouse Name: N/A  . Number of Children: N/A  . Years of Education: N/A   Occupational History  . retired    Social History Main Topics  . Smoking status: Never Smoker   . Smokeless tobacco: Never Used  . Alcohol Use: 0.0 oz/week    0  Standard drinks or equivalent per week     Comment: rare wine  . Drug Use: No  . Sexual Activity: No   Other Topics Concern  . Not on file   Social History Narrative    Family History  Problem Relation Age of Onset  . Anesthesia problems Neg Hx   . Hypotension Neg Hx   . Malignant hyperthermia Neg Hx   . Pseudochol deficiency Neg Hx   . Transient ischemic attack Mother   .  Heart failure Father   . CVA Father   . Lung cancer Father   . Hypertension Sister   . Cancer Brother   . Sudden death Brother   . Heart attack Neg Hx     ROS- All systems are reviewed and negative except as per the HPI above  Physical Exam: Filed Vitals:   01/09/15 1143 01/09/15 1415  BP: 118/80   Pulse: 123 104  Height: 5\' 9"  (1.753 m)   Weight: 209 lb 9.6 oz (95.074 kg)     GEN- The patient is well appearing, alert and oriented x 3 today.   Head- normocephalic, atraumatic Eyes-  Sclera clear, conjunctiva pink Ears- hearing intact Oropharynx- clear Neck- supple, no JVP Lymph- no cervical lymphadenopathy Lungs- Clear to ausculation bilaterally, normal work of breathing Heart- Irregular rate and rhythm, no murmurs, rubs or gallops, PMI not laterally displaced GI- soft, NT, ND, + BS Extremities- no clubbing, cyanosis, or edema MS- no significant deformity or atrophy Skin- no rash or lesion Psych- euthymic mood, full affect Neuro- strength and sensation are intact  EKG- Accelerated junctional rhythm vrs a atypical atrial flutter at 123 bpm, QRS 52 ms, QTc 460 ms Epic records reviewed  Assessment and Plan: 1. Persistent symptomatic afib with a complicated history and failure of antiarrythmics Only quick option I have today is to slightly increase Bystolic to 7.5 mg a day, 5 mg at hs and 2.5 mg in am so she can attempt better heart rate control. She does have 30 mg cardizem if needed, if she has a systolic BP over 559 mg, can use. If extra dose of bystolic lowers BP too much, go back to 5 mg  a day. She does not drink much water and was encouraged to increase  Po intake to help with BP. Encouraged to make f/u appointment with Dr. Macon Large for possible 3rd ablation Possibly, if 3rd ablation is not effective, consideration for AV nodal ablation and PPM ? Digoxin may be helpful for rate control without affecting BP  F/u with Dr Mayford Knife  9//29   Elvina Sidle. Matthew Folks [facility] [address] [place], [place] [address] [phone]

## 2015-01-09 NOTE — Telephone Encounter (Signed)
Called patient back about her message. Patient is complaining of low BP and elevated HR and feels like she is in A.Fib. Patient's BP and HR yesterday were BP 97/79 HR 115 and BP 98/82 HR 132, this morning BP 97/74 HR 113 and BP 129/72 HR 137.  Patient having trouble sleeping and feels very weak.  Patient stated she took some Diltiazem yesterday times three, that she was given to take as needed. Patient reported that this did not help. Consulted Dr. Myrtis Ser (DOD), he wants patient to be added to A. Fib clinic today. Called A. Fib clinic, they can see patient at 11:00. Called patient back about appointment time. Patient is not sure she can make it there, but will try. Patient given A. Fib clinic's number and code to get into garage. Encouraged patient to call their office if she doesn't think she will make it. Encouraged patient not to get too rushed, so she doesn't get out of breath. Patient stated her husband was down the road and would be home shortly to take her to her appointment.

## 2015-01-09 NOTE — Patient Instructions (Signed)
Your physician has recommended you make the following change in your medication:  1)increase bystolic to 2.5mg  in the morning and 5mg  in the evening

## 2015-01-14 NOTE — Progress Notes (Signed)
Cardiology Office Note   Date:  01/15/2015   ID:  OTTIS CRITCHER, DOB [birth year], MRN [id number]  PCP:  Pearla Dubonnet, MD    Chief Complaint  Patient presents with  . AF      History of Present Illness: Natasha [name] is a 81 y.o. female who presents for followup of afib.  She was seen in the Coumadin clinic 9/12 and noted to have recurrent atrial fibrillation with a rapid ventricular response and was seen by Dr. Jens Som. Patient has had 2 ablations at [facility]. Nuclear study September 2015 showed ejection fraction 76% and normal perfusion. Last echocardiogram December 2015 showed normal LV function, mild mitral regurgitation and tricuspid regurgitation. Mildly elevated pulmonary pressures. Patient had right heart catheterization in March 2016 which revealed a pulmonary capillary wedge pressure of 20 with preserved cardiac output. Pulmonary venous hypertension felt secondary to diastolic dysfunction.  Patient apparently developed atrial fibrillation several weeks ago. She noted associated weakness, dyspnea and fatigue. If her heart rate increases significantly she will feel chest tightness she contacted [facility] and was placed on multaq. However she did not convert and apparently the medication made her feel bad and didn't continue this medication. They told her not to take Cardizem as they felt her blood pressure was too low. When she is not in atrial fibrillation she denies dyspnea, chest pain or syncope.  She underwent DCCV on 9/13 to NSR and now presents for followup.  She was seen in afib clinic the other day and was in atrial flutter.  It was recommended that she make an appt at [facility] to consider 3rd ablation.  She denies any chest pain,  LE edema, dizziness or syncope.  She has noticed her heart racing which makes her feel tired and SOB.      Past Medical History  Diagnosis Date  . GERD (gastroesophageal reflux disease)   . Hypertension   .  Seasonal allergies   . Lower leg edema   . Hypothyroidism   . Restless leg syndrome   . DJD (degenerative joint disease)   . Diverticular disease   . Dysphagia   . Hiatal hernia   . PAF (paroxysmal atrial fibrillation)     s/p ablation done and also pill in the pocket flecainide  . Complication of anesthesia   . PONV (postoperative nausea and vomiting)     Past Surgical History  Procedure Laterality Date  . Rhinoplasty    . Abdominal hysterectomy    . Appendectomy    . Tubal ligation    . Eye surgery      cataracat  . Cardioversion      x3  . Breast surgery      left lumpectomy  . Tonsillectomy  1942  . Thyroidectomy  1973  . Bladder polyps    . Rotator cuff repair    . Esophagogastroduodenoscopy  03/03/2011    Procedure: ESOPHAGOGASTRODUODENOSCOPY (EGD);  Surgeon: Charolett Bumpers, MD;  Location: [facility];  Service: Endoscopy;  Laterality: N/A;  . Balloon dilation  03/03/2011    Procedure: BALLOON DILATION;  Surgeon: Charolett Bumpers, MD;  Location: [facility] ENDOSCOPY;  Service: Endoscopy;  Laterality: N/A;  . Atrial fibrillation ablation  09-06-13    2'14 -[facility]  . Cataract extraction Left   . Colonoscopy with propofol N/A 09/24/2013    Procedure: COLONOSCOPY WITH PROPOFOL;  Surgeon: Charolett Bumpers, MD;  Location: [facility] ENDOSCOPY;  Service: Endoscopy;  Laterality: N/A;  . Right heart catheterization N/A 07/11/2014    Procedure: RIGHT HEART CATH;  Surgeon: Laurey Morale, MD;  Location: [facility] CATH LAB;  Service: Cardiovascular;  Laterality: N/A;  . Cardioversion N/A 12/30/2014    Procedure: CARDIOVERSION;  Surgeon: Lewayne Bunting, MD;  Location: [facility] ENDOSCOPY;  Service: Cardiovascular;  Laterality: N/A;     Current Outpatient Prescriptions  Medication Sig Dispense Refill  . acetaminophen (TYLENOL) 500 MG tablet Take 500 mg by mouth every 6 (six) hours as needed for mild pain or headache.    . butalbital-acetaminophen-caffeine (FIORICET WITH CODEINE) 50-325-40-30 MG  per capsule Take 1 capsule by mouth every 4 (four) hours as needed. For migraines    . Calcium-Magnesium-Vitamin D (CITRACAL CALCIUM+D) 600-40-500 MG-MG-UNIT TB24 Take 1 tablet by mouth 2 (two) times daily.    . Cholecalciferol (VITAMIN D) 2000 UNITS CAPS Take 2,000 Units by mouth daily.    Marland Kitchen esomeprazole (NEXIUM) 20 MG capsule Take 20 mg by mouth every other day.     . estradiol (ESTRACE) 1 MG tablet Take 1 mg by mouth daily.    . famotidine (PEPCID AC) 10 MG chewable tablet Chew 10 mg by mouth 2 (two) times daily as needed for heartburn.     . furosemide (LASIX) 40 MG tablet Take 40 mg by mouth daily.    . hydroxypropyl methylcellulose (ISOPTO TEARS) 2.5 % ophthalmic solution Place 1 drop into both eyes 3 (three) times daily as needed for dry eyes.    Marland Kitchen levothyroxine (SYNTHROID, LEVOTHROID) 125 MCG tablet Take 125 mcg by mouth daily before breakfast. BRAND ONLY    . LORazepam (ATIVAN) 1 MG tablet Take 0.5-1 mg by mouth at bedtime as needed. For sleep    . Magnesium 500 MG CAPS Take 500 mg by mouth daily.    . nebivolol (BYSTOLIC) 5 MG tablet Take 1/2 tablet (2.5mg ) in the AM and 1 tablet ( ) in the PM 45 tablet 3  . potassium chloride (K-DUR) 10 MEQ tablet Take 1 tablet (10 mEq total) by mouth 2 (two) times daily. 60 tablet 6  . warfarin (COUMADIN) 5 MG tablet Take 2.5-5 mg by mouth daily. 5 mg Sunday, Tuesday, Thursday, Saturday. 2.5mg  Monday, Wednesday and Friday     No current facility-administered medications for this visit.    Allergies:   Tramadol; Codeine; Erythromycin; Other; Penicillins; Tetracyclines & related; Diltiazem; and Tetracycline    Social History:  The patient  reports that she has never smoked. She has never used smokeless tobacco. She reports that she drinks alcohol. She reports that she does not use illicit drugs.   Family History:  The patient's family history includes CVA in her father; Cancer in her brother; Heart failure in her father; Hypertension in her sister;  Lung cancer in her father; Sudden death in her brother; Transient ischemic attack in her mother. There is no history of Anesthesia problems, Hypotension, Malignant hyperthermia, Pseudochol deficiency, or Heart attack.    ROS:  Please see the history of present illness.   Otherwise, review of systems are positive for none.   All other systems are reviewed and negative.    PHYSICAL EXAM: VS:  BP 105/76 mmHg  Pulse 69  Ht  (1.753 m)  Wt 211 lb 12.8 oz (96.072 kg)  BMI 31.26 kg/m2  SpO2 97% , BMI Body mass index is 31.26 kg/(m^2). GEN: Well nourished, well developed, in no acute distress HEENT: normal Neck: no JVD,  carotid bruits, or masses Cardiac: irregularly irregular; no murmurs, rubs, or gallops,no edema  Respiratory:  clear to auscultation bilaterally, normal work of breathing GI: soft, nontender, nondistended, + BS MS: no deformity or atrophy Skin: warm and dry, no rash Neuro:  Strength and sensation are intact Psych: euthymic mood, full affect   EKG:  EKG was ordered today showed atrial flutter with variable block at 107bpm    Recent Labs: 08/04/2014: Magnesium 2.1; Pro B Natriuretic peptide (BNP) 100.0 12/29/2014: BUN 13; Creat 0.82; Hemoglobin 14.7; Platelets 236; Potassium 4.5; Sodium 140    Lipid Panel No results found for: CHOL, TRIG, HDL, CHOLHDL, VLDL, LDLCALC, LDLDIRECT    Wt Readings from Last 3 Encounters:  01/15/15 211 lb 12.8 oz (96.072 kg)  01/09/15 209 lb 9.6 oz (95.074 kg)  12/29/14 210 lb (95.255 kg)    ASSESSMENT AND PLAN:  Pulmonary hypertension As noted, she has mild Pulmonary HTN - primarily pulmonary venous HTN from elevated LA pressures (poss from diastolic dysfunction).   Chronic Diastolic CHF - appears euvolemic Continue Lasix. Last BMET was fine.    AF (paroxysmal atrial fibrillation) s/p recent DCCV to NSR but back in atrial flutter and Bystolic recently increased in afib clinic.  She has failed medical therapy with Amiodarone,  Multaq, Tikosyn and Flecainide. She has an appt with her cardiologist at [facility] for evaluation of possible 3rd ablation vs. PPM and AVN ablation.  She does not tolerate it when her heart goes fast.  Cannot increase BB further due to soft BP.  Will add digoxin 0.125mg  daily for better rate control.  Recheck 2D echo to make sure she has not developed a tachycardia induced CM.  We had a long conversation on the options for treatment going forward and await further recs from [facility].  Essential hypertension Controlled.    Current medicines are reviewed at length with the patient today.  The patient does not have concerns regarding medicines.  The following changes have been made:  no change  Labs/ tests ordered today: See above Assessment and Plan No orders of the defined types were placed in this encounter.     Disposition:   FU with me in 6 months and extender in 1 month  Signed, Quintella Reichert, MD  01/15/2015 9:03 AM    [facility] [address] [place], [place], [place]  [address] Phone: [phone]; Fax: [phone]

## 2015-01-15 ENCOUNTER — Encounter: Payer: Self-pay | Admitting: Cardiology

## 2015-01-15 ENCOUNTER — Ambulatory Visit (INDEPENDENT_AMBULATORY_CARE_PROVIDER_SITE_OTHER): Payer: Medicare Other | Admitting: Cardiology

## 2015-01-15 VITALS — BP 105/76 | HR 107 | Ht 69.0 in | Wt 211.8 lb

## 2015-01-15 DIAGNOSIS — I270 Primary pulmonary hypertension: Secondary | ICD-10-CM | POA: Diagnosis not present

## 2015-01-15 DIAGNOSIS — I272 Pulmonary hypertension, unspecified: Secondary | ICD-10-CM

## 2015-01-15 DIAGNOSIS — I480 Paroxysmal atrial fibrillation: Secondary | ICD-10-CM

## 2015-01-15 DIAGNOSIS — I10 Essential (primary) hypertension: Secondary | ICD-10-CM

## 2015-01-15 MED ORDER — DIGOXIN 125 MCG PO TABS: 0.1250 mg | ORAL_TABLET | Freq: Every day | ORAL | Status: DC

## 2015-01-15 NOTE — Patient Instructions (Signed)
Medication Instructions:  Your physician has recommended you make the following change in your medication:  1) START DIGOXIN 0.125 mg daily  Labwork: None  Testing/Procedures: Your physician has requested that you have an echocardiogram. Echocardiography is a painless test that uses sound waves to create images of your heart. It provides your doctor with information about the size and shape of your heart and how well your heart's chambers and valves are working. This procedure takes approximately one hour. There are no restrictions for this procedure.  Follow-Up: Your physician recommends that you schedule a follow-up appointment in ONE MONTH with a PA or NP.  Your physician wants you to follow-up in: 6 months with Dr. Mayford Knife. You will receive a reminder letter in the mail two months in advance. If you don't receive a letter, please call our office to schedule the follow-up appointment.  Any Other Special Instructions Will Be Listed Below (If Applicable).

## 2015-01-19 ENCOUNTER — Other Ambulatory Visit: Payer: Self-pay

## 2015-01-19 ENCOUNTER — Ambulatory Visit (HOSPITAL_COMMUNITY): Payer: Medicare Other | Attending: Cardiology

## 2015-01-19 DIAGNOSIS — I480 Paroxysmal atrial fibrillation: Secondary | ICD-10-CM

## 2015-01-19 DIAGNOSIS — I071 Rheumatic tricuspid insufficiency: Secondary | ICD-10-CM | POA: Insufficient documentation

## 2015-01-19 DIAGNOSIS — Z8249 Family history of ischemic heart disease and other diseases of the circulatory system: Secondary | ICD-10-CM | POA: Diagnosis not present

## 2015-01-19 DIAGNOSIS — I4891 Unspecified atrial fibrillation: Secondary | ICD-10-CM | POA: Diagnosis not present

## 2015-01-19 DIAGNOSIS — I272 Other secondary pulmonary hypertension: Secondary | ICD-10-CM

## 2015-01-19 DIAGNOSIS — I371 Nonrheumatic pulmonary valve insufficiency: Secondary | ICD-10-CM | POA: Insufficient documentation

## 2015-01-19 DIAGNOSIS — I10 Essential (primary) hypertension: Secondary | ICD-10-CM | POA: Insufficient documentation

## 2015-01-20 ENCOUNTER — Telehealth: Payer: Self-pay | Admitting: Cardiology

## 2015-01-20 NOTE — Telephone Encounter (Signed)
Informed patient of results and verbal understanding expressed.  

## 2015-01-20 NOTE — Telephone Encounter (Signed)
-----   Message from Quintella Reichert, MD sent at 01/20/2015  8:37 AM EDT ----- Echo showed normal LVF with mild TR and PR

## 2015-01-20 NOTE — Telephone Encounter (Signed)
F/u   Pt returning katy's phone call

## 2015-01-26 DIAGNOSIS — I480 Paroxysmal atrial fibrillation: Secondary | ICD-10-CM | POA: Diagnosis not present

## 2015-01-26 DIAGNOSIS — I471 Supraventricular tachycardia: Secondary | ICD-10-CM | POA: Diagnosis not present

## 2015-01-26 DIAGNOSIS — R001 Bradycardia, unspecified: Secondary | ICD-10-CM | POA: Diagnosis not present

## 2015-01-26 DIAGNOSIS — I4719 Other supraventricular tachycardia: Secondary | ICD-10-CM | POA: Insufficient documentation

## 2015-02-02 ENCOUNTER — Telehealth: Payer: Self-pay | Admitting: Cardiology

## 2015-02-02 NOTE — Telephone Encounter (Signed)
**Note De-Identified Natasha [name] Obfuscation** At the pts last OV with Dr Mayford Knife on 9/29 the pt was advised to start taking Digoxin 0.125 mg daily. She states she felt great until last Thursday (01/29/15) when she states that she was "just sitting there and noticed that her HR was elevated". She checked her HR and it was 132 bpm. She reports that since then she has been going in and out of rhythm with her HR ranging from the 80's to the 130's.   She states that she is suppose to have either an ablation or a pacemaker insertion at [facility] if she does not stay in rhythm. She does not want to do either until after the election as she is very busy until then.   She wants to know what Dr Mayford Knife recommends that she do concerning the following: 1. Should she continue to take Digoxin and if so will the dose need to be adjusted? 2. Does Dr Mayford Knife recommend that she have an ablation or should she have a pacemaker insertion at [facility]?  She states that she will be home until 11:30 this morning and will return this afternoon at 3 pm.  She is aware that I am forwarding this message to Dr Mayford Knife.

## 2015-02-02 NOTE — Telephone Encounter (Signed)
**Note De-Identified Costella Schwarz Obfuscation** I left a detailed message on the pts VM advising her to contact her EP MD at [facility] concerning her questions and concerns. Also, I left our phone number on the VM message if she has any questions.

## 2015-02-02 NOTE — Telephone Encounter (Signed)
I would like her to discuss this with her EP MD at [facility]

## 2015-02-02 NOTE — Telephone Encounter (Signed)
Pt c/o medication issue:  1. Name of Medication: Digoxin  2. How are you currently taking this medication (dosage and times per day)? once a day  3. Are you having a reaction (difficulty breathing--STAT)? No  4. What is your medication issue? Pt stated she is still having her heart to race and out of rhythm. Please call pt.

## 2015-02-09 ENCOUNTER — Ambulatory Visit (INDEPENDENT_AMBULATORY_CARE_PROVIDER_SITE_OTHER): Payer: Medicare Other | Admitting: Pharmacist Clinician (PhC)/ Clinical Pharmacy Specialist

## 2015-02-09 ENCOUNTER — Other Ambulatory Visit: Payer: Self-pay | Admitting: Cardiology

## 2015-02-09 DIAGNOSIS — I4891 Unspecified atrial fibrillation: Secondary | ICD-10-CM

## 2015-02-09 DIAGNOSIS — Z7901 Long term (current) use of anticoagulants: Secondary | ICD-10-CM

## 2015-02-09 LAB — CBC
HCT: 42 % (ref 36.0–46.0)
Hemoglobin: 14.4 g/dL (ref 12.0–15.0)
MCH: 32.8 pg (ref 26.0–34.0)
MCHC: 34.3 g/dL (ref 30.0–36.0)
MCV: 95.7 fL (ref 78.0–100.0)
MPV: 10.4 fL (ref 8.6–12.4)
PLATELETS: 257 10*3/uL (ref 150–400)
RBC: 4.39 MIL/uL (ref 3.87–5.11)
RDW: 13.2 % (ref 11.5–15.5)
WBC: 5.8 10*3/uL (ref 4.0–10.5)

## 2015-02-09 LAB — POCT INR: INR: 3.8

## 2015-02-10 ENCOUNTER — Telehealth: Payer: Self-pay | Admitting: Pharmacist Clinician (PhC)/ Clinical Pharmacy Specialist

## 2015-02-10 NOTE — Telephone Encounter (Signed)
Patient was in [facility] yesterday for INR.  Reported tarry, dark stools x 2 days.  INR elevated at 3.8

## 2015-02-10 NOTE — Telephone Encounter (Signed)
Sent patient to lab for CBC.  Hg/HCT came back WNL.  Reviewed with Dr. Mayford Knife and will have patient call PCP for follow up today or tomorrow.  Called patient and explained lab results and need to follow up with PCP.  Pt voiced understanding.  States stool was more toward "normal color" today, but will call PCP

## 2015-02-17 ENCOUNTER — Telehealth: Payer: Self-pay | Admitting: Cardiology

## 2015-02-17 NOTE — Telephone Encounter (Signed)
Patient st for the last few weeks she has felt "flushed" intermittently. She st her neck and upper chest gets "blood red and hot."  She blames her digoxin for the red discoloration. She also says it itches sometimes.  The patient denies CP, SOB, swelling. This AM, her BP was 98/50s. She said her HR read 146 but it has "since calmed down." She does not have an updated HR. Patient takes medication at lunchtime. Instructed patient not to take medication until she hears from our office.

## 2015-02-17 NOTE — Telephone Encounter (Signed)
She needs to come in for an EKG

## 2015-02-17 NOTE — Telephone Encounter (Signed)
Pt c/o medication issue:  1. Name of Medication: Digoxin 125mg   2. How are you currently taking this medication (dosage and times per day)? Only take it at lunch  3. Are you having a reaction (difficulty breathing--STAT)? Neck and upper chest is red.  4. What is your medication issue? Pt having an reaction from meds

## 2015-02-18 ENCOUNTER — Ambulatory Visit: Payer: Medicare Other | Admitting: Physician Assistant

## 2015-02-18 NOTE — Telephone Encounter (Signed)
Scheduled patient for EKG tomorrow at 1100.

## 2015-02-19 ENCOUNTER — Ambulatory Visit (INDEPENDENT_AMBULATORY_CARE_PROVIDER_SITE_OTHER): Payer: Medicare Other

## 2015-02-19 VITALS — BP 122/70 | HR 74 | Ht 69.0 in | Wt 215.0 lb

## 2015-02-19 DIAGNOSIS — I4891 Unspecified atrial fibrillation: Secondary | ICD-10-CM | POA: Diagnosis not present

## 2015-02-19 NOTE — Progress Notes (Signed)
Patient in for EKG today after stopping digoxin 0.125 mg daily 2 days ago on her own volition. She assumed the medication was causing redness and inflammation on her neck and upper chest. She took Benadryl 4 times yesterday and the redness improved. She also stopped taking her furosemide and her potassium 2 days ago as well. One of her friends at work told her that she had an acquired allergic reaction to Lasix and the patient thought she might be having one too. Today, the patient has no complaints/symptoms other than "her legs seem tired."   VS today:  BP 122/70 HR 74  EKG given to Dr. Eldridge Dace for review. Patient is in NSR with first degree HB. No new orders given. Escorted patient to waiting room and informed her she will be called with further instruction from Dr. Mayford Knife.

## 2015-02-23 ENCOUNTER — Ambulatory Visit (INDEPENDENT_AMBULATORY_CARE_PROVIDER_SITE_OTHER): Payer: Medicare Other | Admitting: Pharmacist Clinician (PhC)/ Clinical Pharmacy Specialist

## 2015-02-23 DIAGNOSIS — I4891 Unspecified atrial fibrillation: Secondary | ICD-10-CM | POA: Diagnosis not present

## 2015-02-23 LAB — POCT INR: INR: 2.3

## 2015-02-25 ENCOUNTER — Telehealth: Payer: Self-pay

## 2015-02-25 NOTE — Telephone Encounter (Signed)
-----   Message from Quintella Reichert, MD sent at 02/19/2015  3:15 PM EDT ----- Stay off dig and restart lasix and call if she has further rash  Traci ----- Message -----    From: Henrietta Dine, RN    Sent: 02/19/2015   3:09 PM      To: Quintella Reichert, MD  Kennon Rounds said the likelihood of her allergy is very rare. Even then she said the allergy would happen if she was also allergic to sulfa drugs, which the patient is not.  ----- Message -----    From: Quintella Reichert, MD    Sent: 02/19/2015  12:01 PM      To: Henrietta Dine, RN  Please  Check with Audrie Lia PharmD what liklihood is that her symptoms are due to acquiring a Lasix allergy ----- Message -----    From: Henrietta Dine, RN    Sent: 02/19/2015  11:53 AM      To: Quintella Reichert, MD

## 2015-02-25 NOTE — Telephone Encounter (Signed)
Instructed patient to stay off digoxin and restart lasix and to call if she has further rash. Patient agrees with treatment plan.

## 2015-03-05 ENCOUNTER — Ambulatory Visit: Payer: Medicare Other | Admitting: Physician Assistant

## 2015-03-06 ENCOUNTER — Ambulatory Visit (INDEPENDENT_AMBULATORY_CARE_PROVIDER_SITE_OTHER)
Admission: RE | Admit: 2015-03-06 | Discharge: 2015-03-06 | Disposition: A | Discharge status: Home / Self Care | Payer: Medicare Other | Source: Ambulatory Visit | Attending: Internal Medicine | Admitting: Internal Medicine

## 2015-03-06 DIAGNOSIS — R911 Solitary pulmonary nodule: Secondary | ICD-10-CM | POA: Diagnosis not present

## 2015-03-06 DIAGNOSIS — R918 Other nonspecific abnormal finding of lung field: Secondary | ICD-10-CM | POA: Diagnosis not present

## 2015-03-09 ENCOUNTER — Telehealth: Payer: Self-pay | Admitting: Internal Medicine

## 2015-03-09 DIAGNOSIS — R911 Solitary pulmonary nodule: Secondary | ICD-10-CM

## 2015-03-09 NOTE — Telephone Encounter (Signed)
Patient calling for CT results, may be reached at [phone]

## 2015-03-09 NOTE — Telephone Encounter (Signed)
  IMPRESSION: Multiple pulmonary nodules. The largest of which lies in the right upper lobe and is stable from the prior study. Followup examination in 1 year is recommended to assess for stability.  The remainder of the exam is stable from the prior study.   Electronically Signed By: Alcide Clever M.D. On: 03/06/2015 15:17               Natasha [name]  Pulmonary nodules - are stable. Order NEXT CT CHEST WO CONTRAST in 1 year  Dr. Kalman Shan, M.D., [facility].C.P Pulmonary and Critical Care Medicine Staff Physician [facility] Pager: [phone], If no answer or between  15:00h - 7:00h: call [phone]  03/09/2015 11:25 AM

## 2015-03-09 NOTE — Telephone Encounter (Signed)
Spoke with pt, aware of results/recs.  Ct ordered for 1 year out.  Nothing further needed.

## 2015-03-11 ENCOUNTER — Encounter: Payer: Self-pay | Admitting: Physician Assistant

## 2015-03-16 DIAGNOSIS — I480 Paroxysmal atrial fibrillation: Secondary | ICD-10-CM | POA: Diagnosis not present

## 2015-03-23 ENCOUNTER — Ambulatory Visit (INDEPENDENT_AMBULATORY_CARE_PROVIDER_SITE_OTHER): Payer: Medicare Other | Admitting: Pharmacist Clinician (PhC)/ Clinical Pharmacy Specialist

## 2015-03-23 DIAGNOSIS — I4891 Unspecified atrial fibrillation: Secondary | ICD-10-CM

## 2015-03-23 LAB — POCT INR: INR: 2

## 2015-03-26 ENCOUNTER — Other Ambulatory Visit: Payer: Self-pay | Admitting: Physician Assistant

## 2015-04-09 ENCOUNTER — Ambulatory Visit: Payer: Medicare Other | Admitting: Internal Medicine

## 2015-04-22 ENCOUNTER — Encounter (HOSPITAL_COMMUNITY): Payer: Self-pay | Admitting: Cardiology

## 2015-04-22 ENCOUNTER — Encounter: Payer: Self-pay | Admitting: Cardiology

## 2015-04-22 ENCOUNTER — Ambulatory Visit (INDEPENDENT_AMBULATORY_CARE_PROVIDER_SITE_OTHER): Payer: Medicare Other | Admitting: Cardiology

## 2015-04-22 ENCOUNTER — Observation Stay (HOSPITAL_COMMUNITY)
Admission: AD | Admit: 2015-04-22 | Discharge: 2015-04-23 | Disposition: A | Discharge status: Home / Self Care | Payer: Medicare Other | Source: Ambulatory Visit | Attending: Internal Medicine | Admitting: Internal Medicine

## 2015-04-22 VITALS — BP 100/62 | Ht 69.0 in | Wt 209.0 lb

## 2015-04-22 DIAGNOSIS — I10 Essential (primary) hypertension: Secondary | ICD-10-CM | POA: Diagnosis not present

## 2015-04-22 DIAGNOSIS — I4891 Unspecified atrial fibrillation: Secondary | ICD-10-CM | POA: Diagnosis not present

## 2015-04-22 DIAGNOSIS — I5032 Chronic diastolic (congestive) heart failure: Secondary | ICD-10-CM | POA: Diagnosis present

## 2015-04-22 DIAGNOSIS — G2581 Restless legs syndrome: Secondary | ICD-10-CM | POA: Diagnosis not present

## 2015-04-22 DIAGNOSIS — Z6830 Body mass index (BMI) 30.0-30.9, adult: Secondary | ICD-10-CM | POA: Insufficient documentation

## 2015-04-22 DIAGNOSIS — I081 Rheumatic disorders of both mitral and tricuspid valves: Secondary | ICD-10-CM | POA: Diagnosis not present

## 2015-04-22 DIAGNOSIS — Z79899 Other long term (current) drug therapy: Secondary | ICD-10-CM | POA: Insufficient documentation

## 2015-04-22 DIAGNOSIS — I272 Other secondary pulmonary hypertension: Secondary | ICD-10-CM | POA: Diagnosis not present

## 2015-04-22 DIAGNOSIS — K219 Gastro-esophageal reflux disease without esophagitis: Secondary | ICD-10-CM | POA: Diagnosis not present

## 2015-04-22 DIAGNOSIS — Z880 Allergy status to penicillin: Secondary | ICD-10-CM | POA: Diagnosis not present

## 2015-04-22 DIAGNOSIS — Z7901 Long term (current) use of anticoagulants: Secondary | ICD-10-CM | POA: Insufficient documentation

## 2015-04-22 DIAGNOSIS — R0600 Dyspnea, unspecified: Secondary | ICD-10-CM | POA: Diagnosis not present

## 2015-04-22 DIAGNOSIS — E039 Hypothyroidism, unspecified: Secondary | ICD-10-CM | POA: Diagnosis not present

## 2015-04-22 DIAGNOSIS — I480 Paroxysmal atrial fibrillation: Secondary | ICD-10-CM

## 2015-04-22 DIAGNOSIS — I4892 Unspecified atrial flutter: Principal | ICD-10-CM

## 2015-04-22 HISTORY — DX: Chronic diastolic (congestive) heart failure: I50.32

## 2015-04-22 LAB — BASIC METABOLIC PANEL
Anion gap: 10 (ref 5–15)
BUN: 14 mg/dL (ref 6–20)
CALCIUM: 9 mg/dL (ref 8.9–10.3)
CO2: 24 mmol/L (ref 22–32)
Chloride: 102 mmol/L (ref 101–111)
Creatinine, Ser: 0.84 mg/dL (ref 0.44–1.00)
GFR calc Af Amer: >60 mL/min (ref >60)
GLUCOSE: 108 mg/dL — AB (ref 65–99)
POTASSIUM: 4.7 mmol/L (ref 3.5–5.1)
Sodium: 136 mmol/L (ref 135–145)

## 2015-04-22 LAB — CBC WITH DIFFERENTIAL/PLATELET
BASOS PCT: 1 %
Basophils Absolute: 0 10*3/uL (ref 0.0–0.1)
Eosinophils Absolute: 0.1 10*3/uL (ref 0.0–0.7)
Eosinophils Relative: 1 %
HEMATOCRIT: 42.3 % (ref 36.0–46.0)
Hemoglobin: 14.9 g/dL (ref 12.0–15.0)
Lymphocytes Relative: 33 %
Lymphs Abs: 2 10*3/uL (ref 0.7–4.0)
MCH: 33.3 pg (ref 26.0–34.0)
MCHC: 35.2 g/dL (ref 30.0–36.0)
MCV: 94.4 fL (ref 78.0–100.0)
MONO ABS: 0.7 10*3/uL (ref 0.1–1.0)
MONOS PCT: 11 %
NEUTROS ABS: 3.3 10*3/uL (ref 1.7–7.7)
Neutrophils Relative %: 54 %
Platelets: 253 10*3/uL (ref 150–400)
RBC: 4.48 MIL/uL (ref 3.87–5.11)
RDW: 13.2 % (ref 11.5–15.5)
WBC: 6.1 10*3/uL (ref 4.0–10.5)

## 2015-04-22 LAB — TSH: TSH: 1.548 u[IU]/mL (ref 0.350–4.500)

## 2015-04-22 LAB — POCT INR: INR: 2.6

## 2015-04-22 LAB — MAGNESIUM: Magnesium: 2.1 mg/dL (ref 1.7–2.4)

## 2015-04-22 MED ORDER — POTASSIUM CHLORIDE CRYS ER 10 MEQ PO TBCR: 10.0000 meq | EXTENDED_RELEASE_TABLET | Freq: Once | ORAL | Status: DC

## 2015-04-22 MED ORDER — NITROGLYCERIN 0.4 MG SL SUBL: 0.4000 mg | SUBLINGUAL_TABLET | SUBLINGUAL | Status: DC | PRN

## 2015-04-22 MED ORDER — SODIUM CHLORIDE 0.9 % IV BOLUS (SEPSIS)
250.0000 mL | Freq: Once | INTRAVENOUS | Status: AC
  Administered 2015-04-22: 250 mL via INTRAVENOUS

## 2015-04-22 MED ORDER — FUROSEMIDE 40 MG PO TABS
40.0000 mg | ORAL_TABLET | Freq: Two times a day (BID) | ORAL | Status: DC
  Administered 2015-04-22 – 2015-04-23 (×2): 40 mg via ORAL
  Filled 2015-04-22 (×2): qty 1

## 2015-04-22 MED ORDER — METOPROLOL TARTRATE 1 MG/ML IV SOLN
5.0000 mg | INTRAVENOUS | Status: DC
  Administered 2015-04-22 – 2015-04-23 (×3): 5 mg via INTRAVENOUS
  Filled 2015-04-22 (×4): qty 5

## 2015-04-22 MED ORDER — METOPROLOL TARTRATE 1 MG/ML IV SOLN
5.0000 mg | INTRAVENOUS | Status: DC
  Administered 2015-04-22: 5 mg via INTRAVENOUS
  Filled 2015-04-22: qty 5

## 2015-04-22 MED ORDER — DILTIAZEM HCL 25 MG/5ML IV SOLN
10.0000 mg | Freq: Once | INTRAVENOUS | Status: AC
  Administered 2015-04-22: 10 mg via INTRAVENOUS
  Filled 2015-04-22: qty 5

## 2015-04-22 MED ORDER — ESTRADIOL 1 MG PO TABS
1.0000 mg | ORAL_TABLET | Freq: Every day | ORAL | Status: DC
  Administered 2015-04-22: 1 mg via ORAL
  Filled 2015-04-22 (×2): qty 1

## 2015-04-22 MED ORDER — WARFARIN - PHARMACIST DOSING INPATIENT: Freq: Every day | Status: DC

## 2015-04-22 MED ORDER — WARFARIN SODIUM 5 MG PO TABS: 5.0000 mg | ORAL_TABLET | ORAL | Status: DC

## 2015-04-22 MED ORDER — ACETAMINOPHEN 325 MG PO TABS
650.0000 mg | ORAL_TABLET | ORAL | Status: DC | PRN
  Administered 2015-04-23: 650 mg via ORAL
  Filled 2015-04-22: qty 2

## 2015-04-22 MED ORDER — NEBIVOLOL HCL 5 MG PO TABS: 5.0000 mg | ORAL_TABLET | Freq: Two times a day (BID) | ORAL | Status: DC

## 2015-04-22 MED ORDER — WARFARIN SODIUM 2.5 MG PO TABS
2.5000 mg | ORAL_TABLET | ORAL | Status: DC
  Administered 2015-04-22: 2.5 mg via ORAL
  Filled 2015-04-22: qty 1

## 2015-04-22 MED ORDER — LEVOTHYROXINE SODIUM 25 MCG PO TABS
125.0000 ug | ORAL_TABLET | Freq: Every day | ORAL | Status: DC
  Administered 2015-04-23: 125 ug via ORAL
  Filled 2015-04-22: qty 1

## 2015-04-22 MED ORDER — WARFARIN SODIUM 2.5 MG PO TABS: 2.5000 mg | ORAL_TABLET | Freq: Every day | ORAL | Status: DC

## 2015-04-22 MED ORDER — LORAZEPAM 0.5 MG PO TABS
0.5000 mg | ORAL_TABLET | Freq: Every evening | ORAL | Status: DC | PRN
  Administered 2015-04-22: 0.5 mg via ORAL
  Filled 2015-04-22: qty 1

## 2015-04-22 MED ORDER — MAGNESIUM OXIDE 400 (241.3 MG) MG PO TABS
400.0000 mg | ORAL_TABLET | Freq: Every day | ORAL | Status: DC
  Administered 2015-04-23: 400 mg via ORAL
  Filled 2015-04-22: qty 1

## 2015-04-22 MED ORDER — ONDANSETRON HCL 4 MG/2ML IJ SOLN: 4.0000 mg | Freq: Four times a day (QID) | INTRAMUSCULAR | Status: DC | PRN

## 2015-04-22 NOTE — Patient Instructions (Signed)
You are being admitted to [facility] for further treatment.  Please go to the [facility] for your room assignment.

## 2015-04-22 NOTE — Progress Notes (Signed)
ANTICOAGULATION CONSULT NOTE - Initial Consult  Pharmacy Consult for Coumadin Indication: atrial fibrillation  Allergies  Allergen Reactions  . Tramadol Nausea Only  . Codeine Nausea And Vomiting and Nausea Only  . Digoxin And Related     ' Makes my face blood red"  . Erythromycin Other (See Comments) and Hives    Flushing on face  . Other Other (See Comments)    ECG leads cause skin irritation.  Marland Kitchen Penicillins Hives  . Tetracyclines & Related Other (See Comments)    "Sores on legs"  . Diltiazem Rash and Other (See Comments)    Rash with long acting form  . Tetracycline Rash    Patient Measurements: Height: 5\' 9"  (175.3 cm) Weight: 205 lb 0.4 oz (93 kg) IBW/kg (Calculated) : 66.2  Vital Signs: Temp: 97.3 F (36.3 C) (01/04 1407) Temp Source: Oral (01/04 1407) BP: 103/86 mmHg (01/04 1413) Pulse Rate: 100 (01/04 1413)  Labs:  Recent Labs  04/22/15 1238  INR 2.6    CrCl cannot be calculated (Patient has no serum creatinine result on file.).   Medical History: Past Medical History  Diagnosis Date  . GERD (gastroesophageal reflux disease)   . Hypertension   . Seasonal allergies   . Lower leg edema   . Hypothyroidism   . Restless leg syndrome   . DJD (degenerative joint disease)   . Diverticular disease   . Dysphagia   . Hiatal hernia   . PAF (paroxysmal atrial fibrillation) (HCC)     s/p ablation done and also pill in the pocket flecainide  . Complication of anesthesia   . PONV (postoperative nausea and vomiting)   . Chronic diastolic heart failure (HCC) 04/22/2015  . Atrial flutter with rapid ventricular response (HCC) 04/22/2015   Assessment: 82 yo F presents on on 1/4 with Aib and CHF. Pharmacy consulted to dose coumadin. PTA coumadin is 2.5mg  daily exc 5mg  on Sun/Tues/Thurs. INR on admit is 2.6. CBC stable, no s/s of bleed.  Goal of Therapy:  INR 2-3 Monitor platelets by anticoagulation protocol: Yes   Plan:  Continue PTA coumadin 2.5mg  daily exc  5mg  on Sun/Tues/Thurs Monitor daily INR, CBC, s/s of bleed  Enzo Bi, PharmD, [facility] Clinical Pharmacist Pager [phone] 04/22/2015 5:16 PM

## 2015-04-22 NOTE — Progress Notes (Signed)
On call MD paged about patient heart rate. Per MD, no new orders at this time. Will continue to monitor patient.

## 2015-04-22 NOTE — H&P (Signed)
ELECTROPHYSIOLOGY CONSULT    Patient ID: Natasha [name] MRN: [id number], DOB/AGE: 1933-02-18 82 y.o.  Admit date: 04/22/2015 Date of admit: 04/22/2015   Primary Physician: Pearla Dubonnet, MD Primary Cardiologist: Dr. Mayford Knife  Reason for Consultation: Aflutter  HPI: Natasha [name] is a 82 y.o. female  who presented to the [facility] heart care office today for followup of afib and referred to the hospital for further care and management of her AFib/flutter which she is symptomatic with. She has PMHx of PAFib/flutter as detailed below as well as HTN, hypothyroidism, RLS.  Obatined from the H&P her AF history is as follows: She was seen in the Coumadin clinic 9/12 and noted to have recurrent atrial fibrillation with a rapid ventricular response and was seen by Dr. Jens Som. Patient has had 2 ablations at [facility]. Nuclear study September 2015 showed ejection fraction 76% and normal perfusion. Last echocardiogram December 2015 showed normal LV function, mild mitral regurgitation and tricuspid regurgitation. Mildly elevated pulmonary pressures. Patient had right heart catheterization in March 2016 which revealed a pulmonary capillary wedge pressure of 20 with preserved cardiac output. Pulmonary venous hypertension felt secondary to diastolic dysfunction.  She had re-occurrence of afib with RVR and she contacted [facility] and was placed on multaq. However she did not convert and apparently the medication made her feel bad and didn't continue this medication. They told her not to take Cardizem as they felt her blood pressure was too low. When she is not in atrial fibrillation she denies dyspnea, chest pain or syncope. She underwent DCCV on 9/13 to NSR. She was seen back in afib clinic and was in atrial flutter. It was recommended that she make an appt at [facility] to consider 3rd ablation which she did but they wanted to hold off on ablation until she went back in it again. She denies any chest  pain, LE edema, dizziness or syncope.Starting this past Sunday she went back in atrial fib/flutter with RVR and made an appt to be seen today. She has SOB with exertion and when in atrial arrhythmias. She has been perspiring more and getting hot.   She is currently feeling hungry mostly, she does feel the moment she goes into AFib with an uneasy feeling and significant fatigue, states it started Sunday while relaxing on the sofa, with particular trigger.  She denies any kind of CP, no rest SOB, but when in AF gets winded fairly easily, no dizziness, near syncope or syncope.  She has observed her BP tends to be low when her HR is fast.  Past Medical History  Diagnosis Date  . GERD (gastroesophageal reflux disease)   . Hypertension   . Seasonal allergies   . Lower leg edema   . Hypothyroidism   . Restless leg syndrome   . DJD (degenerative joint disease)   . Diverticular disease   . Dysphagia   . Hiatal hernia   . PAF (paroxysmal atrial fibrillation) (HCC)     s/p ablation done and also pill in the pocket flecainide  . Complication of anesthesia   . PONV (postoperative nausea and vomiting)   . Chronic diastolic heart failure (HCC) 04/22/2015  . Atrial flutter with rapid ventricular response (HCC) 04/22/2015     Surgical History:  Past Surgical History  Procedure Laterality Date  . Rhinoplasty    . Abdominal hysterectomy    . Appendectomy    . Tubal ligation    . Eye surgery      cataracat  .  Cardioversion      x3  . Breast surgery      left lumpectomy  . Tonsillectomy  1942  . Thyroidectomy  1973  . Bladder polyps    . Rotator cuff repair    . Esophagogastroduodenoscopy  03/03/2011    Procedure: ESOPHAGOGASTRODUODENOSCOPY (EGD);  Surgeon: Charolett Bumpers, MD;  Location: [facility];  Service: Endoscopy;  Laterality: N/A;  . Balloon dilation  03/03/2011    Procedure: BALLOON DILATION;  Surgeon: Charolett Bumpers, MD;  Location: [facility] ENDOSCOPY;  Service: Endoscopy;   Laterality: N/A;  . Atrial fibrillation ablation  09-06-13    2'14 -[facility]  . Cataract extraction Left   . Colonoscopy with propofol N/A 09/24/2013    Procedure: COLONOSCOPY WITH PROPOFOL;  Surgeon: Charolett Bumpers, MD;  Location: [facility] ENDOSCOPY;  Service: Endoscopy;  Laterality: N/A;  . Right heart catheterization N/A 07/11/2014    Procedure: RIGHT HEART CATH;  Surgeon: Laurey Morale, MD;  Location: [facility] CATH LAB;  Service: Cardiovascular;  Laterality: N/A;  . Cardioversion N/A 12/30/2014    Procedure: CARDIOVERSION;  Surgeon: Lewayne Bunting, MD;  Location: [facility] ENDOSCOPY;  Service: Cardiovascular;  Laterality: N/A;     Prescriptions prior to admission  Medication Sig Dispense Refill Last Dose  . acetaminophen (TYLENOL) 500 MG tablet Take 500 mg by mouth every 6 (six) hours as needed for mild pain or headache.   Taking  . butalbital-acetaminophen-caffeine (FIORICET WITH CODEINE) 50-325-40-30 MG per capsule Take 1 capsule by mouth every 4 (four) hours as needed. For migraines   Taking  . Calcium-Magnesium-Vitamin D (CITRACAL CALCIUM+D) 600-40-500 MG-MG-UNIT TB24 Take 1 tablet by mouth 2 (two) times daily.   Taking  . Cholecalciferol (VITAMIN D) 2000 UNITS CAPS Take 2,000 Units by mouth daily.   Taking  . esomeprazole (NEXIUM) 20 MG capsule Take 20 mg by mouth every other day.    Taking  . estradiol (ESTRACE) 1 MG tablet Take 1 mg by mouth daily.   Taking  . furosemide (LASIX) 40 MG tablet Take 40 mg by mouth daily.   Taking  . hydroxypropyl methylcellulose (ISOPTO TEARS) 2.5 % ophthalmic solution Place 1 drop into both eyes 3 (three) times daily as needed for dry eyes.   Taking  . levothyroxine (SYNTHROID, LEVOTHROID) 125 MCG tablet Take 125 mcg by mouth daily before breakfast. BRAND ONLY   Taking  . LORazepam (ATIVAN) 1 MG tablet Take 0.5-1 mg by mouth at bedtime as needed. For sleep   Taking  . Magnesium 500 MG CAPS Take 500 mg by mouth daily.   Taking  . nebivolol (BYSTOLIC) 5 MG tablet  Take 1/2 tablet (2.5mg ) in the AM and 1 tablet (5mg ) in the PM 45 tablet 3 Taking  . potassium chloride (K-DUR,KLOR-CON) 10 MEQ tablet TAKE 1 TABLET BY MOUTH 2 TIMES DAILY. 60 tablet 8 Taking  . warfarin (COUMADIN) 5 MG tablet Take 2.5-5 mg by mouth daily. 5 mg Sunday, Tuesday, Thursday, Saturday. 2.5mg  Monday, Wednesday and Friday   Taking    Inpatient Medications:   Allergies:  Allergies  Allergen Reactions  . Tramadol Nausea Only  . Codeine Nausea And Vomiting and Nausea Only  . Erythromycin Other (See Comments) and Hives    Flushing on face  . Other Other (See Comments)    ECG leads cause skin irritation.  Marland Kitchen Penicillins Hives  . Tetracyclines & Related Other (See Comments)    "Sores on legs"  . Diltiazem Rash and Other (See Comments)  Rash with long acting form  . Tetracycline Rash    Social History   Social History  . Marital Status: Married    Spouse Name: N/A  . Number of Children: N/A  . Years of Education: N/A   Occupational History  . retired    Social History Main Topics  . Smoking status: Never Smoker   . Smokeless tobacco: Never Used  . Alcohol Use: 0.0 oz/week    0 Standard drinks or equivalent per week     Comment: rare wine  . Drug Use: No  . Sexual Activity: No   Other Topics Concern  . Not on file   Social History Narrative     Family History  Problem Relation Age of Onset  . Anesthesia problems Neg Hx   . Hypotension Neg Hx   . Malignant hyperthermia Neg Hx   . Pseudochol deficiency Neg Hx   . Transient ischemic attack Mother   . Heart failure Father   . CVA Father   . Lung cancer Father   . Hypertension Sister   . Cancer Brother   . Sudden death Brother   . Heart attack Neg Hx      Review of Systems: All other systems reviewed and are otherwise negative except as noted above.  Physical Exam: Filed Vitals:   04/22/15 1300  BP: 112/93  Pulse: 124  Temp: 97.7 F (36.5 C)  TempSrc: Oral  Resp: 18  Height:  (1.753 m)   Weight: 205 lb 0.4 oz (93 kg)  SpO2: 100%    GEN- The patient is well appearing, alert and oriented x 3 today.   HEENT: normocephalic, atraumatic; sclera clear, conjunctiva pink; hearing intact; oropharynx clear; neck supple, no JVP Lymph- no cervical lymphadenopathy Lungs- Clear to ausculation bilaterally, normal work of breathing.  No wheezes, rales, rhonchi Heart- Regular rate and rhythm, no murmurs, rubs or gallops, PMI not laterally displaced GI- soft, non-tender, non-distended, bowel sounds present Extremities- no clubbing, cyanosis, or edema; DP/PT/radial pulses 2+ bilaterally MS- no significant deformity or atrophy Skin- warm and dry, no rash or lesion Psych- euthymic mood, full affect Neuro- no gross deficits observed  Labs:   Lab Results  Component Value Date   WBC 5.8 02/09/2015   HGB 14.4 02/09/2015   HCT 42.0 02/09/2015   MCV 95.7 02/09/2015   PLT 257 02/09/2015   No results for input(s): NA, K, CL, CO2, BUN, CREATININE, CALCIUM, PROT, BILITOT, ALKPHOS, ALT, AST, GLUCOSE in the last 168 hours.  Invalid input(s): LABALBU    Radiology/Studies: No results found.   07/11/14: RHC Final Conclusions: Preserved cardiac output (though numbers by Fick and thermodilution were somewhat different). She has mild pulmonary hypertension. I think that this is primarily pulmonary venous hypertension from elevated left atrial pressures (PCWP 20 with v-waves to 27). Her echo showed mild MR, so I suspect the prominent v-waves are due to LV diastolic dysfunction. Right-sided filling pressure is also elevated.  Recommendations: I would consider increasing home Lasix to lower filling pressures. Will route note to Drs Turner/Ramaswamy who will make determination of future plan.   12/30/14: DCCV Cardioverted 3 time(s).  Cardioverted at 120J, 150J and 200J.  Evaluation Findings: Post procedure EKG shows: NSR Complications: None Patient did tolerate procedure well.  01/19/15:  Echocardiogram Study Conclusions - Left ventricle: The cavity size was normal. Systolic function was vigorous. The estimated ejection fraction was in the range of 65% to 70%. Wall motion was normal; there were no regional wall  motion abnormalities. Left ventricular diastolic function parameters were normal. - Aortic valve: Trileaflet; normal thickness leaflets. There was no regurgitation. - Aortic root: The aortic root was normal in size. - Ascending aorta: The ascending aorta was normal in size. - Left atrium: The atrium was normal in size. - Right ventricle: Systolic function was normal. - Tricuspid valve: There was mild regurgitation. - Pulmonic valve: There was trivial regurgitation. - Pulmonary arteries: Systolic pressure was mildly increased. PA peak pressure: 36 mm Hg (S). - Inferior vena cava: The vessel was normal in size. The respirophasic diameter changes were in the normal range (= 50%), consistent with normal central venous pressure. - Pericardium, extracardiac: There was no pericardial effusion.   EKG: Atypical atrial Flutter at 129 bpm TELEMETRY: Afib/flutter, currently about 110, noted as high as 140  Assessment and Plan:  1. AF (paroxysmal atrial fibrillation) s/p recent DCCV to NSR but back in atrial fib/flutter with RVR at 130 bpm for the past 4 days. By her notes, she has failed medical therapy with Amiodarone, Multaq, Tikosyn and Flecainide. She has had a total if 2 AFib ablations at [facility], and total of 5 DCCV.  We will start with routine IV lopressor to try rate control, (she lists cardizem as an allergy) to her BP tolerance.  Further consideration for possible AV node ablation or other strategies pending further evaluation with Dr. Ladona Ridgel.    Norma Fredrickson, PA-C 04/22/2015 1:53 PM  EP attending  Patient seen and examined. Agree with the findings as noted above. Her exam reveals a iregular tachycardia with clear lungs and no  peripheral edema. The patient has had recurrent atrial flutter/fib, likely left atrial after several months of maintaining NSR. I have discussed the treatment options with the patient in detail. She has many questions. I have recommended we proceed with DCCV tomorrow. Going forward, I discussed AV node ablation and PPM (Dr. Macon Large has done so as well). Also we discussed rate control using a combination of verapamil and a beta blocker. She does not like long term metoprolol due to alopecia. The patient has been encouraged to talk with Dr. Macon Large. If she wants to have her device placed at [facility], I informed her that we would likely want her to remain at [facility] for EP followup and if placed here, we would expect her to have her EP followup here. She is reflecting.  Leonia Reeves.D.

## 2015-04-22 NOTE — Progress Notes (Signed)
Patient admitted to 2W29 A&Ox4. Orientation to room and unit completed. Complained of abdominal discomfort. Keitha Butte, PA notified and at bedside. Will continue to monitor.

## 2015-04-22 NOTE — Progress Notes (Signed)
Cardiology Office Note   Date:  04/22/2015   ID:  Natasha [name], DOB 1932-11-22, MRN [id number]  PCP:  Pearla Dubonnet, MD    Chief Complaint  Patient presents with  . Atrial Fibrillation  . Congestive Heart Failure      History of Present Illness: Natasha [name] is a 82 y.o. female who presents for followup of afib. She was seen in the Coumadin clinic 9/12 and noted to have recurrent atrial fibrillation with a rapid ventricular response and was seen by Dr. Jens Som. Patient has had 2 ablations at [facility]. Nuclear study September 2015 showed ejection fraction 76% and normal perfusion. Last echocardiogram December 2015 showed normal LV function, mild mitral regurgitation and tricuspid regurgitation. Mildly elevated pulmonary pressures. Patient had right heart catheterization in March 2016 which revealed a pulmonary capillary wedge pressure of 20 with preserved cardiac output. Pulmonary venous hypertension felt secondary to diastolic dysfunction.  She had reoccurrence of afib with RVR and she contacted [facility] and was placed on multaq. However she did not convert and apparently the medication made her feel bad and didn't continue this medication. They told her not to take Cardizem as they felt her blood pressure was too low. When she is not in atrial fibrillation she denies dyspnea, chest pain or syncope. She underwent DCCV on 9/13 to NSR.  She was seen back in afib clinic and was in atrial flutter. It was recommended that she make an appt at [facility] to consider 3rd ablation which she did but they wanted to hold off on ablation until she went back in it again. She denies any chest pain, LE edema, dizziness or syncope.Starting this past Sunday she went back in atrial flutter with RVR and made an appt to be seen today.   She has SOB with exertion and when in atrial arrhythmias.  She has been perspiring more and getting hot.        Past Medical History    Diagnosis Date  . GERD (gastroesophageal reflux disease)   . Hypertension   . Seasonal allergies   . Lower leg edema   . Hypothyroidism   . Restless leg syndrome   . DJD (degenerative joint disease)   . Diverticular disease   . Dysphagia   . Hiatal hernia   . PAF (paroxysmal atrial fibrillation) (HCC)     s/p ablation done and also pill in the pocket flecainide  . Complication of anesthesia   . PONV (postoperative nausea and vomiting)   . Chronic diastolic heart failure (HCC) 04/22/2015    Past Surgical History  Procedure Laterality Date  . Rhinoplasty    . Abdominal hysterectomy    . Appendectomy    . Tubal ligation    . Eye surgery      cataracat  . Cardioversion      x3  . Breast surgery      left lumpectomy  . Tonsillectomy  1942  . Thyroidectomy  1973  . Bladder polyps    . Rotator cuff repair    . Esophagogastroduodenoscopy  03/03/2011    Procedure: ESOPHAGOGASTRODUODENOSCOPY (EGD);  Surgeon: Charolett Bumpers, MD;  Location: [facility] ENDOSCOPY;  Service: Endoscopy;  Laterality: N/A;  . Balloon dilation  03/03/2011    Procedure: BALLOON DILATION;  Surgeon: Charolett Bumpers, MD;  Location: [facility] ENDOSCOPY;  Service: Endoscopy;  Laterality: N/A;  . Atrial fibrillation ablation  09-06-13  2'14 -[facility]  . Cataract extraction Left   . Colonoscopy with propofol N/A 09/24/2013    Procedure: COLONOSCOPY WITH PROPOFOL;  Surgeon: Charolett Bumpers, MD;  Location: [facility] ENDOSCOPY;  Service: Endoscopy;  Laterality: N/A;  . Right heart catheterization N/A 07/11/2014    Procedure: RIGHT HEART CATH;  Surgeon: Laurey Morale, MD;  Location: [facility] CATH LAB;  Service: Cardiovascular;  Laterality: N/A;  . Cardioversion N/A 12/30/2014    Procedure: CARDIOVERSION;  Surgeon: Lewayne Bunting, MD;  Location: [facility] ENDOSCOPY;  Service: Cardiovascular;  Laterality: N/A;     Current Outpatient Prescriptions  Medication Sig Dispense Refill  . acetaminophen (TYLENOL) 500 MG tablet Take 500 mg by  mouth every 6 (six) hours as needed for mild pain or headache.    . butalbital-acetaminophen-caffeine (FIORICET WITH CODEINE) 50-325-40-30 MG per capsule Take 1 capsule by mouth every 4 (four) hours as needed. For migraines    . Calcium-Magnesium-Vitamin D (CITRACAL CALCIUM+D) 600-40-500 MG-MG-UNIT TB24 Take 1 tablet by mouth 2 (two) times daily.    . Cholecalciferol (VITAMIN D) 2000 UNITS CAPS Take 2,000 Units by mouth daily.    Marland Kitchen esomeprazole (NEXIUM) 20 MG capsule Take 20 mg by mouth every other day.     . estradiol (ESTRACE) 1 MG tablet Take 1 mg by mouth daily.    . furosemide (LASIX) 40 MG tablet Take 40 mg by mouth daily.    . hydroxypropyl methylcellulose (ISOPTO TEARS) 2.5 % ophthalmic solution Place 1 drop into both eyes 3 (three) times daily as needed for dry eyes.    Marland Kitchen levothyroxine (SYNTHROID, LEVOTHROID) 125 MCG tablet Take 125 mcg by mouth daily before breakfast. BRAND ONLY    . LORazepam (ATIVAN) 1 MG tablet Take 0.5-1 mg by mouth at bedtime as needed. For sleep    . Magnesium 500 MG CAPS Take 500 mg by mouth daily.    . nebivolol (BYSTOLIC) 5 MG tablet Take 1/2 tablet (2.5mg ) in the AM and 1 tablet (5mg ) in the PM 45 tablet 3  . potassium chloride (K-DUR,KLOR-CON) 10 MEQ tablet TAKE 1 TABLET BY MOUTH 2 TIMES DAILY. 60 tablet 8  . warfarin (COUMADIN) 5 MG tablet Take 2.5-5 mg by mouth daily. 5 mg Sunday, Tuesday, Thursday, Saturday. 2.5mg  Monday, Wednesday and Friday     No current facility-administered medications for this visit.    Allergies:   Tramadol; Codeine; Erythromycin; Other; Penicillins; Tetracyclines & related; Diltiazem; and Tetracycline    Social History:  The patient  reports that she has never smoked. She has never used smokeless tobacco. She reports that she drinks alcohol. She reports that she does not use illicit drugs.   Family History:  The patient's family history includes CVA in her father; Cancer in her brother; Heart failure in her father; Hypertension  in her sister; Lung cancer in her father; Sudden death in her brother; Transient ischemic attack in her mother. There is no history of Anesthesia problems, Hypotension, Malignant hyperthermia, Pseudochol deficiency, or Heart attack.    ROS:  Please see the history of present illness.   Otherwise, review of systems are positive for none.   All other systems are reviewed and negative.    PHYSICAL EXAM: VS:  BP 100/62 mmHg  Ht 5\' 9"  (1.753 m)  Wt 209 lb (94.802 kg)  BMI 30.85 kg/m2 , BMI Body mass index is 30.85 kg/(m^2). GEN: Well nourished, well developed, in no acute distress HEENT: normal Neck: no JVD, carotid bruits, or masses Cardiac: RRR but  tachycardic; no murmurs, rubs, or gallops,no edema  Respiratory:  clear to auscultation bilaterally, normal work of breathing GI: soft, nontender, nondistended, + BS MS: no deformity or atrophy Skin: warm and dry, no rash Neuro:  Strength and sensation are intact Psych: euthymic mood, full affect   EKG:  EKG was ordered today and showed atrial flutter at 129bpm with anteroseptal infarct   Recent Labs: 08/04/2014: Magnesium 2.1; Pro B Natriuretic peptide (BNP) 100.0 12/29/2014: BUN 13; Creat 0.82; Potassium 4.5; Sodium 140 02/09/2015: Hemoglobin 14.4; Platelets 257    Lipid Panel No results found for: CHOL, TRIG, HDL, CHOLHDL, VLDL, LDLCALC, LDLDIRECT    Wt Readings from Last 3 Encounters:  04/22/15 209 lb (94.802 kg)  02/19/15 215 lb (97.523 kg)  01/15/15 211 lb 12.8 oz (96.072 kg)    ASSESSMENT AND PLAN:  Pulmonary hypertension As noted, she has mild Pulmonary HTN - primarily pulmonary venous HTN from elevated LA pressures (poss from diastolic dysfunction).   Chronic Diastolic CHF - appears euvolemic Continue Lasix. Last BMET was fine.   AF (paroxysmal atrial fibrillation) s/p recent DCCV to NSR but back in atrial flutter with RVR at 130 bpm for the past 4 days. . She has failed medical therapy with Amiodarone, Multaq,  Tikosyn and Flecainide. cannot increase BB further due to soft BP. Will admit to tele bed on EP service for further evaluation. ?Repeat DCCV vs. Repeat afib ablation or AVN ablation with PPM.  Will add IV Cardizem for rate control as BP tolerates.  Will check INR in office today prior to admission.  Essential hypertension Controlled.     Current medicines are reviewed at length with the patient today.  The patient does not have concerns regarding medicines.  The following changes have been made:  no change  Labs/ tests ordered today: See above Assessment and Plan No orders of the defined types were placed in this encounter.     Disposition:   FU with me in 6 months  Signed, Quintella Reichert, MD  04/22/2015 11:13 AM    [facility] [address] [place], [place], [place]  [address] Phone: [phone]; Fax: [phone]

## 2015-04-22 NOTE — H&P (Signed)
Cardiology Office Note   Date: 04/22/2015   ID: Natasha [name], DOB December 10, 1932, MRN [id number]  PCP: Pearla Dubonnet, MD   Chief Complaint  Patient presents with  . Atrial Fibrillation  . Congestive Heart Failure     History of Present Illness: Natasha [name] is a 82 y.o. female who presents for followup of afib. She was seen in the Coumadin clinic 9/12 and noted to have recurrent atrial fibrillation with a rapid ventricular response and was seen by Dr. Jens Som. Patient has had 2 ablations at [facility]. Nuclear study September 2015 showed ejection fraction 76% and normal perfusion. Last echocardiogram December 2015 showed normal LV function, mild mitral regurgitation and tricuspid regurgitation. Mildly elevated pulmonary pressures. Patient had right heart catheterization in March 2016 which revealed a pulmonary capillary wedge pressure of 20 with preserved cardiac output. Pulmonary venous hypertension felt secondary to diastolic dysfunction.  She had reoccurrence of afib with RVR and she contacted [facility] and was placed on multaq. However she did not convert and apparently the medication made her feel bad and didn't continue this medication. They told her not to take Cardizem as they felt her blood pressure was too low. When she is not in atrial fibrillation she denies dyspnea, chest pain or syncope. She underwent DCCV on 9/13 to NSR. She was seen back in afib clinic and was in atrial flutter. It was recommended that she make an appt at [facility] to consider 3rd ablation which she did but they wanted to hold off on ablation until she went back in it again. She denies any chest pain, LE edema, dizziness or syncope.Starting this past Sunday she went back in atrial flutter with RVR and made an appt to be seen  today. She has SOB with exertion and when in atrial arrhythmias. She has been perspiring more and getting hot.       Past Medical History  Diagnosis Date  . GERD (gastroesophageal reflux disease)   . Hypertension   . Seasonal allergies   . Lower leg edema   . Hypothyroidism   . Restless leg syndrome   . DJD (degenerative joint disease)   . Diverticular disease   . Dysphagia   . Hiatal hernia   . PAF (paroxysmal atrial fibrillation) (HCC)     s/p ablation done and also pill in the pocket flecainide  . Complication of anesthesia   . PONV (postoperative nausea and vomiting)   . Chronic diastolic heart failure (HCC) 04/22/2015    Past Surgical History  Procedure Laterality Date  . Rhinoplasty    . Abdominal hysterectomy    . Appendectomy    . Tubal ligation    . Eye surgery      cataracat  . Cardioversion      x3  . Breast surgery      left lumpectomy  . Tonsillectomy  1942  . Thyroidectomy  1973  . Bladder polyps    . Rotator cuff repair    . Esophagogastroduodenoscopy  03/03/2011    Procedure: ESOPHAGOGASTRODUODENOSCOPY (EGD); Surgeon: Charolett Bumpers, MD; Location: [facility]; Service: Endoscopy; Laterality: N/A;  . Balloon dilation  03/03/2011    Procedure: BALLOON DILATION; Surgeon: Charolett Bumpers, MD; Location: [facility] ENDOSCOPY; Service: Endoscopy; Laterality: N/A;  . Atrial fibrillation ablation  09-06-13    2'14 -[facility]  . Cataract extraction Left   . Colonoscopy with propofol N/A 09/24/2013    Procedure: COLONOSCOPY WITH PROPOFOL; Surgeon: Charolett Bumpers, MD; Location:  [facility] ENDOSCOPY; Service: Endoscopy; Laterality: N/A;  . Right heart catheterization N/A 07/11/2014    Procedure: RIGHT HEART CATH; Surgeon: Laurey Morale, MD; Location: [facility] CATH LAB; Service: Cardiovascular; Laterality: N/A;    . Cardioversion N/A 12/30/2014    Procedure: CARDIOVERSION; Surgeon: Lewayne Bunting, MD; Location: [facility] ENDOSCOPY; Service: Cardiovascular; Laterality: N/A;     Current Outpatient Prescriptions  Medication Sig Dispense Refill  . acetaminophen (TYLENOL) 500 MG tablet Take 500 mg by mouth every 6 (six) hours as needed for mild pain or headache.    . butalbital-acetaminophen-caffeine (FIORICET WITH CODEINE) 50-325-40-30 MG per capsule Take 1 capsule by mouth every 4 (four) hours as needed. For migraines    . Calcium-Magnesium-Vitamin D (CITRACAL CALCIUM+D) 600-40-500 MG-MG-UNIT TB24 Take 1 tablet by mouth 2 (two) times daily.    . Cholecalciferol (VITAMIN D) 2000 UNITS CAPS Take 2,000 Units by mouth daily.    Marland Kitchen esomeprazole (NEXIUM) 20 MG capsule Take 20 mg by mouth every other day.     . estradiol (ESTRACE) 1 MG tablet Take 1 mg by mouth daily.    . furosemide (LASIX) 40 MG tablet Take 40 mg by mouth daily.    . hydroxypropyl methylcellulose (ISOPTO TEARS) 2.5 % ophthalmic solution Place 1 drop into both eyes 3 (three) times daily as needed for dry eyes.    Marland Kitchen levothyroxine (SYNTHROID, LEVOTHROID) 125 MCG tablet Take 125 mcg by mouth daily before breakfast. BRAND ONLY    . LORazepam (ATIVAN) 1 MG tablet Take 0.5-1 mg by mouth at bedtime as needed. For sleep    . Magnesium 500 MG CAPS Take 500 mg by mouth daily.    . nebivolol (BYSTOLIC) 5 MG tablet Take 1/2 tablet (2.5mg ) in the AM and 1 tablet (5mg ) in the PM 45 tablet 3  . potassium chloride (K-DUR,KLOR-CON) 10 MEQ tablet TAKE 1 TABLET BY MOUTH 2 TIMES DAILY. 60 tablet 8  . warfarin (COUMADIN) 5 MG tablet Take 2.5-5 mg by mouth daily. 5 mg Sunday, Tuesday, Thursday, Saturday. 2.5mg  Monday, Wednesday and Friday     No current facility-administered medications for this visit.    Allergies: Tramadol; Codeine; Erythromycin; Other; Penicillins;  Tetracyclines & related; Diltiazem; and Tetracycline    Social History: The patient  reports that she has never smoked. She has never used smokeless tobacco. She reports that she drinks alcohol. She reports that she does not use illicit drugs.   Family History: The patient's family history includes CVA in her father; Cancer in her brother; Heart failure in her father; Hypertension in her sister; Lung cancer in her father; Sudden death in her brother; Transient ischemic attack in her mother. There is no history of Anesthesia problems, Hypotension, Malignant hyperthermia, Pseudochol deficiency, or Heart attack.    ROS: Please see the history of present illness. Otherwise, review of systems are positive for none. All other systems are reviewed and negative.    PHYSICAL EXAM: VS: BP 100/62 mmHg  Ht 5\' 9"  (1.753 m)  Wt 209 lb (94.802 kg)  BMI 30.85 kg/m2 , BMI Body mass index is 30.85 kg/(m^2). GEN: Well nourished, well developed, in no acute distress  HEENT: normal  Neck: no JVD, carotid bruits, or masses Cardiac: RRR but tachycardic; no murmurs, rubs, or gallops,no edema  Respiratory: clear to auscultation bilaterally, normal work of breathing GI: soft, nontender, nondistended, + BS MS: no deformity or atrophy  Skin: warm and dry, no rash Neuro: Strength and sensation are intact Psych: euthymic mood, full affect  EKG: EKG was ordered today and showed atrial flutter at 129bpm with anteroseptal infarct   Recent Labs: 08/04/2014: Magnesium 2.1; Pro B Natriuretic peptide (BNP) 100.0 12/29/2014: BUN 13; Creat 0.82; Potassium 4.5; Sodium 140 02/09/2015: Hemoglobin 14.4; Platelets 257    Lipid Panel  Labs (Brief)    No results found for: CHOL, TRIG, HDL, CHOLHDL, VLDL, LDLCALC, LDLDIRECT     Wt Readings from Last 3 Encounters:  04/22/15 209 lb (94.802 kg)  02/19/15 215 lb (97.523 kg)  01/15/15 211 lb 12.8 oz (96.072 kg)    ASSESSMENT AND PLAN:  Pulmonary  hypertension As noted, she has mild Pulmonary HTN - primarily pulmonary venous HTN from elevated LA pressures (poss from diastolic dysfunction).   Chronic Diastolic CHF - appears euvolemic Continue Lasix. Last BMET was fine.   AF (paroxysmal atrial fibrillation) s/p recent DCCV to NSR but back in atrial flutter with RVR at 130 bpm for the past 4 days. . She has failed medical therapy with Amiodarone, Multaq, Tikosyn and Flecainide. cannot increase BB further due to soft BP. Will admit to tele bed on EP service for further evaluation. ?Repeat DCCV vs. Repeat afib ablation or AVN ablation with PPM. Will add IV Cardizem for rate control as BP tolerates. Will check INR in office today prior to admission.  Essential hypertension Controlled.     Current medicines are reviewed at length with the patient today. The patient does not have concerns regarding medicines.  The following changes have been made: no change  Labs/ tests ordered today: See above Assessment and Plan No orders of the defined types were placed in this encounter.    Disposition: FU with me in 6 months  Signed, Quintella Reichert, MD  04/22/2015 11:13 AM  [facility] [address] [place], [place], [place] [address] Phone: [phone]; Fax: [phone]

## 2015-04-23 ENCOUNTER — Observation Stay (HOSPITAL_COMMUNITY): Payer: Medicare Other | Admitting: Anesthesiology

## 2015-04-23 ENCOUNTER — Other Ambulatory Visit: Payer: Self-pay

## 2015-04-23 ENCOUNTER — Encounter (HOSPITAL_COMMUNITY)
Admission: AD | Disposition: A | Discharge status: Home / Self Care | Payer: Self-pay | Source: Ambulatory Visit | Attending: Internal Medicine

## 2015-04-23 ENCOUNTER — Encounter (HOSPITAL_COMMUNITY): Payer: Self-pay

## 2015-04-23 DIAGNOSIS — I081 Rheumatic disorders of both mitral and tricuspid valves: Secondary | ICD-10-CM | POA: Diagnosis not present

## 2015-04-23 DIAGNOSIS — E039 Hypothyroidism, unspecified: Secondary | ICD-10-CM | POA: Diagnosis not present

## 2015-04-23 DIAGNOSIS — K219 Gastro-esophageal reflux disease without esophagitis: Secondary | ICD-10-CM | POA: Diagnosis not present

## 2015-04-23 DIAGNOSIS — I4891 Unspecified atrial fibrillation: Secondary | ICD-10-CM | POA: Diagnosis not present

## 2015-04-23 DIAGNOSIS — Z880 Allergy status to penicillin: Secondary | ICD-10-CM | POA: Diagnosis not present

## 2015-04-23 DIAGNOSIS — I4892 Unspecified atrial flutter: Secondary | ICD-10-CM | POA: Diagnosis not present

## 2015-04-23 DIAGNOSIS — I10 Essential (primary) hypertension: Secondary | ICD-10-CM | POA: Diagnosis not present

## 2015-04-23 HISTORY — PX: CARDIOVERSION: SHX1299

## 2015-04-23 LAB — PROTIME-INR
INR: 2.58 — ABNORMAL HIGH (ref 0.00–1.49)
PROTHROMBIN TIME: 27.3 s — AB (ref 11.6–15.2)

## 2015-04-23 LAB — CBC
HCT: 41.4 % (ref 36.0–46.0)
Hemoglobin: 14.1 g/dL (ref 12.0–15.0)
MCH: 32.4 pg (ref 26.0–34.0)
MCHC: 34.1 g/dL (ref 30.0–36.0)
MCV: 95.2 fL (ref 78.0–100.0)
PLATELETS: 252 10*3/uL (ref 150–400)
RBC: 4.35 MIL/uL (ref 3.87–5.11)
RDW: 13.2 % (ref 11.5–15.5)
WBC: 7 10*3/uL (ref 4.0–10.5)

## 2015-04-23 SURGERY — CARDIOVERSION (CATH LAB)

## 2015-04-23 SURGERY — CARDIOVERSION
Anesthesia: General

## 2015-04-23 MED ORDER — PROPOFOL 10 MG/ML IV BOLUS
INTRAVENOUS | Status: DC | PRN
  Administered 2015-04-23 (×2): 50 mg via INTRAVENOUS

## 2015-04-23 MED ORDER — SODIUM CHLORIDE 0.9 % IV SOLN
250.0000 mL | INTRAVENOUS | Status: DC
  Administered 2015-04-23: 13:00:00 via INTRAVENOUS

## 2015-04-23 MED ORDER — SODIUM CHLORIDE 0.9 % IV SOLN
INTRAVENOUS | Status: DC
  Administered 2015-04-23: 500 mL via INTRAVENOUS

## 2015-04-23 MED ORDER — HYDROCORTISONE 1 % EX CREA
1.0000 | TOPICAL_CREAM | CUTANEOUS | Status: DC | PRN
Start: 2015-04-23 — End: 2015-04-23
  Filled 2015-04-23: qty 28

## 2015-04-23 MED ORDER — DILTIAZEM HCL 25 MG/5ML IV SOLN
10.0000 mg | INTRAVENOUS | Status: DC | PRN
  Administered 2015-04-23: 10 mg via INTRAVENOUS
  Filled 2015-04-23 (×3): qty 5

## 2015-04-23 MED ORDER — SODIUM CHLORIDE 0.9 % IJ SOLN: 3.0000 mL | INTRAMUSCULAR | Status: DC | PRN

## 2015-04-23 MED ORDER — VERAPAMIL HCL ER 120 MG PO TBCR: 120.0000 mg | EXTENDED_RELEASE_TABLET | Freq: Every day | ORAL | Status: DC

## 2015-04-23 MED ORDER — SODIUM CHLORIDE 0.9 % IJ SOLN
3.0000 mL | Freq: Two times a day (BID) | INTRAMUSCULAR | Status: DC
  Administered 2015-04-23: 3 mL via INTRAVENOUS

## 2015-04-23 NOTE — Care Management Obs Status (Signed)
MEDICARE OBSERVATION STATUS NOTIFICATION   Patient Details  Name: ZYNAH ASBURY MRN: [id number] Date of Birth: Jul 09, 1932   Medicare Observation Status Notification Given:  Yes    Cherylann Parr, RN 04/23/2015, 3:52 PM

## 2015-04-23 NOTE — Anesthesia Preprocedure Evaluation (Addendum)
Anesthesia Evaluation  Patient identified by MRN, date of birth, ID band Patient awake    Reviewed: Allergy & Precautions, NPO status , Patient's Chart, lab work & pertinent test results  History of Anesthesia Complications (+) PONV and history of anesthetic complications  Airway Mallampati: II  TM Distance: >3 FB Neck ROM: Full    Dental  (+) Teeth Intact, Dental Advisory Given   Pulmonary shortness of breath and with exertion,    breath sounds clear to auscultation       Cardiovascular hypertension, Pt. on medications and Pt. on home beta blockers + dysrhythmias Atrial Fibrillation  Rhythm:Irregular Rate:Normal  '15 stress: 76% and normal perfusion.  '15 ECHO:  normal LV function, mild MR, mild TR.   Neuro/Psych negative neurological ROS     GI/Hepatic Neg liver ROS, GERD  Medicated and Controlled,  Endo/Other  Hypothyroidism Morbid obesity  Renal/GU negative Renal ROS     Musculoskeletal  (+) Arthritis , Osteoarthritis,    Abdominal (+) + obese,   Peds  Hematology  (+) Blood dyscrasia (coumadin, INR 2.58), ,   Anesthesia Other Findings   Reproductive/Obstetrics                         Anesthesia Physical Anesthesia Plan  ASA: III  Anesthesia Plan: General   Post-op Pain Management:    Induction: Intravenous  Airway Management Planned: Mask  Additional Equipment:   Intra-op Plan:   Post-operative Plan:   Informed Consent: I have reviewed the patients History and Physical, chart, labs and discussed the procedure including the risks, benefits and alternatives for the proposed anesthesia with the patient or authorized representative who has indicated his/her understanding and acceptance.   Dental advisory given  Plan Discussed with: Anesthesiologist, Surgeon and CRNA  Anesthesia Plan Comments: (Plan routine monitors, GA for cardioversion)       Anesthesia Quick  Evaluation

## 2015-04-23 NOTE — Interval H&P Note (Signed)
History and Physical Interval Note:  04/23/2015 10:45 AM  Natasha [name]  has presented today for surgery, with the diagnosis of afib  The various methods of treatment have been discussed with the patient and family. After consideration of risks, benefits and other options for treatment, the patient has consented to  Procedure(s): CARDIOVERSION (N/A) as a surgical intervention .  The patient's history has been reviewed, patient examined, no change in status, stable for surgery.  I have reviewed the patient's chart and labs.  Questions were answered to the patient's satisfaction.     Mehtaab Mayeda

## 2015-04-23 NOTE — Op Note (Signed)
Procedure: Electrical Cardioversion Indications:  Atrial Flutter  Procedure Details:  Consent: Risks of procedure as well as the alternatives and risks of each were explained to the (patient/caregiver).  Consent for procedure obtained.  Time Out: Verified patient identification, verified procedure, site/side was marked, verified correct patient position, special equipment/implants available, medications/allergies/relevent history reviewed, required imaging and test results available.  Performed  Patient placed on cardiac monitor, pulse oximetry, supplemental oxygen as necessary.  Sedation given: IV propofol 100 mg, Dr. Jean Rosenthal, Anesthesiology Pacer pads placed anterior and posterior chest.  Cardioverted 2 time(s).  Cardioversion with synchronized biphasic 150J and 200J shock. 1st shock was ineffective. 2nd shock stopped the original arrhythmia, rapidly replaced by a second type of atrial flutter with longer cycle length and 2:1 AV conduction and ventricular rate of 88 bpm, which after 1-2 minutes converted to sinus rhythm with PACs, 66 bpm .  Evaluation: Findings: Post procedure EKG shows: NSR Complications: None Patient did tolerate procedure well.  Time Spent Directly with the Patient:  30 minutes   Natasha [name] 04/23/2015, 12:52 PM

## 2015-04-23 NOTE — Transfer of Care (Signed)
Immediate Anesthesia Transfer of Care Note  Patient: Natasha [name]  Procedure(s) Performed: Procedure(s): CARDIOVERSION (N/A)  Patient Location: Endoscopy Unit  Anesthesia Type:General  Level of Consciousness: awake, alert  and oriented  Airway & Oxygen Therapy: Patient Spontanous Breathing and Patient connected to nasal cannula oxygen  Post-op Assessment: Report given to RN and Post -op Vital signs reviewed and stable  Post vital signs: Reviewed and stable  Last Vitals:  Filed Vitals:   04/23/15 1018 04/23/15 1227  BP: 108/78 102/71  Pulse: 146 156  Temp:  36.4 C  Resp:  16    Complications: No apparent anesthesia complications

## 2015-04-23 NOTE — Progress Notes (Signed)
Spoke with PA regarding patients current heart rate after giving metoprolol 5mg  IV one hour ago. Heart rate remains in the 140s. Plan to cardiovert this afternoon, not wanting to give more meds at this time.  Minerva Ends

## 2015-04-23 NOTE — Progress Notes (Addendum)
SUBJECTIVE: The patient is doing well today. Notes feeling like she "was going to die last night".    At this time, she denies chest pain, shortness of breath, or any new concerns.  Marland Kitchen estradiol  1 mg Oral Daily  . furosemide  40 mg Oral BID  . levothyroxine  125 mcg Oral QAC breakfast  . magnesium oxide  400 mg Oral Daily  . metoprolol  5 mg Intravenous Q4H  . potassium chloride  10 mEq Oral Once  . warfarin  5 mg Oral Once per day on Sun Tue Thu   And  . warfarin  2.5 mg Oral Once per day on Mon Wed Fri Sat  . Warfarin - Pharmacist Dosing Inpatient   Does not apply q1800      OBJECTIVE: Physical Exam: Filed Vitals:   04/22/15 2323 04/22/15 2340 04/23/15 0020 04/23/15 0234  BP: 86/54 93/72 107/73 99/79  Pulse: 143  140 137  Temp:      TempSrc:      Resp:   22 18  Height:      Weight:      SpO2:   98%     Intake/Output Summary (Last 24 hours) at 04/23/15 0435 Last data filed at 04/23/15 0250  Gross per 24 hour  Intake    240 ml  Output    800 ml  Net   -560 ml    Telemetry reveals atrial fib/flutter with a RVR/CVR GEN- The patient is well appearing, alert and oriented x 3 today.   Head- normocephalic, atraumatic Eyes-  Sclera clear, conjunctiva pink Ears- hearing intact Oropharynx- clear Neck- supple, no JVP Lungs- Clear to ausculation bilaterally, normal work of breathing Heart- irregular rate and rhythm, no significant murmurs, no rubs or gallops GI- soft, NT, ND, + BS Extremities- no clubbing, cyanosis, or edema Skin- no rash or lesion Psych- euthymic mood, full affect Neuro- no gross deficits appreciated  LABS: Basic Metabolic Panel:  Recent Labs  04/54/09 1652  NA 136  K 4.7  CL 102  CO2 24  GLUCOSE 108*  BUN 14  CREATININE 0.84  CALCIUM 9.0  MG 2.1    CBC:  Recent Labs  04/22/15 1652 04/23/15 0300  WBC 6.1 7.0  NEUTROABS 3.3  --   HGB 14.9 14.1  HCT 42.3 41.4  MCV 94.4 95.2  PLT 253 252   Thyroid Function Tests:  Recent  Labs  04/22/15 1652  TSH 1.548    RADIOLOGY: No results found.  ASSESSMENT AND PLAN:  Active Problems:   Hypertension   Dyspnea   Chronic diastolic heart failure (HCC)   Atrial flutter with rapid ventricular response (HCC)   Atrial fibrillation with rapid ventricular response (HCC)  1. AF (paroxysmal atrial fibrillation) s/p recent (2 months ago) DCCV to NSR but back in atrial fib/flutter with RVR at 130 bpm for the past few days. By her notes, she has failed medical therapy with Amiodarone, Multaq, Tikosyn and Flecainide. She has had a total if 2 AFib ablations at [facility], and total of 5 DCCV.  AMMENDED to include CHADS2Vasc score Her CHADS2VAsc score is at least 3 and is chronically on coumadin. INR's have been therapeutic: 04/22/14 2.58 04/22/15 2.6 03/23/15 2.0 02/23/15 2.3   Francis Dowse, Cordelia Poche 04/23/2015 4:35 AM  EP Attending  Patient seen and examined. She has an IRIR rhythm and her rate is under better control. She has been therapeutic with her INR's and is NPO. Will schedule DCCV.  Sharlot Gowda  Taylor,M.D.

## 2015-04-23 NOTE — Progress Notes (Signed)
ANTICOAGULATION CONSULT NOTE  Pharmacy Consult for Coumadin Indication: atrial fibrillation  Allergies  Allergen Reactions  . Tramadol Nausea Only  . Codeine Nausea And Vomiting and Nausea Only  . Digoxin And Related     ' Makes my face blood red"  . Erythromycin Other (See Comments) and Hives    Flushing on face  . Other Other (See Comments)    ECG leads cause skin irritation.  Marland Kitchen Penicillins Hives    Has patient had a PCN reaction causing immediate rash, facial/tongue/throat swelling, SOB or lightheadedness with hypotension: No Has patient had a PCN reaction causing severe rash involving mucus membranes or skin necrosis: No Has patient had a PCN reaction that required hospitalization No Has patient had a PCN reaction occurring within the last 10 years: No If all of the above answers are "NO", then may proceed with Cephalosporin use.  . Tetracyclines & Related Other (See Comments)    "Sores on legs"  . Diltiazem Rash and Other (See Comments)    Rash with long acting form  . Tetracycline Rash    Patient Measurements: Height: 5\' 9"  (175.3 cm) Weight: 205 lb 0.4 oz (93 kg) IBW/kg (Calculated) : 66.2  Vital Signs: Temp: 97.7 F (36.5 C) (01/05 0549) Temp Source: Oral (01/05 0549) BP: 108/78 mmHg (01/05 1018) Pulse Rate: 146 (01/05 1018)  Labs:  Recent Labs  04/22/15 1238 04/22/15 1652 04/23/15 0300  HGB  --  14.9 14.1  HCT  --  42.3 41.4  PLT  --  253 252  LABPROT  --   --  27.3*  INR 2.6  --  2.58*  CREATININE  --  0.84  --     Estimated Creatinine Clearance: 64.8 mL/min (by C-G formula based on Cr of 0.84).  Assessment: 82 yo F presents on on 1/4 with Afib and CHF. Pharmacy consulted to dose coumadin. PTA coumadin is 2.5mg  daily exc 5mg  on Sun/Tues/Thurs. INR remains therapeutic on her home regimen. CBC is WNL and no bleeding noted. Undergoing DCCV today.   Goal of Therapy:  INR 2-3 Monitor platelets by anticoagulation protocol: Yes   Plan:  Continue PTA  coumadin 2.5mg  daily exc 5mg  on Sun/Tues/Thurs Monitor daily INR, CBC, s/s of bleed  Lysle Pearl, PharmD, BCPS Pager # [phone] 04/23/2015 11:00 AM

## 2015-04-23 NOTE — Discharge Instructions (Signed)
An appointment for an  INR (warfarin check/lab draw) for 04/30/15 at 10:50AM.  No driving today.

## 2015-04-23 NOTE — Anesthesia Postprocedure Evaluation (Signed)
Anesthesia Post Note  Patient: Natasha [name]  Procedure(s) Performed: Procedure(s) (LRB): CARDIOVERSION (N/A)  Patient location during evaluation: Endoscopy Anesthesia Type: General Level of consciousness: awake and alert and oriented Vital Signs Assessment: post-procedure vital signs reviewed and stable Respiratory status: spontaneous breathing and respiratory function stable Cardiovascular status: blood pressure returned to baseline Anesthetic complications: no    Last Vitals:  Filed Vitals:   04/23/15 1018 04/23/15 1227  BP: 108/78 102/71  Pulse: 146 156  Temp:  36.4 C  Resp:  16    Last Pain:  Filed Vitals:   04/23/15 1231  PainSc: 0-No pain                 Dairl Ponder

## 2015-04-23 NOTE — Discharge Summary (Signed)
Marland Kitchen      DISCHARGE SUMMARY    Patient ID: Natasha [name],  MRN: [id number], DOB/AGE: 1933/02/03 82 y.o.  Admit date: 04/22/2015 Discharge date: 04/23/2015  Primary Care Physician: Pearla Dubonnet, MD Primary Cardiologist: Dr. Mayford Knife Electrophysiologist: Dr. Ladona Ridgel (new), routinely follows with Dr. Macon [facility] at [facility]  Primary Discharge Diagnosis:  1. Aflutter with RVR  Secondary Discharge Diagnosis:  1. HTN 2. Hypothyroidism 3. GERD  Allergies  Allergen Reactions  . Tramadol Nausea Only  . Codeine Nausea And Vomiting and Nausea Only  . Digoxin And Related     ' Makes my face blood red"  . Erythromycin Other (See Comments) and Hives    Flushing on face  . Other Other (See Comments)    ECG leads cause skin irritation.  Marland Kitchen Penicillins Hives    Has patient had a PCN reaction causing immediate rash, facial/tongue/throat swelling, SOB or lightheadedness with hypotension: No Has patient had a PCN reaction causing severe rash involving mucus membranes or skin necrosis: No Has patient had a PCN reaction that required hospitalization No Has patient had a PCN reaction occurring within the last 10 years: No If all of the above answers are "NO", then may proceed with Cephalosporin use.  . Tetracyclines & Related Other (See Comments)    "Sores on legs"  . Diltiazem Rash and Other (See Comments)    Rash with long acting form  . Tetracycline Rash     Procedures This Admission:  04/23/15: Cardioversion with Dr. Royann Shivers Cardioverted 2 time(s).  Cardioversion with synchronized biphasic 150J and 200J shock. 1st shock was ineffective. 2nd shock stopped the original arrhythmia, rapidly replaced by a second type of atrial flutter with longer cycle length and 2:1 AV conduction and ventricular rate of 88 bpm, which after 1-2 minutes converted to sinus rhythm with PACs, 66 bpm . Evaluation: Findings: Post procedure EKG shows: NSR Complications: None Patient did tolerate procedure  well.   Brief HPI: Natasha [name] is a 82 y.o. female was referred to the hospital by her cardiologist with c/o that she had felt herself go into AF about 4 days earlier and still had not converted back, noted to be in AFlutter with RVR.  Hospital Course:  The patient was admitted and started on IV lopressor to aid in rate control, she did require IV bolus of cardizem last night that she states made her feel terrible, particularly very hot, flushed.  Mentioned an allergy to long acting cardizem only.  Her BP remained stable.  Her labs stable, INR therapeutic, with TSH wnl.  She underwent DCCV today with conversion to SR, 1st degree AVblock.  She has been on telemetry and remains in SR 2 hours ost cardioversion, sitting at the bedside eating lunch, without anginal c/o, mild chest wall discomfort post cardioversion only.  No SOB, palpitations.  She feels well.  As discussed with by Dr. Ladona Ridgel this morning, we will send her home on her Bystolic with verapamil, she states she has taken verapamil historically many years ago and had no reactions or allergy to it.  Her BP is stable, HR 70-80's, sinus with 1st degree AVblock on telemetry.  The patient is being discharged she is given a post-hospital f/u with Dr.Taylor if she needs it, though as discussed with the patient by Dr. Ladona Ridgel this morning as well, encouraged to f/u with her primary electrophysiologist Dr. Macon Large to discuss her response to this strategy and alternative rhythm/rate management options as well.  We have made an appointment  for her to have an INR draw 04/30/15 at 10:50AM, as well as a f/u visit with Dr. Mayford Knife.   Physical Exam: Filed Vitals:   04/23/15 1324 04/23/15 1334 04/23/15 1350 04/23/15 1405  BP: 98/60 95/59 102/65 102/71  Pulse: 71 73 72 72  Temp:    97.6 F (36.4 C)  TempSrc:    Oral  Resp: 18   18  Height:      Weight:      SpO2: 98% 98% 99% 98%    GEN- The patient is well appearing, alert and oriented x 3 today.    HEENT: normocephalic, atraumatic; sclera clear, conjunctiva pink; hearing intact; neck supple, no JVP Lymph- no cervical lymphadenopathy Lungs- Clear to ausculation bilaterally, normal work of breathing.  No wheezes, rales, rhonchi Heart- Regular rate and rhythm, no murmurs, rubs or gallops, PMI not laterally displaced GI- soft, non-tender, non-distended Extremities- no clubbing, cyanosis, or edema MS- no significant deformity or atrophy Skin- warm and dry, no rash or lesion Psych- euthymic mood, full affect Neuro- no gross deficits    Labs:   Lab Results  Component Value Date   WBC 7.0 04/23/2015   HGB 14.1 04/23/2015   HCT 41.4 04/23/2015   MCV 95.2 04/23/2015   PLT 252 04/23/2015     Recent Labs Lab 04/22/15 1652  NA 136  K 4.7  CL 102  CO2 24  BUN 14  CREATININE 0.84  CALCIUM 9.0  GLUCOSE 108*    Discharge Medications:    Medication List    TAKE these medications        acetaminophen 500 MG tablet  Commonly known as:  TYLENOL  Take 500 mg by mouth every 6 (six) hours as needed for mild pain or headache.     butalbital-acetaminophen-caffeine 50-325-40-30 MG capsule  Commonly known as:  FIORICET WITH CODEINE  Take 1 capsule by mouth every 4 (four) hours as needed. For migraines     CITRACAL CALCIUM+D 600-40-500 MG-MG-UNIT Tb24  Generic drug:  Calcium-Magnesium-Vitamin D  Take 1 tablet by mouth 2 (two) times daily.     estradiol 1 MG tablet  Commonly known as:  ESTRACE  Take 1 mg by mouth at bedtime.     furosemide 40 MG tablet  Commonly known as:  LASIX  Take 40 mg by mouth 2 (two) times daily.     hydroxypropyl methylcellulose / hypromellose 2.5 % ophthalmic solution  Commonly known as:  ISOPTO TEARS / GONIOVISC  Place 1 drop into both eyes 3 (three) times daily as needed for dry eyes.     levothyroxine 125 MCG tablet  Commonly known as:  SYNTHROID, LEVOTHROID  Take 125 mcg by mouth daily before breakfast. BRAND ONLY     LORazepam 1 MG  tablet  Commonly known as:  ATIVAN  Take 0.5-1 mg by mouth at bedtime as needed for sleep. For sleep     Magnesium 500 MG Caps  Take 500 mg by mouth daily.     nebivolol 5 MG tablet  Commonly known as:  BYSTOLIC  Take 1/2 tablet (2.5mg ) in the AM and 1 tablet (5mg ) in the PM     NEXIUM 20 MG capsule  Generic drug:  esomeprazole  Take 20 mg by mouth every other day.     potassium chloride 10 MEQ tablet  Commonly known as:  K-DUR,KLOR-CON  TAKE 1 TABLET BY MOUTH 2 TIMES DAILY.     verapamil 120 MG CR tablet  Commonly known as:  CALAN-SR  Take 1 tablet (120 mg total) by mouth at bedtime.     Vitamin D 2000 units Caps  Take 2,000 Units by mouth daily. Reported on 04/22/2015     warfarin 5 MG tablet  Commonly known as:  COUMADIN  Take 2.5-5 mg by mouth daily at 6 PM. 5 mg Sunday, Tuesday, Thursday, 2.5mg  Monday, Wednesday, Friday, Saturday        Disposition:  Discharge Instructions    Diet - low sodium heart healthy    Complete by:  As directed      Increase activity slowly    Complete by:  As directed           Follow-up Information    Follow up with Lewayne Bunting, MD On 05/25/2015.   Specialty:  Cardiology   Why:  10:00AM   Contact information:   [address] [place] [address] [phone]       Follow up with Quintella Reichert, MD On 07/02/2015.   Specialty:  Cardiology   Why:  9:45AM   Contact information:   [address] [place] [address] [phone]       Duration of Discharge Encounter: Greater than 30 minutes including physician time.  Norma Fredrickson, PA-C 04/23/2015 3:33 PM  EP Attending  Patient seen and examined. Agree with the findings as noted above. She is stable for discharge. She will followup with [place] or Dr. Macon Large. At the time of discharge, the patient was unsure whether she wanted to followup with [place] or Dr. Macon Large.  Leonia Reeves.D.

## 2015-04-24 ENCOUNTER — Telehealth: Payer: Self-pay | Admitting: Cardiology

## 2015-04-24 ENCOUNTER — Encounter (HOSPITAL_COMMUNITY): Payer: Self-pay | Admitting: Cardiovascular Disease

## 2015-04-24 NOTE — Telephone Encounter (Signed)
New message      Pt request to talk to Dr Mayford Knife to get her opinion on getting a pacemaker with Dr Ladona Ridgel vs going to [facility].  If possible, she want Dr Mayford Knife only

## 2015-04-24 NOTE — Telephone Encounter (Signed)
Patient stated that she went back into atrial flutter with RVR after she got home from hospital discharge.  She took her meds and this am she was back in sinus bradycardia.  I have encouraged her to call [facility] to discuss with her electrophysiologist whether a 3rd ablation is appropriate or should she proceed with AVN ablation with PPM.  If he recommends AVN ablation I told her to call me and I would set her up with Dr. Ladona Ridgel to do her procedure.  Patient agrees.

## 2015-04-30 ENCOUNTER — Ambulatory Visit (INDEPENDENT_AMBULATORY_CARE_PROVIDER_SITE_OTHER): Payer: Medicare Other | Admitting: Pharmacist Clinician (PhC)/ Clinical Pharmacy Specialist

## 2015-04-30 DIAGNOSIS — I4891 Unspecified atrial fibrillation: Secondary | ICD-10-CM | POA: Diagnosis not present

## 2015-04-30 LAB — POCT INR: INR: 2.7

## 2015-05-04 ENCOUNTER — Encounter: Payer: Medicare Other | Admitting: Pharmacist Clinician (PhC)/ Clinical Pharmacy Specialist

## 2015-05-08 ENCOUNTER — Telehealth: Payer: Self-pay | Admitting: Internal Medicine

## 2015-05-08 ENCOUNTER — Inpatient Hospital Stay (HOSPITAL_COMMUNITY): Admission: RE | Admit: 2015-05-08 | Payer: Medicare Other | Source: Ambulatory Visit | Admitting: Nurse Practitioner

## 2015-05-08 NOTE — Telephone Encounter (Signed)
Calling stating she had a cardioversion  2 wks ago at [facility].  States this past Wed 1/18 her HR has been elevated and BP has been up and down. Ranges have been 112/83 HR 129; 130/11 HR 104; 81/60 HR 138; 127/79 HR 146; 123/66 HR 83.  States she has a wrist BP unit.  She is scheduled to see her EP Dr at [facility] on 1/30 and Dr. Ladona Ridgel on 2/6. She is taking Bystolic 2.5 mg in AM and 5 mg in PM and Verapamil 120 mg at HS.  Denies CP, SOB or dizziness.  States she just feels very weak and just has to sit on couch because if she moves around the HR goes up and she feels bad. Advised that she should get a BP Cuff to fit upper arm. Spoke w/ Dr. Cato Mulligan who suggest that Rivka Safer in AF clinic see her.  Called [facility] and was told pt could come in at 1:00 today but pt lives in [place] and can not make the appointment today.  Spoke w/Donna Carroll,NP who will see her Monday 1/23 at 10:30. Lupita Leash is not comfortable adjusting medications without being seen. When called her back she states she has an appointment with her PCP, Dr. Kevan Ny at 9:40 and will probably be late getting to see Lupita Leash.  She also states she has sent her husband to [employer] to get a BP cuff.  She will try to stay "low key" this weekend and see Lupita Leash on Monday.  She verbalizes understanding.

## 2015-05-08 NOTE — Telephone Encounter (Signed)
New message      Pt c/o BP issue: STAT if pt c/o blurred vision, one-sided weakness or slurred speech  1. What are your last 5 BP readings?  112/83 HR 129, 130/111 HR 104, 81/60 HR 138, 127/79 HR 146, 123/66 HR 83 2. Are you having any other symptoms (ex. Dizziness, headache, blurred vision, passed out)? no  3. What is your BP issue?  Pt had a cardioversion in early jan----it lasted only 2 weeks.  Bp and HR is "all over the place".  Please call

## 2015-05-11 ENCOUNTER — Ambulatory Visit (HOSPITAL_COMMUNITY)
Admission: RE | Admit: 2015-05-11 | Discharge: 2015-05-11 | Disposition: A | Discharge status: Home / Self Care | Payer: Medicare Other | Source: Ambulatory Visit | Attending: Nurse Practitioner | Admitting: Nurse Practitioner

## 2015-05-11 VITALS — HR 142 | Ht 69.0 in | Wt 210.0 lb

## 2015-05-11 DIAGNOSIS — I10 Essential (primary) hypertension: Secondary | ICD-10-CM | POA: Insufficient documentation

## 2015-05-11 DIAGNOSIS — K5790 Diverticulosis of intestine, part unspecified, without perforation or abscess without bleeding: Secondary | ICD-10-CM | POA: Diagnosis not present

## 2015-05-11 DIAGNOSIS — I5032 Chronic diastolic (congestive) heart failure: Secondary | ICD-10-CM | POA: Insufficient documentation

## 2015-05-11 DIAGNOSIS — E559 Vitamin D deficiency, unspecified: Secondary | ICD-10-CM | POA: Diagnosis not present

## 2015-05-11 DIAGNOSIS — K219 Gastro-esophageal reflux disease without esophagitis: Secondary | ICD-10-CM | POA: Insufficient documentation

## 2015-05-11 DIAGNOSIS — Z9889 Other specified postprocedural states: Secondary | ICD-10-CM | POA: Insufficient documentation

## 2015-05-11 DIAGNOSIS — Z801 Family history of malignant neoplasm of trachea, bronchus and lung: Secondary | ICD-10-CM | POA: Insufficient documentation

## 2015-05-11 DIAGNOSIS — R600 Localized edema: Secondary | ICD-10-CM | POA: Insufficient documentation

## 2015-05-11 DIAGNOSIS — K449 Diaphragmatic hernia without obstruction or gangrene: Secondary | ICD-10-CM | POA: Diagnosis not present

## 2015-05-11 DIAGNOSIS — I119 Hypertensive heart disease without heart failure: Secondary | ICD-10-CM | POA: Diagnosis not present

## 2015-05-11 DIAGNOSIS — I484 Atypical atrial flutter: Secondary | ICD-10-CM | POA: Diagnosis not present

## 2015-05-11 DIAGNOSIS — R0609 Other forms of dyspnea: Secondary | ICD-10-CM | POA: Diagnosis not present

## 2015-05-11 DIAGNOSIS — I480 Paroxysmal atrial fibrillation: Secondary | ICD-10-CM | POA: Insufficient documentation

## 2015-05-11 DIAGNOSIS — I252 Old myocardial infarction: Secondary | ICD-10-CM | POA: Diagnosis not present

## 2015-05-11 DIAGNOSIS — G2581 Restless legs syndrome: Secondary | ICD-10-CM | POA: Insufficient documentation

## 2015-05-11 DIAGNOSIS — M1990 Unspecified osteoarthritis, unspecified site: Secondary | ICD-10-CM | POA: Insufficient documentation

## 2015-05-11 DIAGNOSIS — R5382 Chronic fatigue, unspecified: Secondary | ICD-10-CM | POA: Diagnosis not present

## 2015-05-11 DIAGNOSIS — Z8249 Family history of ischemic heart disease and other diseases of the circulatory system: Secondary | ICD-10-CM | POA: Insufficient documentation

## 2015-05-11 DIAGNOSIS — R1310 Dysphagia, unspecified: Secondary | ICD-10-CM | POA: Diagnosis not present

## 2015-05-11 DIAGNOSIS — I4892 Unspecified atrial flutter: Secondary | ICD-10-CM

## 2015-05-11 DIAGNOSIS — Z0001 Encounter for general adult medical examination with abnormal findings: Secondary | ICD-10-CM | POA: Diagnosis not present

## 2015-05-11 DIAGNOSIS — E039 Hypothyroidism, unspecified: Secondary | ICD-10-CM | POA: Diagnosis not present

## 2015-05-11 DIAGNOSIS — Z79899 Other long term (current) drug therapy: Secondary | ICD-10-CM | POA: Diagnosis not present

## 2015-05-11 DIAGNOSIS — Z1389 Encounter for screening for other disorder: Secondary | ICD-10-CM | POA: Diagnosis not present

## 2015-05-11 DIAGNOSIS — I4891 Unspecified atrial fibrillation: Secondary | ICD-10-CM | POA: Insufficient documentation

## 2015-05-11 DIAGNOSIS — Z823 Family history of stroke: Secondary | ICD-10-CM | POA: Diagnosis not present

## 2015-05-11 DIAGNOSIS — R51 Headache: Secondary | ICD-10-CM | POA: Diagnosis not present

## 2015-05-12 ENCOUNTER — Encounter (HOSPITAL_COMMUNITY): Payer: Self-pay | Admitting: Nurse Practitioner

## 2015-05-12 NOTE — Addendum Note (Signed)
Encounter addended by: Newman Nip, NP on: 05/12/2015  1:01 PM<BR>     Documentation filed: Notes Section

## 2015-05-12 NOTE — Progress Notes (Addendum)
Patient ID: Natasha [name], female   DOB: 1932/11/10, 82 y.o.   MRN: [id number]     Primary Care Physician: Pearla Dubonnet, MD Referring Physician: Elesa Hacker street office triage Cardiologist: Dr. Verdene Rio is a 82 y.o. female with a h/o  persistent afib with ablation x2 at [facility] by Dr. Macon Large, seen in the afib clinic in September. . She was seen Dr. Macon Large in August and was in afib with RVR. She was offered a 3rd ablation which pt deferred so she was given multaq 400 mg bid to try. She took this and felt bad and was told to stop. She was then seen by Dr. Jens Som, being DOD, when she and her husband was in the coumadin clinic and she had afib with RVR. She was set up for cardioversion which unfortunately only lasted 7 days. She called the triage nurse this am to report afib with rvr and referred to the afib clinic for further evaluation.  Pt  has failed flecainide, multaq and tikosyn. She states that she is not a candidate for amiodarone due to presence of hypertension. Her BP is soft frequently so not a lot of room to increase rate control meds. She was on digoxin at one time years ago. She is considering going back to Dr. Macon Large for a third ablation and will make appointment. Another option may be AV node ablation/PPM.  She returns today f/u triage call due to return of rapid heart rate since last Friday. EKG show aflutter with RVR, around 140 bpm, which pt appears to be tolerating. She denies lightheadedness, feeling presyncopal. She does feel fatigued and short of breath. She is pending aa appointment with [facility] in one week with Dr. Macon Large. Apparently last time she spoke to Dr. Macon Large, herr options were AVnodal ablation/PPM or 3rd ablation, which pt wanted to think about. She also had a recent cardioversion 1/5, which held less than two weeks. She is very frustrated with her current heart issues.  Today, she denies symptoms of palpitations, chest pain, shortness of  breath, orthopnea, PND, lower extremity edema, dizziness, presyncope, syncope, or neurologic sequela. The patient is tolerating medications without difficulties and is otherwise without complaint today.   Past Medical History  Diagnosis Date  . GERD (gastroesophageal reflux disease)   . Hypertension   . Seasonal allergies   . Lower leg edema   . Hypothyroidism   . Restless leg syndrome   . DJD (degenerative joint disease)   . Diverticular disease   . Dysphagia   . Hiatal hernia   . PAF (paroxysmal atrial fibrillation) (HCC)     s/p ablation done and also pill in the pocket flecainide  . Complication of anesthesia   . PONV (postoperative nausea and vomiting)   . Chronic diastolic heart failure (HCC) 04/22/2015  . Atrial flutter with rapid ventricular response (HCC) 04/22/2015   Past Surgical History  Procedure Laterality Date  . Rhinoplasty    . Abdominal hysterectomy    . Appendectomy    . Tubal ligation    . Eye surgery      cataracat  . Cardioversion      x3  . Breast surgery      left lumpectomy  . Tonsillectomy  1942  . Thyroidectomy  1973  . Bladder polyps    . Rotator cuff repair    . Esophagogastroduodenoscopy  03/03/2011    Procedure: ESOPHAGOGASTRODUODENOSCOPY (EGD);  Surgeon: Charolett Bumpers, MD;  Location: [facility] ENDOSCOPY;  Service: Endoscopy;  Laterality: N/A;  . Balloon dilation  03/03/2011    Procedure: BALLOON DILATION;  Surgeon: Charolett Bumpers, MD;  Location: [facility] ENDOSCOPY;  Service: Endoscopy;  Laterality: N/A;  . Atrial fibrillation ablation  09-06-13    2'14 -[facility]  . Cataract extraction Left   . Colonoscopy with propofol N/A 09/24/2013    Procedure: COLONOSCOPY WITH PROPOFOL;  Surgeon: Charolett Bumpers, MD;  Location: [facility] ENDOSCOPY;  Service: Endoscopy;  Laterality: N/A;  . Right heart catheterization N/A 07/11/2014    Procedure: RIGHT HEART CATH;  Surgeon: Laurey Morale, MD;  Location: [facility] CATH LAB;  Service: Cardiovascular;  Laterality: N/A;  .  Cardioversion N/A 12/30/2014    Procedure: CARDIOVERSION;  Surgeon: Lewayne Bunting, MD;  Location: [facility] ENDOSCOPY;  Service: Cardiovascular;  Laterality: N/A;  . Cardioversion N/A 04/23/2015    Procedure: CARDIOVERSION;  Surgeon: Thurmon Fair, MD;  Location: [facility] ENDOSCOPY;  Service: Cardiovascular;  Laterality: N/A;    Current Outpatient Prescriptions  Medication Sig Dispense Refill  . acetaminophen (TYLENOL) 500 MG tablet Take 500 mg by mouth every 6 (six) hours as needed for mild pain or headache.    . butalbital-acetaminophen-caffeine (FIORICET WITH CODEINE) 50-325-40-30 MG per capsule Take 1 capsule by mouth every 4 (four) hours as needed. For migraines    . Calcium-Magnesium-Vitamin D (CITRACAL CALCIUM+D) 600-40-500 MG-MG-UNIT TB24 Take 1 tablet by mouth 2 (two) times daily.    . Cholecalciferol (VITAMIN D) 2000 UNITS CAPS Take 2,000 Units by mouth daily. Reported on 04/22/2015    . esomeprazole (NEXIUM) 20 MG capsule Take 20 mg by mouth every other day.     . estradiol (ESTRACE) 1 MG tablet Take 1 mg by mouth at bedtime.     . furosemide (LASIX) 40 MG tablet Take 40 mg by mouth 2 (two) times daily.     . hydroxypropyl methylcellulose (ISOPTO TEARS) 2.5 % ophthalmic solution Place 1 drop into both eyes 3 (three) times daily as needed for dry eyes.    Marland Kitchen levothyroxine (SYNTHROID, LEVOTHROID) 125 MCG tablet Take 125 mcg by mouth daily before breakfast. BRAND ONLY    . LORazepam (ATIVAN) 1 MG tablet Take 0.5-1 mg by mouth at bedtime as needed for sleep. For sleep    . Magnesium 500 MG CAPS Take 500 mg by mouth daily.    . nebivolol (BYSTOLIC) 5 MG tablet Take 1/2 tablet (2.5mg ) in the AM and 1 tablet ( ) in the PM (Patient taking differently: Take 2.5-5 mg by mouth 2 (two) times daily. Take 1/2 tablet (2.5mg ) in the AM and 1 tablet ( ) in the PM) 45 tablet 3  . potassium chloride (K-DUR,KLOR-CON) 10 MEQ tablet TAKE 1 TABLET BY MOUTH 2 TIMES DAILY. 60 tablet 8  . verapamil (CALAN-SR) 120 MG CR  tablet Take 1 tablet (120 mg total) by mouth at bedtime. 30 tablet 3  . warfarin (COUMADIN) 5 MG tablet Take 2.5-5 mg by mouth daily at 6 PM. 5 mg Sunday, Tuesday, Thursday, 2.5mg  Monday, Wednesday, Friday, Saturday     No current facility-administered medications for this encounter.    Allergies  Allergen Reactions  . Tramadol Nausea Only  . Codeine Nausea And Vomiting and Nausea Only  . Digoxin And Related     ' Makes my face blood red"  . Erythromycin Other (See Comments) and Hives    Flushing on face  . Other Other (See Comments)    ECG leads cause skin irritation.  Marland Kitchen Penicillins Hives  Has patient had a PCN reaction causing immediate rash, facial/tongue/throat swelling, SOB or lightheadedness with hypotension: No Has patient had a PCN reaction causing severe rash involving mucus membranes or skin necrosis: No Has patient had a PCN reaction that required hospitalization No Has patient had a PCN reaction occurring within the last 10 years: No If all of the above answers are "NO", then may proceed with Cephalosporin use.  . Tetracyclines & Related Other (See Comments)    "Sores on legs"  . Diltiazem Rash and Other (See Comments)    Rash with long acting form  . Tetracycline Rash    Social History   Social History  . Marital Status: Married    Spouse Name: N/A  . Number of Children: N/A  . Years of Education: N/A   Occupational History  . retired    Social History Main Topics  . Smoking status: Never Smoker   . Smokeless tobacco: Never Used  . Alcohol Use: 0.0 oz/week    0 Standard drinks or equivalent per week     Comment: rare wine  . Drug Use: No  . Sexual Activity: No   Other Topics Concern  . Not on file   Social History Narrative    Family History  Problem Relation Age of Onset  . Anesthesia problems Neg Hx   . Hypotension Neg Hx   . Malignant hyperthermia Neg Hx   . Pseudochol deficiency Neg Hx   . Transient ischemic attack Mother   . Heart  failure Father   . CVA Father   . Lung cancer Father   . Hypertension Sister   . Cancer Brother   . Sudden death Brother   . Heart attack Neg Hx     ROS- All systems are reviewed and negative except as per the HPI above  Physical Exam: Filed Vitals:   05/11/15 1047  Pulse: 142  Height: 5\' 9"  (1.753 m)  Weight: 210 lb (95.255 kg)    GEN- The patient is well appearing, alert and oriented x 3 today.   Head- normocephalic, atraumatic Eyes-  Sclera clear, conjunctiva pink Ears- hearing intact Oropharynx- clear Neck- supple, no JVP Lymph- no cervical lymphadenopathy Lungs- Clear to ausculation bilaterally, normal work of breathing Heart- Irregular rate and rhythm, no murmurs, rubs or gallops, PMI not laterally displaced GI- soft, NT, ND, + BS Extremities- no clubbing, cyanosis, or edema MS- no significant deformity or atrophy Skin- no rash or lesion Psych- euthymic mood, full affect Neuro- strength and sensation are intact  EKG- Accelerated junctional rhythm vrs a atypical atrial flutter at 123 bpm, QRS 52 ms, QTc 460 ms Epic records reviewed  Assessment and Plan: 1. Persistent symptomatic afib/flutter with a complicated history and failure of antiarrythmic's/cardioversions Very complicated pt, cannot increase rate control meds because of frequent soft BP in the 90 systolic range She does have 30 mg cardizem if needed, if she has a systolic BP over 161 mg, can use. She can try moving verapamil to lunch instead of bedtime because she seems to be most symptomatic during the day, take the larger dose of Bystolic in am, instead of at bedtime. Offered another cardioversion to at least try to hold her in SR until  seen at [facility] next week but she declined.  Offered to send pt to the ER but she declined. Called [facility] and they did not have any sooner appointment. Will discuss with Dr. Ladona Ridgel with whom she has f/u in February, she states that whatever Dr.  Macon Large suggests to do, she wants  Dr. Ladona Ridgel to do locally. Continue warfarin   Elvina Sidle. [facility] [address] [place], [place] [address] [phone]   Addendum- 1/24- reviewed with Dr. Ladona Ridgel and he suggested digoxin but pt could not tolerate in the past. He said he would glad to perform a AV nodal ablation/PPM,but pt still wants to discuss with [facility] first. I called her today to let her know of his suggestions. She states that since she changed the timing of her mediciation that she is now has a heart rate less than 100 and BP is good at 120 systolic.

## 2015-05-18 DIAGNOSIS — I440 Atrioventricular block, first degree: Secondary | ICD-10-CM | POA: Diagnosis not present

## 2015-05-18 DIAGNOSIS — Z9889 Other specified postprocedural states: Secondary | ICD-10-CM | POA: Diagnosis not present

## 2015-05-18 DIAGNOSIS — I481 Persistent atrial fibrillation: Secondary | ICD-10-CM | POA: Diagnosis not present

## 2015-05-18 DIAGNOSIS — I471 Supraventricular tachycardia: Secondary | ICD-10-CM | POA: Diagnosis not present

## 2015-05-18 DIAGNOSIS — Z7901 Long term (current) use of anticoagulants: Secondary | ICD-10-CM | POA: Diagnosis not present

## 2015-05-18 DIAGNOSIS — I480 Paroxysmal atrial fibrillation: Secondary | ICD-10-CM | POA: Diagnosis not present

## 2015-05-18 DIAGNOSIS — I517 Cardiomegaly: Secondary | ICD-10-CM | POA: Diagnosis not present

## 2015-05-18 DIAGNOSIS — I361 Nonrheumatic tricuspid (valve) insufficiency: Secondary | ICD-10-CM | POA: Diagnosis not present

## 2015-05-18 DIAGNOSIS — I340 Nonrheumatic mitral (valve) insufficiency: Secondary | ICD-10-CM | POA: Diagnosis not present

## 2015-05-20 ENCOUNTER — Other Ambulatory Visit: Payer: Self-pay | Admitting: Physician Assistant

## 2015-05-20 ENCOUNTER — Other Ambulatory Visit: Payer: Self-pay

## 2015-05-20 DIAGNOSIS — Z1231 Encounter for screening mammogram for malignant neoplasm of breast: Secondary | ICD-10-CM

## 2015-05-21 NOTE — Telephone Encounter (Signed)
Please advise on what patients current furosemide therapy should be. 04/22/15 office note indicates that she takes 40mg  qd, but it looks like her dose was changed to 40mg  bid at the hospital 04/23/15. Just wanted to clarify. Thanks, MI

## 2015-05-25 ENCOUNTER — Ambulatory Visit (INDEPENDENT_AMBULATORY_CARE_PROVIDER_SITE_OTHER): Payer: Medicare Other | Admitting: *Deleted

## 2015-05-25 ENCOUNTER — Ambulatory Visit (INDEPENDENT_AMBULATORY_CARE_PROVIDER_SITE_OTHER): Payer: Medicare Other | Admitting: Internal Medicine

## 2015-05-25 ENCOUNTER — Encounter: Payer: Self-pay | Admitting: Internal Medicine

## 2015-05-25 VITALS — BP 124/82 | HR 60 | Ht 69.0 in | Wt 210.0 lb

## 2015-05-25 DIAGNOSIS — I4891 Unspecified atrial fibrillation: Secondary | ICD-10-CM

## 2015-05-25 DIAGNOSIS — I480 Paroxysmal atrial fibrillation: Secondary | ICD-10-CM

## 2015-05-25 LAB — POCT INR: INR: 2.2

## 2015-05-25 NOTE — Patient Instructions (Signed)
Medication Instructions:  Your physician recommends that you continue on your current medications as directed. Please refer to the Current Medication list given to you today.  Labwork: None ordered  Testing/Procedures: None ordered  Follow-Up: Your physician recommends that you schedule a follow-up appointment in: 6-8 weeks with Dr. Ladona Ridgel.  Any Other Special Instructions Will Be Listed Below (If Applicable). Call if you would like to schedule a procedure.  If you need a refill on your cardiac medications before your next appointment, please call your pharmacy.  Thank you for choosing [facility]!!

## 2015-05-25 NOTE — Progress Notes (Signed)
HPI Natasha [name] returns today for post op followup. She is a pleasant 82 yo woman with atrial fib and a RVR, HTN, who has had 2 atrial fib ablations at [facility]. She has continued to have atrial fib/flutter. She was hospitalized several weeks ago and underwent DCCV. She returned to atrial fib and was placed on Verapamil in addition to Bystolic. She has seen Rudi Coco and also been back to see Dr. Macon Large at [facility]. She appears to be maintaining NSR. She has many questions today regarding her long term treatment.  Allergies  Allergen Reactions  . Tramadol Nausea Only  . Codeine Nausea And Vomiting and Nausea Only  . Digoxin And Related     ' Makes my face blood red"  . Erythromycin Other (See Comments) and Hives    Flushing on face  . Other Other (See Comments)    ECG leads cause skin irritation.  Natasha [name] Kitchen Penicillins Hives    Has patient had a PCN reaction causing immediate rash, facial/tongue/throat swelling, SOB or lightheadedness with hypotension: No Has patient had a PCN reaction causing severe rash involving mucus membranes or skin necrosis: No Has patient had a PCN reaction that required hospitalization No Has patient had a PCN reaction occurring within the last 10 years: No If all of the above answers are "NO", then may proceed with Cephalosporin use.  . Tetracyclines & Related Other (See Comments)    "Sores on legs"  . Diltiazem Rash and Other (See Comments)    Rash with long acting form  . Sulfites Rash    PRESERVATIVE OF SOME FOODS- MAKES FLUSHES TO FACE AND NECK  . Tetracycline Rash     Current Outpatient Prescriptions  Medication Sig Dispense Refill  . acetaminophen (TYLENOL) 500 MG tablet Take 500 mg by mouth every 6 (six) hours as needed for mild pain or headache.    . butalbital-acetaminophen-caffeine (FIORICET WITH CODEINE) 50-325-40-30 MG per capsule Take 1 capsule by mouth every 4 (four) hours as needed. For migraines    . Calcium-Magnesium-Vitamin D (CITRACAL  CALCIUM+D) 600-40-500 MG-MG-UNIT TB24 Take 1 tablet by mouth daily.     . Cholecalciferol (VITAMIN D) 2000 UNITS CAPS Take 2,000 Units by mouth daily. Reported on 04/22/2015    . esomeprazole (NEXIUM) 20 MG capsule Take 20 mg by mouth every other day.     . estradiol (ESTRACE) 1 MG tablet Take 1 mg by mouth at bedtime.     . furosemide (LASIX) 40 MG tablet Take 40 mg by mouth 2 (two) times daily.     . hydroxypropyl methylcellulose (ISOPTO TEARS) 2.5 % ophthalmic solution Place 1 drop into both eyes 3 (three) times daily as needed for dry eyes.    Natasha [name] Kitchen levothyroxine (SYNTHROID, LEVOTHROID) 125 MCG tablet Take 125 mcg by mouth daily before breakfast. BRAND ONLY    . LORazepam (ATIVAN) 1 MG tablet Take 0.5-1 mg by mouth at bedtime as needed for sleep. For sleep    . Magnesium 500 MG CAPS Take 500 mg by mouth daily.    . nebivolol (BYSTOLIC) 5 MG tablet Take 5 mg by mouth. Take one tablet by mouth in the morning and 1/2 tablet in the evening    . potassium chloride (K-DUR,KLOR-CON) 10 MEQ tablet TAKE 1 TABLET BY MOUTH 2 TIMES DAILY. 60 tablet 8  . verapamil (CALAN) 120 MG tablet Take 120 mg by mouth daily.    Natasha [name] Kitchen warfarin (COUMADIN) 5 MG tablet Take 2.5-5 mg by mouth daily at  6 PM. 5 mg Sunday, Tuesday, Thursday, 2.5mg  Monday, Wednesday, Friday, Saturday     No current facility-administered medications for this visit.     Past Medical History  Diagnosis Date  . GERD (gastroesophageal reflux disease)   . Hypertension   . Seasonal allergies   . Lower leg edema   . Hypothyroidism   . Restless leg syndrome   . DJD (degenerative joint disease)   . Diverticular disease   . Dysphagia   . Hiatal hernia   . PAF (paroxysmal atrial fibrillation) (HCC)     s/p ablation done and also pill in the pocket flecainide  . Complication of anesthesia   . PONV (postoperative nausea and vomiting)   . Chronic diastolic heart failure (HCC) 04/22/2015  . Atrial flutter with rapid ventricular response (HCC) 04/22/2015     ROS:   All systems reviewed and negative except as noted in the HPI.   Past Surgical History  Procedure Laterality Date  . Rhinoplasty    . Abdominal hysterectomy    . Appendectomy    . Tubal ligation    . Eye surgery      cataracat  . Cardioversion      x3  . Breast surgery      left lumpectomy  . Tonsillectomy  1942  . Thyroidectomy  1973  . Bladder polyps    . Rotator cuff repair    . Esophagogastroduodenoscopy  03/03/2011    Procedure: ESOPHAGOGASTRODUODENOSCOPY (EGD);  Surgeon: Charolett Bumpers, MD;  Location: [facility];  Service: Endoscopy;  Laterality: N/A;  . Balloon dilation  03/03/2011    Procedure: BALLOON DILATION;  Surgeon: Charolett Bumpers, MD;  Location: [facility] ENDOSCOPY;  Service: Endoscopy;  Laterality: N/A;  . Atrial fibrillation ablation  09-06-13    2'14 -[facility]  . Cataract extraction Left   . Colonoscopy with propofol N/A 09/24/2013    Procedure: COLONOSCOPY WITH PROPOFOL;  Surgeon: Charolett Bumpers, MD;  Location: [facility] ENDOSCOPY;  Service: Endoscopy;  Laterality: N/A;  . Right heart catheterization N/A 07/11/2014    Procedure: RIGHT HEART CATH;  Surgeon: Laurey Morale, MD;  Location: [facility] CATH LAB;  Service: Cardiovascular;  Laterality: N/A;  . Cardioversion N/A 12/30/2014    Procedure: CARDIOVERSION;  Surgeon: Lewayne Bunting, MD;  Location: [facility] ENDOSCOPY;  Service: Cardiovascular;  Laterality: N/A;  . Cardioversion N/A 04/23/2015    Procedure: CARDIOVERSION;  Surgeon: Thurmon Fair, MD;  Location: [facility] ENDOSCOPY;  Service: Cardiovascular;  Laterality: N/A;     Family History  Problem Relation Age of Onset  . Anesthesia problems Neg Hx   . Hypotension Neg Hx   . Malignant hyperthermia Neg Hx   . Pseudochol deficiency Neg Hx   . Transient ischemic attack Mother   . Heart failure Father   . CVA Father   . Lung cancer Father   . Hypertension Sister   . Cancer Brother   . Sudden death Brother   . Heart attack Neg Hx      Social History    Social History  . Marital Status: Married    Spouse Name: N/A  . Number of Children: N/A  . Years of Education: N/A   Occupational History  . retired    Social History Main Topics  . Smoking status: Never Smoker   . Smokeless tobacco: Never Used  . Alcohol Use: 0.0 oz/week    0 Standard drinks or equivalent per week     Comment: rare wine  . Drug  Use: No  . Sexual Activity: No   Other Topics Concern  . Not on file   Social History Narrative     BP 124/82 mmHg  Pulse 60  Ht 5\' 9"  (1.753 m)  Wt 210 lb (95.255 kg)  BMI 31.00 kg/m2  Physical Exam:  Well appearing elderly woman, NAD HEENT: Unremarkable Neck:  6 cmJVD, no thyromegally Lymphatics:  No adenopathy Back:  No CVA tenderness Lungs:  Clear with no wheezes HEART:  Regular rate rhythm, no murmurs, no rubs, no clicks Abd:  soft, positive bowel sounds, no organomegally, no rebound, no guarding Ext:  2 plus pulses, no edema, no cyanosis, no clubbing Skin:  No rashes no nodules Neuro:  CN II through XII intact, motor grossly intact  Assess/Plan:  1. Atrial fib with a RVR - she is s/p 2 ablations and has had continued atrial fib. We discussed at length her multiple treatment options. For now she will continue her current medical regimen. We discussed amiodarone as well as AV node ablation and PPM insertion as well as another atrial fib ablation. We discussed the treatment options. For now, she will continue her strategy of Verapamil and bystolic. We will see her back in 2 months.   2. HTN heart disease - her blood pressure is well controlled. No change in meds.   3. Constipation - she has had some on verapamil. She will continue a laxative as needed.   I spent 45 minutes with this patient with at least 50% face to face time.  Leonia Reeves.D.

## 2015-06-02 ENCOUNTER — Ambulatory Visit
Admission: RE | Admit: 2015-06-02 | Discharge: 2015-06-02 | Disposition: A | Discharge status: Home / Self Care | Payer: Medicare Other | Source: Ambulatory Visit

## 2015-06-02 DIAGNOSIS — Z1231 Encounter for screening mammogram for malignant neoplasm of breast: Secondary | ICD-10-CM | POA: Diagnosis not present

## 2015-06-05 ENCOUNTER — Other Ambulatory Visit: Payer: Self-pay | Admitting: Internal Medicine

## 2015-06-05 DIAGNOSIS — R928 Other abnormal and inconclusive findings on diagnostic imaging of breast: Secondary | ICD-10-CM

## 2015-06-10 ENCOUNTER — Ambulatory Visit
Admission: RE | Admit: 2015-06-10 | Discharge: 2015-06-10 | Disposition: A | Discharge status: Home / Self Care | Payer: Medicare Other | Source: Ambulatory Visit | Attending: Internal Medicine | Admitting: Internal Medicine

## 2015-06-10 DIAGNOSIS — R928 Other abnormal and inconclusive findings on diagnostic imaging of breast: Secondary | ICD-10-CM

## 2015-06-10 DIAGNOSIS — N6489 Other specified disorders of breast: Secondary | ICD-10-CM | POA: Diagnosis not present

## 2015-06-18 ENCOUNTER — Encounter: Payer: Self-pay | Admitting: Internal Medicine

## 2015-06-18 ENCOUNTER — Telehealth: Payer: Self-pay | Admitting: Internal Medicine

## 2015-06-18 ENCOUNTER — Ambulatory Visit (INDEPENDENT_AMBULATORY_CARE_PROVIDER_SITE_OTHER): Payer: Medicare Other | Admitting: Internal Medicine

## 2015-06-18 VITALS — BP 122/76 | HR 74 | Ht 69.0 in | Wt 209.6 lb

## 2015-06-18 DIAGNOSIS — R911 Solitary pulmonary nodule: Secondary | ICD-10-CM

## 2015-06-18 DIAGNOSIS — R0600 Dyspnea, unspecified: Secondary | ICD-10-CM | POA: Diagnosis not present

## 2015-06-18 NOTE — Telephone Encounter (Signed)
You sent this to triage. 

## 2015-06-18 NOTE — Patient Instructions (Signed)
ICD-9-CM ICD-10-CM   1. Nodule of right lung 793.11 R91.1   2. Dyspnea 786.09 R06.00     #Nodule of the right lung upper lobe and other pulmonary nodules -Stable since May 2016 and November 2016 - Please do a follow-up CT chest without contrast in November 2017  #Shortness of breath - Glad this is significantly better after Lasix - I will send a message to Dr. Sharrell Ku to see if it'll hiatal hernia is contributing to your difficult to control atrial fibrillation   #Follow-up - In November 2017 after CT chest without contrast

## 2015-06-18 NOTE — Progress Notes (Signed)
Subjective:     Patient ID: Natasha [name], female   DOB: 09/04/1932, 82 y.o.   MRN: [id number]  PCP Pearla Dubonnet, MD   HPI PCP Pearla Dubonnet, MD Cards: Dr Armanda Magic EP: Dr Velta Addison at [place] [facility]PI  IOV 05/15/2014  Chief Complaint  Patient presents with  . Pulmonary Consult    Pt referred by Dr. Kevan Ny for dyspnea with activity. Pt c/o mild dry cough.     82 year old obese lady with the multiple medical problems and atrial fibrillation. Referred for dyspnea. She's had a long-standing history with persistent atrial fibrillation. In 2014 was evaluated by a Dr. Velta Addison at [facility] and was status post ablation 2 years ago. However a to fibrillation recurred in 2015 and she's been on  Flecainide for several months along with baseline diltiazem and bysytolic. Then subsequently in late 2015 underwent a second ablation and according to her history it was an incomplete procedure and since then she feels that she's had dyspnea on exertion which she notices for climbing flight of stairs and walking more than 500 feet on level ground. It is relieved by rest. She is concerned it may be related to her flecainide. She does have mild chronic cough with mild postnasal drainage but this is old persistent and chronic and unchanged and is unrelated to her dyspnea. There is no associated wheezing or pedal edema. She had a recent spirometry that suggestive restriction with low DLCO and a chest x-ray that suggested interstitial lung disease changes and this is an added concern and she's been referred here   CT chest 08/23/2005: Clear lung fields but with a large hiatal hernia   CT sinus 12/02/2005: No evidence of sinusitis   Spirometry with DLCO: 05/12/2014: FVC 2.35/72%, FEV1 1.8 L/72% with a ratio of 102%. Suggestive of restriction. DLCO of 18.2/58%. Overall spirometry suggests restriction with low diffusion capacity.  Chest x-ray 04/24/2014: Suggests possible ILD but she also has a hiatal  hernia  ECHO 03/19/14- normal. PASP 40s per report  Walking desaturation test 185 feet 3 laps on room air: She got dyspneic at the second lap but was able to continue and finish the third lap. Heart rate remained in the 70s   REC workup  OV 09/05/2014   Chief Complaint  Patient presents with  . Follow-up    Pt here to f/u after super D CT chest. Pt stated her breathing has improved. Pt c/o mild dry cough which increases when lying down. Pt denies CP/tightness.    Follow-up dyspnea  - Has completed her workup. All documented below.. After Lasix following right heart catheterization dyspnea is 50% better. But she still has 50% residual dyspnea which is present on exertion relieved by rest. She's not interested in a trial of pulmonary rehabilitation or inhaler therapy. She is known to have hiatal hernia that is moderate in size  Follow-up lung nodule right upper lobe  - This is somewhat decreased between February 2016 andMay 2016   Dyspnea workup    - . Hiatal hernia also seen - this is known to her. Moderate in size   - ONO test - ono 2/2/216 revwed - total time </= 88% st 20sec. Basically normal. No need for o2 at night.     - Right heart cath Procedural Findings: Hemodynamics (mmHg) RA mean 10 RV 41/12 PA 41/18, mean 30 PCWP mean 20 with v-waves to 27  Oxygen saturations: PA 82% AO 99%  Cardiac Output (Fick) 8.65  Cardiac Index (Fick)  4.08 PVR 1.15 WU  Cardiac Output (Thermo) 5.3 Cardiac Index (Thermo) 2.5 PVR 1.89 WU  Final Conclusions: Preserved cardiac output (though numbers by Fick and thermodilution were somewhat different). She has mild pulmonary hypertension. I think that this is primarily pulmonary venous hypertension from elevated left atrial pressures (PCWP 20 with v-waves to 27). Her echo showed mild MR, so I suspect the prominent v-waves are due to LV diastolic dysfunction. Right-sided filling pressure is also elevated.   IMPRESSION: CT chest  May 2016  1. A right upper lobe pulmonary nodule measures slightly smaller than on the exam of 05/21/2014. Favored to be due to differences in slice selection. This may have enlarged minimally since 2007, therefore warrants followup attention. 2. Other pulmonary nodules which measure 4 mm or less. If the patient is at high risk for bronchogenic carcinoma, follow-up chest CT at 1 year is recommended. If the patient is at low risk, no follow-up is needed. This recommendation follows the consensus statement: "Guidelines for Management of Small Pulmonary Nodules Detected on CT Scans: A Statement from the Fleischner Society" as published in Radiology 2005; 237:395-400. Available online at: DietDisorder.cz.   Electronically Signed  By: Jeronimo Greaves M.D.  On: 09/02/2014 16:55  OV 06/18/2015 Chief Complaint  Patient presents with  . Follow-up    Pt here after CT from 02/2015. Pt states her SOB has improved since last OV. Pt denies cough and CP/tightness.    follow-up lung nodule multiple with the largest in the right upper lobe in this nonsmoker. CT chest #2016 visualized personally it shows the lung nodules are stable since May 2016 which itself was an improvement. Results-below  Follow dyspnea: This was improved after right heart cath that showed pulmonary hypertension associated with elevated wedge pressure and starting her on Lasix. At last visit she was significant better. Since then she has continued Lasix and now dyspnea is almost resolved. She is highly frustrated by her atrial fibrillation which is failed ablation many times. She is on multiple medications. She has a large associated hiatal hernia.    COMPARISON: 09/02/2014  FINDINGS: The lungs are well aerated bilaterally. No focal infiltrate or sizable effusion is seen. Areas of scarring as well as multiple small pulmonary nodules are again identified and stable from the prior exam.  The largest of these is less well visualized on the current study in the right upper lobe best seen on image number 19 of series 3. It is stable in appearance measuring between 6 and 8 mm depending on the image plane. Multiple smaller nodules are again seen and stable. The thoracic inlet is within normal limits.  Scattered small mediastinal lymph nodes are again identified and stable. No significant hilar adenopathy is noted.  Large hiatal hernia is seen. The upper abdomen is otherwise within normal limits. The osseous structures show degenerative change of the thoracic spine.  IMPRESSION: Multiple pulmonary nodules. The largest of which lies in the right upper lobe and is stable from the prior study. Followup examination in 1 year is recommended to assess for stability.  The remainder of the exam is stable from the prior study.   Electronically Signed  By: Alcide Clever M.D.  On: 03/06/2015 15:17    has a past medical history of GERD (gastroesophageal reflux disease); Hypertension; Seasonal allergies; Lower leg edema; Hypothyroidism; Restless leg syndrome; DJD (degenerative joint disease); Diverticular disease; Dysphagia; Hiatal hernia; PAF (paroxysmal atrial fibrillation) (HCC); Complication of anesthesia; PONV (postoperative nausea and vomiting); Chronic diastolic heart failure (HCC) (04/22/7827);  and Atrial flutter with rapid ventricular response (HCC) (04/22/2015).   reports that she has never smoked. She has never used smokeless tobacco.  Past Surgical History  Procedure Laterality Date  . Rhinoplasty    . Abdominal hysterectomy    . Appendectomy    . Tubal ligation    . Eye surgery      cataracat  . Cardioversion      x3  . Breast surgery      left lumpectomy  . Tonsillectomy  1942  . Thyroidectomy  1973  . Bladder polyps    . Rotator cuff repair    . Esophagogastroduodenoscopy  03/03/2011    Procedure: ESOPHAGOGASTRODUODENOSCOPY (EGD);  Surgeon: Charolett Bumpers, MD;  Location: [facility];  Service: Endoscopy;  Laterality: N/A;  . Balloon dilation  03/03/2011    Procedure: BALLOON DILATION;  Surgeon: Charolett Bumpers, MD;  Location: [facility] ENDOSCOPY;  Service: Endoscopy;  Laterality: N/A;  . Atrial fibrillation ablation  09-06-13    2'14 -[facility]  . Cataract extraction Left   . Colonoscopy with propofol N/A 09/24/2013    Procedure: COLONOSCOPY WITH PROPOFOL;  Surgeon: Charolett Bumpers, MD;  Location: [facility] ENDOSCOPY;  Service: Endoscopy;  Laterality: N/A;  . Right heart catheterization N/A 07/11/2014    Procedure: RIGHT HEART CATH;  Surgeon: Laurey Morale, MD;  Location: [facility] CATH LAB;  Service: Cardiovascular;  Laterality: N/A;  . Cardioversion N/A 12/30/2014    Procedure: CARDIOVERSION;  Surgeon: Lewayne Bunting, MD;  Location[facility] ENDOSCOPY;  Service: Cardiovascular;  Laterality: N/A;  . Cardioversion N/A 04/23/2015    Procedure: CARDIOVERSION;  Surgeon: Thurmon Fair, MD;  Location: [facility] ENDOSCOPY;  Service: Cardiovascular;  Laterality: N/A;    Allergies  Allergen Reactions  . Tramadol Nausea Only  . Codeine Nausea And Vomiting and Nausea Only  . Digoxin And Related     ' Makes my face blood red"  . Erythromycin Other (See Comments) and Hives    Flushing on face  . Other Other (See Comments)    ECG leads cause skin irritation.  Marland Kitchen Penicillins Hives    Has patient had a PCN reaction causing immediate rash, facial/tongue/throat swelling, SOB or lightheadedness with hypotension: No Has patient had a PCN reaction causing severe rash involving mucus membranes or skin necrosis: No Has patient had a PCN reaction that required hospitalization No Has patient had a PCN reaction occurring within the last 10 years: No If all of the above answers are "NO", then may proceed with Cephalosporin use.  . Tetracyclines & Related Other (See Comments)    "Sores on legs"  . Diltiazem Rash and Other (See Comments)    Rash with long acting form  . Sulfites  Rash    PRESERVATIVE OF SOME FOODS- MAKES FLUSHES TO FACE AND NECK  . Tetracycline Rash    Immunization History  Administered Date(s) Administered  . Influenza Split 04/18/2014  . Influenza-Unspecified 12/16/2014  . Pneumococcal Conjugate-13 04/24/2014    Family History  Problem Relation Age of Onset  . Anesthesia problems Neg Hx   . Hypotension Neg Hx   . Malignant hyperthermia Neg Hx   . Pseudochol deficiency Neg Hx   . Transient ischemic attack Mother   . Heart failure Father   . CVA Father   . Lung cancer Father   . Hypertension Sister   . Cancer Brother   . Sudden death Brother   . Heart attack Neg Hx      Current outpatient  prescriptions:  .  acetaminophen (TYLENOL) 500 MG tablet, Take 500 mg by mouth every 6 (six) hours as needed for mild pain or headache., Disp: , Rfl:  .  butalbital-acetaminophen-caffeine (FIORICET WITH CODEINE) 50-325-40-30 MG per capsule, Take 1 capsule by mouth every 4 (four) hours as needed. For migraines, Disp: , Rfl:  .  Calcium-Magnesium-Vitamin D (CITRACAL CALCIUM+D) 600-40-500 MG-MG-UNIT TB24, Take 1 tablet by mouth daily. , Disp: , Rfl:  .  Cholecalciferol (VITAMIN D) 2000 UNITS CAPS, Take 2,000 Units by mouth daily. Reported on 04/22/2015, Disp: , Rfl:  .  clindamycin (CLEOCIN) 300 MG capsule, Take 2 capsules by mouth 2 (two) times daily., Disp: , Rfl:  .  esomeprazole (NEXIUM) 20 MG capsule, Take 20 mg by mouth every other day. , Disp: , Rfl:  .  estradiol (ESTRACE) 1 MG tablet, Take 1 mg by mouth at bedtime. , Disp: , Rfl:  .  furosemide (LASIX) 40 MG tablet, Take 1 tablet (40 mg total) by mouth 2 (two) times daily., Disp: 60 tablet, Rfl: 11 .  hydroxypropyl methylcellulose (ISOPTO TEARS) 2.5 % ophthalmic solution, Place 1 drop into both eyes 3 (three) times daily as needed for dry eyes., Disp: , Rfl:  .  levothyroxine (SYNTHROID, LEVOTHROID) 125 MCG tablet, Take 125 mcg by mouth daily before breakfast. BRAND ONLY, Disp: , Rfl:  .   LORazepam (ATIVAN) 1 MG tablet, Take 0.5-1 mg by mouth at bedtime as needed for sleep. For sleep, Disp: , Rfl:  .  Magnesium 500 MG CAPS, Take 500 mg by mouth daily., Disp: , Rfl:  .  nebivolol (BYSTOLIC) 5 MG tablet, Take 5 mg by mouth. Take one tablet by mouth in the morning and 1/2 tablet in the evening, Disp: , Rfl:  .  potassium chloride (K-DUR,KLOR-CON) 10 MEQ tablet, TAKE 1 TABLET BY MOUTH 2 TIMES DAILY., Disp: 60 tablet, Rfl: 8 .  verapamil (CALAN) 120 MG tablet, Take 120 mg by mouth daily., Disp: , Rfl:  .  warfarin (COUMADIN) 5 MG tablet, Take 2.5-5 mg by mouth daily at 6 PM. 5 mg Sunday, Tuesday, Thursday, 2.5mg  Monday, Wednesday, Friday, Saturday, Disp: , Rfl:       Review of Systems     Objective:   Physical Exam  Constitutional: She is oriented to person, place, and time. She appears well-developed and well-nourished. No distress.  HENT:  Head: Normocephalic and atraumatic.  Right Ear: External ear normal.  Left Ear: External ear normal.  Mouth/Throat: Oropharynx is clear and moist. No oropharyngeal exudate.  Eyes: Conjunctivae and EOM are normal. Pupils are equal, round, and reactive to light. Right eye exhibits no discharge. Left eye exhibits no discharge. No scleral icterus.  Neck: Normal range of motion. Neck supple. No JVD present. No tracheal deviation present. No thyromegaly present.  Cardiovascular: Normal rate, regular rhythm, normal heart sounds and intact distal pulses.  Exam reveals no gallop and no friction rub.   No murmur heard. Pulmonary/Chest: Effort normal and breath sounds normal. No respiratory distress. She has no wheezes. She has no rales. She exhibits no tenderness.  Abdominal: Soft. Bowel sounds are normal. She exhibits no distension and no mass. There is no tenderness. There is no rebound and no guarding.  Musculoskeletal: Normal range of motion. She exhibits no edema or tenderness.  Lymphadenopathy:    She has no cervical adenopathy.   Neurological: She is alert and oriented to person, place, and time. She has normal reflexes. No cranial nerve deficit. She exhibits normal muscle  tone. Coordination normal.  Skin: Skin is warm and dry. No rash noted. She is not diaphoretic. No erythema. No pallor.  Psychiatric: She has a normal mood and affect. Her behavior is normal. Judgment and thought content normal.  Vitals reviewed.  Filed Vitals:   06/18/15 1535  BP: 122/76  Pulse: 74  Height: 5\' 9"  (1.753 m)  Weight: 209 lb 9.6 oz (95.074 kg)  SpO2: 97%       Assessment:       ICD-9-CM ICD-10-CM   1. Nodule of right lung 793.11 R91.1   2. Dyspnea 786.09 R06.00         Plan:      #Nodule of the right lung upper lobe and other pulmonary nodules -Stable since May 2016 and November 2016 - Please do a follow-up CT chest without contrast in November 2017  #Shortness of breath - Glad this is significantly better after Lasix - I will send a message to Dr. Sharrell Ku to see if it'll hiatal hernia is contributing to your difficult to control atrial fibrillation   #Follow-up - In November 2017 after CT chest without contrast    Dr. Kalman Shan, M.D., [facility].C.P Pulmonary and Critical Care Medicine Staff Physician [facility] [place] [facility] Critical [facility] Pager: [phone], If no answer or between  15:00h - 7:00h: call [phone]  06/18/2015 4:15 PM

## 2015-06-18 NOTE — Addendum Note (Signed)
Addended by: Sheran Luz on: 06/18/2015 04:28 PM   Modules accepted: Orders

## 2015-06-18 NOTE — Telephone Encounter (Signed)
Hi [name]  Do yiou think Natasha [name] large hiatal hernia is contributing to her difficult to Rx A Fib due to anatomic pressure effects? Just curious. Anything in literature on that?  THanks  Dr. Kalman Shan, M.D., [facility].C.P Pulmonary and Critical Care Medicine Staff Physician [facility] [place] [facility] and Critical Care Pager: [phone], If no answer or between  15:00h - 7:00h: call [phone]  06/18/2015 4:20 PM

## 2015-06-19 DIAGNOSIS — L508 Other urticaria: Secondary | ICD-10-CM | POA: Diagnosis not present

## 2015-06-22 ENCOUNTER — Ambulatory Visit (INDEPENDENT_AMBULATORY_CARE_PROVIDER_SITE_OTHER): Payer: Medicare Other | Admitting: Pharmacist Clinician (PhC)/ Clinical Pharmacy Specialist

## 2015-06-22 DIAGNOSIS — I4891 Unspecified atrial fibrillation: Secondary | ICD-10-CM | POA: Diagnosis not present

## 2015-06-22 LAB — POCT INR: INR: 2.2

## 2015-06-26 ENCOUNTER — Other Ambulatory Visit: Payer: Self-pay | Admitting: Internal Medicine

## 2015-06-26 DIAGNOSIS — N6489 Other specified disorders of breast: Secondary | ICD-10-CM

## 2015-07-02 ENCOUNTER — Ambulatory Visit: Payer: Medicare Other | Admitting: Cardiology

## 2015-07-09 ENCOUNTER — Ambulatory Visit (INDEPENDENT_AMBULATORY_CARE_PROVIDER_SITE_OTHER): Payer: Medicare Other | Admitting: Cardiology

## 2015-07-09 ENCOUNTER — Encounter: Payer: Self-pay | Admitting: Cardiology

## 2015-07-09 VITALS — BP 116/74 | HR 70 | Ht 69.0 in | Wt 210.0 lb

## 2015-07-09 DIAGNOSIS — I272 Other secondary pulmonary hypertension: Secondary | ICD-10-CM | POA: Diagnosis not present

## 2015-07-09 DIAGNOSIS — I5032 Chronic diastolic (congestive) heart failure: Secondary | ICD-10-CM

## 2015-07-09 DIAGNOSIS — I10 Essential (primary) hypertension: Secondary | ICD-10-CM

## 2015-07-09 DIAGNOSIS — I480 Paroxysmal atrial fibrillation: Secondary | ICD-10-CM

## 2015-07-09 NOTE — Telephone Encounter (Signed)
Nothing that I am aware of. Repair of the hernia might be helpful but will not likely make the atrial fib any better. GT

## 2015-07-09 NOTE — Progress Notes (Signed)
Cardiology Office Note   Date:  07/09/2015   ID:  Natasha [name], DOB 1932-07-30, MRN [id number]  PCP:  Pearla Dubonnet, MD    Chief Complaint  Patient presents with  . Atrial Fibrillation      History of Present Illness: Natasha [name] is a 82 y.o. female who presents for followup of afib.Patient has had 2 ablations at [facility]. Nuclear study September 2015 showed ejection fraction 76% and normal perfusion. Last echocardiogram December 2015 showed normal LV function, mild mitral regurgitation and tricuspid regurgitation. Mildly elevated pulmonary pressures. Patient had right heart catheterization in March 2016 which revealed a pulmonary capillary wedge pressure of 20 with preserved cardiac output. Pulmonary venous hypertension felt secondary to diastolic dysfunction.  She had reoccurrence of afib with RVR and she contacted [facility] and was placed on multaq. However she did not convert and apparently the medication made her feel bad and didn't continue this medication. They told her not to take Cardizem as they felt her blood pressure was too low. When she is not in atrial fibrillation she denies dyspnea, chest pain or syncope. She underwent DCCV on 12/30/14 to NSR. She was seen back in afib clinic and was in atrial flutter.She was seen by Dr. Ladona Ridgel in EP clinic and offerred AVN ablation with PPM vs afib ablation and patient decided to continue Verapamil and bystolic.  She is doing well.  She denies any palpitations, chest pain, SOB, LE edema, dizziness or syncope.  She mainly complains of severe fatigue.     Past Medical History  Diagnosis Date  . GERD (gastroesophageal reflux disease)   . Hypertension   . Seasonal allergies   . Lower leg edema   . Hypothyroidism   . Restless leg syndrome   . DJD (degenerative joint disease)   . Diverticular disease   . Dysphagia   . Hiatal hernia   . PAF (paroxysmal atrial fibrillation) (HCC)     s/p ablation  done and also pill in the pocket flecainide  . Complication of anesthesia   . PONV (postoperative nausea and vomiting)   . Chronic diastolic heart failure (HCC) 04/22/2015  . Atrial flutter with rapid ventricular response (HCC) 04/22/2015    Past Surgical History  Procedure Laterality Date  . Rhinoplasty    . Abdominal hysterectomy    . Appendectomy    . Tubal ligation    . Eye surgery      cataracat  . Cardioversion      x3  . Breast surgery      left lumpectomy  . Tonsillectomy  1942  . Thyroidectomy  1973  . Bladder polyps    . Rotator cuff repair    . Esophagogastroduodenoscopy  03/03/2011    Procedure: ESOPHAGOGASTRODUODENOSCOPY (EGD);  Surgeon: Charolett Bumpers, MD;  Location: [facility];  Service: Endoscopy;  Laterality: N/A;  . Balloon dilation  03/03/2011    Procedure: BALLOON DILATION;  Surgeon: Charolett Bumpers, MD;  Location: [facility] ENDOSCOPY;  Service: Endoscopy;  Laterality: N/A;  . Atrial fibrillation ablation  09-06-13    2'14 -[facility]  . Cataract extraction Left   . Colonoscopy with propofol N/A 09/24/2013    Procedure: COLONOSCOPY WITH PROPOFOL;  Surgeon: Charolett Bumpers, MD;  Location: [facility] ENDOSCOPY;  Service: Endoscopy;  Laterality: N/A;  . Right heart catheterization N/A 07/11/2014    Procedure: RIGHT HEART CATH;  Surgeon: Freida Busman  Alford Highland, MD;  Location: [facility] CATH LAB;  Service: Cardiovascular;  Laterality: N/A;  . Cardioversion N/A 12/30/2014    Procedure: CARDIOVERSION;  Surgeon: Lewayne Bunting, MD;  Location: [facility] ENDOSCOPY;  Service: Cardiovascular;  Laterality: N/A;  . Cardioversion N/A 04/23/2015    Procedure: CARDIOVERSION;  Surgeon: Thurmon Fair, MD;  Location: [facility] ENDOSCOPY;  Service: Cardiovascular;  Laterality: N/A;     Current Outpatient Prescriptions  Medication Sig Dispense Refill  . acetaminophen (TYLENOL) 500 MG tablet Take 500 mg by mouth every 6 (six) hours as needed for mild pain or headache.    . butalbital-acetaminophen-caffeine (FIORICET  WITH CODEINE) 50-325-40-30 MG per capsule Take 1 capsule by mouth every 4 (four) hours as needed. For migraines    . Calcium-Magnesium-Vitamin D (CITRACAL CALCIUM+D) 600-40-500 MG-MG-UNIT TB24 Take 1 tablet by mouth daily.     . Cholecalciferol (VITAMIN D) 2000 UNITS CAPS Take 2,000 Units by mouth daily. Reported on 04/22/2015    . esomeprazole (NEXIUM) 20 MG capsule Take 20 mg by mouth every other day.     . estradiol (ESTRACE) 1 MG tablet Take 1 mg by mouth at bedtime.     . furosemide (LASIX) 40 MG tablet Take 1 tablet (40 mg total) by mouth 2 (two) times daily. 60 tablet 11  . hydroxypropyl methylcellulose (ISOPTO TEARS) 2.5 % ophthalmic solution Place 1 drop into both eyes 3 (three) times daily as needed for dry eyes.    Marland Kitchen levothyroxine (SYNTHROID, LEVOTHROID) 125 MCG tablet Take 125 mcg by mouth daily before breakfast. BRAND ONLY    . LORazepam (ATIVAN) 1 MG tablet Take 0.5-1 mg by mouth at bedtime as needed for sleep. For sleep    . Magnesium 500 MG CAPS Take 500 mg by mouth daily.    . nebivolol (BYSTOLIC) 5 MG tablet Take 5 mg by mouth. Take one tablet by mouth in the morning and 1/2 tablet in the evening    . potassium chloride (K-DUR,KLOR-CON) 10 MEQ tablet TAKE 1 TABLET BY MOUTH 2 TIMES DAILY. 60 tablet 8  . verapamil (CALAN) 120 MG tablet Take 120 mg by mouth daily.    Marland Kitchen warfarin (COUMADIN) 5 MG tablet Take 2.5-5 mg by mouth daily at 6 PM. 5 mg Sunday, Tuesday, Thursday, 2.5mg  Monday, Wednesday, Friday, Saturday     No current facility-administered medications for this visit.    Allergies:   Tramadol; Codeine; Digoxin and related; Erythromycin; Other; Penicillins; Tetracyclines & related; Diltiazem; Sulfites; and Tetracycline    Social History:  The patient  reports that she has never smoked. She has never used smokeless tobacco. She reports that she drinks alcohol. She reports that she does not use illicit drugs.   Family History:  The patient's family history includes CVA in her  father; Cancer in her brother; Heart failure in her father; Hypertension in her sister; Lung cancer in her father; Sudden death in her brother; Transient ischemic attack in her mother. There is no history of Anesthesia problems, Hypotension, Malignant hyperthermia, Pseudochol deficiency, or Heart attack.    ROS:  Please see the history of present illness.   Otherwise, review of systems are positive for none.   All other systems are reviewed and negative.    PHYSICAL EXAM: VS:  BP 116/74 mmHg  Pulse 70  Ht  (1.753 m)  Wt 210 lb (95.255 kg)  BMI 31.00 kg/m2 , BMI Body mass index is 31 kg/(m^2). GEN: Well nourished, well developed, in no acute distress HEENT: normal Neck:  no JVD, carotid bruits, or masses Cardiac: RRR; no murmurs, rubs, or gallops,no edema  Respiratory:  clear to auscultation bilaterally, normal work of breathing GI: soft, nontender, nondistended, + BS MS: no deformity or atrophy Skin: warm and dry, no rash Neuro:  Strength and sensation are intact Psych: euthymic mood, full affect   EKG:  EKG is not ordered today.    Recent Labs: 08/04/2014: Pro B Natriuretic peptide (BNP) 100.0 04/22/2015: BUN 14; Creatinine, Ser 0.84; Magnesium 2.1; Potassium 4.7; Sodium 136; TSH 1.548 04/23/2015: Hemoglobin 14.1; Platelets 252    Lipid Panel No results found for: CHOL, TRIG, HDL, CHOLHDL, VLDL, LDLCALC, LDLDIRECT    Wt Readings from Last 3 Encounters:  07/09/15 210 lb (95.255 kg)  06/18/15 209 lb 9.6 oz (95.074 kg)  05/25/15 210 lb (95.255 kg)     ASSESSMENT AND PLAN:  Pulmonary hypertension As noted, she has mild Pulmonary HTN - primarily pulmonary venous HTN from elevated LA pressures (poss from diastolic dysfunction).   Chronic Diastolic CHF - appears euvolemic Continue Lasix. Last BMET was fine.   AF (paroxysmal atrial fibrillation) s/p recent DCCV to NSR.  She has failed medical therapy with Amiodarone, Multaq, Tikosyn and Flecainide.  She recently was  seen by Dr. Ladona Ridgel and offered AVN ablation vs. Repeat afib ablation but patient declined at present.  She is maintaining NSR on BB and CCB.  Cannot increase BB further due to soft BP and fatigue.  I have had a long talk with her regarding therapeutic options.  At this time I have recommended that she continue current medical management since she is maintaining NSR with only mild fatigue on BB and CCB.  If she has reoccurrence then I would recommend seeing Dr. Ladona Ridgel to see if she would be a candidate for repeat afib ablation and if not then proceed with AVN ablation with PPM.  Essential hypertension Controlled.  Continue BBB/CCB.      Current medicines are reviewed at length with the patient today.  The patient does not have concerns regarding medicines.  The following changes have been made:  no change  Labs/ tests ordered today: See above Assessment and Plan No orders of the defined types were placed in this encounter.     Disposition:   FU with me in 6 months  Signed, Quintella Reichert, MD  07/09/2015 10:22 AM    [facility] [address] [place], [place], [place]  [address] Phone: [phone]; Fax: [phone]

## 2015-07-09 NOTE — Patient Instructions (Signed)

## 2015-07-15 NOTE — Telephone Encounter (Signed)
Called and spoke to pt. Informed her of the recs per MR. Pt verbalized understanding and denied any further questions or concerns at this time.   

## 2015-07-15 NOTE — Telephone Encounter (Signed)
Natasha [name]  Let Ashley Murrain know that Dr Ladona Ridgel thinks that hernia repair might likely not help A Fib. Just passing on - because I told her that I would check with him  Ashley Murrain   Dr. Kalman Shan, M.D., [facility].C.P Pulmonary and Critical Care Medicine Staff Physician [facility] Pager: [phone], If no answer or between  15:00h - 7:00h: call [phone]  07/15/2015 1:55 PM

## 2015-07-20 ENCOUNTER — Ambulatory Visit (INDEPENDENT_AMBULATORY_CARE_PROVIDER_SITE_OTHER): Payer: Medicare Other | Admitting: Pharmacist Clinician (PhC)/ Clinical Pharmacy Specialist

## 2015-07-20 DIAGNOSIS — I4891 Unspecified atrial fibrillation: Secondary | ICD-10-CM

## 2015-07-20 LAB — POCT INR: INR: 2.5

## 2015-07-21 ENCOUNTER — Ambulatory Visit (INDEPENDENT_AMBULATORY_CARE_PROVIDER_SITE_OTHER): Payer: Medicare Other | Admitting: Internal Medicine

## 2015-07-21 ENCOUNTER — Encounter: Payer: Self-pay | Admitting: Internal Medicine

## 2015-07-21 VITALS — BP 134/80 | HR 81 | Ht 69.0 in | Wt 211.0 lb

## 2015-07-21 DIAGNOSIS — I480 Paroxysmal atrial fibrillation: Secondary | ICD-10-CM

## 2015-07-21 NOTE — Progress Notes (Signed)
HPI Natasha [name] returns today for post op followup. She is a pleasant 82 yo woman with atrial fib and a RVR, HTN, who has had 2 atrial fib ablations at [facility]. She has continued to have atrial fib/flutter. She was hospitalized several months ago and underwent DCCV. She returned to atrial fib and was placed on Verapamil in addition to Bystolic.  She appears to be maintaining NSR. She has many questions today regarding her long term treatment. We have discussed the treatment options in detail. She is anxious about amiodarone but fortunately has been maintaining NSR. Allergies  Allergen Reactions  . Tramadol Nausea Only  . Codeine Nausea And Vomiting and Nausea Only  . Digoxin And Related Other (See Comments)    ' Makes my face blood red"  . Erythromycin Other (See Comments) and Hives    Flushing on face  . Other Other (See Comments)    ECG leads cause skin irritation.  Marland Kitchen Penicillins Hives    Has patient had a PCN reaction causing immediate rash, facial/tongue/throat swelling, SOB or lightheadedness with hypotension: No Has patient had a PCN reaction causing severe rash involving mucus membranes or skin necrosis: No Has patient had a PCN reaction that required hospitalization No Has patient had a PCN reaction occurring within the last 10 years: No If all of the above answers are "NO", then may proceed with Cephalosporin use.  . Tetracyclines & Related Other (See Comments)    "Sores on legs"  . Diltiazem Rash and Other (See Comments)    Rash with long acting form  . Sulfites Rash    PRESERVATIVE OF SOME FOODS- MAKES FLUSHES TO FACE AND NECK  . Tetracycline Rash     Current Outpatient Prescriptions  Medication Sig Dispense Refill  . acetaminophen (TYLENOL) 500 MG tablet Take 500 mg by mouth every 6 (six) hours as needed for mild pain or headache.    . butalbital-acetaminophen-caffeine (FIORICET WITH CODEINE) 50-325-40-30 MG per capsule Take 1 capsule by mouth every 4 (four) hours as  needed. For migraines    . Calcium-Magnesium-Vitamin D (CITRACAL CALCIUM+D) 600-40-500 MG-MG-UNIT TB24 Take 1 tablet by mouth daily.     . Cholecalciferol (VITAMIN D) 2000 UNITS CAPS Take 2,000 Units by mouth daily. Reported on 04/22/2015    . esomeprazole (NEXIUM) 20 MG capsule Take 20 mg by mouth every other day.     . estradiol (ESTRACE) 1 MG tablet Take 1 mg by mouth at bedtime.     . furosemide (LASIX) 40 MG tablet Take 1 tablet (40 mg total) by mouth 2 (two) times daily. 60 tablet 11  . levothyroxine (SYNTHROID, LEVOTHROID) 125 MCG tablet Take 125 mcg by mouth daily before breakfast. BRAND ONLY    . LORazepam (ATIVAN) 1 MG tablet Take 0.5-1 mg by mouth at bedtime as needed for sleep. For sleep    . Magnesium 500 MG CAPS Take 500 mg by mouth daily.    . nebivolol (BYSTOLIC) 5 MG tablet Take 5 mg by mouth. Take one tablet by mouth in the morning and 1/2 tablet in the evening    . potassium chloride (K-DUR,KLOR-CON) 10 MEQ tablet TAKE 1 TABLET BY MOUTH 2 TIMES DAILY. 60 tablet 8  . verapamil (CALAN) 120 MG tablet Take 120 mg by mouth daily.    Marland Kitchen warfarin (COUMADIN) 5 MG tablet Take 2.5-5 mg by mouth daily at 6 PM. 5 mg Sunday, Tuesday, Thursday, 2.5mg  Monday, Wednesday, Friday, Saturday     No current  facility-administered medications for this visit.     Past Medical History  Diagnosis Date  . GERD (gastroesophageal reflux disease)   . Hypertension   . Seasonal allergies   . Lower leg edema   . Hypothyroidism   . Restless leg syndrome   . DJD (degenerative joint disease)   . Diverticular disease   . Dysphagia   . Hiatal hernia   . PAF (paroxysmal atrial fibrillation) (HCC)     s/p ablation done and also pill in the pocket flecainide  . Complication of anesthesia   . PONV (postoperative nausea and vomiting)   . Chronic diastolic heart failure (HCC) 04/22/2015  . Atrial flutter with rapid ventricular response (HCC) 04/22/2015    ROS:   All systems reviewed and negative except as  noted in the HPI.   Past Surgical History  Procedure Laterality Date  . Rhinoplasty    . Abdominal hysterectomy    . Appendectomy    . Tubal ligation    . Eye surgery      cataracat  . Cardioversion      x3  . Breast surgery      left lumpectomy  . Tonsillectomy  1942  . Thyroidectomy  1973  . Bladder polyps    . Rotator cuff repair    . Esophagogastroduodenoscopy  03/03/2011    Procedure: ESOPHAGOGASTRODUODENOSCOPY (EGD);  Surgeon: Charolett Bumpers, MD;  Location: [facility] ENDOSCOPY;  Service: Endoscopy;  Laterality: N/A;  . Balloon dilation  03/03/2011    Procedure: BALLOON DILATION;  Surgeon: Charolett Bumpers, MD;  Location: [facility] ENDOSCOPY;  Service: Endoscopy;  Laterality: N/A;  . Atrial fibrillation ablation  09-06-13    2'14 -[facility]  . Cataract extraction Left   . Colonoscopy with propofol N/A 09/24/2013    Procedure: COLONOSCOPY WITH PROPOFOL;  Surgeon: Charolett Bumpers, MD;  Location: [facility] ENDOSCOPY;  Service: Endoscopy;  Laterality: N/A;  . Right heart catheterization N/A 07/11/2014    Procedure: RIGHT HEART CATH;  Surgeon: Laurey Morale, MD;  Location: [facility] CATH LAB;  Service: Cardiovascular;  Laterality: N/A;  . Cardioversion N/A 12/30/2014    Procedure: CARDIOVERSION;  Surgeon: Lewayne Bunting, MD;  Location: [facility] ENDOSCOPY;  Service: Cardiovascular;  Laterality: N/A;  . Cardioversion N/A 04/23/2015    Procedure: CARDIOVERSION;  Surgeon: Thurmon Fair, MD;  Location: [facility] ENDOSCOPY;  Service: Cardiovascular;  Laterality: N/A;     Family History  Problem Relation Age of Onset  . Anesthesia problems Neg Hx   . Hypotension Neg Hx   . Malignant hyperthermia Neg Hx   . Pseudochol deficiency Neg Hx   . Transient ischemic attack Mother   . Heart failure Father   . CVA Father   . Lung cancer Father   . Hypertension Sister   . Cancer Brother   . Sudden death Brother   . Heart attack Neg Hx      Social History   Social History  . Marital Status: Married    Spouse  Name: N/A  . Number of Children: N/A  . Years of Education: N/A   Occupational History  . retired    Social History Main Topics  . Smoking status: Never Smoker   . Smokeless tobacco: Never Used  . Alcohol Use: 0.0 oz/week    0 Standard drinks or equivalent per week     Comment: rare wine  . Drug Use: No  . Sexual Activity: No   Other Topics Concern  . Not on file  Social History Narrative     BP 134/80 mmHg  Pulse 81  Ht  (1.753 m)  Wt 211 lb (95.709 kg)  BMI 31.15 kg/m2  Physical Exam:  Well appearing elderly woman, NAD HEENT: Unremarkable Neck:  6 cmJVD, no thyromegally Lymphatics:  No adenopathy Back:  No CVA tenderness Lungs:  Clear with no wheezes HEART:  Regular rate rhythm, no murmurs, no rubs, no clicks Abd:  soft, positive bowel sounds, no organomegally, no rebound, no guarding Ext:  2 plus pulses, no edema, no cyanosis, no clubbing Skin:  No rashes no nodules Neuro:  CN II through XII intact, motor grossly intact  Assess/Plan:  1. Atrial fib with a RVR - she is s/p 2 ablations and has had continued atrial fib. We again discussed at length her multiple treatment options. For now she will continue her current medical regimen. We discussed amiodarone as well as AV node ablation and PPM insertion as well as another atrial fib ablation. We discussed the treatment options. For now, she will continue her strategy of Verapamil and bystolic. We will see her back in 6 months.   2. HTN heart disease - her blood pressure is well controlled. No change in meds.   3. Constipation - she has had some on verapamil.   Leonia Reeves.D.

## 2015-07-21 NOTE — Patient Instructions (Signed)

## 2015-08-13 ENCOUNTER — Other Ambulatory Visit: Payer: Self-pay | Admitting: Physician Assistant

## 2015-08-17 DIAGNOSIS — I471 Supraventricular tachycardia: Secondary | ICD-10-CM | POA: Diagnosis not present

## 2015-08-17 DIAGNOSIS — I480 Paroxysmal atrial fibrillation: Secondary | ICD-10-CM | POA: Diagnosis not present

## 2015-08-17 DIAGNOSIS — I481 Persistent atrial fibrillation: Secondary | ICD-10-CM | POA: Diagnosis not present

## 2015-08-17 DIAGNOSIS — I440 Atrioventricular block, first degree: Secondary | ICD-10-CM | POA: Diagnosis not present

## 2015-08-18 ENCOUNTER — Other Ambulatory Visit (HOSPITAL_COMMUNITY): Payer: Self-pay | Admitting: *Deleted

## 2015-08-18 MED ORDER — NEBIVOLOL HCL 5 MG PO TABS: ORAL_TABLET | ORAL | Status: DC

## 2015-08-21 DIAGNOSIS — W57XXXA Bitten or stung by nonvenomous insect and other nonvenomous arthropods, initial encounter: Secondary | ICD-10-CM | POA: Diagnosis not present

## 2015-08-21 DIAGNOSIS — M2550 Pain in unspecified joint: Secondary | ICD-10-CM | POA: Diagnosis not present

## 2015-08-31 ENCOUNTER — Ambulatory Visit (INDEPENDENT_AMBULATORY_CARE_PROVIDER_SITE_OTHER): Payer: Medicare Other | Admitting: Pharmacist

## 2015-08-31 DIAGNOSIS — I4891 Unspecified atrial fibrillation: Secondary | ICD-10-CM

## 2015-08-31 LAB — POCT INR: INR: 2.2

## 2015-09-03 DIAGNOSIS — W57XXXA Bitten or stung by nonvenomous insect and other nonvenomous arthropods, initial encounter: Secondary | ICD-10-CM | POA: Diagnosis not present

## 2015-09-03 DIAGNOSIS — M2550 Pain in unspecified joint: Secondary | ICD-10-CM | POA: Diagnosis not present

## 2015-10-12 ENCOUNTER — Ambulatory Visit (INDEPENDENT_AMBULATORY_CARE_PROVIDER_SITE_OTHER): Payer: Medicare Other | Admitting: Pharmacist Clinician (PhC)/ Clinical Pharmacy Specialist

## 2015-10-12 DIAGNOSIS — I4891 Unspecified atrial fibrillation: Secondary | ICD-10-CM | POA: Diagnosis not present

## 2015-10-12 LAB — POCT INR: INR: 2.4

## 2015-10-28 DIAGNOSIS — H2513 Age-related nuclear cataract, bilateral: Secondary | ICD-10-CM | POA: Diagnosis not present

## 2015-10-28 DIAGNOSIS — Z9849 Cataract extraction status, unspecified eye: Secondary | ICD-10-CM | POA: Diagnosis not present

## 2015-10-28 DIAGNOSIS — H2511 Age-related nuclear cataract, right eye: Secondary | ICD-10-CM | POA: Diagnosis not present

## 2015-10-28 DIAGNOSIS — H5201 Hypermetropia, right eye: Secondary | ICD-10-CM | POA: Diagnosis not present

## 2015-10-28 DIAGNOSIS — H52223 Regular astigmatism, bilateral: Secondary | ICD-10-CM | POA: Diagnosis not present

## 2015-10-28 DIAGNOSIS — H524 Presbyopia: Secondary | ICD-10-CM | POA: Diagnosis not present

## 2015-10-28 DIAGNOSIS — Z961 Presence of intraocular lens: Secondary | ICD-10-CM | POA: Diagnosis not present

## 2015-11-02 DIAGNOSIS — H26492 Other secondary cataract, left eye: Secondary | ICD-10-CM | POA: Diagnosis not present

## 2015-11-02 DIAGNOSIS — H43813 Vitreous degeneration, bilateral: Secondary | ICD-10-CM | POA: Diagnosis not present

## 2015-11-02 DIAGNOSIS — H25811 Combined forms of age-related cataract, right eye: Secondary | ICD-10-CM | POA: Diagnosis not present

## 2015-11-02 DIAGNOSIS — H2511 Age-related nuclear cataract, right eye: Secondary | ICD-10-CM | POA: Diagnosis not present

## 2015-11-02 DIAGNOSIS — Z961 Presence of intraocular lens: Secondary | ICD-10-CM | POA: Diagnosis not present

## 2015-11-13 DIAGNOSIS — M2550 Pain in unspecified joint: Secondary | ICD-10-CM | POA: Diagnosis not present

## 2015-11-19 DIAGNOSIS — E039 Hypothyroidism, unspecified: Secondary | ICD-10-CM | POA: Diagnosis not present

## 2015-11-23 ENCOUNTER — Ambulatory Visit (INDEPENDENT_AMBULATORY_CARE_PROVIDER_SITE_OTHER): Payer: Medicare Other | Admitting: Pharmacist Clinician (PhC)/ Clinical Pharmacy Specialist

## 2015-11-23 DIAGNOSIS — I4891 Unspecified atrial fibrillation: Secondary | ICD-10-CM | POA: Diagnosis not present

## 2015-11-23 DIAGNOSIS — E063 Autoimmune thyroiditis: Secondary | ICD-10-CM | POA: Diagnosis not present

## 2015-11-23 DIAGNOSIS — E039 Hypothyroidism, unspecified: Secondary | ICD-10-CM | POA: Diagnosis not present

## 2015-11-23 LAB — POCT INR: INR: 3

## 2015-11-24 ENCOUNTER — Telehealth: Payer: Self-pay | Admitting: Internal Medicine

## 2015-11-24 DIAGNOSIS — R911 Solitary pulmonary nodule: Secondary | ICD-10-CM

## 2015-11-24 NOTE — Telephone Encounter (Signed)
Per 06/18/15 OV: #Follow-up - In November 2017 after CT chest without contrast ---   Order is already in [facility] for CT in November.  Pt scheduled appt with MR 11/21 at 1:45. Please advise [facility] regarding CT appt thanks

## 2015-11-24 NOTE — Telephone Encounter (Signed)
We call the pt;s 1 month ahead of the ct she will be called in October and we can set it up close to the appt on on same day Natasha [name]

## 2015-11-24 NOTE — Telephone Encounter (Signed)
Spoke with pt, aware of recs.  Nothing further needed at this time.  

## 2015-12-03 DIAGNOSIS — H2511 Age-related nuclear cataract, right eye: Secondary | ICD-10-CM | POA: Diagnosis not present

## 2015-12-09 ENCOUNTER — Ambulatory Visit
Admission: RE | Admit: 2015-12-09 | Discharge: 2015-12-09 | Disposition: A | Discharge status: Home / Self Care | Payer: Medicare Other | Source: Ambulatory Visit | Attending: Internal Medicine | Admitting: Internal Medicine

## 2015-12-09 ENCOUNTER — Other Ambulatory Visit: Payer: Medicare Other

## 2015-12-09 DIAGNOSIS — N6489 Other specified disorders of breast: Secondary | ICD-10-CM

## 2015-12-09 DIAGNOSIS — R928 Other abnormal and inconclusive findings on diagnostic imaging of breast: Secondary | ICD-10-CM | POA: Diagnosis not present

## 2016-01-04 ENCOUNTER — Ambulatory Visit (INDEPENDENT_AMBULATORY_CARE_PROVIDER_SITE_OTHER): Payer: Medicare Other | Admitting: Pharmacist Clinician (PhC)/ Clinical Pharmacy Specialist

## 2016-01-04 DIAGNOSIS — I4891 Unspecified atrial fibrillation: Secondary | ICD-10-CM | POA: Diagnosis not present

## 2016-01-04 LAB — POCT INR: INR: 3.4

## 2016-01-05 ENCOUNTER — Other Ambulatory Visit: Payer: Self-pay | Admitting: Cardiology

## 2016-01-12 ENCOUNTER — Encounter: Payer: Self-pay | Admitting: Cardiology

## 2016-01-12 ENCOUNTER — Ambulatory Visit (INDEPENDENT_AMBULATORY_CARE_PROVIDER_SITE_OTHER): Payer: Medicare Other | Admitting: Cardiology

## 2016-01-12 VITALS — BP 124/78 | HR 65 | Ht 69.0 in | Wt 209.1 lb

## 2016-01-12 DIAGNOSIS — I272 Other secondary pulmonary hypertension: Secondary | ICD-10-CM | POA: Diagnosis not present

## 2016-01-12 DIAGNOSIS — I10 Essential (primary) hypertension: Secondary | ICD-10-CM | POA: Diagnosis not present

## 2016-01-12 DIAGNOSIS — I480 Paroxysmal atrial fibrillation: Secondary | ICD-10-CM

## 2016-01-12 DIAGNOSIS — I5032 Chronic diastolic (congestive) heart failure: Secondary | ICD-10-CM | POA: Diagnosis not present

## 2016-01-12 DIAGNOSIS — Z23 Encounter for immunization: Secondary | ICD-10-CM | POA: Diagnosis not present

## 2016-01-12 LAB — BASIC METABOLIC PANEL
BUN: 13 mg/dL (ref 7–25)
CALCIUM: 8.8 mg/dL (ref 8.6–10.4)
CHLORIDE: 103 mmol/L (ref 98–110)
CO2: 27 mmol/L (ref 20–31)
Creat: 0.93 mg/dL — ABNORMAL HIGH (ref 0.60–0.88)
GLUCOSE: 89 mg/dL (ref 65–99)
Potassium: 4.1 mmol/L (ref 3.5–5.3)
SODIUM: 138 mmol/L (ref 135–146)

## 2016-01-12 NOTE — Progress Notes (Signed)
Cardiology Office Note    Date:  01/12/2016   ID:  [name], DOB 03/11/1933, MRN [id number]  PCP:  Pearla Dubonnet, MD  Cardiologist:  Armanda Magic, MD   Chief Complaint  Patient presents with  . Atrial Fibrillation  . Congestive Heart Failure  . Hypertension    History of Present Illness:  Natasha [name] is a 82 y.o. female  who presents for followup of afib.Patient has had 2 ablations at [facility]. Nuclear study September 2015 showed ejection fraction 76% and normal perfusion. Last echocardiogram December 2015 showed normal LV function, mild mitral regurgitation and tricuspid regurgitation. Mildly elevated pulmonary pressures. Patient had right heart catheterization in March 2016 which revealed a pulmonary capillary wedge pressure of 20 with preserved cardiac output. Pulmonary venous hypertension felt secondary to diastolic dysfunction.   She had reoccurrence of afib with RVR and she contacted [facility] and was placed on multaq. However she did not convert and apparently the medication made her feel bad and didn't continue this medication. She has failed sotolol/dofetilide/flecainide/propafenone/Amio and dronedarone either with not controlling her PAF or having side effects. When she is not in atrial fibrillation she denies dyspnea, chest pain or syncope. She underwent DCCV on 12/30/14 to NSR. She was seen back in afib clinic and was in atrial flutter.She was seen by Dr. Ladona Ridgel in EP clinic and offerred AVN ablation with PPM vs afib ablation and patient decided to continue Verapamil and bystolic.    She pereents back today and is doing well.  She still goes in and out of PAF.  She says that the longest she has been in afib has been 5 hours.  She denies any palpitations, chest pain,  LE edema, dizziness or syncope.  She has some DOE when walking long distances.  She mainly complains of fatigue due to her meds.    Past Medical History:  Diagnosis Date  . Chronic  diastolic heart failure (HCC) 04/22/2015  . Complication of anesthesia   . Diverticular disease   . DJD (degenerative joint disease)   . Dysphagia   . GERD (gastroesophageal reflux disease)   . Hiatal hernia   . Hypertension   . Hypothyroidism   . Lower leg edema   . PAF (paroxysmal atrial fibrillation) (HCC)    s/p ablation x several times now with occasional episodes of PAF.   She has failed sotolol/dofetilide/flecainide/propafenone/Amio and dronedarone either with not controlling her PAF or having side effects  . PONV (postoperative nausea and vomiting)   . Restless leg syndrome   . Seasonal allergies     Past Surgical History:  Procedure Laterality Date  . ABDOMINAL HYSTERECTOMY    . APPENDECTOMY    . ATRIAL FIBRILLATION ABLATION  09-06-13   2'14 -[facility]  . BALLOON DILATION  03/03/2011   Procedure: BALLOON DILATION;  Surgeon: Charolett Bumpers, MD;  Location: [facility]COPY;  Service: Endoscopy;  Laterality: N/A;  . bladder polyps    . BREAST SURGERY     left lumpectomy  . CARDIOVERSION     x3  . CARDIOVERSION N/A 12/30/2014   Procedure: CARDIOVERSION;  Surgeon: Lewayne Bunting, MD;  Location: [facility] ENDOSCOPY;  Service: Cardiovascular;  Laterality: N/A;  . CARDIOVERSION N/A 04/23/2015   Procedure: CARDIOVERSION;  Surgeon: Thurmon Fair, MD;  Location: [facility] ENDOSCOPY;  Service: Cardiovascular;  Laterality: N/A;  . CATARACT EXTRACTION Left   . COLONOSCOPY WITH PROPOFOL N/A 09/24/2013   Procedure: COLONOSCOPY WITH PROPOFOL;  Surgeon: Charolett Bumpers,  MD;  Location: [facility] ENDOSCOPY;  Service: Endoscopy;  Laterality: N/A;  . ESOPHAGOGASTRODUODENOSCOPY  03/03/2011   Procedure: ESOPHAGOGASTRODUODENOSCOPY (EGD);  Surgeon: Charolett BumpersMartin K Johnson, MD;  [facility];  Service: Endoscopy;  Laterality: N/A;  . EYE SURGERY     cataracat  . RHINOPLASTY    . RIGHT HEART CATHETERIZATION N/A 07/11/2014   Procedure: RIGHT HEART CATH;  Surgeon: Laurey Moralealton S McLean, MD;  [facility];   Service: Cardiovascular;  Laterality: N/A;  . ROTATOR CUFF REPAIR    . THYROIDECTOMY  1973  . TONSILLECTOMY  1942  . TUBAL LIGATION      Current Medications: Outpatient Medications Prior to Visit  Medication Sig Dispense Refill  . acetaminophen (TYLENOL) 500 MG tablet Take 500 mg by mouth every 6 (six) hours as needed for mild pain or headache.    . butalbital-acetaminophen-caffeine (FIORICET WITH CODEINE) 50-325-40-30 MG per capsule Take 1 capsule by mouth every 4 (four) hours as needed. For migraines    . Calcium-Magnesium-Vitamin D (CITRACAL CALCIUM+D) 600-40-500 MG-MG-UNIT TB24 Take 1 tablet by mouth daily.     . Cholecalciferol (VITAMIN D) 2000 UNITS CAPS Take 2,000 Units by mouth daily. Reported on 04/22/2015    . esomeprazole (NEXIUM) 20 MG capsule Take 20 mg by mouth every other day.     . estradiol (ESTRACE) 1 MG tablet Take 1 mg by mouth at bedtime.     . furosemide (LASIX) 40 MG tablet Take 1 tablet (40 mg total) by mouth 2 (two) times daily. 60 tablet 11  . levothyroxine (SYNTHROID, LEVOTHROID) 125 MCG tablet Take 125 mcg by mouth daily before breakfast. BRAND ONLY    . LORazepam (ATIVAN) 1 MG tablet Take 0.5-1 mg by mouth at bedtime as needed for sleep. For sleep    . Magnesium 500 MG CAPS Take 500 mg by mouth daily.    . nebivolol (BYSTOLIC) 5 MG tablet Take one tablet by mouth in the morning and 1/2 tablet in the evening 45 tablet 6  . potassium chloride (K-DUR) 10 MEQ tablet Take 1 tablet (10 mEq total) by mouth 2 (two) times daily. 60 tablet 11  . verapamil (CALAN-SR) 120 MG CR tablet TAKE 1 TABLET (120 MG TOTAL) BY MOUTH AT BEDTIME. 30 tablet 5  . warfarin (COUMADIN) 5 MG tablet Take 2.5-5 mg by mouth daily at 6 PM. 5 mg Sunday, Tuesday, Thursday, 2.5mg  Monday, Wednesday, Friday, Saturday    . verapamil (CALAN) 120 MG tablet Take 120 mg by mouth daily.     No facility-administered medications prior to visit.      Allergies:   Codeine; Penicillins; Tetracycline; Tramadol;  Digoxin; Digoxin and related; Erythromycin; Other; Sulfa antibiotics; Tetracyclines & related; Diltiazem; Diltiazem hcl; and Sulfites   Social History   Social History  . Marital status: Married    Spouse name: N/A  . Number of children: N/A  . Years of education: N/A   Occupational History  . retired    Social History Main Topics  . Smoking status: Never Smoker  . Smokeless tobacco: Never Used  . Alcohol use 0.0 oz/week     Comment: rare wine  . Drug use: No  . Sexual activity: No   Other Topics Concern  . None   Social History Narrative  . None     Family History:  The patient's family history includes CVA in her father; Cancer in her brother; Heart failure in her father; Hypertension in her sister; Lung cancer in her father; Sudden death  in her brother; Transient ischemic attack in her mother.   ROS:   Please see the history of present illness.    ROS All other systems reviewed and are negative.  No flowsheet data found.     PHYSICAL EXAM:   VS:  BP 124/78   Pulse 65   Ht 5\' 9"  (1.753 m)   Wt 209 lb 1.9 oz (94.9 kg)   SpO2 94%   BMI 30.88 kg/m    GEN: Well nourished, well developed, in no acute distress  HEENT: normal  Neck: no JVD, carotid bruits, or masses Cardiac: RRR; no murmurs, rubs, or gallops,no edema.  Intact distal pulses bilaterally.  Respiratory:  clear to auscultation bilaterally, normal work of breathing GI: soft, nontender, nondistended, + BS MS: no deformity or atrophy  Skin: warm and dry, no rash Neuro:  Alert and Oriented x 3, Strength and sensation are intact Psych: euthymic mood, full affect  Wt Readings from Last 3 Encounters:  01/12/16 209 lb 1.9 oz (94.9 kg)  07/21/15 211 lb (95.7 kg)  07/09/15 210 lb (95.3 kg)      Studies/Labs Reviewed:   EKG:  EKG is not ordered today.    Recent Labs: 04/22/2015: BUN 14; Creatinine, Ser 0.84; Magnesium 2.1; Potassium 4.7; Sodium 136; TSH 1.548 04/23/2015: Hemoglobin 14.1; Platelets 252    Lipid Panel No results found for: CHOL, TRIG, HDL, CHOLHDL, VLDL, LDLCALC, LDLDIRECT  Additional studies/ records that were reviewed today include:  none    ASSESSMENT:    1. AF (paroxysmal atrial fibrillation) (HCC)   2. Chronic diastolic heart failure (HCC)   3. Essential hypertension   4. Pulmonary hypertension (HCC)      PLAN:  In order of problems listed above:  1. PAF - maintaining NSR on CCB/BB.  Continue warfarin. For now will continue with medical therapy.  She did not want to pursue AVN ablation.  She has failed or had side effects to all antiarrhythmic agents.   2. Chronic diastolic CHF - she appears euvolemic on exam today and her weight is down 2lbs. Continue Lasix/BB.  Check BMET. 3. HTN - BP controlled on current meds.Continue BB and CCB. 4. Pulmonary HTN - Group 2 secondary to pulmonary venous hypertension from diastolic dysfunction.     Medication Adjustments/Labs and Tests Ordered: Current medicines are reviewed at length with the patient today.  Concerns regarding medicines are outlined above.  Medication changes, Labs and Tests ordered today are listed in the Patient Instructions below.  There are no Patient Instructions on file for this visit.   Signed, Armanda Magic, MD  01/12/2016 10:52 AM    [facility] [address] [place], [place], [place]  [address] Phone: [phone]; Fax: [phone]

## 2016-01-12 NOTE — Patient Instructions (Signed)

## 2016-01-25 ENCOUNTER — Ambulatory Visit (INDEPENDENT_AMBULATORY_CARE_PROVIDER_SITE_OTHER): Payer: Medicare Other | Admitting: Pharmacist Clinician (PhC)/ Clinical Pharmacy Specialist

## 2016-01-25 DIAGNOSIS — I4891 Unspecified atrial fibrillation: Secondary | ICD-10-CM

## 2016-01-25 LAB — POCT INR: INR: 2.4

## 2016-02-09 ENCOUNTER — Other Ambulatory Visit: Payer: Self-pay | Admitting: Internal Medicine

## 2016-02-15 ENCOUNTER — Ambulatory Visit (INDEPENDENT_AMBULATORY_CARE_PROVIDER_SITE_OTHER): Payer: Medicare Other | Admitting: Pharmacist Clinician (PhC)/ Clinical Pharmacy Specialist

## 2016-02-15 DIAGNOSIS — I4891 Unspecified atrial fibrillation: Secondary | ICD-10-CM

## 2016-02-15 LAB — POCT INR: INR: 1.9

## 2016-02-26 ENCOUNTER — Telehealth: Payer: Self-pay | Admitting: Pharmacist

## 2016-02-26 NOTE — Telephone Encounter (Signed)
LMTCB about below  Pt left message that dental procedure will be on 03/16/16 instead of next year. She will need to be off coumadin for 5 days prior.   Ok to hold 5 days prior to procedure for Afib with CHADS<4.   Last dose on 03/11/16.

## 2016-02-26 NOTE — Telephone Encounter (Signed)
Spoke to patient advised that last dose on 03/10/16 (not 24th as listed below). Resume at usual dose evening of procedure unless otherwise directed by doctor doing procedure. INR check rescheduled for 2 weeks post procedure.    Pt states appreciation.

## 2016-03-04 ENCOUNTER — Encounter: Payer: Self-pay | Admitting: Internal Medicine

## 2016-03-04 ENCOUNTER — Ambulatory Visit (INDEPENDENT_AMBULATORY_CARE_PROVIDER_SITE_OTHER)
Admission: RE | Admit: 2016-03-04 | Discharge: 2016-03-04 | Disposition: A | Discharge status: Home / Self Care | Payer: Medicare Other | Source: Ambulatory Visit | Attending: Internal Medicine | Admitting: Internal Medicine

## 2016-03-04 ENCOUNTER — Ambulatory Visit (INDEPENDENT_AMBULATORY_CARE_PROVIDER_SITE_OTHER): Payer: Medicare Other | Admitting: Internal Medicine

## 2016-03-04 VITALS — BP 140/84 | HR 64 | Ht 69.0 in | Wt 210.2 lb

## 2016-03-04 DIAGNOSIS — I480 Paroxysmal atrial fibrillation: Secondary | ICD-10-CM

## 2016-03-04 DIAGNOSIS — R0600 Dyspnea, unspecified: Secondary | ICD-10-CM | POA: Diagnosis not present

## 2016-03-04 DIAGNOSIS — R911 Solitary pulmonary nodule: Secondary | ICD-10-CM | POA: Diagnosis not present

## 2016-03-04 NOTE — Patient Instructions (Addendum)

## 2016-03-04 NOTE — Progress Notes (Signed)
HPI Natasha [name] returns today for post op followup. She is a pleasant 82 yo woman with atrial fib and a RVR, HTN, who has had 2 atrial fib ablations [facility]. She has continued to have atrial fib/flutter. She was hospitalized several months ago and underwent DCCV. She returned to atrial fib and was placed on Verapamil in addition to Bystolic.  She appears to be maintaining NSR most of the time. She notes that she has had as much as 5 hours of atrial fib at a time. She has also had constipation.  Allergies  Allergen Reactions  . Codeine Nausea And Vomiting and Nausea Only  . Penicillins Hives    Has patient had a PCN reaction causing immediate rash, facial/tongue/throat swelling, SOB or lightheadedness with hypotension: No Has patient had a PCN reaction causing severe rash involving mucus membranes or skin necrosis: No Has patient had a PCN reaction that required hospitalization No Has patient had a PCN reaction occurring within the last 10 years: No If all of the above answers are "NO", then may proceed with Cephalosporin use.  . Tetracycline Rash  . Tramadol Nausea Only  . Digoxin Other (See Comments)    Flushed neck and face  . Digoxin And Related Other (See Comments)    ' Makes my face blood red"  . Erythromycin Other (See Comments) and Hives    Flushing on face  . Other Other (See Comments)    ECG leads cause skin irritation. ECG leads cause skin irritation.  . Sulfa Antibiotics Other (See Comments)    PRESERVATIVE OF SOME FOODS- MAKES FLUSHES TO FACE AND NECK  . Tetracyclines & Related Other (See Comments)    "Sores on legs"  . Diltiazem Rash and Other (See Comments)    Rash with long acting form  . Diltiazem Hcl Rash    Rash with long acting form  . Sulfites Rash    PRESERVATIVE OF SOME FOODS- MAKES FLUSHES TO FACE AND NECK PRESERVATIVE OF SOME FOODS- MAKES FLUSHES TO FACE AND NECK     Current Outpatient Prescriptions  Medication Sig Dispense Refill  .  acetaminophen (TYLENOL) 500 MG tablet Take 500 mg by mouth every 6 (six) hours as needed for mild pain or headache.    . butalbital-acetaminophen-caffeine (FIORICET WITH CODEINE) 50-325-40-30 MG per capsule Take 1 capsule by mouth every 4 (four) hours as needed. For migraines    . Calcium-Magnesium-Vitamin D (CITRACAL CALCIUM+D) 600-40-500 MG-MG-UNIT TB24 Take 1 tablet by mouth daily.     . Cholecalciferol (VITAMIN D) 2000 UNITS CAPS Take 2,000 Units by mouth daily. Reported on 04/22/2015    . esomeprazole (NEXIUM) 20 MG capsule Take 20 mg by mouth every other day.     . estradiol (ESTRACE) 1 MG tablet Take 1 mg by mouth at bedtime.     . furosemide (LASIX) 40 MG tablet Take 1 tablet (40 mg total) by mouth 2 (two) times daily. 60 tablet 11  . levothyroxine (SYNTHROID, LEVOTHROID) 125 MCG tablet Take 125 mcg by mouth daily before breakfast. BRAND ONLY    . LORazepam (ATIVAN) 1 MG tablet Take 0.5-1 mg by mouth at bedtime as needed for sleep. For sleep    . Magnesium 500 MG CAPS Take 500 mg by mouth daily.    . nebivolol (BYSTOLIC) 5 MG tablet Take one tablet by mouth in the morning and 1/2 tablet in the evening 45 tablet 6  . potassium chloride (K-DUR) 10 MEQ tablet Take 1 tablet (  10 mEq total) by mouth 2 (two) times daily. 60 tablet 11  . verapamil (CALAN-SR) 120 MG CR tablet TAKE 1 TABLET (120 MG TOTAL) BY MOUTH AT BEDTIME. 30 tablet 9  . warfarin (COUMADIN) 5 MG tablet Take 2.5-5 mg by mouth daily at 6 PM. 5 mg Sunday, Tuesday, Thursday, 2.5mg  Monday, Wednesday, Friday, Saturday     No current facility-administered medications for this visit.      Past Medical History:  Diagnosis Date  . Chronic diastolic heart failure (HCC) 04/22/2015  . Complication of anesthesia   . Diverticular disease   . DJD (degenerative joint disease)   . Dysphagia   . GERD (gastroesophageal reflux disease)   . Hiatal hernia   . Hypertension   . Hypothyroidism   . Lower leg edema   . PAF (paroxysmal atrial  fibrillation) (HCC)    s/p ablation x several times now with occasional episodes of PAF.   She has failed sotolol/dofetilide/flecainide/propafenone/Amio and dronedarone either with not controlling her PAF or having side effects  . PONV (postoperative nausea and vomiting)   . Restless leg syndrome   . Seasonal allergies     ROS:   All systems reviewed and negative except as noted in the HPI.   Past Surgical History:  Procedure Laterality Date  . ABDOMINAL HYSTERECTOMY    . APPENDECTOMY    . ATRIAL FIBRILLATION ABLATION  09-06-13   2'14 -[facility]  . BALLOON DILATION  03/03/2011   Procedure: BALLOON DILATION;  Surgeon: Charolett Bumpers, MD;  Location: [facility] ENDOSCOPY;  Service: Endoscopy;  Laterality: N/A;  . bladder polyps    . BREAST SURGERY     left lumpectomy  . CARDIOVERSION     x3  . CARDIOVERSION N/A 12/30/2014   Procedure: CARDIOVERSION;  Surgeon: Lewayne Bunting, MD;  Location: [facility] ENDOSCOPY;  Service: Cardiovascular;  Laterality: N/A;  . CARDIOVERSION N/A 04/23/2015   Procedure: CARDIOVERSION;  Surgeon: Thurmon Fair, MD;  Location: [facility] ENDOSCOPY;  Service: Cardiovascular;  Laterality: N/A;  . CATARACT EXTRACTION Left   . COLONOSCOPY WITH PROPOFOL N/A 09/24/2013   Procedure: COLONOSCOPY WITH PROPOFOL;  Surgeon: Charolett Bumpers, MD;  Location: [facility] ENDOSCOPY;  Service: Endoscopy;  Laterality: N/A;  . ESOPHAGOGASTRODUODENOSCOPY  03/03/2011   Procedure: ESOPHAGOGASTRODUODENOSCOPY (EGD);  Surgeon: Charolett Bumpers, MD;  Location: [facility]OSCOPY;  Service: Endoscopy;  Laterality: N/A;  . EYE SURGERY     cataracat  . RHINOPLASTY    . RIGHT HEART CATHETERIZATION N/A 07/11/2014   Procedure: RIGHT HEART CATH;  Surgeon: Laurey Morale, MD;  Location: [facility] CATH LAB;  Service: Cardiovascular;  Laterality: N/A;  . ROTATOR CUFF REPAIR    . THYROIDECTOMY  1973  . TONSILLECTOMY  1942  . TUBAL LIGATION       Family History  Problem Relation Age of Onset  . Transient ischemic attack  Mother   . Heart failure Father   . CVA Father   . Lung cancer Father   . Hypertension Sister   . Cancer Brother   . Sudden death Brother   . Anesthesia problems Neg Hx   . Hypotension Neg Hx   . Malignant hyperthermia Neg Hx   . Pseudochol deficiency Neg Hx   . Heart attack Neg Hx      Social History   Social History  . Marital status: Married    Spouse name: N/A  . Number of children: N/A  . Years of education: N/A   Occupational History  . retired  Social History Main Topics  . Smoking status: Never Smoker  . Smokeless tobacco: Never Used  . Alcohol use 0.0 oz/week     Comment: rare wine  . Drug use: No  . Sexual activity: No   Other Topics Concern  . Not on file   Social History Narrative  . No narrative on file     BP 140/84   Pulse 64   Ht 5\' 9"  (1.753 m)   Wt 210 lb 3.2 oz (95.3 kg)   SpO2 97%   BMI 31.04 kg/m   Physical Exam:  Well appearing overweight elderly woman, NAD HEENT: Unremarkable Neck:  6 cmJVD, no thyromegally Lymphatics:  No adenopathy Back:  No CVA tenderness Lungs:  Clear with no wheezes HEART:  Regular rate rhythm, no murmurs, no rubs, no clicks Abd:  soft, positive bowel sounds, no organomegally, no rebound, no guarding Ext:  2 plus pulses, no edema, no cyanosis, no clubbing Skin:  No rashes no nodules Neuro:  CN II through XII intact, motor grossly intact  NSR with first degree AV block  Assess/Plan:  1. Atrial fib with a RVR - she is s/p 2 ablations and has had continued atrial fib. We again discussed at length her multiple treatment options. For now she will continue her current medical regimen.  We will see her back in 6 months.   2. HTN heart disease - her blood pressure is well controlled. No change in meds.   3. Constipation - she has had some on verapamil. We discussed stool softeners 4. Pulmonary HTN - her symptoms are well controlled. Will follow.  Leonia Reeves.D.

## 2016-03-07 ENCOUNTER — Other Ambulatory Visit (HOSPITAL_COMMUNITY): Payer: Self-pay | Admitting: Nurse Practitioner

## 2016-03-08 ENCOUNTER — Ambulatory Visit: Payer: Medicare Other | Admitting: Internal Medicine

## 2016-03-14 DIAGNOSIS — I481 Persistent atrial fibrillation: Secondary | ICD-10-CM | POA: Diagnosis not present

## 2016-03-14 DIAGNOSIS — I480 Paroxysmal atrial fibrillation: Secondary | ICD-10-CM | POA: Diagnosis not present

## 2016-03-23 ENCOUNTER — Ambulatory Visit (INDEPENDENT_AMBULATORY_CARE_PROVIDER_SITE_OTHER): Payer: Medicare Other | Admitting: Internal Medicine

## 2016-03-23 ENCOUNTER — Encounter: Payer: Self-pay | Admitting: Internal Medicine

## 2016-03-23 VITALS — BP 114/70 | HR 66 | Ht 69.0 in | Wt 209.5 lb

## 2016-03-23 DIAGNOSIS — R911 Solitary pulmonary nodule: Secondary | ICD-10-CM | POA: Diagnosis not present

## 2016-03-23 NOTE — Addendum Note (Signed)
Addended by: Sheran Luz on: 03/23/2016 02:29 PM   Modules accepted: Orders

## 2016-03-23 NOTE — Progress Notes (Signed)
Subjective:     Patient ID: Natasha [name], female   DOB: [birth year], 82 y.o.   MRN: [id number]  HPI    PCP Pearla Dubonnet, MD Cards: Dr Armanda Magic EP: Dr Velta Addison at [place] HPI  IOV 05/15/2014  Chief Complaint  Patient presents with  . Pulmonary Consult    Pt referred by Dr. Kevan Ny for dyspnea with activity. Pt c/o mild dry cough.     82 year old obese lady with the multiple medical problems and atrial fibrillation. Referred for dyspnea. She's had a long-standing history with persistent atrial fibrillation. In 2014 was evaluated by a Dr. Velta Addison at [facility] and was status post ablation 2 years ago. However a to fibrillation recurred in 2015 and she's been on  Flecainide for several months along with baseline diltiazem and bysytolic. Then subsequently in late 2015 underwent a second ablation and according to her history it was an incomplete procedure and since then she feels that she's had dyspnea on exertion which she notices for climbing flight of stairs and walking more than 500 feet on level ground. It is relieved by rest. She is concerned it may be related to her flecainide. She does have mild chronic cough with mild postnasal drainage but this is old persistent and chronic and unchanged and is unrelated to her dyspnea. There is no associated wheezing or pedal edema. She had a recent spirometry that suggestive restriction with low DLCO and a chest x-ray that suggested interstitial lung disease changes and this is an added concern and she's been referred here   CT chest 08/23/2005: Clear lung fields but with a large hiatal hernia   CT sinus 12/02/2005: No evidence of sinusitis   Spirometry with DLCO: 05/12/2014: FVC 2.35/72%, FEV1 1.8 L/72% with a ratio of 102%. Suggestive of restriction. DLCO of 18.2/58%. Overall spirometry suggests restriction with low diffusion capacity.  Chest x-ray 04/24/2014: Suggests possible ILD but she also has a hiatal hernia  ECHO 03/19/14- normal.  PASP 40s per report  Walking desaturation test 185 feet 3 laps on room air: She got dyspneic at the second lap but was able to continue and finish the third lap. Heart rate remained in the 70s   REC workup  OV 09/05/2014   Chief Complaint  Patient presents with  . Follow-up    Pt here to f/u after super D CT chest. Pt stated her breathing has improved. Pt c/o mild dry cough which increases when lying down. Pt denies CP/tightness.    Follow-up dyspnea  - Has completed her workup. All documented below.. After Lasix following right heart catheterization dyspnea is 50% better. But she still has 50% residual dyspnea which is present on exertion relieved by rest. She's not interested in a trial of pulmonary rehabilitation or inhaler therapy. She is known to have hiatal hernia that is moderate in size  Follow-up lung nodule right upper lobe  - This is somewhat decreased between February 2016 andMay 2016   Dyspnea workup    - . Hiatal hernia also seen - this is known to her. Moderate in size   - ONO test - ono 2/2/216 revwed - total time </= 88% st 20sec. Basically normal. No need for o2 at night.     - Right heart cath Procedural Findings: Hemodynamics (mmHg) RA mean 10 RV 41/12 PA 41/18, mean 30 PCWP mean 20 with v-waves to 27  Oxygen saturations: PA 82% AO 99%  Cardiac Output (Fick) 8.65  Cardiac Index (Fick) 4.08 PVR 1.15  WU  Cardiac Output (Thermo) 5.3 Cardiac Index (Thermo) 2.5 PVR 1.89 WU  Final Conclusions: Preserved cardiac output (though numbers by Fick and thermodilution were somewhat different). She has mild pulmonary hypertension. I think that this is primarily pulmonary venous hypertension from elevated left atrial pressures (PCWP 20 with v-waves to 27). Her echo showed mild MR, so I suspect the prominent v-waves are due to LV diastolic dysfunction. Right-sided filling pressure is also elevated.   IMPRESSION: CT chest May 2016  1. A right upper  lobe pulmonary nodule measures slightly smaller than on the exam of 05/21/2014. Favored to be due to differences in slice selection. This may have enlarged minimally since 2007, therefore warrants followup attention. 2. Other pulmonary nodules which measure 4 mm or less. If the patient is at high risk for bronchogenic carcinoma, follow-up chest CT at 1 year is recommended. If the patient is at low risk, no follow-up is needed. This recommendation follows the consensus statement: "Guidelines for Management of Small Pulmonary Nodules Detected on CT Scans: A Statement from the Fleischner Society" as published in Radiology 2005; 237:395-400. Available online at: DietDisorder.cz.   Electronically Signed  By: Jeronimo Greaves M.D.  On: 09/02/2014 16:55  OV 06/18/2015 Chief Complaint  Patient presents with  . Follow-up    Pt here after CT from 02/2015. Pt states her SOB has improved since last OV. Pt denies cough and CP/tightness.    follow-up lung nodule multiple with the largest in the right upper lobe in this nonsmoker. CT chest #2016 visualized personally it shows the lung nodules are stable since May 2016 which itself was an improvement. Results-below  Follow dyspnea: This was improved after right heart cath that showed pulmonary hypertension associated with elevated wedge pressure and starting her on Lasix. At last visit she was significant better. Since then she has continued Lasix and now dyspnea is almost resolved. She is highly frustrated by her atrial fibrillation which is failed ablation many times. She is on multiple medications. She has a large associated hiatal hernia.    COMPARISON: 09/02/2014  FINDINGS: The lungs are well aerated bilaterally. No focal infiltrate or sizable effusion is seen. Areas of scarring as well as multiple small pulmonary nodules are again identified and stable from the prior exam. The largest of these is less  well visualized on the current study in the right upper lobe best seen on image number 19 of series 3. It is stable in appearance measuring between 6 and 8 mm depending on the image plane. Multiple smaller nodules are again seen and stable. The thoracic inlet is within normal limits.  Scattered small mediastinal lymph nodes are again identified and stable. No significant hilar adenopathy is noted.  Large hiatal hernia is seen. The upper abdomen is otherwise within normal limits. The osseous structures show degenerative change of the thoracic spine.  IMPRESSION: Multiple pulmonary nodules. The largest of which lies in the right upper lobe and is stable from the prior study. Followup examination in 1 year is recommended to assess for stability.  The remainder of the exam is stable from the prior study.   Electronically Signed  By: Alcide Clever M.D.  On: 03/06/2015 15:17   OV 03/23/2016  Chief Complaint  Patient presents with  . Follow-up    CT results of right lung. Pt. c/o of fatigue when walking a long distance. Breathing has been ok.    Fu dyspnea - this has resolved aftrer carfdiac intervention but still has exertional fatigue which she  thinks is due to Chronotropic incompetence because of atrial fibrillation treatment. She says she cannot do aerobic exercise because of arthralgia and vaccine. She says she has difficulty losing weight. But admits that weight loss would help  Follow-up right upper lobe 9 mm lung nodule: This is stable from February 2016 through November 2017. She is a nonsmoker.     IMPRESSION: 1. Similar size of a 9 x 8 mm right upper lobe pulmonary nodule back to 05/21/2014. This strongly favors a benign etiology. as 2 years of stability are required to confirm a benign lesion, recommend follow-up chest CT on or after 05/21/2016. 2. Other smaller pulmonary nodules are primarily felt to be similar, but many are better delineated today secondary  to thinner slice collimation. Recommend attention on follow-up. 3. Moderate hiatal hernia.   Electronically Signed   By: Jeronimo GreavesKyle  Talbot M.D.   On: 03/04/2016 14:15    has a past medical history of Chronic diastolic heart failure (HCC) (4/0/98111/07/2015); Complication of anesthesia; Diverticular disease; DJD (degenerative joint disease); Dysphagia; GERD (gastroesophageal reflux disease); Hiatal hernia; Hypertension; Hypothyroidism; Lower leg edema; PAF (paroxysmal atrial fibrillation) (HCC); PONV (postoperative nausea and vomiting); Restless leg syndrome; and Seasonal allergies.   reports that she has never smoked. She has never used smokeless tobacco.  Past Surgical History:  Procedure Laterality Date  . ABDOMINAL HYSTERECTOMY    . APPENDECTOMY    . ATRIAL FIBRILLATION ABLATION  09-06-13   2'14 -[facility]  . BALLOON DILATION  03/03/2011   Procedure: BALLOON DILATION;  Surgeon: Charolett BumpersMartin K Johnson, MD;  [facility];  Service: Endoscopy;  Laterality: N/A;  . bladder polyps    . BREAST SURGERY     left lumpectomy  . CARDIOVERSION     x3  . CARDIOVERSION N/A 12/30/2014   Procedure: CARDIOVERSION;  Surgeon: Lewayne BuntingBrian S Crenshaw, MD;  [facility];  Service: Cardiovascular;  Laterality: N/A;  . CARDIOVERSION N/A 04/23/2015   Procedure: CARDIOVERSION;  Surgeon: Thurmon FairMihai Croitoru, MD;  Location: [facility]OSCOPY;  Service: Cardiovascular;  Laterality: N/A;  . CATARACT EXTRACTION Left   . COLONOSCOPY WITH PROPOFOL N/A 09/24/2013   Procedure: COLONOSCOPY WITH PROPOFOL;  Surgeon: Charolett BumpersMartin K Johnson, MD;  Location: [facility];  Service: Endoscopy;  Laterality: N/A;  . ESOPHAGOGASTRODUODENOSCOPY  03/03/2011   Procedure: ESOPHAGOGASTRODUODENOSCOPY (EGD);  Surgeon: Charolett BumpersMartin K Johnson, MD;  Location[facility];  Service: Endoscopy;  Laterality: N/A;  . EYE SURGERY     cataracat  . RHINOPLASTY    . RIGHT HEART CATHETERIZATION N/A 07/11/2014   Procedure: RIGHT HEART CATH;  Surgeon: Laurey Moralealton S  McLean, MD;  [facility];  Service: Cardiovascular;  Laterality: N/A;  . ROTATOR CUFF REPAIR    . THYROIDECTOMY  1973  . TONSILLECTOMY  1942  . TUBAL LIGATION      Allergies  Allergen Reactions  . Codeine Nausea And Vomiting and Nausea Only  . Penicillins Hives    Has patient had a PCN reaction causing immediate rash, facial/tongue/throat swelling, SOB or lightheadedness with hypotension: No Has patient had a PCN reaction causing severe rash involving mucus membranes or skin necrosis: No Has patient had a PCN reaction that required hospitalization No Has patient had a PCN reaction occurring within the last 10 years: No If all of the above answers are "NO", then may proceed with Cephalosporin use.  . Tetracycline Rash  . Tramadol Nausea Only  . Digoxin Other (See Comments)    Flushed neck and face  . Digoxin And Related  Other (See Comments)    ' Makes my face blood red"  . Erythromycin Other (See Comments) and Hives    Flushing on face  . Other Other (See Comments)    ECG leads cause skin irritation. ECG leads cause skin irritation.  . Sulfa Antibiotics Other (See Comments)    PRESERVATIVE OF SOME FOODS- MAKES FLUSHES TO FACE AND NECK  . Tetracyclines & Related Other (See Comments)    "Sores on legs"  . Diltiazem Rash and Other (See Comments)    Rash with long acting form  . Diltiazem Hcl Rash    Rash with long acting form  . Sulfites Rash    PRESERVATIVE OF SOME FOODS- MAKES FLUSHES TO FACE AND NECK PRESERVATIVE OF SOME FOODS- MAKES FLUSHES TO FACE AND NECK    Immunization History  Administered Date(s) Administered  . Influenza Split 04/18/2014  . Influenza-Unspecified 12/16/2014  . Pneumococcal Conjugate-13 04/24/2014    Family History  Problem Relation Age of Onset  . Transient ischemic attack Mother   . Heart failure Father   . CVA Father   . Lung cancer Father   . Hypertension Sister   . Cancer Brother   . Sudden death Brother   . Anesthesia  problems Neg Hx   . Hypotension Neg Hx   . Malignant hyperthermia Neg Hx   . Pseudochol deficiency Neg Hx   . Heart attack Neg Hx      Current Outpatient Prescriptions:  .  acetaminophen (TYLENOL) 500 MG tablet, Take 500 mg by mouth every 6 (six) hours as needed for mild pain or headache., Disp: , Rfl:  .  butalbital-acetaminophen-caffeine (FIORICET WITH CODEINE) 50-325-40-30 MG per capsule, Take 1 capsule by mouth every 4 (four) hours as needed. For migraines, Disp: , Rfl:  .  BYSTOLIC 5 MG tablet, TAKE ONE TABLET BY MOUTH IN THE MORNING AND 1/2 TABLET IN THE EVENING, Disp: 45 tablet, Rfl: 11 .  Calcium-Magnesium-Vitamin D (CITRACAL CALCIUM+D) 600-40-500 MG-MG-UNIT TB24, Take 1 tablet by mouth daily. , Disp: , Rfl:  .  Cholecalciferol (VITAMIN D) 2000 UNITS CAPS, Take 2,000 Units by mouth daily. Reported on 04/22/2015, Disp: , Rfl:  .  esomeprazole (NEXIUM) 20 MG capsule, Take 20 mg by mouth every other day. , Disp: , Rfl:  .  estradiol (ESTRACE) 1 MG tablet, Take 1 mg by mouth at bedtime. , Disp: , Rfl:  .  furosemide (LASIX) 40 MG tablet, Take 1 tablet (40 mg total) by mouth 2 (two) times daily. (Patient taking differently: Take 40 mg by mouth daily. ), Disp: 60 tablet, Rfl: 11 .  levothyroxine (SYNTHROID, LEVOTHROID) 125 MCG tablet, Take 125 mcg by mouth daily before breakfast. BRAND ONLY, Disp: , Rfl:  .  LORazepam (ATIVAN) 1 MG tablet, Take 0.5-1 mg by mouth at bedtime as needed for sleep. For sleep, Disp: , Rfl:  .  Magnesium 500 MG CAPS, Take 500 mg by mouth daily., Disp: , Rfl:  .  potassium chloride (K-DUR) 10 MEQ tablet, Take 1 tablet (10 mEq total) by mouth 2 (two) times daily. (Patient taking differently: Take 10 mEq by mouth daily. ), Disp: 60 tablet, Rfl: 11 .  verapamil (CALAN-SR) 120 MG CR tablet, TAKE 1 TABLET (120 MG TOTAL) BY MOUTH AT BEDTIME. (Patient taking differently: TAKE 1 TABLET (120 MG TOTAL) BY MOUTH AT LUNCH.), Disp: 30 tablet, Rfl: 9 .  warfarin (COUMADIN) 5 MG  tablet, Take 2.5-5 mg by mouth daily at 6 PM. 5 mg Sunday, Tuesday, Thursday,  2.5mg  Monday, Wednesday, Friday, Saturday, Disp: , Rfl:   Review of Systems     Objective:   Physical Exam  Constitutional: She is oriented to person, place, and time. She appears well-developed and well-nourished. No distress.  Body mass index is 30.94 kg/m.   HENT:  Head: Normocephalic and atraumatic.  Right Ear: External ear normal.  Left Ear: External ear normal.  Mouth/Throat: Oropharynx is clear and moist. No oropharyngeal exudate.  Eyes: Conjunctivae and EOM are normal. Pupils are equal, round, and reactive to light. Right eye exhibits no discharge. Left eye exhibits no discharge. No scleral icterus.  Neck: Normal range of motion. Neck supple. No JVD present. No tracheal deviation present. No thyromegaly present.  Cardiovascular: Normal rate, regular rhythm, normal heart sounds and intact distal pulses.  Exam reveals no gallop and no friction rub.   No murmur heard. Pulmonary/Chest: Effort normal and breath sounds normal. No respiratory distress. She has no wheezes. She has no rales. She exhibits no tenderness.  Abdominal: Soft. Bowel sounds are normal. She exhibits no distension and no mass. There is no tenderness. There is no rebound and no guarding.  Musculoskeletal: Normal range of motion. She exhibits no edema or tenderness.  Lymphadenopathy:    She has no cervical adenopathy.  Neurological: She is alert and oriented to person, place, and time. She has normal reflexes. No cranial nerve deficit. She exhibits normal muscle tone. Coordination normal.  Skin: Skin is warm and dry. No rash noted. She is not diaphoretic. No erythema. No pallor.  Psychiatric: She has a normal mood and affect. Her behavior is normal. Judgment and thought content normal.  Vitals reviewed.  Vitals:   03/23/16 1349  BP: 114/70  Pulse: 66  SpO2: 98%  Weight: 209 lb 8 oz (95 kg)  Height: 5\' 9"  (1.753 m)    Estimated  body mass index is 30.94 kg/m as calculated from the following:   Height as of this encounter: 5\' 9"  (1.753 m).   Weight as of this encounter: 209 lb 8 oz (95 kg).      Assessment:       ICD-9-CM ICD-10-CM   1. Nodule of right lung 793.11 R91.1        Plan:      #Nodule of the right lung upper lobe 31mm and other pulmonary nodules -Stable since May 2016 and November 2016 and Nov 2017 - Please do a follow-up CT chest without contrast in Feb 2019   - do as low dose protocol for fu nodule  #Follow-up - In feb 2019  CT chest without contrast   Dr. Kalman Shan, M.D., [facility].C.P Pulmonary and Critical Care Medicine Staff Physician [facility] [place] [facility] Pager: [phone], If no answer or between  15:00h - 7:00h: call [phone]  03/23/2016 2:24 PM

## 2016-03-23 NOTE — Patient Instructions (Addendum)
ICD-9-CM ICD-10-CM   1. Nodule of right lung 793.11 R91.1     #Nodule of the right lung upper lobe 65mm and other pulmonary nodules -Stable since May 2016 and November 2016 and Nov 2017 - Please do a follow-up CT chest without contrast in Feb 2019   - do as low dose protocol for fu nodule  #Follow-up - In feb 2019  CT chest without contrast

## 2016-03-28 ENCOUNTER — Ambulatory Visit (INDEPENDENT_AMBULATORY_CARE_PROVIDER_SITE_OTHER): Payer: Medicare Other | Admitting: Pharmacist Clinician (PhC)/ Clinical Pharmacy Specialist

## 2016-03-28 DIAGNOSIS — I4891 Unspecified atrial fibrillation: Secondary | ICD-10-CM

## 2016-03-28 LAB — POCT INR: INR: 2.1

## 2016-05-09 ENCOUNTER — Other Ambulatory Visit: Payer: Self-pay | Admitting: Internal Medicine

## 2016-05-09 DIAGNOSIS — N6489 Other specified disorders of breast: Secondary | ICD-10-CM

## 2016-05-16 ENCOUNTER — Ambulatory Visit (INDEPENDENT_AMBULATORY_CARE_PROVIDER_SITE_OTHER): Payer: Medicare Other | Admitting: Pharmacist Clinician (PhC)/ Clinical Pharmacy Specialist

## 2016-05-16 DIAGNOSIS — I4891 Unspecified atrial fibrillation: Secondary | ICD-10-CM

## 2016-05-16 LAB — POCT INR: INR: 2.2

## 2016-05-30 ENCOUNTER — Telehealth: Payer: Self-pay | Admitting: Cardiology

## 2016-05-30 NOTE — Telephone Encounter (Signed)
Patient reports that about 1 month ago, she had dental surgery and her heart has been in and out of rhythm since. She reports no symptoms and denies CP and SOB, but states when she checked her pulse it was fast and irregular. Earlier today, her HR was around 125. Currently, her HR is in the 70s and she states it feels regular. She states she is taking her medications as instructed. Scheduled patient to see Dr. Mayford Knife tomorrow to discuss medication changes/recommendations. She was grateful for call.

## 2016-05-30 NOTE — Telephone Encounter (Signed)
Natasha [name] is calling because she is needing some advice in regards to her AFIb. Heart rate is really fast around 124-125.  Thanks

## 2016-05-31 ENCOUNTER — Ambulatory Visit (INDEPENDENT_AMBULATORY_CARE_PROVIDER_SITE_OTHER): Payer: Medicare Other | Admitting: Cardiology

## 2016-05-31 ENCOUNTER — Encounter: Payer: Self-pay | Admitting: Cardiology

## 2016-05-31 VITALS — BP 120/80 | HR 122 | Ht 70.8 in | Wt 206.4 lb

## 2016-05-31 DIAGNOSIS — I272 Pulmonary hypertension, unspecified: Secondary | ICD-10-CM

## 2016-05-31 DIAGNOSIS — I481 Persistent atrial fibrillation: Secondary | ICD-10-CM

## 2016-05-31 DIAGNOSIS — I10 Essential (primary) hypertension: Secondary | ICD-10-CM | POA: Diagnosis not present

## 2016-05-31 DIAGNOSIS — I4819 Other persistent atrial fibrillation: Secondary | ICD-10-CM

## 2016-05-31 DIAGNOSIS — I5032 Chronic diastolic (congestive) heart failure: Secondary | ICD-10-CM

## 2016-05-31 MED ORDER — NEBIVOLOL HCL 5 MG PO TABS: 5.0000 mg | ORAL_TABLET | Freq: Two times a day (BID) | ORAL | 11 refills | Status: DC

## 2016-05-31 NOTE — Patient Instructions (Addendum)
Medication Instructions:  1) INCREASE BYSTOLIC to 5 mg TWICE DAILY  Labwork: None  Testing/Procedures: Your physician has requested that you have an echocardiogram. Echocardiography is a painless test that uses sound waves to create images of your heart. It provides your doctor with information about the size and shape of your heart and how well your heart's chambers and valves are working. This procedure takes approximately one hour. There are no restrictions for this procedure.  Follow-Up: You have an appointment scheduled with Dr. Ladona Ridgel THIS Friday at 2:30 PM.  Your physician wants you to follow-up in: 6 months with Dr. Mayford Knife. You will receive a reminder letter in the mail two months in advance. If you don't receive a letter, please call our office to schedule the follow-up appointment.   Any Other Special Instructions Will Be Listed Below (If Applicable).     If you need a refill on your cardiac medications before your next appointment, please call your pharmacy.

## 2016-05-31 NOTE — Progress Notes (Signed)
Cardiology Office Note    Date:  05/31/2016   ID:  Natasha [name], DOB Nov 29, 1932, MRN [id number]  PCP:  Pearla Dubonnet, MD  Cardiologist:  Armanda Magic, MD   Chief Complaint  Patient presents with  . Atrial Fibrillation  . Congestive Heart Failure  . Hypertension    History of Present Illness:  Natasha [name] is a 83 y.o. female who presents for followup of afib.Patient has had 2 ablations at [facility]. Nuclear study September 2015 showed ejection fraction 76% and normal perfusion.Echocardiogram December 2015 showed normal LV function, mild mitral regurgitation and tricuspid regurgitation. Mildly elevated pulmonary pressures. Patient had right heart catheterization in March 2016 which revealed a pulmonary capillary wedge pressure of 20 with preserved cardiac output. Pulmonary venous hypertension felt secondary to diastolic dysfunction.   She had reoccurrence of afib with RVR  and was placed on multaq. However she did not convert and apparently the medication made her feel bad and didn't continue this medication. She has failed sotolol/dofetilide/flecainide/propafenone/Amio and dronedarone either with not controlling her PAF or having side effects. When she is not in atrial fibrillation she denies dyspnea, chest pain or syncope. She underwent DCCV on 12/30/14 to NSR. She was seen back in afib clinic and was in atrial flutter.She was seen by Dr. Ladona Ridgel in EP clinic and offerred AVN ablation with PPM vs afib ablation and patient decided to continue Verapamil and bystolic.   She presents back today for followup.  She still goes in and out of PAF.  She had dental surgery done a month ago and since then her heart has been in and out of afib/flutter.  When she goes into it she goes fast. She denies any chest pain,  LE edema, dizziness or syncope. She has some DOE when walking long distances and in the past month with her atrial arrhythmias she thinks it has gotten worse.     Past Medical History:  Diagnosis Date  . Chronic diastolic heart failure (HCC) 04/22/2015  . Complication of anesthesia   . Diverticular disease   . DJD (degenerative joint disease)   . Dysphagia   . GERD (gastroesophageal reflux disease)   . Hiatal hernia   . Hypertension   . Hypothyroidism   . Lower leg edema   . PAF (paroxysmal atrial fibrillation) (HCC)    s/p ablation x several times now with occasional episodes of PAF.   She has failed sotolol/dofetilide/flecainide/propafenone/Amio and dronedarone either with not controlling her PAF or having side effects  . PONV (postoperative nausea and vomiting)   . Restless leg syndrome   . Seasonal allergies     Past Surgical History:  Procedure Laterality Date  . ABDOMINAL HYSTERECTOMY    . APPENDECTOMY    . ATRIAL FIBRILLATION ABLATION  09-06-13   2'14 -[facility]  . BALLOON DILATION  03/03/2011   Procedure: BALLOON DILATION;  Surgeon: Charolett Bumpers, MD;  Location: [facility];  Service: Endoscopy;  Laterality: N/A;  . bladder polyps    . BREAST SURGERY     left lumpectomy  . CARDIOVERSION     x3  . CARDIOVERSION N/A 12/30/2014   Procedure: CARDIOVERSION;  Surgeon: Lewayne Bunting, MD;  Location: [facility] ENDOSCOPY;  Service: Cardiovascular;  Laterality: N/A;  . CARDIOVERSION N/A 04/23/2015   Procedure: CARDIOVERSION;  Surgeon: Thurmon Fair, MD;  Location: [facility] ENDOSCOPY;  Service: Cardiovascular;  Laterality: N/A;  . CATARACT EXTRACTION Left   . COLONOSCOPY WITH PROPOFOL N/A 09/24/2013   Procedure: COLONOSCOPY  WITH PROPOFOL;  Surgeon: Charolett Bumpers, MD;  Location: [facility] ENDOSCOPY;  Service: Endoscopy;  Laterality: N/A;  . ESOPHAGOGASTRODUODENOSCOPY  03/03/2011   Procedure: ESOPHAGOGASTRODUODENOSCOPY (EGD);  Surgeon: Charolett Bumpers, MD;  Location: [facility]OSCOPY;  Service: Endoscopy;  Laterality: N/A;  . EYE SURGERY     cataracat  . RHINOPLASTY    . RIGHT HEART CATHETERIZATION N/A 07/11/2014   Procedure: RIGHT HEART CATH;   Surgeon: Laurey Morale, MD;  Location: [facility] CATH LAB;  Service: Cardiovascular;  Laterality: N/A;  . ROTATOR CUFF REPAIR    . THYROIDECTOMY  1973  . TONSILLECTOMY  1942  . TUBAL LIGATION      Current Medications: Outpatient Medications Prior to Visit  Medication Sig Dispense Refill  . acetaminophen (TYLENOL) 500 MG tablet Take 500 mg by mouth every 6 (six) hours as needed for mild pain or headache.    . butalbital-acetaminophen-caffeine (FIORICET WITH CODEINE) 50-325-40-30 MG per capsule Take 1 capsule by mouth every 4 (four) hours as needed. For migraines    . BYSTOLIC 5 MG tablet TAKE ONE TABLET BY MOUTH IN THE MORNING AND 1/2 TABLET IN THE EVENING 45 tablet 11  . Calcium-Magnesium-Vitamin D (CITRACAL CALCIUM+D) 600-40-500 MG-MG-UNIT TB24 Take 1 tablet by mouth daily.     . Cholecalciferol (VITAMIN D) 2000 UNITS CAPS Take 2,000 Units by mouth daily. Reported on 04/22/2015    . esomeprazole (NEXIUM) 20 MG capsule Take 20 mg by mouth every other day.     . estradiol (ESTRACE) 1 MG tablet Take 1 mg by mouth at bedtime.     . furosemide (LASIX) 40 MG tablet Take 1 tablet (40 mg total) by mouth 2 (two) times daily. (Patient taking differently: Take 40 mg by mouth daily. ) 60 tablet 11  . levothyroxine (SYNTHROID, LEVOTHROID) 125 MCG tablet Take 125 mcg by mouth daily before breakfast. BRAND ONLY    . LORazepam (ATIVAN) 1 MG tablet Take 0.5-1 mg by mouth at bedtime as needed for sleep. For sleep    . Magnesium 500 MG CAPS Take 500 mg by mouth daily.    . potassium chloride (K-DUR) 10 MEQ tablet Take 1 tablet (10 mEq total) by mouth 2 (two) times daily. (Patient taking differently: Take 10 mEq by mouth daily. ) 60 tablet 11  . verapamil (CALAN-SR) 120 MG CR tablet TAKE 1 TABLET (120 MG TOTAL) BY MOUTH AT BEDTIME. (Patient taking differently: TAKE 1 TABLET (120 MG TOTAL) BY MOUTH AT LUNCH.) 30 tablet 9  . warfarin (COUMADIN) 5 MG tablet Take 2.5-5 mg by mouth daily at 6 PM. 5 mg Sunday, Tuesday,  Thursday, 2.5mg  Monday, Wednesday, Friday, Saturday     No facility-administered medications prior to visit.      Allergies:   Codeine; Penicillins; Tetracycline; Tramadol; Digoxin; Digoxin and related; Erythromycin; Other; Sulfa antibiotics; Tetracyclines & related; Diltiazem; Diltiazem hcl; and Sulfites   Social History   Social History  . Marital status: Married    Spouse name: N/A  . Number of children: N/A  . Years of education: N/A   Occupational History  . retired    Social History Main Topics  . Smoking status: Never Smoker  . Smokeless tobacco: Never Used  . Alcohol use 0.0 oz/week     Comment: rare wine  . Drug use: No  . Sexual activity: No   Other Topics Concern  . None   Social History Narrative  . None     Family History:  The patient's family history includes  CVA in her father; Cancer in her brother; Heart failure in her father; Hypertension in her sister; Lung cancer in her father; Sudden death in her brother; Transient ischemic attack in her mother.   ROS:   Please see the history of present illness.    ROS All other systems reviewed and are negative.  No flowsheet data found.     PHYSICAL EXAM:   VS:  BP 120/80   Pulse (!) 122   Ht 5' 10.8" (1.798 m)   Wt 206 lb 6.4 oz (93.6 kg)   SpO2 98%   BMI 28.95 kg/m    GEN: Well nourished, well developed, in no acute distress  HEENT: normal  Neck: no JVD, carotid bruits, or masses Cardiac: RRR; no murmurs, rubs, or gallops,no edema.  Intact distal pulses bilaterally.  Respiratory:  clear to auscultation bilaterally, normal work of breathing GI: soft, nontender, nondistended, + BS MS: no deformity or atrophy  Skin: warm and dry, no rash Neuro:  Alert and Oriented x 3, Strength and sensation are intact Psych: euthymic mood, full affect  Wt Readings from Last 3 Encounters:  05/31/16 206 lb 6.4 oz (93.6 kg)  03/23/16 209 lb 8 oz (95 kg)  03/04/16 210 lb 3.2 oz (95.3 kg)      Studies/Labs  Reviewed:   EKG:  EKG is not ordered today.   Recent Labs: 01/12/2016: BUN 13; Creat 0.93; Potassium 4.1; Sodium 138   Lipid Panel No results found for: CHOL, TRIG, HDL, CHOLHDL, VLDL, LDLCALC, LDLDIRECT  Additional studies/ records that were reviewed today include:  none    ASSESSMENT:    1. Chronic diastolic heart failure (HCC)   2. Essential hypertension   3. Persistent atrial fibrillation (HCC)   4. Pulmonary hypertension      PLAN:  In order of problems listed above:  1. Chronic diastolic CHF - she appears euvolemic on exam today and weight is stable.  She will continue on diuretic. 2. HTN - BP controlled on current meds.  Continue BB and Verapamil 3. Persistent atrial fibrillation now in what appears to be atrial flutter with 2:1 block which breaks on its own for a short period and then back to atrial flutter.  Continue Verapamil and increase Bystolic to 5mg  BID.  She will continue on warfarin.  I am going to get her back in to see Dr. Ladona Ridgel for further recommendations.  She has failed multiple antiarrhythmics in the past due to either intolerance or not successful at maintaining NSR.  Dr. Ladona Ridgel has talked to her about AVN ablation and PPM in the past but she has been reluctant.  It appears that she has atrial tachycardia or atrial flutter today so ? Whether this could be ablated.  I will set her up to see Dr. Ladona Ridgel later this week. 4. Pulmonary HTN - mild at by echo 2016 - repeat echo to assess for progression.    Medication Adjustments/Labs and Tests Ordered: Current medicines are reviewed at length with the patient today.  Concerns regarding medicines are outlined above.  Medication changes, Labs and Tests ordered today are listed in the Patient Instructions below.  There are no Patient Instructions on file for this visit.   Signed, Armanda Magic, MD  05/31/2016 9:25 AM    [facility] [address] [place], [place], [place]  [address] Phone:  [phone]; Fax: [phone]

## 2016-06-01 DIAGNOSIS — Z79890 Hormone replacement therapy (postmenopausal): Secondary | ICD-10-CM | POA: Diagnosis not present

## 2016-06-01 DIAGNOSIS — E559 Vitamin D deficiency, unspecified: Secondary | ICD-10-CM | POA: Diagnosis not present

## 2016-06-01 DIAGNOSIS — E669 Obesity, unspecified: Secondary | ICD-10-CM | POA: Diagnosis not present

## 2016-06-01 DIAGNOSIS — E039 Hypothyroidism, unspecified: Secondary | ICD-10-CM | POA: Diagnosis not present

## 2016-06-01 DIAGNOSIS — Z0000 Encounter for general adult medical examination without abnormal findings: Secondary | ICD-10-CM | POA: Diagnosis not present

## 2016-06-01 DIAGNOSIS — Z79899 Other long term (current) drug therapy: Secondary | ICD-10-CM | POA: Diagnosis not present

## 2016-06-01 DIAGNOSIS — M2550 Pain in unspecified joint: Secondary | ICD-10-CM | POA: Diagnosis not present

## 2016-06-01 DIAGNOSIS — I4891 Unspecified atrial fibrillation: Secondary | ICD-10-CM | POA: Diagnosis not present

## 2016-06-01 DIAGNOSIS — I119 Hypertensive heart disease without heart failure: Secondary | ICD-10-CM | POA: Diagnosis not present

## 2016-06-01 DIAGNOSIS — Z6831 Body mass index (BMI) 31.0-31.9, adult: Secondary | ICD-10-CM | POA: Diagnosis not present

## 2016-06-01 DIAGNOSIS — Z1389 Encounter for screening for other disorder: Secondary | ICD-10-CM | POA: Diagnosis not present

## 2016-06-01 DIAGNOSIS — I10 Essential (primary) hypertension: Secondary | ICD-10-CM | POA: Diagnosis not present

## 2016-06-03 ENCOUNTER — Encounter: Payer: Self-pay | Admitting: Internal Medicine

## 2016-06-03 ENCOUNTER — Telehealth: Payer: Self-pay | Admitting: Internal Medicine

## 2016-06-03 ENCOUNTER — Ambulatory Visit (INDEPENDENT_AMBULATORY_CARE_PROVIDER_SITE_OTHER): Payer: Medicare Other | Admitting: Internal Medicine

## 2016-06-03 VITALS — BP 110/80 | HR 71 | Ht 69.0 in | Wt 206.8 lb

## 2016-06-03 DIAGNOSIS — I10 Essential (primary) hypertension: Secondary | ICD-10-CM | POA: Diagnosis not present

## 2016-06-03 DIAGNOSIS — I4819 Other persistent atrial fibrillation: Secondary | ICD-10-CM

## 2016-06-03 DIAGNOSIS — I481 Persistent atrial fibrillation: Secondary | ICD-10-CM

## 2016-06-03 DIAGNOSIS — I5032 Chronic diastolic (congestive) heart failure: Secondary | ICD-10-CM | POA: Diagnosis not present

## 2016-06-03 NOTE — Patient Instructions (Addendum)
Medication Instructions:  Your physician recommends that you continue on your current medications as directed. Please refer to the Current Medication list given to you today.   Labwork: None Ordered   Testing/Procedures: None Ordered    Follow-Up: Your physician recommends that you schedule a follow-up appointment in: 3 months with Dr. Taylor   Any Other Special Instructions Will Be Listed Below (If Applicable).     If you need a refill on your cardiac medications before your next appointment, please call your pharmacy.   

## 2016-06-03 NOTE — Telephone Encounter (Signed)
Received a rejection on the bystolic by the pts insurance via cover my meds. The insurance may cover the following without a PA being done:  Atenolol Carvedilol Metoprolol succ ER Metoprolol tart Bisoprolol  MR please advise. thanks

## 2016-06-03 NOTE — Progress Notes (Signed)
HPI Mrs. Hallmark returns today for post op followup. She is a pleasant 83 yo woman with atrial fib and a RVR, HTN, who has had 2 atrial fib ablations at [facility]. She has continued to have atrial fib/flutter. She was hospitalized several months ago and underwent DCCV. She returned to atrial fib and was placed on Verapamil in addition to Bystolic.  She appears to be maintaining NSR most of the time. She notes that she has had as much as 5 hours of atrial fib at a time. Most recently, she has had atrial tachycardia which is paraoxysmal.  Allergies  Allergen Reactions  . Codeine Nausea And Vomiting and Nausea Only  . Penicillins Hives    Has patient had a PCN reaction causing immediate rash, facial/tongue/throat swelling, SOB or lightheadedness with hypotension: No Has patient had a PCN reaction causing severe rash involving mucus membranes or skin necrosis: No Has patient had a PCN reaction that required hospitalization No Has patient had a PCN reaction occurring within the last 10 years: No If all of the above answers are "NO", then may proceed with Cephalosporin use.  . Tetracycline Rash  . Tramadol Nausea Only  . Digoxin Other (See Comments)    Flushed neck and face  . Digoxin And Related Other (See Comments)    ' Makes my face blood red"  . Erythromycin Other (See Comments) and Hives    Flushing on face  . Other Other (See Comments)    ECG leads cause skin irritation. ECG leads cause skin irritation.  . Sulfa Antibiotics Other (See Comments)    PRESERVATIVE OF SOME FOODS- MAKES FLUSHES TO FACE AND NECK  . Tetracyclines & Related Other (See Comments)    "Sores on legs"  . Diltiazem Rash and Other (See Comments)    Rash with long acting form  . Diltiazem Hcl Rash    Rash with long acting form  . Sulfites Rash    PRESERVATIVE OF SOME FOODS- MAKES FLUSHES TO FACE AND NECK PRESERVATIVE OF SOME FOODS- MAKES FLUSHES TO FACE AND NECK     Current Outpatient Prescriptions    Medication Sig Dispense Refill  . acetaminophen (TYLENOL) 500 MG tablet Take 500 mg by mouth every 6 (six) hours as needed for mild pain or headache.    . butalbital-acetaminophen-caffeine (FIORICET WITH CODEINE) 50-325-40-30 MG per capsule Take 1 capsule by mouth every 4 (four) hours as needed. For migraines    . Calcium-Magnesium-Vitamin D (CITRACAL CALCIUM+D) 600-40-500 MG-MG-UNIT TB24 Take 1 tablet by mouth daily.     . Cholecalciferol (VITAMIN D) 2000 UNITS CAPS Take 2,000 Units by mouth daily. Reported on 04/22/2015    . esomeprazole (NEXIUM) 20 MG capsule Take 20 mg by mouth every other day.     . estradiol (ESTRACE) 1 MG tablet Take 1 mg by mouth at bedtime.     . furosemide (LASIX) 40 MG tablet Take 40 mg by mouth daily.    Marland Kitchen levothyroxine (SYNTHROID, LEVOTHROID) 125 MCG tablet Take 125 mcg by mouth daily before breakfast. BRAND ONLY    . LORazepam (ATIVAN) 1 MG tablet Take 0.5-1 mg by mouth at bedtime as needed for sleep. For sleep    . Magnesium 500 MG CAPS Take 500 mg by mouth daily.    . nebivolol (BYSTOLIC) 5 MG tablet Take 1 tablet (5 mg total) by mouth 2 (two) times daily. 60 tablet 11  . potassium chloride (K-DUR) 10 MEQ tablet Take 10 mEq by mouth daily.    Marland Kitchen  verapamil (CALAN-SR) 120 MG CR tablet Take 120 mg by mouth daily with lunch.    . warfarin (COUMADIN) 5 MG tablet Take 2.5-5 mg by mouth daily at 6 PM. 5 mg Sunday, Tuesday, Thursday, 2.5mg  Monday, Wednesday, Friday, Saturday     No current facility-administered medications for this visit.      Past Medical History:  Diagnosis Date  . Chronic diastolic heart failure (HCC) 04/22/2015  . Complication of anesthesia   . Diverticular disease   . DJD (degenerative joint disease)   . Dysphagia   . GERD (gastroesophageal reflux disease)   . Hiatal hernia   . Hypertension   . Hypothyroidism   . Lower leg edema   . PAF (paroxysmal atrial fibrillation) (HCC)    s/p ablation x several times now with occasional episodes of  PAF.   She has failed sotolol/dofetilide/flecainide/propafenone/Amio and dronedarone either with not controlling her PAF or having side effects  . PONV (postoperative nausea and vomiting)   . Restless leg syndrome   . Seasonal allergies     ROS:   All systems reviewed and negative except as noted in the HPI.   Past Surgical History:  Procedure Laterality Date  . ABDOMINAL HYSTERECTOMY    . APPENDECTOMY    . ATRIAL FIBRILLATION ABLATION  09-06-13   2'14 -[facility]  . BALLOON DILATION  03/03/2011   Procedure: BALLOON DILATION;  Surgeon: Charolett Bumpers, MD;  Location: [facility]COPY;  Service: Endoscopy;  Laterality: N/A;  . bladder polyps    . BREAST SURGERY     left lumpectomy  . CARDIOVERSION     x3  . CARDIOVERSION N/A 12/30/2014   Procedure: CARDIOVERSION;  Surgeon: Lewayne Bunting, MD;  Location: [facility] ENDOSCOPY;  Service: Cardiovascular;  Laterality: N/A;  . CARDIOVERSION N/A 04/23/2015   Procedure: CARDIOVERSION;  Surgeon: Thurmon Fair, MD;  Location: [facility] ENDOSCOPY;  Service: Cardiovascular;  Laterality: N/A;  . CATARACT EXTRACTION Left   . COLONOSCOPY WITH PROPOFOL N/A 09/24/2013   Procedure: COLONOSCOPY WITH PROPOFOL;  Surgeon: Charolett Bumpers, MD;  Location: [facility] ENDOSCOPY;  Service: Endoscopy;  Laterality: N/A;  . ESOPHAGOGASTRODUODENOSCOPY  03/03/2011   Procedure: ESOPHAGOGASTRODUODENOSCOPY (EGD);  Surgeon: Charolett Bumpers, MD;  Location: [facility] ENDOSCOPY;  Service: Endoscopy;  Laterality: N/A;  . EYE SURGERY     cataracat  . RHINOPLASTY    . RIGHT HEART CATHETERIZATION N/A 07/11/2014   Procedure: RIGHT HEART CATH;  Surgeon: Laurey Morale, MD;  Location: [facility] CATH LAB;  Service: Cardiovascular;  Laterality: N/A;  . ROTATOR CUFF REPAIR    . THYROIDECTOMY  1973  . TONSILLECTOMY  1942  . TUBAL LIGATION       Family History  Problem Relation Age of Onset  . Transient ischemic attack Mother   . Heart failure Father   . CVA Father   . Lung cancer Father   .  Hypertension Sister   . Cancer Brother   . Sudden death Brother   . Anesthesia problems Neg Hx   . Hypotension Neg Hx   . Malignant hyperthermia Neg Hx   . Pseudochol deficiency Neg Hx   . Heart attack Neg Hx      Social History   Social History  . Marital status: Married    Spouse name: N/A  . Number of children: N/A  . Years of education: N/A   Occupational History  . retired    Social History Main Topics  . Smoking status: Never Smoker  . Smokeless tobacco:  Never Used  . Alcohol use 0.0 oz/week     Comment: rare wine  . Drug use: No  . Sexual activity: No   Other Topics Concern  . Not on file   Social History Narrative  . No narrative on file     BP 110/80   Pulse 71   Ht 5\' 9"  (1.753 m)   Wt 206 lb 12.8 oz (93.8 kg)   BMI 30.54 kg/m   Physical Exam:  Well appearing overweight elderly woman, NAD HEENT: Unremarkable Neck:  6 cmJVD, no thyromegally Lymphatics:  No adenopathy Back:  No CVA tenderness Lungs:  Clear with no wheezes HEART:  Regular rate rhythm, no murmurs, no rubs, no clicks Abd:  soft, positive bowel sounds, no organomegally, no rebound, no guarding Ext:  2 plus pulses, no edema, no cyanosis, no clubbing Skin:  No rashes no nodules Neuro:  CN II through XII intact, motor grossly intact  NSR with first degree AV block  Assess/Plan:  1. Atrial fib with a RVR - she is s/p 2 ablations and has had continued atrial fib and now atrial tachycardia. We again discussed at length her multiple treatment options. For now she will continue her current medical regimen.  We will see her back in 3 months. She feels a little weaker on the higher dose Bystolic but I encouraged her to continue for now.  2. HTN heart disease - her blood pressure is well controlled. No change in meds.   3. Constipation - she has had some on verapamil. This is tolerable.  4. Pulmonary HTN - her symptoms are well controlled. Will follow.  Leonia Reeves.D.

## 2016-06-06 NOTE — Telephone Encounter (Signed)
lmtcb x1 for pt to advise of medication change.

## 2016-06-06 NOTE — Telephone Encounter (Signed)
Rx bisoprolol - same dose  Dr. Kalman Shan, M.D., [facility].C.P Pulmonary and Critical Care Medicine Staff Physician [facility] Pager: [phone], If no answer or between  15:00h - 7:00h: call [phone]  06/06/2016 4:39 PM

## 2016-06-10 ENCOUNTER — Ambulatory Visit (HOSPITAL_COMMUNITY): Payer: Medicare Other | Attending: Cardiology

## 2016-06-10 ENCOUNTER — Telehealth: Payer: Self-pay

## 2016-06-10 ENCOUNTER — Other Ambulatory Visit: Payer: Self-pay

## 2016-06-10 DIAGNOSIS — I340 Nonrheumatic mitral (valve) insufficiency: Secondary | ICD-10-CM | POA: Insufficient documentation

## 2016-06-10 DIAGNOSIS — I501 Left ventricular failure: Secondary | ICD-10-CM | POA: Diagnosis not present

## 2016-06-10 DIAGNOSIS — I272 Pulmonary hypertension, unspecified: Secondary | ICD-10-CM | POA: Insufficient documentation

## 2016-06-10 NOTE — Telephone Encounter (Addendum)
The pts insurance will not cover Bystolic 5 mg BID but they will cover 10 mg once daily. Is this ok or do you want to change medications?   Please advise.

## 2016-06-11 NOTE — Telephone Encounter (Signed)
Bystolic 10mg  daily is fine

## 2016-06-13 ENCOUNTER — Ambulatory Visit
Admission: RE | Admit: 2016-06-13 | Discharge: 2016-06-13 | Disposition: A | Discharge status: Home / Self Care | Payer: Medicare Other | Source: Ambulatory Visit | Attending: Internal Medicine | Admitting: Internal Medicine

## 2016-06-13 DIAGNOSIS — N644 Mastodynia: Secondary | ICD-10-CM | POA: Diagnosis not present

## 2016-06-13 DIAGNOSIS — N6489 Other specified disorders of breast: Secondary | ICD-10-CM

## 2016-06-13 MED ORDER — NEBIVOLOL HCL 10 MG PO TABS: 10.0000 mg | ORAL_TABLET | Freq: Every day | ORAL | 11 refills | Status: DC

## 2016-06-13 NOTE — Telephone Encounter (Signed)
Patient understands Bystolic 10 mg daily has been called in for insurance purposes. She was grateful for call and agrees with treatment plan.

## 2016-06-27 ENCOUNTER — Ambulatory Visit (INDEPENDENT_AMBULATORY_CARE_PROVIDER_SITE_OTHER): Payer: Medicare Other | Admitting: Pharmacist Clinician (PhC)/ Clinical Pharmacy Specialist

## 2016-06-27 DIAGNOSIS — I4891 Unspecified atrial fibrillation: Secondary | ICD-10-CM | POA: Diagnosis not present

## 2016-06-27 LAB — POCT INR: INR: 2.1

## 2016-07-11 ENCOUNTER — Ambulatory Visit: Payer: Medicare Other | Admitting: Cardiology

## 2016-07-15 ENCOUNTER — Ambulatory Visit (INDEPENDENT_AMBULATORY_CARE_PROVIDER_SITE_OTHER): Payer: Medicare Other

## 2016-07-15 ENCOUNTER — Telehealth: Payer: Self-pay | Admitting: Cardiology

## 2016-07-15 VITALS — BP 130/80 | HR 70 | Ht 69.0 in | Wt 203.2 lb

## 2016-07-15 DIAGNOSIS — I4819 Other persistent atrial fibrillation: Secondary | ICD-10-CM

## 2016-07-15 DIAGNOSIS — I481 Persistent atrial fibrillation: Secondary | ICD-10-CM

## 2016-07-15 NOTE — Telephone Encounter (Signed)
New Message     Per pt is experiencing tachycardia, and wants to know what she needs to do. Requesting to speak with nurse

## 2016-07-15 NOTE — Progress Notes (Signed)
1.) Reason for visit: EKG, Fast heart beat  2.) Name of MD requesting visit:[facility] Dr.Ross  3.) H&P: Hx of AFib   4.) ROS related to problem: Patient complaining of heart beating fast most of night,weakness, no energy.This morning when she woke up pulse 130 B/P 108/89.  5.) Assessment and plan per MD: Dr.Nelson covering for Dr.Ross reviewed EKG, sinus rhythm rate 70.Appointment scheduled with Nada Boozer NP 07/19/16 at 2:00 pm.Advised to go to ED over the weekend if she has any more fast heart beat.

## 2016-07-15 NOTE — Patient Instructions (Addendum)
Continue same medications    Appointment scheduled with Nada Boozer NP  Tuesday 07/19/16 at 2:00 pm

## 2016-07-15 NOTE — Telephone Encounter (Signed)
Returned call to patient.She stated she has been having fast heart beat off and on all week.Stated at present heart rate 130 B/P 108/89.Stated she feels weak,no energy.Spoke to DOD Dr.Ross she advised needs to come to office for a EKG.Advised to have someone drive her.She will come to office now.

## 2016-07-19 ENCOUNTER — Ambulatory Visit (INDEPENDENT_AMBULATORY_CARE_PROVIDER_SITE_OTHER): Payer: Medicare Other | Admitting: Cardiology

## 2016-07-19 ENCOUNTER — Encounter: Payer: Self-pay | Admitting: Cardiology

## 2016-07-19 VITALS — BP 124/84 | HR 75 | Ht 69.0 in | Wt 208.0 lb

## 2016-07-19 DIAGNOSIS — R000 Tachycardia, unspecified: Secondary | ICD-10-CM

## 2016-07-19 DIAGNOSIS — I481 Persistent atrial fibrillation: Secondary | ICD-10-CM

## 2016-07-19 DIAGNOSIS — I5032 Chronic diastolic (congestive) heart failure: Secondary | ICD-10-CM

## 2016-07-19 DIAGNOSIS — I10 Essential (primary) hypertension: Secondary | ICD-10-CM

## 2016-07-19 DIAGNOSIS — I4819 Other persistent atrial fibrillation: Secondary | ICD-10-CM

## 2016-07-19 MED ORDER — NEBIVOLOL HCL 5 MG PO TABS: 5.0000 mg | ORAL_TABLET | Freq: Every day | ORAL | 6 refills | Status: DC | PRN

## 2016-07-19 MED ORDER — NEBIVOLOL HCL 5 MG PO TABS: 5.0000 mg | ORAL_TABLET | Freq: Two times a day (BID) | ORAL | 3 refills | Status: DC

## 2016-07-19 MED ORDER — NEBIVOLOL HCL 10 MG PO TABS: 10.0000 mg | ORAL_TABLET | Freq: Every day | ORAL | 3 refills | Status: DC

## 2016-07-19 NOTE — Progress Notes (Addendum)
Cardiology Office Note   Date:  07/19/2016   ID:  Natasha [name], DOB 03/01/1933, MRN [id number]  PCP:  Pearla Dubonnet, MD  Cardiologist:  Dr. Mayford Knife  EP Dr. Ladona Ridgel     Chief Complaint  Patient presents with  . Tachycardia      History of Present Illness: Natasha [name] is a 83 y.o. female who presents for tachycardia.   Pt came in last Fri and EKG was SR rate of 70  She had called with HR of 130.   She has a hx of atrial fib and a RVR, HTN, who has had 2 atrial fib ablations at [facility]. She has continued to have atrial fib/flutter. She was hospitalized several months ago and underwent DCCV. She returned to atrial fib and was placed on Verapamil in addition to Bystolic 5 mg BID.  Had been maintaining SR.    Also hx of chronic diastolic HF, pulmonary HTN. Marland Kitchen  She has per Dr. Norris Cross note been on Multaq, Rythmol, flecaide, tikosyn, and pt refused amiodarone.    Today she complains of more frequent a fib/or atrial tach HR to 130 - the episode lasted all night.  She eventually took extra 5 of bystolic and everything slowed.  She is frustrated with a Fib.  She has no chest pain or SOB.    Past Medical History:  Diagnosis Date  . Chronic diastolic heart failure (HCC) 04/22/2015  . Complication of anesthesia   . Diverticular disease   . DJD (degenerative joint disease)   . Dysphagia   . GERD (gastroesophageal reflux disease)   . Hiatal hernia   . Hypertension   . Hypothyroidism   . Lower leg edema   . PAF (paroxysmal atrial fibrillation) (HCC)    s/p ablation x several times now with occasional episodes of PAF.   She has failed sotolol/dofetilide/flecainide/propafenone/Amio and dronedarone either with not controlling her PAF or having side effects  . PONV (postoperative nausea and vomiting)   . Restless leg syndrome   . Seasonal allergies     Past Surgical History:  Procedure Laterality Date  . ABDOMINAL HYSTERECTOMY    . APPENDECTOMY    . ATRIAL FIBRILLATION ABLATION  09-06-13    2'14 -[facility]  . BALLOON DILATION  03/03/2011   Procedure: BALLOON DILATION;  Surgeon: Charolett Bumpers, MD;  Location: [facility];  Service: Endoscopy;  Laterality: N/A;  . bladder polyps    . BREAST SURGERY     left lumpectomy  . CARDIOVERSION     x3  . CARDIOVERSION N/A 12/30/2014   Procedure: CARDIOVERSION;  Surgeon: Lewayne Bunting, MD;  Location: [facility] ENDOSCOPY;  Service: Cardiovascular;  Laterality: N/A;  . CARDIOVERSION N/A 04/23/2015   Procedure: CARDIOVERSION;  Surgeon: Thurmon Fair, MD;  Location: [facility] ENDOSCOPY;  Service: Cardiovascular;  Laterality: N/A;  . CATARACT EXTRACTION Left   . COLONOSCOPY WITH PROPOFOL N/A 09/24/2013   Procedure: COLONOSCOPY WITH PROPOFOL;  Surgeon: Charolett Bumpers, MD;  Location: [facility] ENDOSCOPY;  Service: Endoscopy;  Laterality: N/A;  . ESOPHAGOGASTRODUODENOSCOPY  03/03/2011   Procedure: ESOPHAGOGASTRODUODENOSCOPY (EGD);  Surgeon: Charolett Bumpers, MD;  Location: [facility] ENDOSCOPY;  Service: Endoscopy;  Laterality: N/A;  . EYE SURGERY     cataracat  . RHINOPLASTY    . RIGHT HEART CATHETERIZATION N/A 07/11/2014   Procedure: RIGHT HEART CATH;  Surgeon: Laurey Morale, MD;  Location: [facility] CATH LAB;  Service: Cardiovascular;  Laterality: N/A;  . ROTATOR CUFF REPAIR    . THYROIDECTOMY  1973  . TONSILLECTOMY  1942  . TUBAL LIGATION       Current Outpatient Prescriptions  Medication Sig Dispense Refill  . acetaminophen (TYLENOL) 500 MG tablet Take 500 mg by mouth every 6 (six) hours as needed for mild pain or headache.    . butalbital-acetaminophen-caffeine (FIORICET WITH CODEINE) 50-325-40-30 MG per capsule Take 1 capsule by mouth every 4 (four) hours as needed. For migraines    . Calcium-Magnesium-Vitamin D (CITRACAL CALCIUM+D) 600-40-500 MG-MG-UNIT TB24 Take 1 tablet by mouth daily.     . chlorhexidine (PERIDEX) 0.12 % solution Use as directed 5 mLs in the mouth or throat daily as needed (gums).   2  . Cholecalciferol (VITAMIN D) 2000 UNITS CAPS  Take 2,000 Units by mouth daily. Reported on 04/22/2015    . esomeprazole (NEXIUM) 20 MG capsule Take 20 mg by mouth every other day.     . estradiol (ESTRACE) 1 MG tablet Take 1 mg by mouth at bedtime.     . furosemide (LASIX) 40 MG tablet Take 40 mg by mouth daily.    Marland Kitchen levothyroxine (SYNTHROID, LEVOTHROID) 125 MCG tablet Take 125 mcg by mouth daily before breakfast. BRAND ONLY    . LORazepam (ATIVAN) 1 MG tablet Take 0.5-1 mg by mouth at bedtime as needed for sleep. For sleep    . Magnesium 500 MG CAPS Take 500 mg by mouth daily.    . nebivolol (BYSTOLIC) 10 MG tablet Take 1 tablet (10 mg total) by mouth daily. 30 tablet 11  . potassium chloride (K-DUR) 10 MEQ tablet Take 10 mEq by mouth daily.    . verapamil (CALAN-SR) 120 MG CR tablet Take 120 mg by mouth daily with lunch.    . warfarin (COUMADIN) 5 MG tablet Take 2.5-5 mg by mouth daily at 6 PM. 5 mg Sunday, Tuesday, Thursday, 2.5mg  Monday, Wednesday, Friday, Saturday     No current facility-administered medications for this visit.     Allergies:   Codeine; Penicillins; Tetracycline; Tramadol; Digoxin; Digoxin and related; Erythromycin; Other; Sulfa antibiotics; Tetracyclines & related; Diltiazem; Diltiazem hcl; and Sulfites    Social History:  The patient  reports that she has never smoked. She has never used smokeless tobacco. She reports that she drinks alcohol. She reports that she does not use drugs.   Family History:  The patient's family history includes CVA in her father; Cancer in her brother; Heart failure in her father; Hypertension in her sister; Lung cancer in her father; Sudden death in her brother; Transient ischemic attack in her mother.    ROS:  General:no colds or fevers, no weight changes Skin:no rashes or ulcers HEENT:no blurred vision, no congestion CV:see HPI PUL:see HPI GI:no diarrhea constipation or melena, no indigestion GU:no hematuria, no dysuria MS:no joint pain, no claudication Neuro:no syncope, no  lightheadedness Endo:no diabetes, no thyroid disease  Wt Readings from Last 3 Encounters:  07/19/16 208 lb (94.3 kg)  07/15/16 203 lb 4 oz (92.2 kg)  06/03/16 206 lb 12.8 oz (93.8 kg)     PHYSICAL EXAM: VS:  BP 124/84   Pulse 75   Ht 5\' 9"  (1.753 m)   Wt 208 lb (94.3 kg)   BMI 30.72 kg/m  , BMI Body mass index is 30.72 kg/m. General:Pleasant affect, NAD Skin:Warm and dry, brisk capillary refill HEENT:normocephalic, sclera clear, mucus membranes moist Neck:supple, no JVD, no bruits  Heart:S1S2 RRR without murmur, gallup, rub or click Lungs:clear without rales, rhonchi, or wheezes WUJ:WJXB, non tender, + BS, do  not palpate liver spleen or masses Ext:no lower ext edema, 2+ pedal pulses, 2+ radial pulses Neuro:alert and oriented X 3, MAE, follows commands, + facial symmetry    EKG:  EKG is ordered today. The ekg ordered today demonstrates SR with 1st degree AV block no changes, though 3 beats PAT   Recent Labs: 01/12/2016: BUN 13; Creat 0.93; Potassium 4.1; Sodium 138    Lipid Panel No results found for: CHOL, TRIG, HDL, CHOLHDL, VLDL, LDLCALC, LDLDIRECT     Other studies Reviewed: Additional studies/ records that were reviewed today include: .  ECHO Study Conclusions  - Left ventricle: The cavity size was normal. Wall thickness was   normal. Systolic function was normal. The estimated ejection   fraction was in the range of 55% to 60%. Wall motion was normal;   there were no regional wall motion abnormalities. Doppler   parameters are consistent with restrictive physiology, indicative   of decreased left ventricular diastolic compliance and/or   increased left atrial pressure. Doppler parameters are consistent   with high ventricular filling pressure. - Mitral valve: Calcified annulus. There was mild regurgitation. - Pulmonary arteries: Systolic pressure was mildly increased. PA   peak pressure: 36 mm Hg (S).  Impressions:  - Normal LV systolic function;  restrictive filling; mild MR; mild   TR; midly elevated pulmonary pressure.   ASSESSMENT AND PLAN:  1.  PAF vs PAT on EKG and at home.  She has tried most meds.  Discussed with DOD Dr. Ladona Ridgel and she has 3 options, continue current course and prn bystolic 5 mg, begin amiodarone- which she does not wish to do.  And AV node ablaltion with PPM- she chooses to  Continue current meds, verapmil, bystolic and prn bystolic.  She does have follow up with Dr. Ladona Ridgel in May.  If she has further problems she will call.  Will check BMP and Mg level.  2. Anticoagulation on coumadin.    3.  Chronic diastolic HF -euvolemic today.    4. HTN controlled.     Current medicines are reviewed with the patient today.  The patient Has no concerns regarding medicines.  The following changes have been made:  See above Labs/ tests ordered today include:see above  Disposition:   FU:  see above  Signed, Nada Boozer, NP  07/19/2016 2:25 PM     [facility] [address] [place], [place], [place]  [address]/ [address] [place], [place] Phone: [phone]; Fax: [phone]  [phone]

## 2016-07-19 NOTE — Patient Instructions (Addendum)
Medication Instructions:  1. CONTINUE YOUR BYSTOLIC 5 MG TWICE DAILY AS YOU CURRENTLY ARE; YOU MAY TAKE AN EXTRA BYSTOLIC 5 MG DAILY AS NEEDED;  Labwork: 1. TODAY BMET, MAGNESIUM LEVEL  Testing/Procedures: NONE ORDERED  Follow-Up: KEEP YOUR APPT WITH DR. Ladona Ridgel 09/07/16  Any Other Special Instructions Will Be Listed Below (If Applicable).     If you need a refill on your cardiac medications before your next appointment, please call your pharmacy.

## 2016-07-20 LAB — BASIC METABOLIC PANEL
BUN/Creatinine Ratio: 18 (ref 12–28)
BUN: 14 mg/dL (ref 8–27)
CO2: 26 mmol/L (ref 18–29)
CREATININE: 0.8 mg/dL (ref 0.57–1.00)
Calcium: 8.8 mg/dL (ref 8.7–10.3)
Chloride: 99 mmol/L (ref 96–106)
GFR, EST AFRICAN AMERICAN: 79 mL/min/{1.73_m2} (ref >59)
GFR, EST NON AFRICAN AMERICAN: 69 mL/min/{1.73_m2} (ref >59)
Glucose: 90 mg/dL (ref 65–99)
Potassium: 4.5 mmol/L (ref 3.5–5.2)
SODIUM: 140 mmol/L (ref 134–144)

## 2016-07-20 LAB — MAGNESIUM: Magnesium: 2.4 mg/dL — ABNORMAL HIGH (ref 1.6–2.3)

## 2016-07-21 NOTE — Addendum Note (Signed)
Addended by: Kerrie Buffalo on: 07/21/2016 09:08 AM   Modules accepted: Orders

## 2016-08-01 ENCOUNTER — Ambulatory Visit (INDEPENDENT_AMBULATORY_CARE_PROVIDER_SITE_OTHER): Payer: Medicare Other | Admitting: Pharmacist Clinician (PhC)/ Clinical Pharmacy Specialist

## 2016-08-01 DIAGNOSIS — I4891 Unspecified atrial fibrillation: Secondary | ICD-10-CM | POA: Diagnosis not present

## 2016-08-01 LAB — POCT INR: INR: 2.1

## 2016-08-10 ENCOUNTER — Telehealth: Payer: Self-pay | Admitting: Cardiology

## 2016-08-10 NOTE — Telephone Encounter (Signed)
New message     Pt thinks she is in AFIB and Tachycardia, she was sweating and her arms feel weak

## 2016-08-10 NOTE — Telephone Encounter (Signed)
Patient calling and states that she thinks that she is in Afib and that she feels her heart racing. She states that she has been having episodes off and on for the past three days but today is worse. She states that she is weak, was sweating, and has some intermittent pressure in her neck. Patient states that she has been SOB with activity. She denies any chest or arm pain. Patient states that she takes bystolic 10 mg QD and verapamil 120 mg QD. Patient states that she has taken an extra 5 mg of bystolic with not much relief of her symptoms. Patient did not have any BP or HR measurements. Patient states that she wants to be seen today in the office. No appointment available today, offered appointment for tomorrow with extender if she felt like she could wait until then. Advised patient to be seen in the ER in the meantime if her symptoms worsened. Patient states that she is starting to feel worse and that her symptoms are starting to worsen now. Advised for the patient to go to the ER room. Patient verbalized understanding and was in agreement with this plan.

## 2016-08-11 NOTE — Telephone Encounter (Signed)
Called patient to follow-up since she did not go to ED as instructed yesterday. She states she started to feel better - her BP increased and her HR decreased. Offered patient appointment for tomorrow for evaluation. She declined. Offered patient appointment Monday, Tuesday, and Wednesday. She declined stating she signed up for [employer] work. Reiterated to patient that she should take it easy and take care of herself instead of working. She is adamant she will not come until next Thursday. Confirmed with patient she is taking meds as directed. VVS today - BP 120s/60s and HR 74. Scheduled patient for evaluation next Thursday with [place], PA. Reiterated to patient to take medications as instructed and to proceed to ER if symptoms worsen again. She was grateful for call and agrees with treatment plan.

## 2016-08-17 NOTE — Progress Notes (Signed)
Cardiology Office Note    Date:  08/18/2016   ID:  Natasha [name], DOB [birth year], MRN [id number]  PCP:  Pearla Dubonnet, MD  Cardiologist:  Dr. Mayford Knife Electrophysiologist: Dr. Ladona Ridgel   Chief Complaint: Heart racing with hx of afib   History of Present Illness:   Natasha [name] is a 83 y.o. female  With hx of  afib RVR with 2 failed ablation in [facility] and multiple antiarrythmic (Multaq ?, Rythmol, flecaide, tikosyn, and pt refused amiodarone) , HTN, hypothyroidism, pulmonary hypertension and chronic diastolic dysfunction presents for afib.   Last seen by APP 07/19/16 for intermittent afib RVR. Discussed with DOD Dr. Ladona Ridgel. Given  3 options-->continue current course and prn bystolic 5 mg, begin amiodarone ( which she does not wish to do)  and AV node ablaltion with PPM. She chooses to continue current med with  verapmil, bystolic and prn bystolic. Advised to follow up with Dr. Ladona Ridgel 09/07/16.  Recently had intermittent palpitation and added to my schedule. Her main complain is fatigue and tiredness. On 4/26 she had worse palpitation took extra Bystolic leading to drop down of BP to 90s and she felt poor. Did not take any additional medication afterwards. She continues to feel poor. Here her HR is fluctuating between 90-130s.    Past Medical History:  Diagnosis Date  . Chronic diastolic heart failure (HCC) 04/22/2015  . Complication of anesthesia   . Diverticular disease   . DJD (degenerative joint disease)   . Dysphagia   . GERD (gastroesophageal reflux disease)   . Hiatal hernia   . Hypertension   . Hypothyroidism   . Lower leg edema   . PAF (paroxysmal atrial fibrillation) (HCC)    s/p ablation x several times now with occasional episodes of PAF.   She has failed sotolol/dofetilide/flecainide/propafenone/Amio and dronedarone either with not controlling her PAF or having side effects  . PONV (postoperative nausea and vomiting)   . Restless leg syndrome   . Seasonal allergies      Past Surgical History:  Procedure Laterality Date  . ABDOMINAL HYSTERECTOMY    . APPENDECTOMY    . ATRIAL FIBRILLATION ABLATION  09-06-13   2'14 -[facility]  . BALLOON DILATION  03/03/2011   Procedure: BALLOON DILATION;  Surgeon: Charolett Bumpers, MD;  Location: [facility] ENDOSCOPY;  Service: Endoscopy;  Laterality: N/A;  . bladder polyps    . BREAST SURGERY     left lumpectomy  . CARDIOVERSION     x3  . CARDIOVERSION N/A 12/30/2014   Procedure: CARDIOVERSION;  Surgeon: Lewayne Bunting, MD;  Location: [facility] ENDOSCOPY;  Service: Cardiovascular;  Laterality: N/A;  . CARDIOVERSION N/A 04/23/2015   Procedure: CARDIOVERSION;  Surgeon: Thurmon Fair, MD;  Location: [facility] ENDOSCOPY;  Service: Cardiovascular;  Laterality: N/A;  . CATARACT EXTRACTION Left   . COLONOSCOPY WITH PROPOFOL N/A 09/24/2013   Procedure: COLONOSCOPY WITH PROPOFOL;  Surgeon: Charolett Bumpers, MD;  Location: [facility] ENDOSCOPY;  Service: Endoscopy;  Laterality: N/A;  . ESOPHAGOGASTRODUODENOSCOPY  03/03/2011   Procedure: ESOPHAGOGASTRODUODENOSCOPY (EGD);  Surgeon: Charolett Bumpers, MD;  Location: [facility] ENDOSCOPY;  Service: Endoscopy;  Laterality: N/A;  . EYE SURGERY     cataracat  . RHINOPLASTY    . RIGHT HEART CATHETERIZATION N/A 07/11/2014   Procedure: RIGHT HEART CATH;  Surgeon: Laurey Morale, MD;  Location: [facility] CATH LAB;  Service: Cardiovascular;  Laterality: N/A;  . ROTATOR CUFF REPAIR    . THYROIDECTOMY  1973  . TONSILLECTOMY  1942  .  TUBAL LIGATION      Current Medications: Prior to Admission medications   Medication Sig Start Date End Date Taking? Authorizing Provider  acetaminophen (TYLENOL) 500 MG tablet Take 500 mg by mouth every 6 (six) hours as needed for mild pain or headache.    Historical Provider, MD  butalbital-acetaminophen-caffeine (FIORICET WITH CODEINE) 50-325-40-30 MG per capsule Take 1 capsule by mouth every 4 (four) hours as needed. For migraines    Historical Provider, MD  Calcium-Magnesium-Vitamin D  (CITRACAL CALCIUM+D) 600-40-500 MG-MG-UNIT TB24 Take 1 tablet by mouth daily.     Historical Provider, MD  chlorhexidine (PERIDEX) 0.12 % solution Use as directed 5 mLs in the mouth or throat daily as needed (gums).  04/13/16   Historical Provider, MD  Cholecalciferol (VITAMIN D) 2000 UNITS CAPS Take 2,000 Units by mouth daily. Reported on 04/22/2015    Historical Provider, MD  esomeprazole (NEXIUM) 20 MG capsule Take 20 mg by mouth every other day.     Historical Provider, MD  estradiol (ESTRACE) 1 MG tablet Take 1 mg by mouth at bedtime.     Historical Provider, MD  furosemide (LASIX) 40 MG tablet Take 40 mg by mouth daily.    Historical Provider, MD  levothyroxine (SYNTHROID, LEVOTHROID) 125 MCG tablet Take 125 mcg by mouth daily before breakfast. BRAND ONLY    Historical Provider, MD  LORazepam (ATIVAN) 1 MG tablet Take 0.5-1 mg by mouth at bedtime as needed for sleep. For sleep    Historical Provider, MD  Magnesium 500 MG CAPS Take 500 mg by mouth daily.    Historical Provider, MD  nebivolol (BYSTOLIC) 10 MG tablet Take 1 tablet (10 mg total) by mouth daily. Extra 1/2 tab as needed 07/19/16   Leone Brand, NP  potassium chloride (K-DUR) 10 MEQ tablet Take 10 mEq by mouth daily.    Historical Provider, MD  verapamil (CALAN-SR) 120 MG CR tablet Take 120 mg by mouth daily with lunch.    Historical Provider, MD  warfarin (COUMADIN) 5 MG tablet Take 2.5-5 mg by mouth daily at 6 PM. 5 mg Sunday, Tuesday, Thursday, 2.5mg  Monday, Wednesday, Friday, Saturday    Historical Provider, MD    Allergies:   Codeine; Penicillins; Tetracycline; Tramadol; Digoxin; Digoxin and related; Erythromycin; Other; Sulfa antibiotics; Tetracyclines & related; Diltiazem; Diltiazem hcl; and Sulfites   Social History   Social History  . Marital status: Married    Spouse name: N/A  . Number of children: N/A  . Years of education: N/A   Occupational History  . retired    Social History Main Topics  . Smoking status:  Never Smoker  . Smokeless tobacco: Never Used  . Alcohol use 0.0 oz/week     Comment: rare wine  . Drug use: No  . Sexual activity: No   Other Topics Concern  . None   Social History Narrative  . None     Family History:  The patient's family history includes CVA in her father; Cancer in her brother; Heart failure in her father; Hypertension in her sister; Lung cancer in her father; Sudden death in her brother; Transient ischemic attack in her mother.   ROS:   Please see the history of present illness.    ROS All other systems reviewed and are negative.   PHYSICAL EXAM:   VS:  BP 118/72   Pulse (!) 141   Ht 5\' 9"  (1.753 m)   Wt 204 lb 8 oz (92.8 kg)   SpO2 98%  BMI 30.20 kg/m    GEN: Well nourished, well developed, in no acute distress  HEENT: normal  Neck: no JVD, carotid bruits, or masses Cardiac: Irregular tachycardic, no murmurs, rubs, or gallops,no edema  Respiratory:  clear to auscultation bilaterally, normal work of breathing GI: soft, nontender, nondistended, + BS MS: no deformity or atrophy  Skin: warm and dry, no rash Neuro:  Alert and Oriented x 3, Strength and sensation are intact Psych: euthymic mood, full affect  Wt Readings from Last 3 Encounters:  08/18/16 204 lb 8 oz (92.8 kg)  07/19/16 208 lb (94.3 kg)  07/15/16 203 lb 4 oz (92.2 kg)      Studies/Labs Reviewed:   EKG:  EKG is ordered today.  The ekg ordered today demonstrates sinus rhythm with arrhythmias at rate of 93 bpm  Recent Labs: 07/19/2016: BUN 14; Creatinine, Ser 0.80; Magnesium 2.4; Potassium 4.5; Sodium 140   Lipid Panel No results found for: CHOL, TRIG, HDL, CHOLHDL, VLDL, LDLCALC, LDLDIRECT  Additional studies/ records that were reviewed today include:   Echocardiogram: 06/10/16 Study Conclusions  - Left ventricle: The cavity size was normal. Wall thickness was   normal. Systolic function was normal. The estimated ejection   fraction was in the range of 55% to 60%. Wall  motion was normal;   there were no regional wall motion abnormalities. Doppler   parameters are consistent with restrictive physiology, indicative   of decreased left ventricular diastolic compliance and/or   increased left atrial pressure. Doppler parameters are consistent   with high ventricular filling pressure. - Mitral valve: Calcified annulus. There was mild regurgitation. - Pulmonary arteries: Systolic pressure was mildly increased. PA   peak pressure: 36 mm Hg (S).  Impressions:  - Normal LV systolic function; restrictive filling; mild MR; mild   TR; midly elevated pulmonary pressure.   ASSESSMENT & PLAN:    1. PAF - Difficult to control. Previously failed ablation and antiarrhythmic. EKG shows sinus rhythm with marked arrhythmia. Heart rate is fluctuating between 90 to 130s during my exam. She is fatigued and tired. Intermittent palpitation. She is reluctant take extra Bystolic given episode of hypotension.  She is still not interested in amiodarone or ablation with pacemaker. Limited options. Discuss with Dr. Delton See (DOD). We have offered increasing verapamil to 180 mg daily, however, she declined. She will continue current regimen and follow-up in A. fib clinic tomorrow for further discussion on medical therapy. She will check her last TSH level and bring to clinic.   2. HTN - Well-controlled on current regimen.  3. Chronic diastolic CHF - Euvolemic. Continue current therapy.   Medication Adjustments/Labs and Tests Ordered: Current medicines are reviewed at length with the patient today.  Concerns regarding medicines are outlined above.  Medication changes, Labs and Tests ordered today are listed in the Patient Instructions below. Patient Instructions  Medication Instructions:  Your physician recommends that you continue on your current medications as directed. Please refer to the Current Medication list given to you today.   Labwork: None  ordered  Testing/Procedures: None ordered  Follow-Up: Your physician recommends that you schedule a follow-up appointment in:    Any Other Special Instructions Will Be Listed Below (If Applicable).     If you need a refill on your cardiac medications before your next appointment, please call your pharmacy.      Lorelei Pont, [place]  08/18/2016 9:13 AM    [facility] [address] [place], [place], [place]  [address] Phone: [phone]; Fax: [phone]

## 2016-08-18 ENCOUNTER — Encounter: Payer: Self-pay | Admitting: Physician Assistant

## 2016-08-18 ENCOUNTER — Ambulatory Visit (INDEPENDENT_AMBULATORY_CARE_PROVIDER_SITE_OTHER): Payer: Medicare Other | Admitting: Physician Assistant

## 2016-08-18 VITALS — BP 118/72 | HR 141 | Ht 69.0 in | Wt 204.5 lb

## 2016-08-18 DIAGNOSIS — I10 Essential (primary) hypertension: Secondary | ICD-10-CM | POA: Diagnosis not present

## 2016-08-18 DIAGNOSIS — I4819 Other persistent atrial fibrillation: Secondary | ICD-10-CM

## 2016-08-18 DIAGNOSIS — I272 Pulmonary hypertension, unspecified: Secondary | ICD-10-CM

## 2016-08-18 DIAGNOSIS — I481 Persistent atrial fibrillation: Secondary | ICD-10-CM

## 2016-08-18 DIAGNOSIS — I5032 Chronic diastolic (congestive) heart failure: Secondary | ICD-10-CM

## 2016-08-18 NOTE — Patient Instructions (Addendum)
Medication Instructions:  Your physician recommends that you continue on your current medications as directed. Please refer to the Current Medication list given to you today.   Labwork: None ordered  Testing/Procedures: None ordered  Follow-Up: Your physician recommends that you schedule a follow-up appointment in: 08/19/16 IN THE AFIB CLINIC.  THE PARKING CODE IS 6001.    Any Other Special Instructions Will Be Listed Below (If Applicable).   If you need a refill on your cardiac medications before your next appointment, please call your pharmacy.

## 2016-08-19 ENCOUNTER — Encounter (HOSPITAL_COMMUNITY): Payer: Self-pay | Admitting: Nurse Practitioner

## 2016-08-19 ENCOUNTER — Ambulatory Visit (HOSPITAL_COMMUNITY)
Admission: RE | Admit: 2016-08-19 | Discharge: 2016-08-19 | Disposition: A | Discharge status: Home / Self Care | Payer: Medicare Other | Source: Ambulatory Visit | Attending: Nurse Practitioner | Admitting: Nurse Practitioner

## 2016-08-19 VITALS — BP 106/64 | HR 86 | Ht 69.0 in | Wt 203.0 lb

## 2016-08-19 DIAGNOSIS — K449 Diaphragmatic hernia without obstruction or gangrene: Secondary | ICD-10-CM | POA: Diagnosis not present

## 2016-08-19 DIAGNOSIS — I110 Hypertensive heart disease with heart failure: Secondary | ICD-10-CM | POA: Insufficient documentation

## 2016-08-19 DIAGNOSIS — Z90710 Acquired absence of both cervix and uterus: Secondary | ICD-10-CM | POA: Insufficient documentation

## 2016-08-19 DIAGNOSIS — I5032 Chronic diastolic (congestive) heart failure: Secondary | ICD-10-CM | POA: Insufficient documentation

## 2016-08-19 DIAGNOSIS — Z882 Allergy status to sulfonamides status: Secondary | ICD-10-CM | POA: Insufficient documentation

## 2016-08-19 DIAGNOSIS — Z801 Family history of malignant neoplasm of trachea, bronchus and lung: Secondary | ICD-10-CM | POA: Diagnosis not present

## 2016-08-19 DIAGNOSIS — Z881 Allergy status to other antibiotic agents status: Secondary | ICD-10-CM | POA: Diagnosis not present

## 2016-08-19 DIAGNOSIS — I4892 Unspecified atrial flutter: Secondary | ICD-10-CM | POA: Diagnosis not present

## 2016-08-19 DIAGNOSIS — Z7901 Long term (current) use of anticoagulants: Secondary | ICD-10-CM | POA: Insufficient documentation

## 2016-08-19 DIAGNOSIS — Z880 Allergy status to penicillin: Secondary | ICD-10-CM | POA: Insufficient documentation

## 2016-08-19 DIAGNOSIS — Z9851 Tubal ligation status: Secondary | ICD-10-CM | POA: Diagnosis not present

## 2016-08-19 DIAGNOSIS — Z9889 Other specified postprocedural states: Secondary | ICD-10-CM | POA: Diagnosis not present

## 2016-08-19 DIAGNOSIS — I481 Persistent atrial fibrillation: Secondary | ICD-10-CM | POA: Insufficient documentation

## 2016-08-19 DIAGNOSIS — Z79899 Other long term (current) drug therapy: Secondary | ICD-10-CM | POA: Insufficient documentation

## 2016-08-19 DIAGNOSIS — K219 Gastro-esophageal reflux disease without esophagitis: Secondary | ICD-10-CM | POA: Diagnosis not present

## 2016-08-19 DIAGNOSIS — R002 Palpitations: Secondary | ICD-10-CM | POA: Diagnosis not present

## 2016-08-19 DIAGNOSIS — Z8249 Family history of ischemic heart disease and other diseases of the circulatory system: Secondary | ICD-10-CM | POA: Diagnosis not present

## 2016-08-19 DIAGNOSIS — Z888 Allergy status to other drugs, medicaments and biological substances status: Secondary | ICD-10-CM | POA: Diagnosis not present

## 2016-08-19 NOTE — Progress Notes (Signed)
Patient ID: Natasha [name], female   DOB: 1933-03-29, 83 y.o.   MRN: [id number]     Primary Care Physician: Pearla Dubonnet, MD Referring Physician: Elesa [address] office triage Cardiologist: Dr. Mayford Knife EP: Dr. Driscilla Moats is a 83 y.o. female with a h/o  persistent afib with ablation x2 at [facility] by Dr. Macon Large, seen in the afib clinic in September. . She was seen Dr. Macon Large in August and was in afib with RVR. She was offered a 3rd ablation which pt deferred so she was given multaq 400 mg bid to try. She took this and felt bad and was told to stop. She was then seen by Dr. Jens Som, being DOD, when she and her husband was in the coumadin clinic and she had afib with RVR. She was set up for cardioversion which unfortunately only lasted 7 days. She called the triage nurse this am to report afib with rvr and referred to the afib clinic for further evaluation.  Pt  has failed flecainide, multaq and tikosyn. She states that she is not a candidate for amiodarone due to presence of hypertension. Her BP is soft frequently so not a lot of room to increase rate control meds. She was on digoxin at one time years ago. She is considering going back to Dr. Macon Large for a third ablation and will make appointment. Another option may be AV node ablation/PPM.  She returns today f/u triage call due to return of rapid heart rate since last Friday. EKG show aflutter with RVR, around 140 bpm, which pt appears to be tolerating. She denies lightheadedness, feeling presyncopal. She does feel fatigued and short of breath. She is pending aa appointment with [facility] in one week with Dr. Macon Large. Apparently last time she spoke to Dr. Macon Large, herr options were AVnodal ablation/PPM or 3rd ablation, which pt wanted to think about. She also had a recent cardioversion 1/5, which held less than two weeks. She is very frustrated with her current heart issues.  F/u in afib clinic 5/6, she asked to be seen as  noticing more irregular heart beat this week and feared she was in afib. EKg shows SR with short runs of ? Atrial tachycardia.  Today, she denies symptoms of palpitations, chest pain, shortness of breath, orthopnea, PND, lower extremity edema, dizziness, presyncope, syncope, or neurologic sequela. The patient is tolerating medications without difficulties and is otherwise without complaint today.   Past Medical History:  Diagnosis Date  . Chronic diastolic heart failure (HCC) 04/22/2015  . Complication of anesthesia   . Diverticular disease   . DJD (degenerative joint disease)   . Dysphagia   . GERD (gastroesophageal reflux disease)   . Hiatal hernia   . Hypertension   . Hypothyroidism   . Lower leg edema   . PAF (paroxysmal atrial fibrillation) (HCC)    s/p ablation x several times now with occasional episodes of PAF.   She has failed sotolol/dofetilide/flecainide/propafenone/Amio and dronedarone either with not controlling her PAF or having side effects  . PONV (postoperative nausea and vomiting)   . Restless leg syndrome   . Seasonal allergies    Past Surgical History:  Procedure Laterality Date  . ABDOMINAL HYSTERECTOMY    . APPENDECTOMY    . ATRIAL FIBRILLATION ABLATION  09-06-13   2'14 -[facility]  . BALLOON DILATION  03/03/2011   Procedure: BALLOON DILATION;  Surgeon: Charolett Bumpers, MD;  Location: [facility] ENDOSCOPY;  Service: Endoscopy;  Laterality: N/A;  .  bladder polyps    . BREAST SURGERY     left lumpectomy  . CARDIOVERSION     x3  . CARDIOVERSION N/A 12/30/2014   Procedure: CARDIOVERSION;  Surgeon: Lewayne Bunting, MD;  Location: [facility] ENDOSCOPY;  Service: Cardiovascular;  Laterality: N/A;  . CARDIOVERSION N/A 04/23/2015   Procedure: CARDIOVERSION;  Surgeon: Thurmon Fair, MD;  Location: [facility] ENDOSCOPY;  Service: Cardiovascular;  Laterality: N/A;  . CATARACT EXTRACTION Left   . COLONOSCOPY WITH PROPOFOL N/A 09/24/2013   Procedure: COLONOSCOPY WITH PROPOFOL;  Surgeon:  Charolett Bumpers, MD;  Location: [facility] ENDOSCOPY;  Service: Endoscopy;  Laterality: N/A;  . ESOPHAGOGASTRODUODENOSCOPY  03/03/2011   Procedure: ESOPHAGOGASTRODUODENOSCOPY (EGD);  Surgeon: Charolett Bumpers, MD;  Location: [facility] ENDOSCOPY;  Service: Endoscopy;  Laterality: N/A;  . EYE SURGERY     cataracat  . RHINOPLASTY    . RIGHT HEART CATHETERIZATION N/A 07/11/2014   Procedure: RIGHT HEART CATH;  Surgeon: Laurey Morale, MD;  Location: [facility] CATH LAB;  Service: Cardiovascular;  Laterality: N/A;  . ROTATOR CUFF REPAIR    . THYROIDECTOMY  1973  . TONSILLECTOMY  1942  . TUBAL LIGATION      Current Outpatient Prescriptions  Medication Sig Dispense Refill  . acetaminophen (TYLENOL) 500 MG tablet Take 500 mg by mouth every 6 (six) hours as needed for mild pain or headache.    . butalbital-acetaminophen-caffeine (FIORICET WITH CODEINE) 50-325-40-30 MG per capsule Take 1 capsule by mouth every 4 (four) hours as needed. For migraines    . Calcium-Magnesium-Vitamin D (CITRACAL CALCIUM+D) 600-40-500 MG-MG-UNIT TB24 Take 1 tablet by mouth daily.     . chlorhexidine (PERIDEX) 0.12 % solution Use as directed 5 mLs in the mouth or throat daily as needed (gums).   2  . Cholecalciferol (VITAMIN D) 2000 UNITS CAPS Take 2,000 Units by mouth daily. Reported on 04/22/2015    . esomeprazole (NEXIUM) 20 MG capsule Take 20 mg by mouth every other day.     . estradiol (ESTRACE) 1 MG tablet Take 1 mg by mouth at bedtime.     . furosemide (LASIX) 40 MG tablet Take 40 mg by mouth daily.    Marland Kitchen levothyroxine (SYNTHROID, LEVOTHROID) 125 MCG tablet Take 125 mcg by mouth daily before breakfast. BRAND ONLY    . LORazepam (ATIVAN) 1 MG tablet Take 0.5-1 mg by mouth at bedtime as needed for sleep. For sleep    . Magnesium 500 MG CAPS Take 500 mg by mouth daily.    . nebivolol (BYSTOLIC) 10 MG tablet Take 1 tablet (10 mg total) by mouth daily. Extra 1/2 tab as needed 90 tablet 3  . potassium chloride (K-DUR) 10 MEQ tablet Take 10 mEq  by mouth daily.    . verapamil (CALAN-SR) 120 MG CR tablet Take 120 mg by mouth daily with lunch.    . warfarin (COUMADIN) 5 MG tablet Take 2.5-5 mg by mouth daily at 6 PM. 5 mg Sunday, Tuesday, Thursday, 2.5mg  Monday, Wednesday, Friday, Saturday     No current facility-administered medications for this encounter.     Allergies  Allergen Reactions  . Codeine Nausea And Vomiting and Nausea Only  . Penicillins Hives    Has patient had a PCN reaction causing immediate rash, facial/tongue/throat swelling, SOB or lightheadedness with hypotension: No Has patient had a PCN reaction causing severe rash involving mucus membranes or skin necrosis: No Has patient had a PCN reaction that required hospitalization No Has patient had a PCN reaction occurring within the  last 10 years: No If all of the above answers are "NO", then may proceed with Cephalosporin use.  . Tetracycline Rash  . Tramadol Nausea Only  . Digoxin Other (See Comments)    Flushed neck and face  . Digoxin And Related Other (See Comments)    ' Makes my face blood red"  . Erythromycin Other (See Comments) and Hives    Flushing on face  . Other Other (See Comments)    ECG leads cause skin irritation. ECG leads cause skin irritation.  . Sulfa Antibiotics Other (See Comments)    PRESERVATIVE OF SOME FOODS- MAKES FLUSHES TO FACE AND NECK  . Tetracyclines & Related Other (See Comments)    "Sores on legs"  . Diltiazem Rash and Other (See Comments)    Rash with long acting form  . Diltiazem Hcl Rash    Rash with long acting form  . Sulfites Rash    PRESERVATIVE OF SOME FOODS- MAKES FLUSHES TO FACE AND NECK PRESERVATIVE OF SOME FOODS- MAKES FLUSHES TO FACE AND NECK    Social History   Social History  . Marital status: Married    Spouse name: N/A  . Number of children: N/A  . Years of education: N/A   Occupational History  . retired    Social History Main Topics  . Smoking status: Never Smoker  . Smokeless tobacco:  Never Used  . Alcohol use 0.0 oz/week     Comment: rare wine  . Drug use: No  . Sexual activity: No   Other Topics Concern  . Not on file   Social History Narrative  . No narrative on file    Family History  Problem Relation Age of Onset  . Transient ischemic attack Mother   . Heart failure Father   . CVA Father   . Lung cancer Father   . Hypertension Sister   . Cancer Brother   . Sudden death Brother   . Anesthesia problems Neg Hx   . Hypotension Neg Hx   . Malignant hyperthermia Neg Hx   . Pseudochol deficiency Neg Hx   . Heart attack Neg Hx     ROS- All systems are reviewed and negative except as per the HPI above  Physical Exam: Vitals:   08/19/16 1016  BP: 106/64  Pulse: 86  Weight: 203 lb (92.1 kg)  Height: 5\' 9"  (1.753 m)    GEN- The patient is well appearing, alert and oriented x 3 today.   Head- normocephalic, atraumatic Eyes-  Sclera clear, conjunctiva pink Ears- hearing intact Oropharynx- clear Neck- supple, no JVP Lymph- no cervical lymphadenopathy Lungs- Clear to ausculation bilaterally, normal work of breathing Heart- Irregular rate and rhythm, no murmurs, rubs or gallops, PMI not laterally displaced GI- soft, NT, ND, + BS Extremities- no clubbing, cyanosis, or edema MS- no significant deformity or atrophy Skin- no rash or lesion Psych- euthymic mood, full affect Neuro- strength and sensation are intact  EKG-  Sinus rhythm at 86 bpm, pr int 232 ms, qrs int 62 bpm, qtc 423 ms, runs of atrial tach? Epic records reviewed  Assessment and Plan: 1. H/o persistent symptomatic afib/flutter/atrial tach with a complicated history and failure of antiarrythmic's/cardioversion's In SR today with runs of fast rhythm  Very complicated pt, cannot increase rate control meds because of frequent soft BP She does have 30 mg cardizem if needed, if she has a systolic BP over 409 mg, can use. Was seen by Dr. Ladona Ridgel in the recent past  and was offered amio, or AV  nodal ablation with PPM or another ablation. She has hesitations re these options then and now. She mentioned flecainide but has a first degree block and I have hesitation with use of this drug She also mentioned trying tikosyn again but also mentions she is trying to stay out of the hospital until January so she can change insurance She is also having teeth work done and would like to finish this before taking on changes for her heart condition So I did not have a good option for her today but she is happy finding out that she is not in afib/flutter and thought possibly needed a cardioversion   F/u with Dr. Ladona Ridgel 5/23    [name] [address] [place], [place] [address] [phone]

## 2016-08-22 ENCOUNTER — Encounter: Payer: Self-pay | Admitting: Internal Medicine

## 2016-09-07 ENCOUNTER — Encounter: Payer: Self-pay | Admitting: Internal Medicine

## 2016-09-07 ENCOUNTER — Ambulatory Visit (INDEPENDENT_AMBULATORY_CARE_PROVIDER_SITE_OTHER): Payer: Medicare Other | Admitting: Internal Medicine

## 2016-09-07 VITALS — BP 132/82 | HR 72 | Ht 69.0 in | Wt 203.0 lb

## 2016-09-07 DIAGNOSIS — I10 Essential (primary) hypertension: Secondary | ICD-10-CM | POA: Diagnosis not present

## 2016-09-07 DIAGNOSIS — R002 Palpitations: Secondary | ICD-10-CM | POA: Diagnosis not present

## 2016-09-07 DIAGNOSIS — I5032 Chronic diastolic (congestive) heart failure: Secondary | ICD-10-CM

## 2016-09-07 DIAGNOSIS — I481 Persistent atrial fibrillation: Secondary | ICD-10-CM | POA: Diagnosis not present

## 2016-09-07 DIAGNOSIS — I4819 Other persistent atrial fibrillation: Secondary | ICD-10-CM

## 2016-09-07 NOTE — Patient Instructions (Addendum)
Medication Instructions:  Your physician recommends that you continue on your current medications as directed. Please refer to the Current Medication list given to you today.   Labwork: None Ordered   Testing/Procedures: None Ordered    Follow-Up: Your physician recommends that you schedule a follow-up appointment in: 3 months with Dr. Taylor   Any Other Special Instructions Will Be Listed Below (If Applicable).     If you need a refill on your cardiac medications before your next appointment, please call your pharmacy.   

## 2016-09-07 NOTE — Progress Notes (Signed)
Cardiology Office Note    Date:  09/07/2016   ID:  Natasha [name], DOB 12-27-1932, MRN [id number]  PCP:  Marden Noble, MD  Cardiologist:  Dr. Mayford Knife Electrophysiologist: Dr. Ladona Ridgel   Chief Complaint: Heart racing with hx of afib   History of Present Illness:   Natasha [name] is a 83 y.o. female  With hx of  afib RVR with 2 failed ablation in [facility] and multiple antiarrythmic (Multaq ?, Rythmol, flecaide, tikosyn, and pt refused amiodarone) drugs in the past, with HTN, hypothyroidism, pulmonary hypertension and chronic diastolic dysfunction.   In the interim, she has continued to have episodes of atrial fib with a RVR while at other times NSR. She feels some weakness and fatigue when she is in rhythm. Her blood pressure on medical therapy tends to run low. No frank syncope. No edema.  Past Medical History:  Diagnosis Date  . Chronic diastolic heart failure (HCC) 04/22/2015  . Complication of anesthesia   . Diverticular disease   . DJD (degenerative joint disease)   . Dysphagia   . GERD (gastroesophageal reflux disease)   . Hiatal hernia   . Hypertension   . Hypothyroidism   . Lower leg edema   . PAF (paroxysmal atrial fibrillation) (HCC)    s/p ablation x several times now with occasional episodes of PAF.   She has failed sotolol/dofetilide/flecainide/propafenone/Amio and dronedarone either with not controlling her PAF or having side effects  . PONV (postoperative nausea and vomiting)   . Restless leg syndrome   . Seasonal allergies     Past Surgical History:  Procedure Laterality Date  . ABDOMINAL HYSTERECTOMY    . APPENDECTOMY    . ATRIAL FIBRILLATION ABLATION  09-06-13   2'14 -[facility]  . BALLOON DILATION  03/03/2011   Procedure: BALLOON DILATION;  Surgeon: Charolett Bumpers, MD;  Location: [facility] ENDOSCOPY;  Service: Endoscopy;  Laterality: N/A;  . bladder polyps    . BREAST SURGERY     left lumpectomy  . CARDIOVERSION     x3  . CARDIOVERSION N/A 12/30/2014   Procedure: CARDIOVERSION;  Surgeon: Lewayne Bunting, MD;  Location: [facility] ENDOSCOPY;  Service: Cardiovascular;  Laterality: N/A;  . CARDIOVERSION N/A 04/23/2015   Procedure: CARDIOVERSION;  Surgeon: Thurmon Fair, MD;  Location: [facility] ENDOSCOPY;  Service: Cardiovascular;  Laterality: N/A;  . CATARACT EXTRACTION Left   . COLONOSCOPY WITH PROPOFOL N/A 09/24/2013   Procedure: COLONOSCOPY WITH PROPOFOL;  Surgeon: Charolett Bumpers, MD;  Location: [facility] ENDOSCOPY;  Service: Endoscopy;  Laterality: N/A;  . ESOPHAGOGASTRODUODENOSCOPY  03/03/2011   Procedure: ESOPHAGOGASTRODUODENOSCOPY (EGD);  Surgeon: Charolett Bumpers, MD;  Location: [facility] ENDOSCOPY;  Service: Endoscopy;  Laterality: N/A;  . EYE SURGERY     cataracat  . RHINOPLASTY    . RIGHT HEART CATHETERIZATION N/A 07/11/2014   Procedure: RIGHT HEART CATH;  Surgeon: Laurey Morale, MD;  Location: [facility] CATH LAB;  Service: Cardiovascular;  Laterality: N/A;  . ROTATOR CUFF REPAIR    . THYROIDECTOMY  1973  . TONSILLECTOMY  1942  . TUBAL LIGATION      Current Medications: Prior to Admission medications   Medication Sig Start Date End Date Taking? Authorizing Provider  acetaminophen (TYLENOL) 500 MG tablet Take 500 mg by mouth every 6 (six) hours as needed for mild pain or headache.    Historical Provider, MD  butalbital-acetaminophen-caffeine (FIORICET WITH CODEINE) 50-325-40-30 MG per capsule Take 1 capsule by mouth every 4 (four) hours as needed. For migraines  Historical Provider, MD  Calcium-Magnesium-Vitamin D (CITRACAL CALCIUM+D) 600-40-500 MG-MG-UNIT TB24 Take 1 tablet by mouth daily.     Historical Provider, MD  chlorhexidine (PERIDEX) 0.12 % solution Use as directed 5 mLs in the mouth or throat daily as needed (gums).  04/13/16   Historical Provider, MD  Cholecalciferol (VITAMIN D) 2000 UNITS CAPS Take 2,000 Units by mouth daily. Reported on 04/22/2015    Historical Provider, MD  esomeprazole (NEXIUM) 20 MG capsule Take 20 mg by mouth every other day.      Historical Provider, MD  estradiol (ESTRACE) 1 MG tablet Take 1 mg by mouth at bedtime.     Historical Provider, MD  furosemide (LASIX) 40 MG tablet Take 40 mg by mouth daily.    Historical Provider, MD  levothyroxine (SYNTHROID, LEVOTHROID) 125 MCG tablet Take 125 mcg by mouth daily before breakfast. BRAND ONLY    Historical Provider, MD  LORazepam (ATIVAN) 1 MG tablet Take 0.5-1 mg by mouth at bedtime as needed for sleep. For sleep    Historical Provider, MD  Magnesium 500 MG CAPS Take 500 mg by mouth daily.    Historical Provider, MD  nebivolol (BYSTOLIC) 10 MG tablet Take 1 tablet (10 mg total) by mouth daily. Extra 1/2 tab as needed 07/19/16   Leone Brand, NP  potassium chloride (K-DUR) 10 MEQ tablet Take 10 mEq by mouth daily.    Historical Provider, MD  verapamil (CALAN-SR) 120 MG CR tablet Take 120 mg by mouth daily with lunch.    Historical Provider, MD  warfarin (COUMADIN) 5 MG tablet Take 2.5-5 mg by mouth daily at 6 PM. 5 mg Sunday, Tuesday, Thursday, 2.5mg  Monday, Wednesday, Friday, Saturday    Historical Provider, MD    Allergies:   Codeine; Penicillins; Tetracycline; Tramadol; Digoxin; Digoxin and related; Erythromycin; Other; Sulfa antibiotics; Tetracyclines & related; Diltiazem; Diltiazem hcl; and Sulfites   Social History   Social History  . Marital status: Married    Spouse name: N/A  . Number of children: N/A  . Years of education: N/A   Occupational History  . retired    Social History Main Topics  . Smoking status: Never Smoker  . Smokeless tobacco: Never Used  . Alcohol use 0.0 oz/week     Comment: rare wine  . Drug use: No  . Sexual activity: No   Other Topics Concern  . None   Social History Narrative  . None     Family History:  The patient's family history includes CVA in her father; Cancer in her brother; Heart failure in her father; Hypertension in her sister; Lung cancer in her father; Sudden death in her brother; Transient ischemic attack in her  mother.   ROS:   Please see the history of present illness.    ROS All other systems reviewed and are negative.   PHYSICAL EXAM:   VS:  BP 132/82   Pulse 72   Ht 5\' 9"  (1.753 m)   Wt 203 lb (92.1 kg)   SpO2 96%   BMI 29.98 kg/m    GEN: Well nourished, well developed, in no acute distress  HEENT: normal  Neck: 6 cm JVP, carotid bruits, or masses Cardiac: regular bradycardia, no murmurs, rubs, or gallops,no edema  Respiratory:  clear to auscultation bilaterally, normal work of breathing GI: soft, nontender, nondistended, + BS MS: no deformity or atrophy  Skin: warm and dry, no rash Neuro:  Alert and Oriented x 3, Strength and sensation are intact Psych: euthymic  mood, full affect  Wt Readings from Last 3 Encounters:  09/07/16 203 lb (92.1 kg)  08/19/16 203 lb (92.1 kg)  08/18/16 204 lb 8 oz (92.8 kg)      Studies/Labs Reviewed:   EKG:  EKG is ordered today which demonstrates NSR with first degree AV block Recent Labs: 07/19/2016: BUN 14; Creatinine, Ser 0.80; Magnesium 2.4; Potassium 4.5; Sodium 140   Lipid Panel No results found for: CHOL, TRIG, HDL, CHOLHDL, VLDL, LDLCALC, LDLDIRECT  Additional studies/ records that were reviewed today include:   Echocardiogram: 06/10/16 Study Conclusions  - Left ventricle: The cavity size was normal. Wall thickness was   normal. Systolic function was normal. The estimated ejection   fraction was in the range of 55% to 60%. Wall motion was normal;   there were no regional wall motion abnormalities. Doppler   parameters are consistent with restrictive physiology, indicative   of decreased left ventricular diastolic compliance and/or   increased left atrial pressure. Doppler parameters are consistent   with high ventricular filling pressure. - Mitral valve: Calcified annulus. There was mild regurgitation. - Pulmonary arteries: Systolic pressure was mildly increased. PA   peak pressure: 36 mm Hg (S).  Impressions:  - Normal  LV systolic function; restrictive filling; mild MR; mild   TR; midly elevated pulmonary pressure.   ASSESSMENT & PLAN:    1. PAF - Difficult to control. Previously failed ablation twice with continued recurrent atrial. EKG shows sinus rhythm today at 67/min. I have recommended AV node ablation and insertion of a His bundle PPM. She will call us after she finishes her dental work if she wishes to consider the procedure above.  2. HTN - Well-controlled on current regimen.  3. Chronic diastolic CHF - Euvolemic. Continue current therapy.  Signed, Lewayne Bunting, MD  09/07/2016 2:48 PM    [facility] [address] [place], [place], [place]  [address] Phone: [phone]; Fax: [phone]

## 2016-09-13 ENCOUNTER — Ambulatory Visit (INDEPENDENT_AMBULATORY_CARE_PROVIDER_SITE_OTHER): Payer: Medicare Other | Admitting: Pharmacist Clinician (PhC)/ Clinical Pharmacy Specialist

## 2016-09-13 DIAGNOSIS — I4891 Unspecified atrial fibrillation: Secondary | ICD-10-CM

## 2016-09-13 LAB — POCT INR: INR: 2.8

## 2016-09-28 ENCOUNTER — Other Ambulatory Visit: Payer: Self-pay | Admitting: Cardiology

## 2016-10-25 ENCOUNTER — Ambulatory Visit (INDEPENDENT_AMBULATORY_CARE_PROVIDER_SITE_OTHER): Payer: Medicare Other | Admitting: Pharmacist

## 2016-10-25 DIAGNOSIS — I4891 Unspecified atrial fibrillation: Secondary | ICD-10-CM

## 2016-10-25 LAB — POCT INR: INR: 2.8

## 2016-11-04 DIAGNOSIS — H04123 Dry eye syndrome of bilateral lacrimal glands: Secondary | ICD-10-CM | POA: Diagnosis not present

## 2016-11-18 DIAGNOSIS — E039 Hypothyroidism, unspecified: Secondary | ICD-10-CM | POA: Diagnosis not present

## 2016-11-22 DIAGNOSIS — I4891 Unspecified atrial fibrillation: Secondary | ICD-10-CM | POA: Diagnosis not present

## 2016-11-22 DIAGNOSIS — E039 Hypothyroidism, unspecified: Secondary | ICD-10-CM | POA: Diagnosis not present

## 2016-11-22 DIAGNOSIS — E063 Autoimmune thyroiditis: Secondary | ICD-10-CM | POA: Diagnosis not present

## 2016-12-06 ENCOUNTER — Ambulatory Visit (INDEPENDENT_AMBULATORY_CARE_PROVIDER_SITE_OTHER): Payer: Medicare Other | Admitting: Pharmacist

## 2016-12-06 DIAGNOSIS — I4819 Other persistent atrial fibrillation: Secondary | ICD-10-CM

## 2016-12-06 DIAGNOSIS — I481 Persistent atrial fibrillation: Secondary | ICD-10-CM

## 2016-12-06 LAB — PROTIME-INR: INR: 2.9 — AB (ref <=1.1)

## 2016-12-07 ENCOUNTER — Other Ambulatory Visit: Payer: Self-pay | Admitting: Cardiology

## 2016-12-13 ENCOUNTER — Ambulatory Visit (INDEPENDENT_AMBULATORY_CARE_PROVIDER_SITE_OTHER): Payer: Medicare Other | Admitting: Internal Medicine

## 2016-12-13 ENCOUNTER — Encounter: Payer: Self-pay | Admitting: Internal Medicine

## 2016-12-13 VITALS — BP 112/72 | HR 72 | Ht 69.0 in | Wt 201.0 lb

## 2016-12-13 DIAGNOSIS — I4891 Unspecified atrial fibrillation: Secondary | ICD-10-CM | POA: Diagnosis not present

## 2016-12-13 NOTE — Patient Instructions (Signed)

## 2016-12-13 NOTE — Progress Notes (Signed)
HPI Mr. Karg returns today for followup. She is a very pleasant 83 year old woman with a history of persistent atrial fibrillation, status post catheter ablation 2 at [facility]. She has had recurrent atrial fibrillation and left atrial flutter. She has been treated with a combination of calcium channel blockers and beta blockers with reasonable control. I last saw her 3 months ago and at that time we discussed AV node ablation and insertion of a his bundle pacemaker. In the interim she has been stable. Her heart racing has been reasonably well controlled although she does have palpitations. She has undergone dental work and this is continuing. She has not had syncope. She has minimal peripheral edema. She has had problems with fatigue and weakness on her medications and wonders if she can reduce the dose somewhat. Allergies  Allergen Reactions  . Codeine Nausea And Vomiting and Nausea Only  . Penicillins Hives    Has patient had a PCN reaction causing immediate rash, facial/tongue/throat swelling, SOB or lightheadedness with hypotension: No Has patient had a PCN reaction causing severe rash involving mucus membranes or skin necrosis: No Has patient had a PCN reaction that required hospitalization No Has patient had a PCN reaction occurring within the last 10 years: No If all of the above answers are "NO", then may proceed with Cephalosporin use.  . Tetracycline Rash  . Tramadol Nausea Only  . Digoxin Other (See Comments)    Flushed neck and face  . Digoxin And Related Other (See Comments)    ' Makes my face blood red"  . Erythromycin Other (See Comments) and Hives    Flushing on face  . Other Other (See Comments)    ECG leads cause skin irritation. ECG leads cause skin irritation.  . Sulfa Antibiotics Other (See Comments)    PRESERVATIVE OF SOME FOODS- MAKES FLUSHES TO FACE AND NECK  . Tetracyclines & Related Other (See Comments)    "Sores on legs"  .  Diltiazem Rash and Other (See Comments)    Rash with long acting form  . Diltiazem Hcl Rash    Rash with long acting form  . Sulfites Rash    PRESERVATIVE OF SOME FOODS- MAKES FLUSHES TO FACE AND NECK PRESERVATIVE OF SOME FOODS- MAKES FLUSHES TO FACE AND NECK     Current Outpatient Prescriptions  Medication Sig Dispense Refill  . acetaminophen (TYLENOL) 500 MG tablet Take 500 mg by mouth every 6 (six) hours as needed for mild pain or headache.    . butalbital-acetaminophen-caffeine (FIORICET WITH CODEINE) 50-325-40-30 MG per capsule Take 1 capsule by mouth every 4 (four) hours as needed. For migraines    . Calcium-Magnesium-Vitamin D (CITRACAL CALCIUM+D) 600-40-500 MG-MG-UNIT TB24 Take 1 tablet by mouth daily.     . chlorhexidine (PERIDEX) 0.12 % solution Use as directed 5 mLs in the mouth or throat daily as needed (gums).   2  . Cholecalciferol (VITAMIN D) 2000 UNITS CAPS Take 2,000 Units by mouth daily. Reported on 04/22/2015    . esomeprazole (NEXIUM) 20 MG capsule Take 20 mg by mouth every 3 (three) days.     Marland Kitchen estradiol (ESTRACE) 1 MG tablet Take 1 mg by mouth at bedtime.     . furosemide (LASIX) 40 MG tablet Take 1 tablet (40 mg total) by mouth daily. 90 tablet 3  . levothyroxine (SYNTHROID, LEVOTHROID) 125 MCG tablet Take 125 mcg by mouth daily before breakfast. BRAND ONLY    . LORazepam (ATIVAN) 1  MG tablet Take 0.5-1 mg by mouth at bedtime as needed for sleep. For sleep    . Magnesium 500 MG CAPS Take 500 mg by mouth daily.    . nebivolol (BYSTOLIC) 10 MG tablet Take 1 tablet (10 mg total) by mouth daily. Extra 1/2 tab as needed 90 tablet 3  . potassium chloride (K-DUR) 10 MEQ tablet Take 10 mEq by mouth daily.    . verapamil (CALAN-SR) 120 MG CR tablet Take 120 mg by mouth daily with lunch.    . warfarin (COUMADIN) 5 MG tablet Take 2.5-5 mg by mouth daily at 6 PM. 5 mg Sunday, Tuesday, Thursday, 2.5mg  Monday, Wednesday, Friday, Saturday     No current facility-administered  medications for this visit.      Past Medical History:  Diagnosis Date  . Chronic diastolic heart failure (HCC) 04/22/2015  . Complication of anesthesia   . Diverticular disease   . DJD (degenerative joint disease)   . Dysphagia   . GERD (gastroesophageal reflux disease)   . Hiatal hernia   . Hypertension   . Hypothyroidism   . Lower leg edema   . PAF (paroxysmal atrial fibrillation) (HCC)    s/p ablation x several times now with occasional episodes of PAF.   She has failed sotolol/dofetilide/flecainide/propafenone/Amio and dronedarone either with not controlling her PAF or having side effects  . PONV (postoperative nausea and vomiting)   . Restless leg syndrome   . Seasonal allergies     ROS:   All systems reviewed and negative except as noted in the HPI.   Past Surgical History:  Procedure Laterality Date  . ABDOMINAL HYSTERECTOMY    . APPENDECTOMY    . ATRIAL FIBRILLATION ABLATION  09-06-13   2'14 [facility]  . BALLOON DILATION  03/03/2011   Procedure: BALLOON DILATION;  Surgeon: Charolett Bumpers, MD;  Location: [facility]COPY;  Service: Endoscopy;  Laterality: N/A;  . bladder polyps    . BREAST SURGERY     left lumpectomy  . CARDIOVERSION     x3  . CARDIOVERSION N/A 12/30/2014   Procedure: CARDIOVERSION;  Surgeon: Lewayne Bunting, MD;  Location: [facility] ENDOSCOPY;  Service: Cardiovascular;  Laterality: N/A;  . CARDIOVERSION N/A 04/23/2015   Procedure: CARDIOVERSION;  Surgeon: Thurmon Fair, MD;  Location: [facility] ENDOSCOPY;  Service: Cardiovascular;  Laterality: N/A;  . CATARACT EXTRACTION Left   . COLONOSCOPY WITH PROPOFOL N/A 09/24/2013   Procedure: COLONOSCOPY WITH PROPOFOL;  Surgeon: Charolett Bumpers, MD;  Location: [facility] ENDOSCOPY;  Service: Endoscopy;  Laterality: N/A;  . ESOPHAGOGASTRODUODENOSCOPY  03/03/2011   Procedure: ESOPHAGOGASTRODUODENOSCOPY (EGD);  Surgeon: Charolett Bumpers, MD;  Location: [facility] ENDOSCOPY;  Service: Endoscopy;  Laterality: N/A;  . EYE SURGERY      cataracat  . RHINOPLASTY    . RIGHT HEART CATHETERIZATION N/A 07/11/2014   Procedure: RIGHT HEART CATH;  Surgeon: Laurey Morale, MD;  Location: [facility] CATH LAB;  Service: Cardiovascular;  Laterality: N/A;  . ROTATOR CUFF REPAIR    . THYROIDECTOMY  1973  . TONSILLECTOMY  1942  . TUBAL LIGATION       Family History  Problem Relation Age of Onset  . Transient ischemic attack Mother   . Heart failure Father   . CVA Father   . Lung cancer Father   . Hypertension Sister   . Cancer Brother   . Sudden death Brother   . Anesthesia problems Neg Hx   . Hypotension Neg Hx   . Malignant hyperthermia Neg  Hx   . Pseudochol deficiency Neg Hx   . Heart attack Neg Hx      Social History   Social History  . Marital status: Married    Spouse name: N/A  . Number of children: N/A  . Years of education: N/A   Occupational History  . retired    Social History Main Topics  . Smoking status: Never Smoker  . Smokeless tobacco: Never Used  . Alcohol use 0.0 oz/week     Comment: rare wine  . Drug use: No  . Sexual activity: No   Other Topics Concern  . Not on file   Social History Narrative  . No narrative on file     BP 112/72   Pulse 72   Ht 5\' 9"  (1.753 m)   Wt 201 lb (91.2 kg)   SpO2 97%   BMI 29.68 kg/m   Physical Exam:  Well appearing 83 year old woman, NAD HEENT: Unremarkable Neck:  7 cm JVD, no thyromegally Lymphatics:  No adenopathy Back:  No CVA tenderness Lungs:  Clear, with normal breath sounds, no wheezes, no rales. HEART:  IRegular rate rhythm, no murmurs, no rubs, no clicks Abd:  soft, positive bowel sounds, no organomegally, no rebound, no guarding Ext:  2 plus pulses, no edema, no cyanosis, no clubbing Skin:  No rashes no nodules Neuro:  CN II through XII intact, motor grossly intact  EKG - sinus rhythm with frequent PACs   Assess/Plan: 1. Atrial fibrillation - she is doing reasonably well and her rhythm is fairly well controlled. We discussed  reduction of the dose of her Bystolic or verapamil. If she has worsening symptoms, we discussed AV node ablation and His bundle pacemaker insertion. She will undergo watchful waiting. 2. Fatigue and weakness - I suspect this is multifactorial but may be in part worsened by her medical therapy for her atrial fibrillation. We discussed reduction in the dose and she will try holding Bystolic evening dose for now. 3. Chronic diastolic heart failure - she is encouraged to maintain a low-sodium diet. She currently is taking Lasix 40 mg daily. She will continue this.

## 2016-12-28 ENCOUNTER — Other Ambulatory Visit: Payer: Self-pay | Admitting: *Deleted

## 2016-12-28 MED ORDER — WARFARIN SODIUM 5 MG PO TABS: ORAL_TABLET | ORAL | 0 refills | Status: DC

## 2017-01-10 ENCOUNTER — Ambulatory Visit (INDEPENDENT_AMBULATORY_CARE_PROVIDER_SITE_OTHER): Payer: Medicare Other | Admitting: Cardiology

## 2017-01-10 ENCOUNTER — Encounter: Payer: Self-pay | Admitting: Cardiology

## 2017-01-10 VITALS — BP 130/82 | HR 62 | Ht 69.0 in | Wt 200.6 lb

## 2017-01-10 DIAGNOSIS — I5032 Chronic diastolic (congestive) heart failure: Secondary | ICD-10-CM

## 2017-01-10 DIAGNOSIS — I481 Persistent atrial fibrillation: Secondary | ICD-10-CM

## 2017-01-10 DIAGNOSIS — R0602 Shortness of breath: Secondary | ICD-10-CM

## 2017-01-10 DIAGNOSIS — I10 Essential (primary) hypertension: Secondary | ICD-10-CM | POA: Diagnosis not present

## 2017-01-10 DIAGNOSIS — R5382 Chronic fatigue, unspecified: Secondary | ICD-10-CM | POA: Insufficient documentation

## 2017-01-10 DIAGNOSIS — I4819 Other persistent atrial fibrillation: Secondary | ICD-10-CM

## 2017-01-10 NOTE — Progress Notes (Signed)
Cardiology Office Note:    Date:  01/10/2017   ID:  Natasha [name], DOB [birth year], MRN [id number]  PCP:  Marden Noble, MD  Cardiologist:  Armanda Magic, MD   Referring MD: Marden Noble, MD   Chief Complaint  Patient presents with  . Atrial Fibrillation  . Hypertension  . Congestive Heart Failure    History of Present Illness:    Natasha [name] is a 83 y.o. female with a hx of persistent atrial fibrillation with 2 failed ablations at [facility] and multiple antiarrythmic intolerances (Multaq , Rythmol, flecaide, tikosyn, and pt refused amiodarone) , HTN, hypothyroidism, mild pulmonary hypertension ( ) and chronic diastolic dysfunction.    She was seen by the  APP 07/19/16 for intermittent afib RVR and discussion with Dr. Ladona Ridgel with EP recommended 3 options-->continue current course and prn bystolic 5 mg, begin amiodarone ( which she does not wish to do) and AV node ablaltion with PPM. She chose to continue current med with verapmil, bystolic and prn bystolic. She was seen back by Dr. Ladona Ridgel and was having fatigue that was felt to possibly be due to her afib meds and her evening dose of Bystolic was stopped but she decided not to stop it.    She is here today for followup.  She denies any chest pain or pressure,  PND, orthopnea, LE edema, dizziness or syncope. She is compliant with her meds and is tolerating meds with no SE.  She is still having problems with palpitations that are sporadic and she says skips all over the place.  She is still complaining of severe fatigue.  She continues to have DOE when walking in her house and is getting very frustrated.  Despite all the recommendations from EP she still cannot make up a decision on proceeding with AVN ablation and PPM   Past Medical History:  Diagnosis Date  . Chronic diastolic heart failure (HCC) 04/22/2015  . Complication of anesthesia   . Diverticular disease   . DJD (degenerative joint disease)   . Dysphagia   . GERD  (gastroesophageal reflux disease)   . Hiatal hernia   . Hypertension   . Hypothyroidism   . Lower leg edema   . PAF (paroxysmal atrial fibrillation) (HCC)    s/p ablation x several times now with occasional episodes of PAF.   She has failed sotolol/dofetilide/flecainide/propafenone/Amio and dronedarone either with not controlling her PAF or having side effects  . PONV (postoperative nausea and vomiting)   . Restless leg syndrome   . Seasonal allergies     Past Surgical History:  Procedure Laterality Date  . ABDOMINAL HYSTERECTOMY    . APPENDECTOMY    . ATRIAL FIBRILLATION ABLATION  09-06-13   2'14 -[facility]  . BALLOON DILATION  03/03/2011   Procedure: BALLOON DILATION;  Surgeon: Charolett Bumpers, MD;  Location: [facility] ENDOSCOPY;  Service: Endoscopy;  Laterality: N/A;  . bladder polyps    . BREAST SURGERY     left lumpectomy  . CARDIOVERSION     x3  . CARDIOVERSION N/A 12/30/2014   Procedure: CARDIOVERSION;  Surgeon: Lewayne Bunting, MD;  Location: [facility] ENDOSCOPY;  Service: Cardiovascular;  Laterality: N/A;  . CARDIOVERSION N/A 04/23/2015   Procedure: CARDIOVERSION;  Surgeon: Thurmon Fair, MD;  Location: [facility] ENDOSCOPY;  Service: Cardiovascular;  Laterality: N/A;  . CATARACT EXTRACTION Left   . COLONOSCOPY WITH PROPOFOL N/A 09/24/2013   Procedure: COLONOSCOPY WITH PROPOFOL;  Surgeon: Charolett Bumpers, MD;  Location: [facility] ENDOSCOPY;  Service:  Endoscopy;  Laterality: N/A;  . ESOPHAGOGASTRODUODENOSCOPY  03/03/2011   Procedure: ESOPHAGOGASTRODUODENOSCOPY (EGD);  Surgeon: Charolett Bumpers, MD;  Location: [facility] ENDOSCOPY;  Service: Endoscopy;  Laterality: N/A;  . EYE SURGERY     cataracat  . RHINOPLASTY    . RIGHT HEART CATHETERIZATION N/A 07/11/2014   Procedure: RIGHT HEART CATH;  Surgeon: Laurey Morale, MD;  Location: [facility] CATH LAB;  Service: Cardiovascular;  Laterality: N/A;  . ROTATOR CUFF REPAIR    . THYROIDECTOMY  1973  . TONSILLECTOMY  1942  . TUBAL LIGATION      Current  Medications: Current Meds  Medication Sig  . acetaminophen (TYLENOL) 500 MG tablet Take 500 mg by mouth every 6 (six) hours as needed for mild pain or headache.  . butalbital-acetaminophen-caffeine (FIORICET WITH CODEINE) 50-325-40-30 MG per capsule Take 1 capsule by mouth every 4 (four) hours as needed. For migraines  . Calcium-Magnesium-Vitamin D (CITRACAL CALCIUM+D) 600-40-500 MG-MG-UNIT TB24 Take 1 tablet by mouth daily.   . chlorhexidine (PERIDEX) 0.12 % solution Use as directed 5 mLs in the mouth or throat daily as needed (gums).   . Cholecalciferol (VITAMIN D) 2000 UNITS CAPS Take 2,000 Units by mouth daily. Reported on 04/22/2015  . esomeprazole (NEXIUM) 20 MG capsule Take 20 mg by mouth every 3 (three) days.   Marland Kitchen estradiol (ESTRACE) 1 MG tablet Take 1 mg by mouth at bedtime.   . furosemide (LASIX) 40 MG tablet Take 1 tablet (40 mg total) by mouth daily.  Marland Kitchen levothyroxine (SYNTHROID, LEVOTHROID) 125 MCG tablet Take 125 mcg by mouth daily before breakfast. BRAND ONLY  . LORazepam (ATIVAN) 1 MG tablet Take 0.5-1 mg by mouth at bedtime as needed for sleep. For sleep  . Magnesium 500 MG CAPS Take 500 mg by mouth daily.  . nebivolol (BYSTOLIC) 10 MG tablet Take 1 tablet (10 mg total) by mouth daily. Extra 1/2 tab as needed  . potassium chloride (K-DUR) 10 MEQ tablet Take 10 mEq by mouth daily.  . verapamil (CALAN-SR) 120 MG CR tablet Take 120 mg by mouth daily with lunch.  . warfarin (COUMADIN) 5 MG tablet Take 1/2 to 1 tablet daily as directed by coumadin clinic     Allergies:   Codeine; Penicillins; Tetracycline; Tramadol; Digoxin; Digoxin and related; Erythromycin; Other; Sulfa antibiotics; Tetracyclines & related; Diltiazem; Diltiazem hcl; and Sulfites   Social History   Social History  . Marital status: Married    Spouse name: N/A  . Number of children: N/A  . Years of education: N/A   Occupational History  . retired    Social History Main Topics  . Smoking status: Never Smoker    . Smokeless tobacco: Never Used  . Alcohol use 0.0 oz/week     Comment: rare wine  . Drug use: No  . Sexual activity: No   Other Topics Concern  . None   Social History Narrative  . None     Family History: The patient's family history includes CVA in her father; Cancer in her brother; Heart failure in her father; Hypertension in her sister; Lung cancer in her father; Sudden death in her brother; Transient ischemic attack in her mother. There is no history of Anesthesia problems, Hypotension, Malignant hyperthermia, Pseudochol deficiency, or Heart attack.  ROS:   Please see the history of present illness.    ROS  All other systems reviewed and negative.   EKGs/Labs/Other Studies Reviewed:    The following studies were reviewed today: Office notes in [employer]  from EP  EKG:  EKG is not ordered today.    Recent Labs: 07/19/2016: BUN 14; Creatinine, Ser 0.80; Magnesium 2.4; Potassium 4.5; Sodium 140   Recent Lipid Panel No results found for: CHOL, TRIG, HDL, CHOLHDL, VLDL, LDLCALC, LDLDIRECT  Physical Exam:    VS:  BP 130/82   Pulse 62   Ht  (1.753 m)   Wt 200 lb 9.6 oz (91 kg)   SpO2 98%   BMI 29.62 kg/m     Wt Readings from Last 3 Encounters:  01/10/17 200 lb 9.6 oz (91 kg)  12/13/16 201 lb (91.2 kg)  09/07/16 203 lb (92.1 kg)     GEN:  Well nourished, well developed in no acute distress HEENT: Normal NECK: No JVD; No carotid bruits LYMPHATICS: No lymphadenopathy CARDIAC: RRR, no murmurs, rubs, gallops RESPIRATORY:  Clear to auscultation without rales, wheezing or rhonchi  ABDOMEN: Soft, non-tender, non-distended MUSCULOSKELETAL:  No edema; No deformity  SKIN: Warm and dry NEUROLOGIC:  Alert and oriented x 3 PSYCHIATRIC:  Normal affect   ASSESSMENT:    1. Persistent atrial fibrillation (HCC)   2. Chronic diastolic heart failure (HCC)   3. Essential hypertension   4. Shortness of breath   5. Chronic fatigue    PLAN:    In order of problems listed  above:  1.  Persistent atrial fibrillation - she has failed 2 afib ablations and multiple AAD secondary to intolerance or nonresponder.  She was recently see by EP and complaining of fatigue and her Bystolic pm dose was stopped.  She was offered AVN ablation with PPM, continue current meds with PRN bystolic but given her complaints of fatigue it was recommended that her evening Bystolic be stopped. She decided not to follow Dr. Bruna Potter advice and did not change her medications so she is still complaining of severe fatigue and palpitations.  She cannot decide about moving forward with AVN ablation and PPM.  She asked me again whether she should go back to [facility] for another ablation and I instructed her to talk with Dr. Ladona Ridgel but likely she was not a candidate.  She will continue on Bystolic  daily with PRN dosing for breakthrough palpitations and Verapamil as well as warfarin.  2.  Chronic diastolic CHF - she appears euvolemic on exam today.  Her weight is stable.  She will continue on Lasix  daily.   3.  HTN - her BP is well controlled on exam.  She will continue on Verapamil  daily and Bystolic  daily.    4.  Chronic fatigue which is likely multifactorial from sedentary state, aging, afib and side effects of heart failure meds.  I also question whether there is underlying depression as well.  I will check a CBC, BMET today. She says that her TSH was recently checked and was normal.    Medication Adjustments/Labs and Tests Ordered: Current medicines are reviewed at length with the patient today.  Concerns regarding medicines are outlined above.  Orders Placed This Encounter  Procedures  . Basic metabolic panel  . Magnesium  . CBC with Differential/Platelet  . Pro b natriuretic peptide (BNP)  . Myocardial Perfusion Imaging   No orders of the defined types were placed in this encounter.   Signed, Armanda Magic, MD  01/10/2017 3:12 PM    [facility]

## 2017-01-10 NOTE — Patient Instructions (Signed)
Medication Instructions:  Your provider recommends that you continue on your current medications as directed. Please refer to the Current Medication list given to you today.    Labwork: TODAY: BMET, Mag, CBC, BNP  Testing/Procedures: Your provider has requested that you have a lexiscan myoview. For further information please visit [email or web address] Please follow instruction sheet, as given.  Follow-Up: Your provider recommends that you schedule a follow-up appointment with DR. Ladona Ridgel.  Your provider wants you to follow-up in: 6 months with Dr. Norris Cross assistant. You will receive a reminder letter in the mail two months in advance. If you don't receive a letter, please call our office to schedule the follow-up appointment.   Your provider wants you to follow-up in: 1 year with Dr. Mayford Knife. You will receive a reminder letter in the mail two months in advance. If you don't receive a letter, please call our office to schedule the follow-up appointment.     Any Other Special Instructions Will Be Listed Below (If Applicable).     If you need a refill on your cardiac medications before your next appointment, please call your pharmacy.

## 2017-01-11 LAB — BASIC METABOLIC PANEL
BUN/Creatinine Ratio: 18 (ref 12–28)
BUN: 14 mg/dL (ref 8–27)
CALCIUM: 9.1 mg/dL (ref 8.7–10.3)
CO2: 24 mmol/L (ref 20–29)
CREATININE: 0.8 mg/dL (ref 0.57–1.00)
Chloride: 100 mmol/L (ref 96–106)
GFR calc Af Amer: 79 mL/min/{1.73_m2} (ref >59)
GFR, EST NON AFRICAN AMERICAN: 69 mL/min/{1.73_m2} (ref >59)
Glucose: 101 mg/dL — ABNORMAL HIGH (ref 65–99)
Potassium: 4.5 mmol/L (ref 3.5–5.2)
SODIUM: 140 mmol/L (ref 134–144)

## 2017-01-11 LAB — CBC WITH DIFFERENTIAL/PLATELET
BASOS: 0 %
Basophils Absolute: 0 10*3/uL (ref 0.0–0.2)
EOS (ABSOLUTE): 0.1 10*3/uL (ref 0.0–0.4)
EOS: 1 %
HEMATOCRIT: 41.5 % (ref 34.0–46.6)
HEMOGLOBIN: 14.7 g/dL (ref 11.1–15.9)
Immature Grans (Abs): 0 10*3/uL (ref 0.0–0.1)
Immature Granulocytes: 0 %
Lymphocytes Absolute: 1.5 10*3/uL (ref 0.7–3.1)
Lymphs: 29 %
MCH: 33 pg (ref 26.6–33.0)
MCHC: 35.4 g/dL (ref 31.5–35.7)
MCV: 93 fL (ref 79–97)
MONOCYTES: 12 %
Monocytes Absolute: 0.7 10*3/uL (ref 0.1–0.9)
NEUTROS PCT: 58 %
Neutrophils Absolute: 3 10*3/uL (ref 1.4–7.0)
Platelets: 233 10*3/uL (ref 150–379)
RBC: 4.45 x10E6/uL (ref 3.77–5.28)
RDW: 13.2 % (ref 12.3–15.4)
WBC: 5.3 10*3/uL (ref 3.4–10.8)

## 2017-01-11 LAB — MAGNESIUM: Magnesium: 2.2 mg/dL (ref 1.6–2.3)

## 2017-01-11 LAB — PRO B NATRIURETIC PEPTIDE: NT-Pro BNP: 278 pg/mL (ref 0–738)

## 2017-01-16 ENCOUNTER — Ambulatory Visit (INDEPENDENT_AMBULATORY_CARE_PROVIDER_SITE_OTHER): Payer: Medicare Other | Admitting: Pharmacist

## 2017-01-16 DIAGNOSIS — I481 Persistent atrial fibrillation: Secondary | ICD-10-CM

## 2017-01-16 DIAGNOSIS — Z7901 Long term (current) use of anticoagulants: Secondary | ICD-10-CM

## 2017-01-16 DIAGNOSIS — I4819 Other persistent atrial fibrillation: Secondary | ICD-10-CM

## 2017-01-16 LAB — POCT INR: INR: 2.5

## 2017-01-17 DIAGNOSIS — Z23 Encounter for immunization: Secondary | ICD-10-CM | POA: Diagnosis not present

## 2017-02-01 ENCOUNTER — Encounter (HOSPITAL_COMMUNITY): Payer: Medicare Other

## 2017-02-22 ENCOUNTER — Other Ambulatory Visit: Payer: Self-pay | Admitting: Cardiology

## 2017-02-27 ENCOUNTER — Telehealth (HOSPITAL_COMMUNITY): Payer: Self-pay | Admitting: *Deleted

## 2017-02-27 ENCOUNTER — Ambulatory Visit (INDEPENDENT_AMBULATORY_CARE_PROVIDER_SITE_OTHER): Payer: Medicare Other | Admitting: Pharmacist Clinician (PhC)/ Clinical Pharmacy Specialist

## 2017-02-27 DIAGNOSIS — I481 Persistent atrial fibrillation: Secondary | ICD-10-CM

## 2017-02-27 DIAGNOSIS — Z7901 Long term (current) use of anticoagulants: Secondary | ICD-10-CM

## 2017-02-27 DIAGNOSIS — I4819 Other persistent atrial fibrillation: Secondary | ICD-10-CM

## 2017-02-27 LAB — POCT INR: INR: 4.1

## 2017-02-27 NOTE — Telephone Encounter (Signed)
Left message on voicemail per DPR in reference to upcoming appointment scheduled on 03/01/17 with detailed instructions given per Myocardial Perfusion Study Information Sheet for the test. LM to arrive 15 minutes early, and that it is imperative to arrive on time for appointment to keep from having the test rescheduled. If you need to cancel or reschedule your appointment, please call the office within 24 hours of your appointment. Failure to do so may result in a cancellation of your appointment, and a $50 no show fee. Phone number given for call back for any questions. Alayssa Flinchum Jacqueline    

## 2017-02-28 ENCOUNTER — Telehealth (HOSPITAL_COMMUNITY): Payer: Self-pay | Admitting: *Deleted

## 2017-02-28 NOTE — Telephone Encounter (Signed)
Patient given detailed instructions per Myocardial Perfusion Study Information Sheet for the test on 03/03/17 at 0915. Patient notified to arrive 15 minutes early and that it is imperative to arrive on time for appointment to keep from having the test rescheduled.  If you need to cancel or reschedule your appointment, please call the office within 24 hours of your appointment. . Patient verbalized understanding.Natasha [name], [name]

## 2017-03-01 ENCOUNTER — Encounter (HOSPITAL_COMMUNITY): Payer: Medicare Other

## 2017-03-03 ENCOUNTER — Encounter (HOSPITAL_COMMUNITY): Payer: Medicare Other

## 2017-03-08 ENCOUNTER — Ambulatory Visit (HOSPITAL_COMMUNITY): Payer: Medicare Other | Attending: Internal Medicine

## 2017-03-08 DIAGNOSIS — R0602 Shortness of breath: Secondary | ICD-10-CM | POA: Insufficient documentation

## 2017-03-08 LAB — MYOCARDIAL PERFUSION IMAGING
CHL CUP NUCLEAR SDS: 5
CHL CUP RESTING HR STRESS: 62 {beats}/min
LHR: 0.31
LV dias vol: 67 mL (ref 46–106)
LV sys vol: 14 mL
NUC STRESS TID: 0.83
Peak HR: 76 {beats}/min
SRS: 4
SSS: 9

## 2017-03-08 MED ORDER — TECHNETIUM TC 99M TETROFOSMIN IV KIT
10.1000 | PACK | Freq: Once | INTRAVENOUS | Status: AC | PRN
Start: 2017-03-08 — End: 2017-03-08
  Administered 2017-03-08: 10.1 via INTRAVENOUS
  Filled 2017-03-08: qty 11

## 2017-03-08 MED ORDER — TECHNETIUM TC 99M TETROFOSMIN IV KIT
32.6000 | PACK | Freq: Once | INTRAVENOUS | Status: AC | PRN
  Administered 2017-03-08: 32.6 via INTRAVENOUS
  Filled 2017-03-08: qty 33

## 2017-03-08 MED ORDER — REGADENOSON 0.4 MG/5ML IV SOLN
0.4000 mg | Freq: Once | INTRAVENOUS | Status: AC
  Administered 2017-03-08: 0.4 mg via INTRAVENOUS

## 2017-03-13 ENCOUNTER — Ambulatory Visit (INDEPENDENT_AMBULATORY_CARE_PROVIDER_SITE_OTHER): Payer: Medicare Other | Admitting: Pharmacist Clinician (PhC)/ Clinical Pharmacy Specialist

## 2017-03-13 DIAGNOSIS — Z7901 Long term (current) use of anticoagulants: Secondary | ICD-10-CM

## 2017-03-13 DIAGNOSIS — I481 Persistent atrial fibrillation: Secondary | ICD-10-CM | POA: Diagnosis not present

## 2017-03-13 DIAGNOSIS — I4819 Other persistent atrial fibrillation: Secondary | ICD-10-CM

## 2017-03-13 LAB — POCT INR: INR: 2.3

## 2017-04-06 ENCOUNTER — Ambulatory Visit (INDEPENDENT_AMBULATORY_CARE_PROVIDER_SITE_OTHER): Payer: Medicare Other | Admitting: Pharmacist

## 2017-04-06 DIAGNOSIS — I481 Persistent atrial fibrillation: Secondary | ICD-10-CM

## 2017-04-06 DIAGNOSIS — Z7901 Long term (current) use of anticoagulants: Secondary | ICD-10-CM

## 2017-04-06 DIAGNOSIS — I4819 Other persistent atrial fibrillation: Secondary | ICD-10-CM

## 2017-04-06 LAB — POCT INR: INR: 3.4

## 2017-04-19 DIAGNOSIS — M25569 Pain in unspecified knee: Secondary | ICD-10-CM | POA: Diagnosis not present

## 2017-04-19 DIAGNOSIS — R1011 Right upper quadrant pain: Secondary | ICD-10-CM | POA: Diagnosis not present

## 2017-04-20 ENCOUNTER — Other Ambulatory Visit: Payer: Self-pay | Admitting: Internal Medicine

## 2017-04-20 ENCOUNTER — Ambulatory Visit
Admission: RE | Admit: 2017-04-20 | Discharge: 2017-04-20 | Disposition: A | Discharge status: Home / Self Care | Payer: Medicare Other | Source: Ambulatory Visit | Attending: Internal Medicine | Admitting: Internal Medicine

## 2017-04-20 DIAGNOSIS — R1011 Right upper quadrant pain: Secondary | ICD-10-CM

## 2017-04-20 DIAGNOSIS — K449 Diaphragmatic hernia without obstruction or gangrene: Secondary | ICD-10-CM | POA: Diagnosis not present

## 2017-04-24 DIAGNOSIS — M1712 Unilateral primary osteoarthritis, left knee: Secondary | ICD-10-CM | POA: Diagnosis not present

## 2017-04-24 DIAGNOSIS — M1711 Unilateral primary osteoarthritis, right knee: Secondary | ICD-10-CM | POA: Diagnosis not present

## 2017-04-24 DIAGNOSIS — M25561 Pain in right knee: Secondary | ICD-10-CM | POA: Diagnosis not present

## 2017-04-24 DIAGNOSIS — M25562 Pain in left knee: Secondary | ICD-10-CM | POA: Diagnosis not present

## 2017-04-25 ENCOUNTER — Telehealth: Payer: Self-pay | Admitting: Cardiology

## 2017-04-25 NOTE — Telephone Encounter (Signed)
New message     Patient calling with concerns about Voltaren gel  use while on Coumadin. Patient request to speak with Coumadin Clinic  Please call  Pt c/o medication issue:  1. Name of Medication: Voltaren gel  2. How are you currently taking this medication (dosage and times per day)? As prescribed   3. Are you having a reaction (difficulty breathing--STAT)?No  4. What is your medication issue?  Patient wants to confirm safe to use while on warfarin (COUMADIN) 5 MG tablet

## 2017-04-25 NOTE — Telephone Encounter (Signed)
New Message   Patient last seen 9/18  1. What dental office are you calling from? [facility]  2. What is your office phone and fax number? [phone]  Fax [phone]   3. What type of procedure is the patient having performed?  Implants   4. What date is procedure scheduled or is the patient there now? Not scheduled yet   5. What is your question (ex. Antibiotics prior to procedure, holding medication-we need to know how long dentist wants pt to hold med)?  warfarin (COUMADIN) 5 MG tablet Take 1/2 to 1 tablet daily as directed by coumadin clinic    Does patient need premed?

## 2017-04-25 NOTE — Telephone Encounter (Signed)
Patient with diagnosis of Afib on warfarin for anticoagulation.    Procedure: dental implants Date of procedure: pending  CHADS2-VASc score of  5 (CHF, HTN, AGE, DM2, stroke/tia x 2, CAD, AGE, female)  Per office protocol, patient can hold warfarin for 5 days prior to procedure.   Patient will not need bridging with Lovenox (enoxaparin) around procedure.  Patient should restart warfarin on the evening of procedure or day after, at discretion of procedure MD.   Patient does not have indication for SBE prophylaxis per our records.

## 2017-04-25 NOTE — Telephone Encounter (Signed)
Spoke with patient, assured her that Voltaren Gel is fine to use with warfarin.

## 2017-04-26 NOTE — Telephone Encounter (Signed)
Please see recommendations by anticoagulation clinic. Letter faxed to requesting provider. Please contact requesting provider to make sure fax received. Tereso Newcomer, PA-C    04/26/2017 3:49 PM

## 2017-04-26 NOTE — Telephone Encounter (Signed)
[facility] CONFIRMED FAX  RECIEVED

## 2017-05-08 ENCOUNTER — Other Ambulatory Visit: Payer: Self-pay | Admitting: Internal Medicine

## 2017-05-08 ENCOUNTER — Ambulatory Visit (INDEPENDENT_AMBULATORY_CARE_PROVIDER_SITE_OTHER): Payer: Medicare Other | Admitting: Pharmacist Clinician (PhC)/ Clinical Pharmacy Specialist

## 2017-05-08 DIAGNOSIS — Z9229 Personal history of other drug therapy: Secondary | ICD-10-CM

## 2017-05-08 DIAGNOSIS — I4819 Other persistent atrial fibrillation: Secondary | ICD-10-CM

## 2017-05-08 DIAGNOSIS — I481 Persistent atrial fibrillation: Secondary | ICD-10-CM

## 2017-05-08 LAB — POCT INR: INR: 1.6

## 2017-05-08 NOTE — Patient Instructions (Signed)
Description   Take 1.5 tablets today Monday Jan 21, then continue with 1 tablet each Sunday, Tuesday and Thursday, 1/2 tablet all other days.  Repeat INR in 4 weeks

## 2017-05-17 ENCOUNTER — Other Ambulatory Visit: Payer: Self-pay | Admitting: Internal Medicine

## 2017-05-17 DIAGNOSIS — Z139 Encounter for screening, unspecified: Secondary | ICD-10-CM

## 2017-05-22 DIAGNOSIS — M170 Bilateral primary osteoarthritis of knee: Secondary | ICD-10-CM | POA: Diagnosis not present

## 2017-05-23 ENCOUNTER — Ambulatory Visit (INDEPENDENT_AMBULATORY_CARE_PROVIDER_SITE_OTHER)
Admission: RE | Admit: 2017-05-23 | Discharge: 2017-05-23 | Disposition: A | Discharge status: Home / Self Care | Payer: Medicare Other | Source: Ambulatory Visit | Attending: Internal Medicine | Admitting: Internal Medicine

## 2017-05-23 DIAGNOSIS — R911 Solitary pulmonary nodule: Secondary | ICD-10-CM | POA: Diagnosis not present

## 2017-05-25 ENCOUNTER — Encounter (HOSPITAL_COMMUNITY): Payer: Self-pay | Admitting: Emergency Medicine

## 2017-05-25 ENCOUNTER — Emergency Department (HOSPITAL_COMMUNITY)
Admission: EM | Admit: 2017-05-25 | Discharge: 2017-05-25 | Disposition: A | Discharge status: Home / Self Care | Payer: Medicare Other | Attending: Emergency Medicine | Admitting: Emergency Medicine

## 2017-05-25 ENCOUNTER — Other Ambulatory Visit: Payer: Self-pay

## 2017-05-25 ENCOUNTER — Emergency Department (HOSPITAL_COMMUNITY): Payer: Medicare Other

## 2017-05-25 DIAGNOSIS — R0602 Shortness of breath: Secondary | ICD-10-CM | POA: Insufficient documentation

## 2017-05-25 DIAGNOSIS — I5032 Chronic diastolic (congestive) heart failure: Secondary | ICD-10-CM | POA: Insufficient documentation

## 2017-05-25 DIAGNOSIS — Z79899 Other long term (current) drug therapy: Secondary | ICD-10-CM | POA: Insufficient documentation

## 2017-05-25 DIAGNOSIS — I471 Supraventricular tachycardia: Secondary | ICD-10-CM | POA: Diagnosis not present

## 2017-05-25 DIAGNOSIS — E039 Hypothyroidism, unspecified: Secondary | ICD-10-CM | POA: Insufficient documentation

## 2017-05-25 DIAGNOSIS — I480 Paroxysmal atrial fibrillation: Secondary | ICD-10-CM | POA: Insufficient documentation

## 2017-05-25 DIAGNOSIS — I4892 Unspecified atrial flutter: Secondary | ICD-10-CM | POA: Diagnosis not present

## 2017-05-25 DIAGNOSIS — Z7901 Long term (current) use of anticoagulants: Secondary | ICD-10-CM | POA: Insufficient documentation

## 2017-05-25 DIAGNOSIS — R002 Palpitations: Secondary | ICD-10-CM | POA: Diagnosis not present

## 2017-05-25 DIAGNOSIS — I110 Hypertensive heart disease with heart failure: Secondary | ICD-10-CM | POA: Insufficient documentation

## 2017-05-25 LAB — I-STAT TROPONIN, ED: Troponin i, poc: 0 ng/mL (ref 0.00–0.08)

## 2017-05-25 LAB — PROTIME-INR
INR: 1.93
PROTHROMBIN TIME: 21.9 s — AB (ref 11.4–15.2)

## 2017-05-25 LAB — CBC
HEMATOCRIT: 45.3 % (ref 36.0–46.0)
Hemoglobin: 16.1 g/dL — ABNORMAL HIGH (ref 12.0–15.0)
MCH: 34.1 pg — ABNORMAL HIGH (ref 26.0–34.0)
MCHC: 35.5 g/dL (ref 30.0–36.0)
MCV: 96 fL (ref 78.0–100.0)
Platelets: 255 10*3/uL (ref 150–400)
RBC: 4.72 MIL/uL (ref 3.87–5.11)
RDW: 13.1 % (ref 11.5–15.5)
WBC: 7.3 10*3/uL (ref 4.0–10.5)

## 2017-05-25 LAB — BASIC METABOLIC PANEL
Anion gap: 11 (ref 5–15)
BUN: 13 mg/dL (ref 6–20)
CHLORIDE: 105 mmol/L (ref 101–111)
CO2: 19 mmol/L — ABNORMAL LOW (ref 22–32)
Calcium: 8.6 mg/dL — ABNORMAL LOW (ref 8.9–10.3)
Creatinine, Ser: 0.85 mg/dL (ref 0.44–1.00)
GFR calc Af Amer: >60 mL/min (ref >60)
GFR calc non Af Amer: >60 mL/min (ref >60)
Glucose, Bld: 124 mg/dL — ABNORMAL HIGH (ref 65–99)
POTASSIUM: 4.1 mmol/L (ref 3.5–5.1)
SODIUM: 135 mmol/L (ref 135–145)

## 2017-05-25 NOTE — Discharge Instructions (Signed)
As we discussed, you can take an extra dose of Coumadin today, and go back to normal dosing tomorrow.  Please follow-up in the Coumadin clinic on February 11 for recheck.

## 2017-05-25 NOTE — ED Notes (Signed)
ED Provider at bedside. 

## 2017-05-25 NOTE — ED Provider Notes (Signed)
This patients CHA2DS2-VASc Score and unadjusted Ischemic Stroke Rate (% per year) is equal to 7.2 % stroke rate/year from a score of 5  Above score calculated as 1 point each if present [CHF, HTN, DM, Vascular=MI/PAD/Aortic Plaque, Age if 65-74, or Female] Above score calculated as 2 points each if present [Age > 75, or Stroke/TIA/TE]     Zadie Rhine, MD 05/25/17 [phone]

## 2017-05-25 NOTE — ED Provider Notes (Addendum)
[facility] EMERGENCY DEPARTMENT Provider Note   CSN: [id number] Arrival date & time: 05/25/17  0321     History   Chief Complaint Chief Complaint  Patient presents with  . Palpitations    HPI Natasha [name] is a 84 y.o. female.  The history is provided by the patient.  Palpitations   This is a new problem. Episode onset: Several hours ago. The problem occurs constantly. The problem has been gradually worsening. Associated symptoms include shortness of breath. Pertinent negatives include no diaphoresis, no fever, no chest pain, no syncope and no vomiting. She has tried nothing for the symptoms.  Patient with history of atrial fibrillation s/p ablation and multiple cardioversions, history of hypertension, history of diastolic heart failure presents with palpitations.  She reports it started several hours ago.  She felt well going to bed, but then woke up with the symptoms.  No chest pain.  She reports mild shortness of breath and generalized weakness.  No syncope.  This is similar to prior episodes.  No other acute issues at this time.  Past Medical History:  Diagnosis Date  . Chronic diastolic heart failure (HCC) 04/22/2015  . Complication of anesthesia   . Diverticular disease   . DJD (degenerative joint disease)   . Dysphagia   . GERD (gastroesophageal reflux disease)   . Hiatal hernia   . Hypertension   . Hypothyroidism   . Lower leg edema   . PAF (paroxysmal atrial fibrillation) (HCC)    s/p ablation x several times now with occasional episodes of PAF.   She has failed sotolol/dofetilide/flecainide/propafenone/Amio and dronedarone either with not controlling her PAF or having side effects  . PONV (postoperative nausea and vomiting)   . Restless leg syndrome   . Seasonal allergies     Patient Active Problem List   Diagnosis Date Noted  . Chronic fatigue 01/10/2017  . Chronic diastolic heart failure (HCC) 04/22/2015  . Dyspnea 09/07/2014  . Nodule of  right lung 09/07/2014  . Pulmonary hypertension (HCC) 07/02/2014  . SOB (shortness of breath) 12/12/2013  . Femoral bruit 08/27/2013  . Claudication (HCC) 08/02/2013  . GERD (gastroesophageal reflux disease)   . Hypertension   . Lower leg edema   . Hypothyroidism   . Restless leg syndrome   . DJD (degenerative joint disease)   . Diverticular disease   . PONV (postoperative nausea and vomiting)   . Hiatal hernia   . History of anticoagulant therapy 05/18/2011  . 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 History of migraine headaches 05/18/2011  . Persistent atrial fibrillation (HCC) 05/18/2011  . Dysphagia 03/03/2011    Past Surgical History:  Procedure Laterality Date  . ABDOMINAL HYSTERECTOMY    . APPENDECTOMY    . ATRIAL FIBRILLATION ABLATION  09-06-13   2'14 -[facility]  . BALLOON DILATION  03/03/2011   Procedure: BALLOON DILATION;  Surgeon: Charolett Bumpers, MD;  Location: [facility] ENDOSCOPY;  Service: Endoscopy;  Laterality: N/A;  . bladder polyps    . BREAST SURGERY     left lumpectomy  . CARDIOVERSION     x3  . CARDIOVERSION N/A 12/30/2014   Procedure: CARDIOVERSION;  Surgeon: Lewayne Bunting, MD;  Location: [facility] ENDOSCOPY;  Service: Cardiovascular;  Laterality: N/A;  . CARDIOVERSION N/A 04/23/2015   Procedure: CARDIOVERSION;  Surgeon: Thurmon Fair, MD;  Location: [facility] ENDOSCOPY;  Service: Cardiovascular;  Laterality: N/A;  . CATARACT EXTRACTION Left   . COLONOSCOPY WITH PROPOFOL N/A 09/24/2013   Procedure:  COLONOSCOPY WITH PROPOFOL;  Surgeon: Charolett Bumpers, MD;  Location: [facility] ENDOSCOPY;  Service: Endoscopy;  Laterality: N/A;  . ESOPHAGOGASTRODUODENOSCOPY  03/03/2011   Procedure: ESOPHAGOGASTRODUODENOSCOPY (EGD);  Surgeon: Charolett Bumpers, MD;  Location: [facility] ENDOSCOPY;  Service: Endoscopy;  Laterality: N/A;  . EYE SURGERY     cataracat  . RHINOPLASTY    . RIGHT HEART CATHETERIZATION N/A 07/11/2014   Procedure: RIGHT HEART CATH;  Surgeon: Laurey Morale,  MD;  Location: [facility] CATH LAB;  Service: Cardiovascular;  Laterality: N/A;  . ROTATOR CUFF REPAIR    . THYROIDECTOMY  1973  . TONSILLECTOMY  1942  . TUBAL LIGATION      OB History    No data available       Home Medications    Prior to Admission medications   Medication Sig Start Date End Date Taking? Authorizing Provider  acetaminophen (TYLENOL) 500 MG tablet Take 500 mg by mouth every 6 (six) hours as needed for mild pain or headache.    [provider]  butalbital-acetaminophen-caffeine (FIORICET WITH CODEINE) 50-325-40-30 MG per capsule Take 1 capsule by mouth every 4 (four) hours as needed. For migraines    [provider]  Calcium-Magnesium-Vitamin D (CITRACAL CALCIUM+D) 600-40-500 MG-MG-UNIT TB24 Take 1 tablet by mouth daily.     [provider]  chlorhexidine (PERIDEX) 0.12 % solution Use as directed 5 mLs in the mouth or throat daily as needed (gums).  04/13/16   [provider]  Cholecalciferol (VITAMIN D) 2000 UNITS CAPS Take 2,000 Units by mouth daily. Reported on 04/22/2015    [provider]  COUMADIN 5 MG tablet TAKE 1/2 TO 1 TABLET DAILY AS DIRECTED BY COUMADIN CLINIC 05/09/17   Marinus Maw, MD  esomeprazole (NEXIUM) 20 MG capsule Take 20 mg by mouth every 3 (three) days.     [provider]  estradiol (ESTRACE) 1 MG tablet Take 1 mg by mouth at bedtime.     [provider]  furosemide (LASIX) 40 MG tablet Take 1 tablet (40 mg total) by mouth daily. 09/28/16   Quintella Reichert, MD  levothyroxine (SYNTHROID, LEVOTHROID) 125 MCG tablet Take 125 mcg by mouth daily before breakfast. BRAND ONLY    [provider]  LORazepam (ATIVAN) 1 MG tablet Take 0.5-1 mg by mouth at bedtime as needed for sleep. For sleep    [provider]  Magnesium 500 MG CAPS Take 500 mg by mouth daily.    [provider]  nebivolol (BYSTOLIC) 10 MG tablet Take 1 tablet (10 mg total) by mouth daily. Extra 1/2 tab as  needed 07/19/16   Leone Brand, NP  potassium chloride (K-DUR) 10 MEQ tablet Take 10 mEq by mouth daily.    [provider]  potassium chloride (K-DUR) 10 MEQ tablet Take 1 tablet (10 mEq total) daily by mouth. 02/22/17   Quintella Reichert, MD  verapamil (CALAN-SR) 120 MG CR tablet Take 120 mg by mouth daily with lunch.    [provider]    Family History Family History  Problem Relation Age of Onset  . Transient ischemic attack Mother   . Heart failure Father   . CVA Father   . Lung cancer Father   . Hypertension Sister   . Cancer Brother   . Sudden death Brother   . Anesthesia problems Neg Hx   . Hypotension Neg Hx   . Malignant hyperthermia Neg Hx   . Pseudochol deficiency Neg Hx   .  Heart attack Neg Hx     Social History Social History   Tobacco Use  . Smoking status: Never Smoker  . Smokeless tobacco: Never Used  Substance Use Topics  . Alcohol use: Yes    Alcohol/week: 0.0 oz    Comment: rare wine  . Drug use: No     Allergies   Codeine; Penicillins; Tetracycline; Tramadol; Digoxin; Digoxin and related; Erythromycin; Other; Sulfa antibiotics; Tetracyclines & related; Diltiazem; Diltiazem hcl; and Sulfites   Review of Systems Review of Systems  Constitutional: Negative for diaphoresis and fever.  Respiratory: Positive for shortness of breath.   Cardiovascular: Positive for palpitations. Negative for chest pain and syncope.  Gastrointestinal: Negative for vomiting.  Neurological: Negative for syncope.  All other systems reviewed and are negative.    Physical Exam Updated Vital Signs BP (!) 112/93   Pulse (!) 139   Temp (!) 97.5 F (36.4 C) (Axillary)   Resp 18   Ht 1.727 m (5\' 8" )   Wt 84.8 kg (187 lb)   SpO2 97%   BMI 28.43 kg/m   Physical Exam CONSTITUTIONAL: Elderly, appears younger than stated age HEAD: Normocephalic/atraumatic EYES: EOMI ENMT: Mucous membranes moist NECK: supple no meningeal signs SPINE/BACK:entire spine  nontender CV: Tachycardic, no murmurs/rubs/gallops noted LUNGS: Lungs are clear to auscultation bilaterally, no apparent distress ABDOMEN: soft, nontender, no rebound or guarding, bowel sounds noted throughout abdomen NEURO: Pt is awake/alert/appropriate, moves all extremitiesx4.  No facial droop.   EXTREMITIES: pulses normal/equal, full ROM, no lower extremity edema SKIN: warm, color normal PSYCH: no abnormalities of mood noted, alert and oriented to situation   ED Treatments / Results  Labs (all labs ordered are listed, but only abnormal results are displayed) Labs Reviewed  BASIC METABOLIC PANEL - Abnormal; Notable for the following components:      Result Value   CO2 19 (*)    Glucose, Bld 124 (*)    Calcium 8.6 (*)    All other components within normal limits  CBC - Abnormal; Notable for the following components:   Hemoglobin 16.1 (*)    MCH 34.1 (*)    All other components within normal limits  PROTIME-INR  I-STAT TROPONIN, ED    EKG  EKG Interpretation  Date/Time:  Thursday May 25 2017 03:27:05 EST Ventricular Rate:  145 PR Interval:    QRS Duration: 74 QT Interval:  282 QTC Calculation: 438 R Axis:   80 Text Interpretation:  Supraventricular tachycardia Low voltage QRS Cannot rule out Anterior infarct , age undetermined Abnormal ECG Confirmed by Zadie Rhine ([id number]) on 05/25/2017 4:02:14 AM       Radiology Ct Chest Wo Contrast  Result Date: 05/23/2017 CLINICAL DATA:  Followup right upper lobe lung nodule EXAM: CT CHEST WITHOUT CONTRAST TECHNIQUE: Multidetector CT imaging of the chest was performed following the standard protocol without IV contrast. COMPARISON:  Multiple exams, including 03/04/2016 FINDINGS: Cardiovascular: Mild atherosclerotic calcification of the thoracic aorta. Faint calcification along the margins of the left atrium, unchanged. Mediastinum/Nodes: Moderate sized type 3 hiatal hernia. 0.7 cm right upper paratracheal lymph node posteriorly  on image 22/2, previously 0.8 cm. 0.9 cm indistinctly marginated left lower paratracheal lymph node, previously 0.7 cm. No overtly pathologic thoracic adenopathy. Mild prominence of the right thyroid lobe, similar to prior. Lungs/Pleura: Stable biapical pleuroparenchymal scarring. Right upper lobe nodule 1.0 by 0.8 by 0.6 cm by my measurements, previously 0.8 by 0.6 by 0.6 cm on 08/23/2005 and previously 1.0 by 0.9 by 0.6 cm on  05/21/2014. Accordingly the nodule has remained stable over the last 3 years and has only mildly increased over the past 11.5 years, strongly favoring a benign process. 0.6 by 0.4 cm right upper lobe nodule 3 mm nodule along the minor fissure on image 76/3 is likewise thought to be benign. 3 by 4 mm right lower lobe nodule on image 114/3, stable from 05/21/2014 and considered benign. Stable 4 mm right lower lobe on image 80/3. Several other small nodules are felt to be stable and benign. Upper Abdomen: Unremarkable Musculoskeletal: Lower thoracic and upper lumbar spondylosis. Degenerative endplate sclerosis at the T12-L1 level. IMPRESSION: 1. The dominant right upper lobe nodule is stable from 05/21/2014 and only very minimally enlarged from 2007. The appearance is considered benign and in light of the patient's age and lesion stability, I am skeptical whether further follow up would prove of significant benefit. Similarly other small nodules in both lungs are not changed over the last 3 years and considered benign. 2. Moderate-sized type 3 hiatal hernia. 3.  Aortic Atherosclerosis (ICD10-I70.0). 4. Lower thoracic and upper lumbar spondylosis. Electronically Signed   By: Gaylyn Rong M.D.   On: 05/23/2017 14:15   Dg Chest Portable 1 View  Result Date: 05/25/2017 CLINICAL DATA:  Acute onset of supraventricular tachycardia. EXAM: PORTABLE CHEST 1 VIEW COMPARISON:  CT of the chest performed 05/23/2017 FINDINGS: The lungs are well-aerated and clear. There is no evidence of focal  opacification, pleural effusion or pneumothorax. The cardiomediastinal silhouette is within normal limits. No acute osseous abnormalities are seen. A moderate hiatal hernia is again noted. IMPRESSION: 1. No acute cardiopulmonary process seen. 2. Moderate hiatal hernia again noted. Electronically Signed   By: Roanna Raider M.D.   On: 05/25/2017 04:52    Procedures Procedures (including critical care time)  Medications Ordered in ED Medications - No data to display   Initial Impression / Assessment and Plan / ED Course  I have reviewed the triage vital signs and the nursing notes.  Pertinent labs & imaging results that were available during my care of the patient were reviewed by me and considered in my medical decision making (see chart for details).     5:05 AM Patient history of atrial fibrillation, has had multiple cardioversions and ablations in the past.  She is maintained on Coumadin.  She has multiple allergies to cardiac meds.  EKG reveals SVT, but concerned this is atrial flutter Patient currently stable, no acute distress.  Due to her complex history will consult cardiology 5:34 AM Discussed with Dr. Jearld Pies with cardiology.  This patient would likely benefit from cardioversion as if she is in a-flutter, but she has recently been subtherapeutic on her Coumadin.  Due to this she will likely need to be admitted.  He will have the morning cardiology team come to admit the patient Final Clinical Impressions(s) / ED Diagnoses   Final diagnoses:  Atrial flutter with rapid ventricular response [facility])    ED Discharge Orders    None       Zadie Rhine, MD 05/25/17 1308    Zadie Rhine, MD 05/25/17 [phone]

## 2017-05-25 NOTE — ED Notes (Addendum)
Came to get patient for room, delay in bringing patient to room d/t blood draw. Charge aware.

## 2017-05-25 NOTE — ED Provider Notes (Signed)
BP 108/72   Pulse 66   Temp (!) 97.5 F (36.4 C) (Axillary)   Resp 14   Ht 1.727 m (5\' 8" )   Wt 84.8 kg (187 lb)   SpO2 98%   BMI 28.43 kg/m    EKG Interpretation  Date/Time:  Thursday May 25 2017 06:39:11 EST Ventricular Rate:  70 PR Interval:    QRS Duration: 78 QT Interval:  378 QTC Calculation: 408 R Axis:   74 Text Interpretation:  Sinus rhythm Prolonged PR interval Anterior infarct, old improved from prior Confirmed by Zadie Rhine ([id number]) on 05/25/2017 6:55:20 AM      Patient is now back in sinus rhythm.  She would like to be discharged.  This seems reasonable.  Discussed with her that INR is still low. She will take an extra dose of Coumadin today.  Advise normal dosing tomorrow.  Follow-up next week with Coumadin clinic recheck to ensure she is therapeutic Pt has no other complaints    Zadie Rhine, MD 05/25/17 [phone]

## 2017-05-25 NOTE — ED Notes (Signed)
Natasha [name] confirmed with lab they did receive PT-INR, lab to run now.

## 2017-05-25 NOTE — ED Notes (Signed)
Pt heart rate dropped from 140's to 70. Bebe Shaggy, MD informed. EKG shot. Will continue to monitor.

## 2017-05-25 NOTE — ED Triage Notes (Signed)
Pt reports sudden onset palpitations tonight, came to ED d/t hx afib and concerned because she couldn't feel her pulse. SVT noted in triage, rate 145

## 2017-05-29 ENCOUNTER — Ambulatory Visit (INDEPENDENT_AMBULATORY_CARE_PROVIDER_SITE_OTHER): Payer: Medicare Other | Admitting: Pharmacist Clinician (PhC)/ Clinical Pharmacy Specialist

## 2017-05-29 DIAGNOSIS — I481 Persistent atrial fibrillation: Secondary | ICD-10-CM

## 2017-05-29 DIAGNOSIS — Z7901 Long term (current) use of anticoagulants: Secondary | ICD-10-CM

## 2017-05-29 DIAGNOSIS — I4819 Other persistent atrial fibrillation: Secondary | ICD-10-CM

## 2017-05-29 LAB — POCT INR: INR: 2.8

## 2017-05-31 ENCOUNTER — Ambulatory Visit (HOSPITAL_COMMUNITY)
Admission: RE | Admit: 2017-05-31 | Discharge: 2017-05-31 | Disposition: A | Discharge status: Home / Self Care | Payer: Medicare Other | Source: Ambulatory Visit | Attending: Nurse Practitioner | Admitting: Nurse Practitioner

## 2017-05-31 ENCOUNTER — Encounter (HOSPITAL_COMMUNITY): Payer: Self-pay | Admitting: Nurse Practitioner

## 2017-05-31 VITALS — BP 128/76 | HR 68 | Ht 68.0 in | Wt 199.0 lb

## 2017-05-31 DIAGNOSIS — M1990 Unspecified osteoarthritis, unspecified site: Secondary | ICD-10-CM | POA: Insufficient documentation

## 2017-05-31 DIAGNOSIS — G2581 Restless legs syndrome: Secondary | ICD-10-CM | POA: Insufficient documentation

## 2017-05-31 DIAGNOSIS — I484 Atypical atrial flutter: Secondary | ICD-10-CM | POA: Insufficient documentation

## 2017-05-31 DIAGNOSIS — Z79899 Other long term (current) drug therapy: Secondary | ICD-10-CM | POA: Insufficient documentation

## 2017-05-31 DIAGNOSIS — E039 Hypothyroidism, unspecified: Secondary | ICD-10-CM | POA: Insufficient documentation

## 2017-05-31 DIAGNOSIS — K449 Diaphragmatic hernia without obstruction or gangrene: Secondary | ICD-10-CM | POA: Insufficient documentation

## 2017-05-31 DIAGNOSIS — Z7901 Long term (current) use of anticoagulants: Secondary | ICD-10-CM | POA: Diagnosis not present

## 2017-05-31 DIAGNOSIS — I480 Paroxysmal atrial fibrillation: Secondary | ICD-10-CM

## 2017-05-31 DIAGNOSIS — K219 Gastro-esophageal reflux disease without esophagitis: Secondary | ICD-10-CM | POA: Diagnosis not present

## 2017-05-31 DIAGNOSIS — I481 Persistent atrial fibrillation: Secondary | ICD-10-CM | POA: Diagnosis not present

## 2017-05-31 DIAGNOSIS — I110 Hypertensive heart disease with heart failure: Secondary | ICD-10-CM | POA: Diagnosis not present

## 2017-05-31 DIAGNOSIS — I440 Atrioventricular block, first degree: Secondary | ICD-10-CM | POA: Diagnosis not present

## 2017-05-31 DIAGNOSIS — I5032 Chronic diastolic (congestive) heart failure: Secondary | ICD-10-CM | POA: Diagnosis not present

## 2017-05-31 NOTE — Progress Notes (Signed)
Patient ID: Natasha [name], female   DOB: 1932-06-28, 84 y.o.   MRN: [id number]     Primary Care Physician: Marden Noble, MD Referring Physician: [facility] street office triage Cardiologist: Dr. Mayford Knife EP: Dr. Driscilla Moats is a 84 y.o. female with a very complex afib history, h/o  persistent afib with ablation x2 at [facility] by Dr. Dineen Kid. She was in the ER recently with atypical atrial flutter but she self converted. She took a dose of verapamil  before leaving for the  ER. Her issues with rhythm disturbance have been very quiet recently.  Pt  has failed flecainide, multaq and tikosyn. She states that she is not a candidate for amiodarone.  Her BP is soft frequently so not a lot of room to increase rate control meds. She was on digoxin at one time years ago. Dr. Ladona Ridgel suggested another option may be AV node ablation/PPM if burden becomes increased.In the afib clinic, she reports no other issues. She continues on coumadin.  Today, she denies symptoms of palpitations, chest pain, shortness of breath, orthopnea, PND, lower extremity edema, dizziness, presyncope, syncope, or neurologic sequela. The patient is tolerating medications without difficulties and is otherwise without complaint today.   Past Medical History:  Diagnosis Date  . Chronic diastolic heart failure (HCC) 04/22/2015  . Complication of anesthesia   . Diverticular disease   . DJD (degenerative joint disease)   . Dysphagia   . GERD (gastroesophageal reflux disease)   . Hiatal hernia   . Hypertension   . Hypothyroidism   . Lower leg edema   . PAF (paroxysmal atrial fibrillation) (HCC)    s/p ablation x several times now with occasional episodes of PAF.   She has failed sotolol/dofetilide/flecainide/propafenone/Amio and dronedarone either with not controlling her PAF or having side effects  . PONV (postoperative nausea and vomiting)   . Restless leg syndrome   . Seasonal allergies    Past Surgical History:    Procedure Laterality Date  . ABDOMINAL HYSTERECTOMY    . APPENDECTOMY    . ATRIAL FIBRILLATION ABLATION  09-06-13   2'14 -[facility]  . BALLOON DILATION  03/03/2011   Procedure: BALLOON DILATION;  Surgeon: Charolett Bumpers, MD;  Location: [facility] ENDOSCOPY;  Service: Endoscopy;  Laterality: N/A;  . bladder polyps    . BREAST SURGERY     left lumpectomy  . CARDIOVERSION     x3  . CARDIOVERSION N/A 12/30/2014   Procedure: CARDIOVERSION;  Surgeon: Lewayne Bunting, MD;  Location: [facility] ENDOSCOPY;  Service: Cardiovascular;  Laterality: N/A;  . CARDIOVERSION N/A 04/23/2015   Procedure: CARDIOVERSION;  Surgeon: Thurmon Fair, MD;  Location: [facility] ENDOSCOPY;  Service: Cardiovascular;  Laterality: N/A;  . CATARACT EXTRACTION Left   . COLONOSCOPY WITH PROPOFOL N/A 09/24/2013   Procedure: COLONOSCOPY WITH PROPOFOL;  Surgeon: Charolett Bumpers, MD;  Location: [facility] ENDOSCOPY;  Service: Endoscopy;  Laterality: N/A;  . ESOPHAGOGASTRODUODENOSCOPY  03/03/2011   Procedure: ESOPHAGOGASTRODUODENOSCOPY (EGD);  Surgeon: Charolett Bumpers, MD;  Location: [facility] ENDOSCOPY;  Service: Endoscopy;  Laterality: N/A;  . EYE SURGERY     cataracat  . RHINOPLASTY    . RIGHT HEART CATHETERIZATION N/A 07/11/2014   Procedure: RIGHT HEART CATH;  Surgeon: Laurey Morale, MD;  Location: [facility] CATH LAB;  Service: Cardiovascular;  Laterality: N/A;  . ROTATOR CUFF REPAIR    . THYROIDECTOMY  1973  . TONSILLECTOMY  1942  . TUBAL LIGATION      Current  Outpatient Medications  Medication Sig Dispense Refill  . Cholecalciferol (VITAMIN D) 2000 UNITS CAPS Take 2,000 Units by mouth daily. Reported on 04/22/2015    . COUMADIN 5 MG tablet TAKE 1/2 TO 1 TABLET DAILY AS DIRECTED BY COUMADIN CLINIC (Patient taking differently: Take 2.5-5 mg by mouth See admin instructions. Take 1 tablet on Monday, Tuesday and Thursday then take 1/2 tablet all the other days) 90 tablet 0  . esomeprazole (NEXIUM) 20 MG capsule Take 20 mg by mouth every 3 (three) days.     Marland Kitchen  estradiol (ESTRACE) 1 MG tablet Take 1 mg by mouth at bedtime.     . furosemide (LASIX) 40 MG tablet Take 1 tablet (40 mg total) by mouth daily. 90 tablet 3  . levothyroxine (SYNTHROID, LEVOTHROID) 125 MCG tablet Take 125 mcg by mouth daily before breakfast. BRAND ONLY    . LORazepam (ATIVAN) 1 MG tablet Take 0.5-1 mg by mouth at bedtime as needed for sleep. For sleep    . Magnesium 500 MG CAPS Take 500 mg by mouth daily.    . nebivolol (BYSTOLIC) 10 MG tablet Take 1 tablet (10 mg total) by mouth daily. Extra 1/2 tab as needed (Patient taking differently: Take 5 mg by mouth 2 (two) times daily. Extra 1/2 tab as needed) 90 tablet 3  . potassium chloride (K-DUR) 10 MEQ tablet Take 1 tablet (10 mEq total) daily by mouth. 30 tablet 8  . Probiotic Product (FLORAJEN3 PO) Take by mouth.    . verapamil (CALAN-SR) 120 MG CR tablet Take 120 mg by mouth daily as needed (for rapid heart rate).      No current facility-administered medications for this encounter.     Allergies  Allergen Reactions  . Codeine Nausea And Vomiting and Nausea Only  . Penicillins Hives    Has patient had a PCN reaction causing immediate rash, facial/tongue/throat swelling, SOB or lightheadedness with hypotension: No Has patient had a PCN reaction causing severe rash involving mucus membranes or skin necrosis: No Has patient had a PCN reaction that required hospitalization No Has patient had a PCN reaction occurring within the last 10 years: No If all of the above answers are "NO", then may proceed with Cephalosporin use.  . Tetracycline Rash  . Tramadol Nausea Only  . Digoxin Other (See Comments)    Flushed neck and face  . Digoxin And Related Other (See Comments)    ' Makes my face blood red"  . Erythromycin Other (See Comments) and Hives    Flushing on face  . Other Other (See Comments)    ECG leads cause skin irritation. ECG leads cause skin irritation.  . Sulfa Antibiotics Other (See Comments)    PRESERVATIVE OF  SOME FOODS- MAKES FLUSHES TO FACE AND NECK  . Tetracyclines & Related Other (See Comments)    "Sores on legs"  . Diltiazem Rash and Other (See Comments)    Rash with long acting form  . Diltiazem Hcl Rash    Rash with long acting form  . Sulfites Rash    PRESERVATIVE OF SOME FOODS- MAKES FLUSHES TO FACE AND NECK PRESERVATIVE OF SOME FOODS- MAKES FLUSHES TO FACE AND NECK    Social History   Socioeconomic History  . Marital status: Married    Spouse name: Not on file  . Number of children: Not on file  . Years of education: Not on file  . Highest education level: Not on file  Social Needs  . Financial resource strain:  Not on file  . Food insecurity - worry: Not on file  . Food insecurity - inability: Not on file  . Transportation needs - medical: Not on file  . Transportation needs - non-medical: Not on file  Occupational History  . Occupation: retired  Tobacco Use  . Smoking status: Never Smoker  . Smokeless tobacco: Never Used  Substance and Sexual Activity  . Alcohol use: Yes    Alcohol/week: 0.0 oz    Comment: rare wine  . Drug use: No  . Sexual activity: No    Birth control/protection: Post-menopausal  Other Topics Concern  . Not on file  Social History Narrative  . Not on file    Family History  Problem Relation Age of Onset  . Transient ischemic attack Mother   . Heart failure Father   . CVA Father   . Lung cancer Father   . Hypertension Sister   . Cancer Brother   . Sudden death Brother   . Anesthesia problems Neg Hx   . Hypotension Neg Hx   . Malignant hyperthermia Neg Hx   . Pseudochol deficiency Neg Hx   . Heart attack Neg Hx     ROS- All systems are reviewed and negative except as per the HPI above  Physical Exam: Vitals:   05/31/17 1413  BP: 128/76  Pulse: 68  Weight: 199 lb (90.3 kg)  Height: 5\' 8"  (1.727 m)    GEN- The patient is well appearing, alert and oriented x 3 today.   Head- normocephalic, atraumatic Eyes-  Sclera clear,  conjunctiva pink Ears- hearing intact Oropharynx- clear Neck- supple, no JVP Lymph- no cervical lymphadenopathy Lungs- Clear to ausculation bilaterally, normal work of breathing Heart- regular rate and rhythm, no murmurs, rubs or gallops, PMI not laterally displaced GI- soft, NT, ND, + BS Extremities- no clubbing, cyanosis, or edema MS- no significant deformity or atrophy Skin- no rash or lesion Psych- euthymic mood, full affect Neuro- strength and sensation are intact  EKG-  Sinus rhythm at 68 bpm, PR int 236 ms, qrs int 58 ms, qtc 406 ms Epic records reviewed  Assessment and Plan: 1. H/o persistent symptomatic afib/flutter/atrial tach with a complicated history and failure of antiarrythmic's/cardioversion's In SR today after a recent ER visit of self converted atypical atrial flutter Very complicated pt, cannot increase rate control meds because of frequent soft BP She does have 30 mg cardizem if needed, if she has a systolic BP over 829 mg Was seen by Dr. Ladona Ridgel in the recent past and was offered  AV nodal ablation with PPM She feels that everything has been going well except for this recent non sustained episodes which seemed to respond well to a dose of verapmil Continue coumadin for a chadsvasc score of at least 5   F/u with Dr. Ladona Ridgel as scheduled  4/5    Lupita Leash C. [facility] [address] [place], [place] [address] [phone]

## 2017-06-06 DIAGNOSIS — M2550 Pain in unspecified joint: Secondary | ICD-10-CM | POA: Diagnosis not present

## 2017-06-06 DIAGNOSIS — Z0000 Encounter for general adult medical examination without abnormal findings: Secondary | ICD-10-CM | POA: Diagnosis not present

## 2017-06-06 DIAGNOSIS — E559 Vitamin D deficiency, unspecified: Secondary | ICD-10-CM | POA: Diagnosis not present

## 2017-06-06 DIAGNOSIS — E039 Hypothyroidism, unspecified: Secondary | ICD-10-CM | POA: Diagnosis not present

## 2017-06-06 DIAGNOSIS — I119 Hypertensive heart disease without heart failure: Secondary | ICD-10-CM | POA: Diagnosis not present

## 2017-06-06 DIAGNOSIS — I10 Essential (primary) hypertension: Secondary | ICD-10-CM | POA: Diagnosis not present

## 2017-06-06 DIAGNOSIS — R1011 Right upper quadrant pain: Secondary | ICD-10-CM | POA: Diagnosis not present

## 2017-06-06 DIAGNOSIS — I4891 Unspecified atrial fibrillation: Secondary | ICD-10-CM | POA: Diagnosis not present

## 2017-06-06 DIAGNOSIS — Z79899 Other long term (current) drug therapy: Secondary | ICD-10-CM | POA: Diagnosis not present

## 2017-06-06 DIAGNOSIS — E669 Obesity, unspecified: Secondary | ICD-10-CM | POA: Diagnosis not present

## 2017-06-06 DIAGNOSIS — M25569 Pain in unspecified knee: Secondary | ICD-10-CM | POA: Diagnosis not present

## 2017-06-06 DIAGNOSIS — Z1389 Encounter for screening for other disorder: Secondary | ICD-10-CM | POA: Diagnosis not present

## 2017-06-06 DIAGNOSIS — R918 Other nonspecific abnormal finding of lung field: Secondary | ICD-10-CM | POA: Diagnosis not present

## 2017-06-14 ENCOUNTER — Ambulatory Visit
Admission: RE | Admit: 2017-06-14 | Discharge: 2017-06-14 | Disposition: A | Discharge status: Home / Self Care | Payer: Medicare Other | Source: Ambulatory Visit | Attending: Internal Medicine | Admitting: Internal Medicine

## 2017-06-14 DIAGNOSIS — Z139 Encounter for screening, unspecified: Secondary | ICD-10-CM

## 2017-06-14 DIAGNOSIS — Z1231 Encounter for screening mammogram for malignant neoplasm of breast: Secondary | ICD-10-CM | POA: Diagnosis not present

## 2017-06-15 DIAGNOSIS — I4891 Unspecified atrial fibrillation: Secondary | ICD-10-CM | POA: Diagnosis not present

## 2017-06-15 DIAGNOSIS — M170 Bilateral primary osteoarthritis of knee: Secondary | ICD-10-CM | POA: Diagnosis not present

## 2017-06-15 DIAGNOSIS — E890 Postprocedural hypothyroidism: Secondary | ICD-10-CM | POA: Diagnosis not present

## 2017-06-15 DIAGNOSIS — I5032 Chronic diastolic (congestive) heart failure: Secondary | ICD-10-CM | POA: Diagnosis not present

## 2017-06-15 DIAGNOSIS — E038 Other specified hypothyroidism: Secondary | ICD-10-CM | POA: Diagnosis not present

## 2017-06-15 DIAGNOSIS — I10 Essential (primary) hypertension: Secondary | ICD-10-CM | POA: Diagnosis not present

## 2017-06-15 DIAGNOSIS — I119 Hypertensive heart disease without heart failure: Secondary | ICD-10-CM | POA: Diagnosis not present

## 2017-06-15 DIAGNOSIS — E039 Hypothyroidism, unspecified: Secondary | ICD-10-CM | POA: Diagnosis not present

## 2017-06-21 ENCOUNTER — Ambulatory Visit (INDEPENDENT_AMBULATORY_CARE_PROVIDER_SITE_OTHER): Payer: Medicare Other | Admitting: Pharmacist

## 2017-06-21 DIAGNOSIS — M1711 Unilateral primary osteoarthritis, right knee: Secondary | ICD-10-CM | POA: Diagnosis not present

## 2017-06-21 DIAGNOSIS — Z7901 Long term (current) use of anticoagulants: Secondary | ICD-10-CM

## 2017-06-21 DIAGNOSIS — I481 Persistent atrial fibrillation: Secondary | ICD-10-CM | POA: Diagnosis not present

## 2017-06-21 DIAGNOSIS — M1712 Unilateral primary osteoarthritis, left knee: Secondary | ICD-10-CM | POA: Diagnosis not present

## 2017-06-21 DIAGNOSIS — I4819 Other persistent atrial fibrillation: Secondary | ICD-10-CM

## 2017-06-21 MED ORDER — APIXABAN 5 MG PO TABS: 5.0000 mg | ORAL_TABLET | Freq: Two times a day (BID) | ORAL | 2 refills | Status: DC

## 2017-06-28 DIAGNOSIS — M1712 Unilateral primary osteoarthritis, left knee: Secondary | ICD-10-CM | POA: Diagnosis not present

## 2017-06-28 DIAGNOSIS — M170 Bilateral primary osteoarthritis of knee: Secondary | ICD-10-CM | POA: Diagnosis not present

## 2017-06-28 DIAGNOSIS — M1711 Unilateral primary osteoarthritis, right knee: Secondary | ICD-10-CM | POA: Diagnosis not present

## 2017-07-04 DIAGNOSIS — E039 Hypothyroidism, unspecified: Secondary | ICD-10-CM | POA: Diagnosis not present

## 2017-07-04 DIAGNOSIS — M170 Bilateral primary osteoarthritis of knee: Secondary | ICD-10-CM | POA: Diagnosis not present

## 2017-07-04 DIAGNOSIS — I119 Hypertensive heart disease without heart failure: Secondary | ICD-10-CM | POA: Diagnosis not present

## 2017-07-04 DIAGNOSIS — I4891 Unspecified atrial fibrillation: Secondary | ICD-10-CM | POA: Diagnosis not present

## 2017-07-04 DIAGNOSIS — E038 Other specified hypothyroidism: Secondary | ICD-10-CM | POA: Diagnosis not present

## 2017-07-04 DIAGNOSIS — I10 Essential (primary) hypertension: Secondary | ICD-10-CM | POA: Diagnosis not present

## 2017-07-04 DIAGNOSIS — E890 Postprocedural hypothyroidism: Secondary | ICD-10-CM | POA: Diagnosis not present

## 2017-07-04 DIAGNOSIS — I5032 Chronic diastolic (congestive) heart failure: Secondary | ICD-10-CM | POA: Diagnosis not present

## 2017-07-05 DIAGNOSIS — M25561 Pain in right knee: Secondary | ICD-10-CM | POA: Insufficient documentation

## 2017-07-05 DIAGNOSIS — M25562 Pain in left knee: Secondary | ICD-10-CM | POA: Insufficient documentation

## 2017-07-05 DIAGNOSIS — M170 Bilateral primary osteoarthritis of knee: Secondary | ICD-10-CM | POA: Diagnosis not present

## 2017-07-12 ENCOUNTER — Other Ambulatory Visit: Payer: Self-pay | Admitting: Cardiology

## 2017-07-12 DIAGNOSIS — M1712 Unilateral primary osteoarthritis, left knee: Secondary | ICD-10-CM | POA: Diagnosis not present

## 2017-07-12 DIAGNOSIS — M7061 Trochanteric bursitis, right hip: Secondary | ICD-10-CM | POA: Diagnosis not present

## 2017-07-13 ENCOUNTER — Other Ambulatory Visit (HOSPITAL_COMMUNITY): Payer: Self-pay | Admitting: *Deleted

## 2017-07-13 ENCOUNTER — Telehealth (HOSPITAL_COMMUNITY): Payer: Self-pay | Admitting: *Deleted

## 2017-07-13 ENCOUNTER — Other Ambulatory Visit: Payer: Self-pay | Admitting: Cardiology

## 2017-07-13 MED ORDER — NEBIVOLOL HCL 10 MG PO TABS: 10.0000 mg | ORAL_TABLET | Freq: Every day | ORAL | 1 refills | Status: DC

## 2017-07-13 MED ORDER — NEBIVOLOL HCL 5 MG PO TABS: ORAL_TABLET | ORAL | 3 refills | Status: DC

## 2017-07-13 NOTE — Telephone Encounter (Signed)
Quantity limits for bystolic approved from 04/14/17-07/13/18.

## 2017-07-21 ENCOUNTER — Ambulatory Visit: Payer: Medicare Other | Admitting: Internal Medicine

## 2017-07-28 ENCOUNTER — Telehealth: Payer: Self-pay | Admitting: Internal Medicine

## 2017-07-28 NOTE — Telephone Encounter (Signed)
  CT chestfeb 2019  stable since 2016. No further CT followup indicated. She does have her hiatal hernia unchanged. She can followup as needed or return to discuss any concerns she might have. Apoliogies for delay   Dr. Kalman Shan, M.D., [facility].C.P Pulmonary and Critical Care Medicine Staff Physician, [facility] Director - Interstitial Lung Disease  Program  Pulmonary Fibrosis [facility] at [facility] [place], [place], [address]  Pager: [phone], If no answer or between  15:00h - 7:00h: call [phone] Telephone: [phone]     IMPRESSION: 1. The dominant right upper lobe nodule is stable from 05/21/2014 and only very minimally enlarged from 2007. The appearance is considered benign and in light of the patient's age and lesion stability, I am skeptical whether further follow up would prove of significant benefit. Similarly other small nodules in both lungs are not changed over the last 3 years and considered benign. 2. Moderate-sized type 3 hiatal hernia. 3.  Aortic Atherosclerosis (ICD10-I70.0). 4. Lower thoracic and upper lumbar spondylosis.   Electronically Signed   By: Gaylyn Rong M.D.   On: 05/23/2017 14:15

## 2017-07-31 DIAGNOSIS — I10 Essential (primary) hypertension: Secondary | ICD-10-CM | POA: Diagnosis not present

## 2017-07-31 DIAGNOSIS — M170 Bilateral primary osteoarthritis of knee: Secondary | ICD-10-CM | POA: Diagnosis not present

## 2017-07-31 DIAGNOSIS — E038 Other specified hypothyroidism: Secondary | ICD-10-CM | POA: Diagnosis not present

## 2017-07-31 DIAGNOSIS — I5032 Chronic diastolic (congestive) heart failure: Secondary | ICD-10-CM | POA: Diagnosis not present

## 2017-07-31 DIAGNOSIS — I4891 Unspecified atrial fibrillation: Secondary | ICD-10-CM | POA: Diagnosis not present

## 2017-07-31 NOTE — Telephone Encounter (Signed)
Called and spoke with pt letting her know the results of her ct scan.  Pt expressed understanding. Nothing further needed at this time.

## 2017-09-21 ENCOUNTER — Other Ambulatory Visit: Payer: Self-pay | Admitting: Cardiology

## 2017-09-28 ENCOUNTER — Ambulatory Visit (INDEPENDENT_AMBULATORY_CARE_PROVIDER_SITE_OTHER): Payer: Medicare Other | Admitting: Internal Medicine

## 2017-09-28 ENCOUNTER — Other Ambulatory Visit: Payer: Self-pay | Admitting: Cardiology

## 2017-09-28 ENCOUNTER — Encounter: Payer: Self-pay | Admitting: Internal Medicine

## 2017-09-28 VITALS — BP 120/78 | HR 69 | Ht 68.0 in | Wt 204.0 lb

## 2017-09-28 DIAGNOSIS — I481 Persistent atrial fibrillation: Secondary | ICD-10-CM | POA: Diagnosis not present

## 2017-09-28 DIAGNOSIS — I5032 Chronic diastolic (congestive) heart failure: Secondary | ICD-10-CM

## 2017-09-28 DIAGNOSIS — I4819 Other persistent atrial fibrillation: Secondary | ICD-10-CM

## 2017-09-28 NOTE — Patient Instructions (Addendum)

## 2017-09-28 NOTE — Progress Notes (Signed)
HPI Natasha [name] returns today for followup of her paroxysmal atrial fib and flutter. She is a pleasant elderly woman with the above problems, multiple ablations who as last in the ER about 4 months ago with PAF. She reverted on her own. She has been placed on Eliquis. She has noted some fatigue. Evaluation less that 2 years ago demonstrated normal perfusion and EF. She denies evidence of bleeding and her thyroid function has been normal. No syncope No edema. Allergies  Allergen Reactions  . Codeine Nausea And Vomiting and Nausea Only  . Penicillins Hives    Has patient had a PCN reaction causing immediate rash, facial/tongue/throat swelling, SOB or lightheadedness with hypotension: No Has patient had a PCN reaction causing severe rash involving mucus membranes or skin necrosis: No Has patient had a PCN reaction that required hospitalization No Has patient had a PCN reaction occurring within the last 10 years: No If all of the above answers are "NO", then may proceed with Cephalosporin use.  . Tetracycline Rash  . Tramadol Nausea Only  . Digoxin Other (See Comments)    Flushed neck and face  . Digoxin And Related Other (See Comments)    ' Makes my face blood red"  . Erythromycin Other (See Comments) and Hives    Flushing on face  . Other Other (See Comments)    ECG leads cause skin irritation. ECG leads cause skin irritation.  . Sulfa Antibiotics Other (See Comments)    PRESERVATIVE OF SOME FOODS- MAKES FLUSHES TO FACE AND NECK  . Tetracyclines & Related Other (See Comments)    "Sores on legs"  . Diltiazem Rash and Other (See Comments)    Rash with long acting form  . Diltiazem Hcl Rash    Rash with long acting form  . Sulfites Rash    PRESERVATIVE OF SOME FOODS- MAKES FLUSHES TO FACE AND NECK PRESERVATIVE OF SOME FOODS- MAKES FLUSHES TO FACE AND NECK     Current Outpatient Medications  Medication Sig Dispense Refill  . Cholecalciferol (VITAMIN D) 2000 UNITS CAPS Take  2,000 Units by mouth daily. Reported on 04/22/2015    . ELIQUIS 5 MG TABS tablet TAKE 1 TABLET BY MOUTH 2 TIMES DAILY. 60 tablet 1  . esomeprazole (NEXIUM) 20 MG capsule Take 20 mg by mouth every 3 (three) days.     Marland Kitchen estradiol (ESTRACE) 1 MG tablet Take 1 mg by mouth at bedtime.     . furosemide (LASIX) 40 MG tablet TAKE 1 TABLET BY MOUTH DAILY. 90 tablet 0  . levothyroxine (SYNTHROID, LEVOTHROID) 125 MCG tablet Take 125 mcg by mouth daily before breakfast. BRAND ONLY    . LORazepam (ATIVAN) 1 MG tablet Take 0.5-1 mg by mouth at bedtime as needed for sleep. For sleep    . Magnesium 500 MG CAPS Take 500 mg by mouth daily.    . nebivolol (BYSTOLIC) 5 MG tablet Take  1 tablet in the morning and 1/2 tablet in the evening. Extra 1/2 tab as needed for breakthrough afib 50 tablet 3  . potassium chloride (K-DUR) 10 MEQ tablet Take 1 tablet (10 mEq total) daily by mouth. 30 tablet 8  . Probiotic Product (FLORAJEN3 PO) Take by mouth.    . verapamil (CALAN-SR) 120 MG CR tablet Take 120 mg by mouth daily as needed (for rapid heart rate).      No current facility-administered medications for this visit.      Past Medical History:  Diagnosis Date  .  Chronic diastolic heart failure (HCC) 04/22/2015  . Complication of anesthesia   . Diverticular disease   . DJD (degenerative joint disease)   . Dysphagia   . GERD (gastroesophageal reflux disease)   . Hiatal hernia   . Hypertension   . Hypothyroidism   . Lower leg edema   . PAF (paroxysmal atrial fibrillation) (HCC)    s/p ablation x several times now with occasional episodes of PAF.   She has failed sotolol/dofetilide/flecainide/propafenone/Amio and dronedarone either with not controlling her PAF or having side effects  . PONV (postoperative nausea and vomiting)   . Restless leg syndrome   . Seasonal allergies     ROS:   All systems reviewed and negative except as noted in the HPI.   Past Surgical History:  Procedure Laterality Date  .  ABDOMINAL HYSTERECTOMY    . APPENDECTOMY    . ATRIAL FIBRILLATION ABLATION  09-06-13   2'14 -[facility]  . BALLOON DILATION  03/03/2011   Procedure: BALLOON DILATION;  Surgeon: Charolett Bumpers, MD;  Location: [facility];  Service: Endoscopy;  Laterality: N/A;  . bladder polyps    . BREAST EXCISIONAL BIOPSY Left   . BREAST SURGERY     left lumpectomy  . CARDIOVERSION     x3  . CARDIOVERSION N/A 12/30/2014   Procedure: CARDIOVERSION;  Surgeon: Lewayne Bunting, MD;  Location: [facility] ENDOSCOPY;  Service: Cardiovascular;  Laterality: N/A;  . CARDIOVERSION N/A 04/23/2015   Procedure: CARDIOVERSION;  Surgeon: Thurmon Fair, MD;  Location: [facility] ENDOSCOPY;  Service: Cardiovascular;  Laterality: N/A;  . CATARACT EXTRACTION Left   . COLONOSCOPY WITH PROPOFOL N/A 09/24/2013   Procedure: COLONOSCOPY WITH PROPOFOL;  Surgeon: Charolett Bumpers, MD;  Location: [facility] ENDOSCOPY;  Service: Endoscopy;  Laterality: N/A;  . ESOPHAGOGASTRODUODENOSCOPY  03/03/2011   Procedure: ESOPHAGOGASTRODUODENOSCOPY (EGD);  Surgeon: Charolett Bumpers, MD;  Location: [facility] ENDOSCOPY;  Service: Endoscopy;  Laterality: N/A;  . EYE SURGERY     cataracat  . RHINOPLASTY    . RIGHT HEART CATHETERIZATION N/A 07/11/2014   Procedure: RIGHT HEART CATH;  Surgeon: Laurey Morale, MD;  Location: [facility] CATH LAB;  Service: Cardiovascular;  Laterality: N/A;  . ROTATOR CUFF REPAIR    . THYROIDECTOMY  1973  . TONSILLECTOMY  1942  . TUBAL LIGATION       Family History  Problem Relation Age of Onset  . Transient ischemic attack Mother   . Heart failure Father   . CVA Father   . Lung cancer Father   . Hypertension Sister   . Cancer Brother   . Sudden death Brother   . Anesthesia problems Neg Hx   . Hypotension Neg Hx   . Malignant hyperthermia Neg Hx   . Pseudochol deficiency Neg Hx   . Heart attack Neg Hx      Social History   Socioeconomic History  . Marital status: Married    Spouse name: Not on file  . Number of children: Not on  file  . Years of education: Not on file  . Highest education level: Not on file  Occupational History  . Occupation: retired  Engineer, production  . Financial resource strain: Not on file  . Food insecurity:    Worry: Not on file    Inability: Not on file  . Transportation needs:    Medical: Not on file    Non-medical: Not on file  Tobacco Use  . Smoking status: Never Smoker  . Smokeless tobacco: Never Used  Substance and Sexual Activity  . Alcohol use: Yes    Alcohol/week: 0.0 oz    Comment: rare wine  . Drug use: No  . Sexual activity: Never    Birth control/protection: Post-menopausal  Lifestyle  . Physical activity:    Days per week: Not on file    Minutes per session: Not on file  . Stress: Not on file  Relationships  . Social connections:    Talks on phone: Not on file    Gets together: Not on file    Attends religious service: Not on file    Active member of club or organization: Not on file    Attends meetings of clubs or organizations: Not on file    Relationship status: Not on file  . Intimate partner violence:    Fear of current or ex partner: Not on file    Emotionally abused: Not on file    Physically abused: Not on file    Forced sexual activity: Not on file  Other Topics Concern  . Not on file  Social History Narrative  . Not on file     BP 120/78   Pulse 69   Ht 5\' 8"  (1.727 m)   Wt 204 lb (92.5 kg)   SpO2 96%   BMI 31.02 kg/m   Physical Exam:  Well appearing 84 yo woman, NAD HEENT: Unremarkable Neck:  6 cm JVD, no thyromegally Lymphatics:  No adenopathy Back:  No CVA tenderness Lungs:  Clear with no wheezes HEART:  Regular rate rhythm, no murmurs, no rubs, no clicks Abd:  soft, positive bowel sounds, no organomegally, no rebound, no guarding Ext:  2 plus pulses, no edema, no cyanosis, no clubbing Skin:  No rashes no nodules Neuro:  CN II through XII intact, motor grossly intact  EKG - nsr   Assess/Plan: 1. PAF - she is maintaining  NSR. She will continue her current meds. 2. Coags - she has had no bleeding on eliquis. 3. Chronic diastolic heart failure - her PA pressure by last echo was up slightly. She is encouraged to increase her activity and maintain a low sodium diet. I spent 25 minutes including over 50% face to face time with this patient.  Leonia Reeves.D

## 2017-10-13 DIAGNOSIS — I119 Hypertensive heart disease without heart failure: Secondary | ICD-10-CM | POA: Diagnosis not present

## 2017-10-13 DIAGNOSIS — E039 Hypothyroidism, unspecified: Secondary | ICD-10-CM | POA: Diagnosis not present

## 2017-10-13 DIAGNOSIS — I10 Essential (primary) hypertension: Secondary | ICD-10-CM | POA: Diagnosis not present

## 2017-10-13 DIAGNOSIS — I4891 Unspecified atrial fibrillation: Secondary | ICD-10-CM | POA: Diagnosis not present

## 2017-10-13 DIAGNOSIS — M170 Bilateral primary osteoarthritis of knee: Secondary | ICD-10-CM | POA: Diagnosis not present

## 2017-10-13 DIAGNOSIS — I5032 Chronic diastolic (congestive) heart failure: Secondary | ICD-10-CM | POA: Diagnosis not present

## 2017-11-08 DIAGNOSIS — H04123 Dry eye syndrome of bilateral lacrimal glands: Secondary | ICD-10-CM | POA: Diagnosis not present

## 2017-11-20 DIAGNOSIS — E039 Hypothyroidism, unspecified: Secondary | ICD-10-CM | POA: Diagnosis not present

## 2017-11-22 ENCOUNTER — Telehealth (HOSPITAL_COMMUNITY): Payer: Self-pay | Admitting: *Deleted

## 2017-11-22 MED ORDER — NEBIVOLOL HCL 5 MG PO TABS: 5.0000 mg | ORAL_TABLET | Freq: Two times a day (BID) | ORAL | 3 refills | Status: DC

## 2017-11-22 NOTE — Telephone Encounter (Signed)
Patient called in stating over the last 3 weeks she is having increasing breakthrough afib. She has had no change in health status or medication regimen. She currently takes verapamil 120mg  as needed for breakthrough but sometimes this takes over 12 hours to convert her and she also takes bystolic extra 1/2 tab at night for breakthrough. Discussed with Rudi Coco NP will try increasing bystolic to 5mg  BID and see if this reduces breakthrough. Pt will watch her BP with increasing the dose of bystolic. Pt will call back if symptoms continue despite increasing.

## 2017-11-23 ENCOUNTER — Other Ambulatory Visit: Payer: Self-pay | Admitting: Cardiology

## 2017-11-23 NOTE — Telephone Encounter (Signed)
Pt is a 84 yr old female who last saw Dr. Ladona Ridgel on 09/28/17. Weight at that visit was 92.5Kg. SCr on 05/25/17 was 0.85. Will refill Eliquis 5mg  BID.

## 2017-11-27 DIAGNOSIS — E039 Hypothyroidism, unspecified: Secondary | ICD-10-CM | POA: Diagnosis not present

## 2017-11-27 DIAGNOSIS — I4891 Unspecified atrial fibrillation: Secondary | ICD-10-CM | POA: Diagnosis not present

## 2017-11-27 DIAGNOSIS — E063 Autoimmune thyroiditis: Secondary | ICD-10-CM | POA: Diagnosis not present

## 2017-12-22 ENCOUNTER — Telehealth (HOSPITAL_COMMUNITY): Payer: Self-pay | Admitting: *Deleted

## 2017-12-22 ENCOUNTER — Other Ambulatory Visit: Payer: Self-pay | Admitting: Cardiology

## 2017-12-22 NOTE — Telephone Encounter (Signed)
Pt cld reporting that she has been having more breakthrough afib and has been taking verapamil 120 mg at onset of tachycardia and also again in the middle of the night about 6-8 hours apart until she feels she is back in rhythm.  Pt was advised initially that this was okay but then she informed us that she has been having blood pressures under 100 systolically and still taking verapamil.  Pt was advised per Ventura Sellers, RN that she should take her bp first and if systolic not over 100 she should not take extra bystolic or verapamil.  She should call the clinic for advisement.  Pt understood

## 2017-12-22 NOTE — Telephone Encounter (Signed)
Pt cld re pain and wanting to know if she can take CBD oil since she is unable to take ibuprofen and Aleve due to Eliquis.  Pt stated Tylenol does nothing for pain.  Pt wanted to know if she could take CBD oil. Pt was advised per pharmacy that CBD oil may affect the Eliquis and that it is not recommended.  Pt was advised to check with her PCP to see what pain med alternatives are available.  Pt understood.

## 2018-01-11 ENCOUNTER — Encounter: Payer: Self-pay | Admitting: Cardiology

## 2018-01-11 ENCOUNTER — Ambulatory Visit (INDEPENDENT_AMBULATORY_CARE_PROVIDER_SITE_OTHER): Payer: Medicare Other | Admitting: Cardiology

## 2018-01-11 VITALS — BP 120/70 | HR 68 | Ht 68.0 in | Wt 207.2 lb

## 2018-01-11 DIAGNOSIS — I4819 Other persistent atrial fibrillation: Secondary | ICD-10-CM

## 2018-01-11 DIAGNOSIS — I481 Persistent atrial fibrillation: Secondary | ICD-10-CM

## 2018-01-11 DIAGNOSIS — I272 Pulmonary hypertension, unspecified: Secondary | ICD-10-CM | POA: Diagnosis not present

## 2018-01-11 DIAGNOSIS — I10 Essential (primary) hypertension: Secondary | ICD-10-CM

## 2018-01-11 DIAGNOSIS — I5032 Chronic diastolic (congestive) heart failure: Secondary | ICD-10-CM | POA: Diagnosis not present

## 2018-01-11 NOTE — Patient Instructions (Signed)
Medication Instructions:  Your physician recommends that you continue on your current medications as directed. Please refer to the Current Medication list given to you today.   Labwork: Today: BMET and CBC  Testing/Procedures: Your physician has requested that you have an echocardiogram. Echocardiography is a painless test that uses sound waves to create images of your heart. It provides your doctor with information about the size and shape of your heart and how well your heart's chambers and valves are working. This procedure takes approximately one hour. There are no restrictions for this procedure.  Follow-Up: Your physician wants you to follow-up in: 6 Months with PA. You will receive a reminder letter in the mail two months in advance. If you don't receive a letter, please call our office to schedule the follow-up appointment.  Your physician wants you to follow-up in: 1 year with Dr. Mayford Knife. You will receive a reminder letter in the mail two months in advance. If you don't receive a letter, please call our office to schedule the follow-up appointment.  If you need a refill on your cardiac medications before your next appointment, please call your pharmacy.

## 2018-01-11 NOTE — Progress Notes (Signed)
Cardiology Office Note:    Date:  01/11/2018   ID:  Natasha [name], DOB [birth year], MRN [id number]  PCP:  Marden Noble, MD  Cardiologist:  Armanda Magic, MD    Referring MD: Marden Noble, MD   Chief Complaint  Patient presents with  . Atrial Fibrillation  . Hypertension    History of Present Illness:    Natasha [name] is a 84 y.o. female with a hx of persistent atrial fibrillation with 2 failed ablations at [facility] and multiple antiarrythmic intolerances (Multaq , Rythmol, flecaide, tikosyn, and pt refused amiodarone) , HTN, hypothyroidism, mild pulmonary hypertension ( ) and chronic diastolic dysfunction.    She has been seen at [facility] as well as in our office by Dr. Ladona Ridgel with EP who recommended3 options-->continue current course and prn bystolic 5 mg, begin amiodarone (which she does not wish to do) and AV node ablaltion with PPM. She chose to continue current meds with  verapmil, bystolic and prn bystolic. She was seen back by Dr. Ladona Ridgel and was having fatigue that was felt to possibly be due to her afib meds and it was recommended that her evening dose of Bystolic was stopped but she decided not to stop it.     I saw her back in the office on 01/10/2017 continue to complain of palpitations that were sporadic and severe fatigue which is a chronic problem as well.  She also has chronic dyspnea on exertion.   She was seen by Dr. Ladona Ridgel in June but no changes were made to his her medications but increased activity was recommended.  She is here today for followup and is doing well.  She denies any chest pain or pressure,  PND, orthopnea, LE edema, dizziness or syncope. She is compliant with her meds and is tolerating meds with no SE. she continues to have episodes of palpitation although says that she has been in sinus rhythm at these palpitations are more just a fast heartbeat.  She does not think there are episodes of A. fib.  Past Medical History:  Diagnosis Date  . Chronic diastolic  heart failure (HCC) 4/0/9811  . Complication of anesthesia   . Diverticular disease   . DJD (degenerative joint disease)   . Dysphagia   . GERD (gastroesophageal reflux disease)   . Hiatal hernia   . Hypertension   . Hypothyroidism   . Lower leg edema   . PAF (paroxysmal atrial fibrillation) (HCC)    s/p ablation x several times now with occasional episodes of PAF.   She has failed sotolol/dofetilide/flecainide/propafenone/Amio and dronedarone either with not controlling her PAF or having side effects  . PONV (postoperative nausea and vomiting)   . Restless leg syndrome   . Seasonal allergies     Past Surgical History:  Procedure Laterality Date  . ABDOMINAL HYSTERECTOMY    . APPENDECTOMY    . ATRIAL FIBRILLATION ABLATION  09-06-13   2'14 -[facility]  . BALLOON DILATION  03/03/2011   Procedure: BALLOON DILATION;  Surgeon: Charolett Bumpers, MD;  Location: [facility] ENDOSCOPY;  Service: Endoscopy;  Laterality: N/A;  . bladder polyps    . BREAST EXCISIONAL BIOPSY Left   . BREAST SURGERY     left lumpectomy  . CARDIOVERSION     x3  . CARDIOVERSION N/A 12/30/2014   Procedure: CARDIOVERSION;  Surgeon: Lewayne Bunting, MD;  Location: [facility] ENDOSCOPY;  Service: Cardiovascular;  Laterality: N/A;  . CARDIOVERSION N/A 04/23/2015   Procedure: CARDIOVERSION;  Surgeon: Thurmon Fair, MD;  Location: [facility] ENDOSCOPY;  Service: Cardiovascular;  Laterality: N/A;  . CATARACT EXTRACTION Left   . COLONOSCOPY WITH PROPOFOL N/A 09/24/2013   Procedure: COLONOSCOPY WITH PROPOFOL;  Surgeon: Charolett Bumpers, MD;  Location: [facility] ENDOSCOPY;  Service: Endoscopy;  Laterality: N/A;  . ESOPHAGOGASTRODUODENOSCOPY  03/03/2011   Procedure: ESOPHAGOGASTRODUODENOSCOPY (EGD);  Surgeon: Charolett Bumpers, MD;  Location: [facility] ENDOSCOPY;  Service: Endoscopy;  Laterality: N/A;  . EYE SURGERY     cataracat  . RHINOPLASTY    . RIGHT HEART CATHETERIZATION N/A 07/11/2014   Procedure: RIGHT HEART CATH;  Surgeon: Laurey Morale, MD;   Location: [facility] CATH LAB;  Service: Cardiovascular;  Laterality: N/A;  . ROTATOR CUFF REPAIR    . THYROIDECTOMY  1973  . TONSILLECTOMY  1942  . TUBAL LIGATION      Current Medications: Current Meds  Medication Sig  . Cholecalciferol (VITAMIN D) 2000 UNITS CAPS Take 2,000 Units by mouth daily. Reported on 04/22/2015  . ELIQUIS 5 MG TABS tablet TAKE 1 TABLET BY MOUTH 2 TIMES DAILY.  Marland Kitchen esomeprazole (NEXIUM) 20 MG capsule Take 20 mg by mouth every 3 (three) days.   Marland Kitchen estradiol (ESTRACE) 1 MG tablet Take 1 mg by mouth at bedtime.   . furosemide (LASIX) 40 MG tablet TAKE 1 TABLET BY MOUTH DAILY.  Marland Kitchen levothyroxine (SYNTHROID, LEVOTHROID) 125 MCG tablet Take 125 mcg by mouth daily before breakfast. BRAND ONLY  . LORazepam (ATIVAN) 1 MG tablet Take 0.5-1 mg by mouth at bedtime as needed for sleep. For sleep  . Magnesium 500 MG CAPS Take 500 mg by mouth daily.  . nebivolol (BYSTOLIC) 5 MG tablet Take 1 tablet (5 mg total) by mouth 2 (two) times daily.  . potassium chloride (K-DUR) 10 MEQ tablet TAKE 1 TABLET BY MOUTH DAILY  . Probiotic Product (FLORAJEN3 PO) Take by mouth.  . verapamil (CALAN-SR) 120 MG CR tablet Take 120 mg by mouth daily as needed (for rapid heart rate).      Allergies:   Codeine; Penicillins; Tetracycline; Tramadol; Digoxin; Digoxin and related; Erythromycin; Other; Sulfa antibiotics; Tetracyclines & related; Diltiazem; Diltiazem hcl; and Sulfites   Social History   Socioeconomic History  . Marital status: Married    Spouse name: Not on file  . Number of children: Not on file  . Years of education: Not on file  . Highest education level: Not on file  Occupational History  . Occupation: retired  Engineer, production  . Financial resource strain: Not on file  . Food insecurity:    Worry: Not on file    Inability: Not on file  . Transportation needs:    Medical: Not on file    Non-medical: Not on file  Tobacco Use  . Smoking status: Never Smoker  . Smokeless tobacco: Never Used   Substance and Sexual Activity  . Alcohol use: Yes    Alcohol/week: 0.0 standard drinks    Comment: rare wine  . Drug use: No  . Sexual activity: Never    Birth control/protection: Post-menopausal  Lifestyle  . Physical activity:    Days per week: Not on file    Minutes per session: Not on file  . Stress: Not on file  Relationships  . Social connections:    Talks on phone: Not on file    Gets together: Not on file    Attends religious service: Not on file    Active member of club or organization: Not on file    Attends meetings of clubs  or organizations: Not on file    Relationship status: Not on file  Other Topics Concern  . Not on file  Social History Narrative  . Not on file     Family History: The patient's family history includes CVA in her father; Cancer in her brother; Heart failure in her father; Hypertension in her sister; Lung cancer in her father; Sudden death in her brother; Transient ischemic attack in her mother. There is no history of Anesthesia problems, Hypotension, Malignant hyperthermia, Pseudochol deficiency, or Heart attack.  ROS:   Please see the history of present illness.    ROS  All other systems reviewed and negative.   EKGs/Labs/Other Studies Reviewed:    The following studies were reviewed today: none  EKG:  EKG is not ordered today.    Recent Labs: 05/25/2017: BUN 13; Creatinine, Ser 0.85; Hemoglobin 16.1; Platelets 255; Potassium 4.1; Sodium 135   Recent Lipid Panel No results found for: CHOL, TRIG, HDL, CHOLHDL, VLDL, LDLCALC, LDLDIRECT  Physical Exam:    VS:  BP 120/70   Pulse 68   Ht 5\' 8"  (1.727 m)   Wt 207 lb 3.2 oz (94 kg)   SpO2 95%   BMI 31.50 kg/m     Wt Readings from Last 3 Encounters:  01/11/18 207 lb 3.2 oz (94 kg)  09/28/17 204 lb (92.5 kg)  05/31/17 199 lb (90.3 kg)     GEN:  Well nourished, well developed in no acute distress HEENT: Normal NECK: No JVD; No carotid bruits LYMPHATICS: No  lymphadenopathy CARDIAC: RRR, no murmurs, rubs, gallops RESPIRATORY:  Clear to auscultation without rales, wheezing or rhonchi  ABDOMEN: Soft, non-tender, non-distended MUSCULOSKELETAL:  No edema; No deformity  SKIN: Warm and dry NEUROLOGIC:  Alert and oriented x 3 PSYCHIATRIC:  Normal affect   ASSESSMENT:    1. Chronic diastolic heart failure (HCC)   2. Essential hypertension   3. Persistent atrial fibrillation (HCC)   4. Pulmonary hypertension (HCC)    PLAN:    In order of problems listed above:  1.  Chronic diastolic CHF -she appears euvolemic on exam today.  She will continue on Lasix 40 mg daily.  Potassium was 4 on 03/23/2017.  I will check a bmet today.  2.  Hypertension -appears well controlled on exam today.  She will continue on Bystolic 5 mg twice daily.  3.  Persistent atrial fibrillation -she appears to be in normal sinus rhythm on exam today.  She has been having some episodes of palpitations but she says it is not A. fib.  They can occur twice a week and potentially last all day or only a few hours.  I recommended getting an event monitor to see what these are but at this point she does not want to pursue that.  She will continue on Eliquis 5 mg twice daily and verapamil 120 mg as needed.  She will also continue on Bystolic 5 mg twice daily.  Her creatinine was stable at 1.82 and hemoglobin 16.1 in December 2018.  I will repeat a CBC and bmet today.  4.  Pulmonary hypertension -echo 06/10/2016 showed mild pulmonary hypertension with PASP 36 mmHg.  I will repeat echo to reassess PA pressures.  5.  Shortness of breath - felt to be multifactorial secondary to sedentary state, obesity diastolic CHF.  Is been counseled on multiple occasions by myself as well as Dr. Ladona Ridgel to try to increase her activity level and also try to lose some weight.  Medication Adjustments/Labs and Tests Ordered: Current medicines are reviewed at length with the patient today.  Concerns regarding  medicines are outlined above.  No orders of the defined types were placed in this encounter.  No orders of the defined types were placed in this encounter.   Signed, Armanda Magic, MD  01/11/2018 4:21 PM    [facility]

## 2018-01-12 LAB — BASIC METABOLIC PANEL
BUN / CREAT RATIO: 16 (ref 12–28)
BUN: 15 mg/dL (ref 8–27)
CALCIUM: 8.8 mg/dL (ref 8.7–10.3)
CHLORIDE: 99 mmol/L (ref 96–106)
CO2: 22 mmol/L (ref 20–29)
CREATININE: 0.91 mg/dL (ref 0.57–1.00)
GFR, EST AFRICAN AMERICAN: 67 mL/min/{1.73_m2} (ref >59)
GFR, EST NON AFRICAN AMERICAN: 59 mL/min/{1.73_m2} — AB (ref >59)
Glucose: 62 mg/dL — ABNORMAL LOW (ref 65–99)
Potassium: 4 mmol/L (ref 3.5–5.2)
Sodium: 139 mmol/L (ref 134–144)

## 2018-01-12 LAB — CBC
HEMATOCRIT: 38.8 % (ref 34.0–46.6)
HEMOGLOBIN: 14.1 g/dL (ref 11.1–15.9)
MCH: 34.2 pg — ABNORMAL HIGH (ref 26.6–33.0)
MCHC: 36.3 g/dL — ABNORMAL HIGH (ref 31.5–35.7)
MCV: 94 fL (ref 79–97)
Platelets: 234 10*3/uL (ref 150–450)
RBC: 4.12 x10E6/uL (ref 3.77–5.28)
RDW: 12.1 % — ABNORMAL LOW (ref 12.3–15.4)
WBC: 6.8 10*3/uL (ref 3.4–10.8)

## 2018-01-15 ENCOUNTER — Ambulatory Visit (HOSPITAL_COMMUNITY): Payer: Medicare Other | Attending: Cardiology

## 2018-01-15 ENCOUNTER — Other Ambulatory Visit: Payer: Self-pay

## 2018-01-15 DIAGNOSIS — I4891 Unspecified atrial fibrillation: Secondary | ICD-10-CM | POA: Diagnosis not present

## 2018-01-15 DIAGNOSIS — I088 Other rheumatic multiple valve diseases: Secondary | ICD-10-CM | POA: Insufficient documentation

## 2018-01-15 DIAGNOSIS — I272 Pulmonary hypertension, unspecified: Secondary | ICD-10-CM | POA: Insufficient documentation

## 2018-01-15 DIAGNOSIS — I081 Rheumatic disorders of both mitral and tricuspid valves: Secondary | ICD-10-CM | POA: Insufficient documentation

## 2018-01-15 DIAGNOSIS — E039 Hypothyroidism, unspecified: Secondary | ICD-10-CM | POA: Insufficient documentation

## 2018-01-15 DIAGNOSIS — I119 Hypertensive heart disease without heart failure: Secondary | ICD-10-CM | POA: Insufficient documentation

## 2018-01-16 ENCOUNTER — Telehealth: Payer: Self-pay

## 2018-01-16 DIAGNOSIS — I5032 Chronic diastolic (congestive) heart failure: Secondary | ICD-10-CM

## 2018-01-16 DIAGNOSIS — I4819 Other persistent atrial fibrillation: Secondary | ICD-10-CM

## 2018-01-16 NOTE — Telephone Encounter (Signed)
Spoke with the patient about her echo results she expressed understanding and had no further questions. A Pro-BNP was ordered and scheduled for 10/2.

## 2018-01-16 NOTE — Telephone Encounter (Signed)
-----   Message from Quintella Reichert, MD sent at 01/15/2018  8:16 PM EDT ----- Please let patient know that echo showed normal LVF and increased stiffness of heart muscle due to HTN and aging.  Mildly leaky MV and moderately leaky TV.  NO significant change from prior echo.  Patient needs to work on weight loss. Please have her come in for BNP

## 2018-01-17 ENCOUNTER — Other Ambulatory Visit: Payer: Medicare Other | Admitting: *Deleted

## 2018-01-17 DIAGNOSIS — I5032 Chronic diastolic (congestive) heart failure: Secondary | ICD-10-CM | POA: Diagnosis not present

## 2018-01-17 DIAGNOSIS — M1711 Unilateral primary osteoarthritis, right knee: Secondary | ICD-10-CM | POA: Diagnosis not present

## 2018-01-17 DIAGNOSIS — I4819 Other persistent atrial fibrillation: Secondary | ICD-10-CM | POA: Diagnosis not present

## 2018-01-17 DIAGNOSIS — M1712 Unilateral primary osteoarthritis, left knee: Secondary | ICD-10-CM | POA: Diagnosis not present

## 2018-01-18 LAB — PRO B NATRIURETIC PEPTIDE: NT-Pro BNP: 244 pg/mL (ref 0–738)

## 2018-01-24 DIAGNOSIS — M1711 Unilateral primary osteoarthritis, right knee: Secondary | ICD-10-CM | POA: Diagnosis not present

## 2018-01-24 DIAGNOSIS — M170 Bilateral primary osteoarthritis of knee: Secondary | ICD-10-CM | POA: Diagnosis not present

## 2018-01-24 DIAGNOSIS — M1712 Unilateral primary osteoarthritis, left knee: Secondary | ICD-10-CM | POA: Diagnosis not present

## 2018-01-31 DIAGNOSIS — M1711 Unilateral primary osteoarthritis, right knee: Secondary | ICD-10-CM | POA: Diagnosis not present

## 2018-01-31 DIAGNOSIS — M1712 Unilateral primary osteoarthritis, left knee: Secondary | ICD-10-CM | POA: Diagnosis not present

## 2018-02-01 ENCOUNTER — Telehealth: Payer: Self-pay | Admitting: Cardiology

## 2018-02-01 NOTE — Telephone Encounter (Signed)
Spoke with the patient, she believes she has been going in and out of rhythm. Her heart rates usually are normal, but will bounce to 141 then back to normal after a little while. The patient does not have any energy. She declined having the event monitor, I advised that would most likely be the course needed.   Sending to Dr. Mayford Knife for recommendations.

## 2018-02-01 NOTE — Telephone Encounter (Signed)
  Patient states that she is having episodes of her heart rate being out of rhythm and would like to know if she should make an appt or if there is something she can do to make it better. Would like to speak with nurse before making an appt.

## 2018-02-04 NOTE — Telephone Encounter (Signed)
She needs to see Dr. Ladona Ridgel

## 2018-02-05 NOTE — Telephone Encounter (Signed)
Spoke with the patient she expressed understanding about seeing Dr. Ladona Ridgel. Sending a message to [facility] to schedule.

## 2018-02-14 DIAGNOSIS — Z23 Encounter for immunization: Secondary | ICD-10-CM | POA: Diagnosis not present

## 2018-02-16 ENCOUNTER — Encounter: Payer: Self-pay | Admitting: Internal Medicine

## 2018-02-16 ENCOUNTER — Ambulatory Visit (INDEPENDENT_AMBULATORY_CARE_PROVIDER_SITE_OTHER): Payer: Medicare Other | Admitting: Internal Medicine

## 2018-02-16 VITALS — BP 132/76 | HR 78 | Ht 68.0 in | Wt 203.0 lb

## 2018-02-16 DIAGNOSIS — I5032 Chronic diastolic (congestive) heart failure: Secondary | ICD-10-CM | POA: Diagnosis not present

## 2018-02-16 DIAGNOSIS — R5382 Chronic fatigue, unspecified: Secondary | ICD-10-CM

## 2018-02-16 DIAGNOSIS — I4819 Other persistent atrial fibrillation: Secondary | ICD-10-CM

## 2018-02-16 DIAGNOSIS — I272 Pulmonary hypertension, unspecified: Secondary | ICD-10-CM

## 2018-02-16 MED ORDER — AMIODARONE HCL 200 MG PO TABS: 200.0000 mg | ORAL_TABLET | Freq: Two times a day (BID) | ORAL | 0 refills | Status: DC

## 2018-02-16 MED ORDER — AMIODARONE HCL 200 MG PO TABS: 200.0000 mg | ORAL_TABLET | Freq: Every day | ORAL | 3 refills | Status: DC

## 2018-02-16 NOTE — Patient Instructions (Addendum)
Medication Instructions:  Your physician has recommended you make the following change in your medication:   1.  Start taking amiodarone 200 mg tablets--  A.  Take one tablet by mouth TWICE a day for 2 weeks starting tomorrow-- February 17, 2018 through March 03, 2018  B.  Start taking Amiodarone 200 mg---one tablet by mouth daily starting March 04, 2018  If you need a refill on your cardiac medications before your next appointment, please call your pharmacy.   Lab work: None ordered.  If you have labs (blood work) drawn today and your tests are completely normal, you will receive your results only by: Marland Kitchen MyChart Message (if you have MyChart) OR . A paper copy in the mail If you have any lab test that is abnormal or we need to change your treatment, we will call you to review the results.  Testing/Procedures: Your physician has recommended that you have a pulmonary function test. Pulmonary Function Tests are a group of tests that measure how well air moves in and out of your lungs.  Please schedule for PFT's   Follow-Up:  Your physician wants you to follow-up in: 6 weeks with Dr. Ladona Ridgel.       At [facility], you and your health needs are our priority.  As part of our continuing mission to provide you with exceptional heart care, we have created designated Provider Care Teams.  These Care Teams include your primary Cardiologist (physician) and Advanced Practice Providers (APPs -  Physician Assistants and Nurse Practitioners) who all work together to provide you with the care you need, when you need it. Bonita Quin may see Lewayne Bunting, MD or one of the following Advanced Practice Providers on your designated Care Team:   . Gypsy Balsam, NP . Francis Dowse, PA-C   Any Other Special Instructions Will Be Listed Below (If Applicable).    Amiodarone tablets What is this medicine? AMIODARONE (a MEE oh da rone) is an antiarrhythmic drug. It helps make your heart beat regularly. Because  of the side effects caused by this medicine, it is only used when other medicines have not worked. It is usually used for heartbeat problems that may be life threatening. This medicine may be used for other purposes; ask your health care provider or pharmacist if you have questions. COMMON BRAND NAME(S): Cordarone, Pacerone What should I tell my health care provider before I take this medicine? They need to know if you have any of these conditions: -liver disease -lung disease -other heart problems -thyroid disease -an unusual or allergic reaction to amiodarone, iodine, other medicines, foods, dyes, or preservatives -pregnant or trying to get pregnant -breast-feeding How should I use this medicine? Take this medicine by mouth with a glass of water. Follow the directions on the prescription label. You can take this medicine with or without food. However, you should always take it the same way each time. Take your doses at regular intervals. Do not take your medicine more often than directed. Do not stop taking except on the advice of your doctor or health care professional. A special MedGuide will be given to you by the pharmacist with each prescription and refill. Be sure to read this information carefully each time. Talk to your pediatrician regarding the use of this medicine in children. Special care may be needed. Overdosage: If you think you have taken too much of this medicine contact a poison control center or emergency room at once. NOTE: This medicine is only for you.  Do not share this medicine with others. What if I miss a dose? If you miss a dose, take it as soon as you can. If it is almost time for your next dose, take only that dose. Do not take double or extra doses. What may interact with this medicine? Do not take this medicine with any of the following medications: -abarelix -apomorphine -arsenic trioxide -certain antibiotics like erythromycin, gemifloxacin, levofloxacin,  pentamidine -certain medicines for depression like amoxapine, tricyclic antidepressants -certain medicines for fungal infections like fluconazole, itraconazole, ketoconazole, posaconazole, voriconazole -certain medicines for irregular heart beat like disopyramide, dofetilide, dronedarone, ibutilide, propafenone, sotalol -certain medicines for malaria like chloroquine, halofantrine -cisapride -droperidol -haloperidol -hawthorn -maprotiline -methadone -phenothiazines like chlorpromazine, mesoridazine, thioridazine -pimozide -ranolazine -red yeast rice -vardenafil -ziprasidone This medicine may also interact with the following medications: -antiviral medicines for HIV or AIDS -certain medicines for blood pressure, heart disease, irregular heart beat -certain medicines for cholesterol like atorvastatin, cerivastatin, lovastatin, simvastatin -certain medicines for hepatitis C like sofosbuvir and ledipasvir; sofosbuvir -certain medicines for seizures like phenytoin -certain medicines for thyroid problems -certain medicines that treat or prevent blood clots like warfarin -cholestyramine -cimetidine -clopidogrel -cyclosporine -dextromethorphan -diuretics -fentanyl -general anesthetics -grapefruit juice -lidocaine -loratadine -methotrexate -other medicines that prolong the QT interval (cause an abnormal heart rhythm) -procainamide -quinidine -rifabutin, rifampin, or rifapentine -St. John's Wort -trazodone This list may not describe all possible interactions. Give your health care provider a list of all the medicines, herbs, non-prescription drugs, or dietary supplements you use. Also tell them if you smoke, drink alcohol, or use illegal drugs. Some items may interact with your medicine. What should I watch for while using this medicine? Your condition will be monitored closely when you first begin therapy. Often, this drug is first started in a hospital or other monitored health  care setting. Once you are on maintenance therapy, visit your doctor or health care professional for regular checks on your progress. Because your condition and use of this medicine carry some risk, it is a good idea to carry an identification card, necklace or bracelet with details of your condition, medications, and doctor or health care professional. Bonita Quin may get drowsy or dizzy. Do not drive, use machinery, or do anything that needs mental alertness until you know how this medicine affects you. Do not stand or sit up quickly, especially if you are an older patient. This reduces the risk of dizzy or fainting spells. This medicine can make you more sensitive to the sun. Keep out of the sun. If you cannot avoid being in the sun, wear protective clothing and use sunscreen. Do not use sun lamps or tanning beds/booths. You should have regular eye exams before and during treatment. Call your doctor if you have blurred vision, see halos, or your eyes become sensitive to light. Your eyes may get dry. It may be helpful to use a lubricating eye solution or artificial tears solution. If you are going to have surgery or a procedure that requires contrast dyes, tell your doctor or health care professional that you are taking this medicine. What side effects may I notice from receiving this medicine? Side effects that you should report to your doctor or health care professional as soon as possible: -allergic reactions like skin rash, itching or hives, swelling of the face, lips, or tongue -blue-gray coloring of the skin -blurred vision, seeing blue green halos, increased sensitivity of the eyes to light -breathing problems -chest pain -dark urine -fast, irregular heartbeat -feeling faint or  light-headed -intolerance to heat or cold -nausea or vomiting -pain and swelling of the scrotum -pain, tingling, numbness in feet, hands -redness, blistering, peeling or loosening of the skin, including inside the  mouth -spitting up blood -stomach pain -sweating -unusual or uncontrolled movements of body -unusually weak or tired -weight gain or loss -yellowing of the eyes or skin Side effects that usually do not require medical attention (report to your doctor or health care professional if they continue or are bothersome): -change in sex drive or performance -constipation -dizziness -headache -loss of appetite -trouble sleeping This list may not describe all possible side effects. Call your doctor for medical advice about side effects. You may report side effects to FDA at 1-800-FDA-1088. Where should I keep my medicine? Keep out of the reach of children. Store at room temperature between 20 and 25 degrees C (68 and 77 degrees F). Protect from light. Keep container tightly closed. Throw away any unused medicine after the expiration date. NOTE: This sheet is a summary. It may not cover all possible information. If you have questions about this medicine, talk to your doctor, pharmacist, or health care provider.  2018 Elsevier/Gold Standard (2013-07-08 19:48:11)

## 2018-02-16 NOTE — Progress Notes (Signed)
HPI [name] returns today for followup of her atrial fib. She is a pleasant 84 yo woman with a long h/o atrial fib s/p ablation twice by Dr. Macon Large who I have followed now for a couple of years. She has not had syncope and her atrial fib has been fairly well controlled until about 3 months ago. In the interim, she has felt well with no chest pain. She has chronic mild dyspnea. She has known lung disease but a recent echo did not show much in the way of pulmonary HTN. She exhausted all AA drug therapy except for Amiodarone which she has never tried. Allergies  Allergen Reactions  . Codeine Nausea And Vomiting and Nausea Only  . Penicillins Hives    Has patient had a PCN reaction causing immediate rash, facial/tongue/throat swelling, SOB or lightheadedness with hypotension: No Has patient had a PCN reaction causing severe rash involving mucus membranes or skin necrosis: No Has patient had a PCN reaction that required hospitalization No Has patient had a PCN reaction occurring within the last 10 years: No If all of the above answers are "NO", then may proceed with Cephalosporin use.  . Tetracycline Rash  . Tramadol Nausea Only  . Digoxin Other (See Comments)    Flushed neck and face  . Digoxin And Related Other (See Comments)    ' Makes my face blood red"  . Erythromycin Other (See Comments) and Hives    Flushing on face  . Other Other (See Comments)    ECG leads cause skin irritation. ECG leads cause skin irritation.  . Sulfa Antibiotics Other (See Comments)    PRESERVATIVE OF SOME FOODS- MAKES FLUSHES TO FACE AND NECK  . Tetracyclines & Related Other (See Comments)    "Sores on legs"  . Diltiazem Rash and Other (See Comments)    Rash with long acting form  . Diltiazem Hcl Rash    Rash with long acting form  . Sulfites Rash    PRESERVATIVE OF SOME FOODS- MAKES FLUSHES TO FACE AND NECK PRESERVATIVE OF SOME FOODS- MAKES FLUSHES TO FACE AND NECK     Current Outpatient  Medications  Medication Sig Dispense Refill  . Cholecalciferol (VITAMIN D) 2000 UNITS CAPS Take 2,000 Units by mouth daily. Reported on 04/22/2015    . ELIQUIS 5 MG TABS tablet TAKE 1 TABLET BY MOUTH 2 TIMES DAILY. 60 tablet 6  . esomeprazole (NEXIUM) 20 MG capsule Take 20 mg by mouth every 3 (three) days.     Marland Kitchen estradiol (ESTRACE) 1 MG tablet Take 1 mg by mouth at bedtime.     . furosemide (LASIX) 40 MG tablet TAKE 1 TABLET BY MOUTH DAILY. 90 tablet 2  . levothyroxine (SYNTHROID, LEVOTHROID) 125 MCG tablet Take 125 mcg by mouth daily before breakfast. BRAND ONLY    . LORazepam (ATIVAN) 1 MG tablet Take 0.5-1 mg by mouth at bedtime as needed for sleep. For sleep    . Magnesium 500 MG CAPS Take 500 mg by mouth daily.    . nebivolol (BYSTOLIC) 5 MG tablet Take 1 tablet (5 mg total) by mouth 2 (two) times daily. 60 tablet 3  . potassium chloride (K-DUR) 10 MEQ tablet TAKE 1 TABLET BY MOUTH DAILY 30 tablet 11  . Probiotic Product (FLORAJEN3 PO) Take by mouth.    . verapamil (CALAN-SR) 120 MG CR tablet Take 120 mg by mouth daily as needed (for rapid heart rate).     Melene Muller ON 02/17/2018]  amiodarone (PACERONE) 200 MG tablet Take 1 tablet (200 mg total) by mouth 2 (two) times daily for 14 days. 28 tablet 0  . [START ON 03/04/2018] amiodarone (PACERONE) 200 MG tablet Take 1 tablet (200 mg total) by mouth daily. 90 tablet 3   No current facility-administered medications for this visit.      Past Medical History:  Diagnosis Date  . Chronic diastolic heart failure (HCC) 04/22/2015  . Complication of anesthesia   . Diverticular disease   . DJD (degenerative joint disease)   . Dysphagia   . GERD (gastroesophageal reflux disease)   . Hiatal hernia   . Hypertension   . Hypothyroidism   . Lower leg edema   . PAF (paroxysmal atrial fibrillation) (HCC)    s/p ablation x several times now with occasional episodes of PAF.   She has failed sotolol/dofetilide/flecainide/propafenone/Amio and dronedarone  either with not controlling her PAF or having side effects  . PONV (postoperative nausea and vomiting)   . Restless leg syndrome   . Seasonal allergies     ROS:   All systems reviewed and negative except as noted in the HPI.   Past Surgical History:  Procedure Laterality Date  . ABDOMINAL HYSTERECTOMY    . APPENDECTOMY    . ATRIAL FIBRILLATION ABLATION  09-06-13   2'14 -[facility]  . BALLOON DILATION  03/03/2011   Procedure: BALLOON DILATION;  Surgeon: Charolett Bumpers, MD;  Location: [facility];  Service: Endoscopy;  Laterality: N/A;  . bladder polyps    . BREAST EXCISIONAL BIOPSY Left   . BREAST SURGERY     left lumpectomy  . CARDIOVERSION     x3  . CARDIOVERSION N/A 12/30/2014   Procedure: CARDIOVERSION;  Surgeon: Lewayne Bunting, MD;  Location[facility] ENDOSCOPY;  Service: Cardiovascular;  Laterality: N/A;  . CARDIOVERSION N/A 04/23/2015   Procedure: CARDIOVERSION;  Surgeon: Thurmon Fair, MD;  Location: [facility] ENDOSCOPY;  Service: Cardiovascular;  Laterality: N/A;  . CATARACT EXTRACTION Left   . COLONOSCOPY WITH PROPOFOL N/A 09/24/2013   Procedure: COLONOSCOPY WITH PROPOFOL;  Surgeon: Charolett Bumpers, MD;  Location: [facility] ENDOSCOPY;  Service: Endoscopy;  Laterality: N/A;  . ESOPHAGOGASTRODUODENOSCOPY  03/03/2011   Procedure: ESOPHAGOGASTRODUODENOSCOPY (EGD);  Surgeon: Charolett Bumpers, MD;  Location: [facility] ENDOS[facility];  Service: Endoscopy;  Laterality: N/A;  . EYE SURGERY     cataracat  . RHINOPLASTY    . RIGHT HEART CATHETERIZATION N/A 07/11/2014   Procedure: RIGHT HEART CATH;  Surgeon: Laurey Morale, MD;  Location: [facility] CATH LAB;  Service: Cardiovascular;  Laterality: N/A;  . ROTATOR CUFF REPAIR    . THYROIDECTOMY  1973  . TONSILLECTOMY  1942  . TUBAL LIGATION       Family History  Problem Relation Age of Onset  . Transient ischemic attack Mother   . Heart failure Father   . CVA Father   . Lung cancer Father   . Hypertension Sister   . Cancer Brother   . Sudden death  Brother   . Anesthesia problems Neg Hx   . Hypotension Neg Hx   . Malignant hyperthermia Neg Hx   . Pseudochol deficiency Neg Hx   . Heart attack Neg Hx      Social History   Socioeconomic History  . Marital status: Married    Spouse name: Not on file  . Number of children: Not on file  . Years of education: Not on file  . Highest education level: Not on file  Occupational  History  . Occupation: retired  Engineer, production  . Financial resource strain: Not on file  . Food insecurity:    Worry: Not on file    Inability: Not on file  . Transportation needs:    Medical: Not on file    Non-medical: Not on file  Tobacco Use  . Smoking status: Never Smoker  . Smokeless tobacco: Never Used  Substance and Sexual Activity  . Alcohol use: Yes    Alcohol/week: 0.0 standard drinks    Comment: rare wine  . Drug use: No  . Sexual activity: Never    Birth control/protection: Post-menopausal  Lifestyle  . Physical activity:    Days per week: Not on file    Minutes per session: Not on file  . Stress: Not on file  Relationships  . Social connections:    Talks on phone: Not on file    Gets together: Not on file    Attends religious service: Not on file    Active member of club or organization: Not on file    Attends meetings of clubs or organizations: Not on file    Relationship status: Not on file  . Intimate partner violence:    Fear of current or ex partner: Not on file    Emotionally abused: Not on file    Physically abused: Not on file    Forced sexual activity: Not on file  Other Topics Concern  . Not on file  Social History Narrative  . Not on file     BP 132/76   Pulse 78   Ht 5\' 8"  (1.727 m)   Wt 203 lb (92.1 kg)   BMI 30.87 kg/m   Physical Exam:  Well appearing NAD HEENT: Unremarkable Neck:  No JVD, no thyromegally Lymphatics:  No adenopathy Back:  No CVA tenderness Lungs:  Clear with no wheezes HEART:  Regular rate rhythm, no murmurs, no rubs, no  clicks Abd:  soft, positive bowel sounds, no organomegally, no rebound, no guarding Ext:  2 plus pulses, no edema, no cyanosis, no clubbing Skin:  No rashes no nodules Neuro:  CN II through XII intact, motor grossly intact  EKG - NSR with slavos of atrial tachy   Assess/Plan: 1. PAF/Atrial tachy - she has had 2 ablations. She has become symptomatic. I have discussed the options for treatment including:  A. Repeat ablation B. Continue current meds C. PPM/AV node ablation D. Amiodarone.  She has considered her options and would like to try amiodarone. She will obtain PFT's with a [facility]. I spent over 25 minutes including 15 min of face to face time with the patient preparing this note.  Leonia Reeves.D.

## 2018-02-20 NOTE — Addendum Note (Signed)
Addended by: Eilan Mcinerny C on: 02/20/2018 08:01 AM   Modules accepted: Orders  

## 2018-02-27 DIAGNOSIS — M545 Low back pain: Secondary | ICD-10-CM | POA: Diagnosis not present

## 2018-02-27 DIAGNOSIS — M5136 Other intervertebral disc degeneration, lumbar region: Secondary | ICD-10-CM | POA: Diagnosis not present

## 2018-03-05 ENCOUNTER — Ambulatory Visit (INDEPENDENT_AMBULATORY_CARE_PROVIDER_SITE_OTHER): Payer: Medicare Other | Admitting: Internal Medicine

## 2018-03-05 DIAGNOSIS — R5382 Chronic fatigue, unspecified: Secondary | ICD-10-CM

## 2018-03-05 DIAGNOSIS — I272 Pulmonary hypertension, unspecified: Secondary | ICD-10-CM

## 2018-03-05 DIAGNOSIS — I4819 Other persistent atrial fibrillation: Secondary | ICD-10-CM | POA: Diagnosis not present

## 2018-03-05 DIAGNOSIS — I5032 Chronic diastolic (congestive) heart failure: Secondary | ICD-10-CM

## 2018-03-05 LAB — PULMONARY FUNCTION TEST
DL/VA % PRED: 99 %
DL/VA: 5.21 ml/min/mmHg/L
DLCO COR: 17.65 ml/min/mmHg
DLCO UNC % PRED: 60 %
DLCO cor % pred: 59 %
DLCO unc: 18.02 ml/min/mmHg
FEF 25-75 PRE: 1.4 L/s
FEF2575-%PRED-PRE: 93 %
FEV1-%Pred-Pre: 73 %
FEV1-Pre: 1.64 L
FEV1FVC-%PRED-PRE: 105 %
FEV6-%Pred-Pre: 74 %
FEV6-Pre: 2.12 L
FEV6FVC-%PRED-PRE: 105 %
FVC-%PRED-PRE: 70 %
FVC-PRE: 2.12 L
PRE FEV6/FVC RATIO: 100 %
Pre FEV1/FVC ratio: 78 %
RV % pred: 89 %
RV: 2.36 L
TLC % pred: 80 %
TLC: 4.55 L

## 2018-03-05 NOTE — Progress Notes (Signed)
Patient completed PFT today.Patient refused breathing treatment to complete the post spiro test today due to her heart condition. Therefore was not able to complete the post spiro.

## 2018-03-09 ENCOUNTER — Telehealth: Payer: Self-pay

## 2018-03-09 ENCOUNTER — Telehealth: Payer: Self-pay | Admitting: Internal Medicine

## 2018-03-09 NOTE — Telephone Encounter (Signed)
New Message:    Pt wants to know if she can stop taking her Bystolic. She feels like she is over medicated, she sometimes has no energy, but her heart is in rhythm,.

## 2018-03-09 NOTE — Telephone Encounter (Signed)
error 

## 2018-03-09 NOTE — Telephone Encounter (Signed)
Returned call to Pt.  Advised per Dr. Ladona Ridgel ok to discontinue Bystolic.  Advised Pt to step down to one tablet daily for one week and then discontinue.  Pt indicates understanding.  Will call with any concerns?

## 2018-03-22 DIAGNOSIS — M545 Low back pain: Secondary | ICD-10-CM | POA: Diagnosis not present

## 2018-03-29 DIAGNOSIS — M545 Low back pain: Secondary | ICD-10-CM | POA: Diagnosis not present

## 2018-04-03 ENCOUNTER — Ambulatory Visit (INDEPENDENT_AMBULATORY_CARE_PROVIDER_SITE_OTHER): Payer: Medicare Other | Admitting: Internal Medicine

## 2018-04-03 ENCOUNTER — Encounter: Payer: Self-pay | Admitting: Internal Medicine

## 2018-04-03 VITALS — BP 134/86 | HR 73 | Ht 69.0 in | Wt 209.8 lb

## 2018-04-03 DIAGNOSIS — I4819 Other persistent atrial fibrillation: Secondary | ICD-10-CM | POA: Diagnosis not present

## 2018-04-03 DIAGNOSIS — I5032 Chronic diastolic (congestive) heart failure: Secondary | ICD-10-CM

## 2018-04-03 DIAGNOSIS — R5382 Chronic fatigue, unspecified: Secondary | ICD-10-CM | POA: Diagnosis not present

## 2018-04-03 MED ORDER — NEBIVOLOL HCL 5 MG PO TABS: 5.0000 mg | ORAL_TABLET | Freq: Every day | ORAL | 3 refills | Status: DC

## 2018-04-03 NOTE — Patient Instructions (Addendum)
Medication Instructions:  Your physician has recommended you make the following change in your medication:  1.  Restart your Bystolic (nebivolol) 5 mg---Take one tablet by mouth daily  Labwork: None ordered.  Testing/Procedures: None ordered.  Follow-Up: Your physician wants you to follow-up in: March with Dr.Taylor.     June 19, 2018 at 2:00 pm.  Please arrive 15 minutes early.   Any Other Special Instructions Will Be Listed Below (If Applicable).  If you need a refill on your cardiac medications before your next appointment, please call your pharmacy.

## 2018-04-03 NOTE — Progress Notes (Signed)
HPI Ms. Sliter returns today for ongoing evaluation and followup of her atrial fib. She is a pleasant45 yo woman who has been treated with catheter ablation twice and has been on multiple AA drugs. She was placed on amiodarone after a prolonged conversation at her last visit. In the interim she has had a host of complaints including HA, a very fine tremor, fatigue and palpitations. She is not sure if she has been out of rhythm. She took 400 mg daily for a couple of weeks then 200 mg daily since.  Allergies  Allergen Reactions  . Codeine Nausea And Vomiting and Nausea Only  . Penicillins Hives    Has patient had a PCN reaction causing immediate rash, facial/tongue/throat swelling, SOB or lightheadedness with hypotension: No Has patient had a PCN reaction causing severe rash involving mucus membranes or skin necrosis: No Has patient had a PCN reaction that required hospitalization No Has patient had a PCN reaction occurring within the last 10 years: No If all of the above answers are "NO", then may proceed with Cephalosporin use.  . Tetracycline Rash  . Tramadol Nausea Only  . Digoxin Other (See Comments)    Flushed neck and face  . Digoxin And Related Other (See Comments)    ' Makes my face blood red"  . Erythromycin Other (See Comments) and Hives    Flushing on face  . Other Other (See Comments)    ECG leads cause skin irritation. ECG leads cause skin irritation.  . Sulfa Antibiotics Other (See Comments)    PRESERVATIVE OF SOME FOODS- MAKES FLUSHES TO FACE AND NECK  . Tetracyclines & Related Other (See Comments)    "Sores on legs"  . Diltiazem Rash and Other (See Comments)    Rash with long acting form  . Diltiazem Hcl Rash    Rash with long acting form  . Sulfites Rash    PRESERVATIVE OF SOME FOODS- MAKES FLUSHES TO FACE AND NECK PRESERVATIVE OF SOME FOODS- MAKES FLUSHES TO FACE AND NECK     Current Outpatient Medications  Medication Sig Dispense Refill  . amiodarone  (PACERONE) 200 MG tablet Take 1 tablet (200 mg total) by mouth daily. 90 tablet 3  . Cholecalciferol (VITAMIN D) 2000 UNITS CAPS Take 2,000 Units by mouth daily. Reported on 04/22/2015    . ELIQUIS 5 MG TABS tablet TAKE 1 TABLET BY MOUTH 2 TIMES DAILY. 60 tablet 6  . esomeprazole (NEXIUM) 20 MG capsule Take 20 mg by mouth every 3 (three) days.     Marland Kitchen estradiol (ESTRACE) 1 MG tablet Take 1 mg by mouth at bedtime.     . furosemide (LASIX) 40 MG tablet TAKE 1 TABLET BY MOUTH DAILY. 90 tablet 2  . levothyroxine (SYNTHROID, LEVOTHROID) 125 MCG tablet Take 125 mcg by mouth daily before breakfast. BRAND ONLY    . LORazepam (ATIVAN) 1 MG tablet Take 0.5-1 mg by mouth at bedtime as needed for sleep. For sleep    . Magnesium 500 MG CAPS Take 500 mg by mouth daily.    . potassium chloride (K-DUR) 10 MEQ tablet TAKE 1 TABLET BY MOUTH DAILY 30 tablet 11  . Probiotic Product (FLORAJEN3 PO) Take by mouth.    . verapamil (CALAN-SR) 120 MG CR tablet Take 120 mg by mouth daily as needed (for rapid heart rate).     . nebivolol (BYSTOLIC) 5 MG tablet Take 1 tablet (5 mg total) by mouth daily. 90 tablet 3   No  current facility-administered medications for this visit.      Past Medical History:  Diagnosis Date  . Chronic diastolic heart failure (HCC) 04/22/2015  . Complication of anesthesia   . Diverticular disease   . DJD (degenerative joint disease)   . Dysphagia   . GERD (gastroesophageal reflux disease)   . Hiatal hernia   . Hypertension   . Hypothyroidism   . Lower leg edema   . PAF (paroxysmal atrial fibrillation) (HCC)    s/p ablation x several times now with occasional episodes of PAF.   She has failed sotolol/dofetilide/flecainide/propafenone/Amio and dronedarone either with not controlling her PAF or having side effects  . PONV (postoperative nausea and vomiting)   . Restless leg syndrome   . Seasonal allergies     ROS:   All systems reviewed and negative except as noted in the HPI.   Past  Surgical History:  Procedure Laterality Date  . ABDOMINAL HYSTERECTOMY    . APPENDECTOMY    . ATRIAL FIBRILLATION ABLATION  09-06-13   2'14 -[facility]  . BALLOON DILATION  03/03/2011   Procedure: BALLOON DILATION;  Surgeon: Charolett Bumpers, MD;  Location: [facility];  Service: Endoscopy;  Laterality: N/A;  . bladder polyps    . BREAST EXCISIONAL BIOPSY Left   . BREAST SURGERY     left lumpectomy  . CARDIOVERSION     x3  . CARDIOVERSION N/A 12/30/2014   Procedure: CARDIOVERSION;  Surgeon: Lewayne Bunting, MD;  [facility];  Service: Cardiovascular;  Laterality: N/A;  . CARDIOVERSION N/A 04/23/2015   Procedure: CARDIOVERSION;  Surgeon: Thurmon Fair, MD;  Location: [facility] ENDOSCOPY;  Service: Cardiovascular;  Laterality: N/A;  . CATARACT EXTRACTION Left   . COLONOSCOPY WITH PROPOFOL N/A 09/24/2013   Procedure: COLONOSCOPY WITH PROPOFOL;  Surgeon: Charolett Bumpers, MD;  Location: [facility] ENDOSCOPY;  Service: Endoscopy;  Laterality: N/A;  . ESOPHAGOGASTRODUODENOSCOPY  03/03/2011   Procedure: ESOPHAGOGASTRODUODENOSCOPY (EGD);  Surgeon: Charolett Bumpers, MD;  Location: [facility];  Service: Endoscopy;  Laterality: N/A;  . EYE SURGERY     cataracat  . RHINOPLASTY    . RIGHT HEART CATHETERIZATION N/A 07/11/2014   Procedure: RIGHT HEART CATH;  Surgeon: Laurey Morale, MD;  Location: [facility] CATH LAB;  Service: Cardiovascular;  Laterality: N/A;  . ROTATOR CUFF REPAIR    . THYROIDECTOMY  1973  . TONSILLECTOMY  1942  . TUBAL LIGATION       Family History  Problem Relation Age of Onset  . Transient ischemic attack Mother   . Heart failure Father   . CVA Father   . Lung cancer Father   . Hypertension Sister   . Cancer Brother   . Sudden death Brother   . Anesthesia problems Neg Hx   . Hypotension Neg Hx   . Malignant hyperthermia Neg Hx   . Pseudochol deficiency Neg Hx   . Heart attack Neg Hx      Social History   Socioeconomic History  . Marital status: Married    Spouse  name: Not on file  . Number of children: Not on file  . Years of education: Not on file  . Highest education level: Not on file  Occupational History  . Occupation: retired  Engineer, production  . Financial resource strain: Not on file  . Food insecurity:    Worry: Not on file    Inability: Not on file  . Transportation needs:    Medical: Not on file  Non-medical: Not on file  Tobacco Use  . Smoking status: Never Smoker  . Smokeless tobacco: Never Used  Substance and Sexual Activity  . Alcohol use: Yes    Alcohol/week: 0.0 standard drinks    Comment: rare wine  . Drug use: No  . Sexual activity: Never    Birth control/protection: Post-menopausal  Lifestyle  . Physical activity:    Days per week: Not on file    Minutes per session: Not on file  . Stress: Not on file  Relationships  . Social connections:    Talks on phone: Not on file    Gets together: Not on file    Attends religious service: Not on file    Active member of club or organization: Not on file    Attends meetings of clubs or organizations: Not on file    Relationship status: Not on file  . Intimate partner violence:    Fear of current or ex partner: Not on file    Emotionally abused: Not on file    Physically abused: Not on file    Forced sexual activity: Not on file  Other Topics Concern  . Not on file  Social History Narrative  . Not on file     BP 134/86   Pulse 73   Ht 5\' 9"  (1.753 m)   Wt 209 lb 12.8 oz (95.2 kg)   SpO2 97%   BMI 30.98 kg/m   Physical Exam:  Well appearing NAD HEENT: Unremarkable Neck:  No JVD, no thyromegally Lymphatics:  No adenopathy Back:  No CVA tenderness Lungs:  Clear HEART:  Regular rate rhythm, no murmurs, no rubs, no clicks Abd:  soft, positive bowel sounds, no organomegally, no rebound, no guarding Ext:  2 plus pulses, no edema, no cyanosis, no clubbing Skin:  No rashes no nodules Neuro:  CN II through XII intact, motor grossly intact  EKG - nsr with  PVC's  Assess/Plan: 1. PAF - she is maintaining NSR on amiodarone 200 mg daily. I will continue this dose and plan to down titrate over the next 6 months.  2. HTN - her blood pressure is good today but has been high. I have asked her to restart low dose bystolic. 3. COPD - she has had PFT's which show a modest reduction in lung function. She never smoked. We will plan to repeat her PFT's in 6-46months. 4. HA - we discussed her current regimen for treatment. I encouraged her to seek medical attention if her HA persists.  Leonia Reeves.D.

## 2018-04-05 DIAGNOSIS — M545 Low back pain: Secondary | ICD-10-CM | POA: Diagnosis not present

## 2018-04-16 DIAGNOSIS — M545 Low back pain: Secondary | ICD-10-CM | POA: Diagnosis not present

## 2018-04-19 DIAGNOSIS — M545 Low back pain: Secondary | ICD-10-CM | POA: Diagnosis not present

## 2018-04-25 ENCOUNTER — Ambulatory Visit: Payer: Medicare Other | Admitting: Internal Medicine

## 2018-05-01 ENCOUNTER — Other Ambulatory Visit: Payer: Self-pay | Admitting: Physical Medicine and Rehabilitation

## 2018-05-01 DIAGNOSIS — M545 Low back pain, unspecified: Secondary | ICD-10-CM

## 2018-05-02 ENCOUNTER — Ambulatory Visit
Admission: RE | Admit: 2018-05-02 | Discharge: 2018-05-02 | Disposition: A | Discharge status: Home / Self Care | Payer: Medicare Other | Source: Ambulatory Visit | Attending: Physical Medicine and Rehabilitation | Admitting: Physical Medicine and Rehabilitation

## 2018-05-02 DIAGNOSIS — M5127 Other intervertebral disc displacement, lumbosacral region: Secondary | ICD-10-CM | POA: Diagnosis not present

## 2018-05-02 DIAGNOSIS — M5126 Other intervertebral disc displacement, lumbar region: Secondary | ICD-10-CM | POA: Diagnosis not present

## 2018-05-02 DIAGNOSIS — M545 Low back pain, unspecified: Secondary | ICD-10-CM

## 2018-05-15 ENCOUNTER — Other Ambulatory Visit: Payer: Self-pay | Admitting: Internal Medicine

## 2018-05-15 DIAGNOSIS — Z1231 Encounter for screening mammogram for malignant neoplasm of breast: Secondary | ICD-10-CM

## 2018-05-29 DIAGNOSIS — M47816 Spondylosis without myelopathy or radiculopathy, lumbar region: Secondary | ICD-10-CM | POA: Diagnosis not present

## 2018-06-05 ENCOUNTER — Telehealth: Payer: Self-pay | Admitting: *Deleted

## 2018-06-05 NOTE — Telephone Encounter (Signed)
   [facility] Pre-operative Risk Assessment    Request for surgical clearance:  1. What type of surgery is being performed? B/L LUMBAR FACET INJECTIONS   2. When is this surgery scheduled? 06/23/18   3. What type of clearance is required (medical clearance vs. Pharmacy clearance to hold med vs. Both)? BOTH  4. Are there any medications that need to be held prior to surgery and how long?ELIQUIS X 3 DAYS PRIOR    5. Practice name and name of physician performing surgery? [facility]   6. What is your office phone number [phone]    7.   What is your office fax number [phone]  8.   Anesthesia type (None, local, MAC, general) ? NONE LISTED   Julaine Hua 06/05/2018, 3:56 PM  _________________________________________________________________   (provider comments below)

## 2018-06-05 NOTE — Telephone Encounter (Signed)
Patient with diagnosis of Afib on Eliquis for anticoagulation.    Procedure: lumbar facet injections Date of procedure: 06/23/18  CHADS2-VASc score of  5 (CHF, HTN, AGE, DM2, stroke/tia x 2, CAD, AGE, female)  Per office protocol, patient can hold Eliquis for 3 days prior to procedure.

## 2018-06-06 NOTE — Telephone Encounter (Signed)
   Primary Cardiologist: Armanda Magic, MD Electrophysiologist: Dr. Ladona Ridgel  Chart reviewed as part of pre-operative protocol coverage. Patient continues to have intermittent palpitations since last seen by Dr. Ladona Ridgel 04/03/18. Says "few times per week" but very short lasting. "just for few minutes". Apart from this, she has mild constipation and abdominal pain. She is going to see GI next week. Her chronic dyspnea from COPD is stable. Overall feels "Okay".   Dr. Ladona Ridgel, are you okay wit hold Eliquis given intermittent afib since your last visit? Please give your recommendations. Please forward your response to [facility] PREOP.   Thank you  Manson Passey, PA 06/06/2018, 9:46 AM

## 2018-06-12 NOTE — Telephone Encounter (Signed)
   Primary Cardiologist: Armanda Magic, MD  Chart reviewed as part of pre-operative protocol coverage. Patient was contacted 06/12/2018 in reference to pre-operative risk assessment for pending surgery as outlined below.  Natasha [name] was last seen on 04/03/18 by Dr. Ladona Ridgel.  Since that day, Natasha [name] has been having some breakthrough afib although the rate is not fast. She says that it stays in rhythm most of the time but then she has bouts like last night. She is not symptomatic with it. She has verapamil to take if rate is fast but she has not needed it. We discussed that while she is off A/C she has a slightly increased risk of stroke and this is why we limit the time off of A/C. She feels that she needs the injection to control pain as pain may be contributing to her bouts of afib. She is also have GI upset that she feels is stressing her and plans to see her PCP this week to evaluate.  She will call our office if she develops fast or symptomatic afib.   Therefore, based on ACC/AHA guidelines, the patient would be at acceptable risk for the planned procedure without further cardiovascular testing.   According to our pharmacy protocol: Patient with diagnosis of Afib on Eliquis for anticoagulation.   Procedure: lumbar facet injections Date of procedure: 06/23/18  CHADS2-VASc score of  5 (CHF, HTN, AGE, DM2, stroke/tia x 2, CAD, AGE, female) Per office protocol, patient can hold Eliquis for 3 days prior to procedure.    I will route this recommendation to the requesting party via Epic fax function and remove from pre-op pool.  Please call with questions.  Berton Bon, NP 06/12/2018, 9:10 AM

## 2018-06-12 NOTE — Telephone Encounter (Signed)
I spoke with Dr. Ladona Ridgel regarding holding anticoagulation in the setting of some brief episodes of PAF. He thinks that she will always have some breakthrough afib and that she is OK to have procedure. I will explain to pt that their is a slightly increased risk of stroke while off anticoagulation.

## 2018-06-13 ENCOUNTER — Other Ambulatory Visit (HOSPITAL_COMMUNITY): Payer: Self-pay | Admitting: Nurse Practitioner

## 2018-06-13 DIAGNOSIS — J449 Chronic obstructive pulmonary disease, unspecified: Secondary | ICD-10-CM | POA: Diagnosis not present

## 2018-06-13 DIAGNOSIS — M545 Low back pain: Secondary | ICD-10-CM | POA: Diagnosis not present

## 2018-06-13 DIAGNOSIS — Z1389 Encounter for screening for other disorder: Secondary | ICD-10-CM | POA: Diagnosis not present

## 2018-06-13 DIAGNOSIS — I10 Essential (primary) hypertension: Secondary | ICD-10-CM | POA: Diagnosis not present

## 2018-06-13 DIAGNOSIS — I4891 Unspecified atrial fibrillation: Secondary | ICD-10-CM | POA: Diagnosis not present

## 2018-06-13 DIAGNOSIS — M170 Bilateral primary osteoarthritis of knee: Secondary | ICD-10-CM | POA: Diagnosis not present

## 2018-06-13 DIAGNOSIS — I5032 Chronic diastolic (congestive) heart failure: Secondary | ICD-10-CM | POA: Diagnosis not present

## 2018-06-13 DIAGNOSIS — I119 Hypertensive heart disease without heart failure: Secondary | ICD-10-CM | POA: Diagnosis not present

## 2018-06-13 DIAGNOSIS — Z0000 Encounter for general adult medical examination without abnormal findings: Secondary | ICD-10-CM | POA: Diagnosis not present

## 2018-06-13 DIAGNOSIS — R1030 Lower abdominal pain, unspecified: Secondary | ICD-10-CM | POA: Diagnosis not present

## 2018-06-13 DIAGNOSIS — E039 Hypothyroidism, unspecified: Secondary | ICD-10-CM | POA: Diagnosis not present

## 2018-06-18 ENCOUNTER — Ambulatory Visit: Payer: Medicare Other

## 2018-06-19 ENCOUNTER — Ambulatory Visit: Payer: Medicare Other | Admitting: Internal Medicine

## 2018-06-20 ENCOUNTER — Other Ambulatory Visit: Payer: Self-pay | Admitting: Internal Medicine

## 2018-06-20 NOTE — Telephone Encounter (Signed)
Eliquis 5mg  refill request received; pt is 85 yrs old, wt-95.2kg, Crea-0.91 on 01/11/18, last seen by Dr. Ladona Ridgel on 04/03/2018; will send in refill to requested pharmacy.

## 2018-06-21 ENCOUNTER — Other Ambulatory Visit: Payer: Self-pay | Admitting: Internal Medicine

## 2018-06-21 DIAGNOSIS — R109 Unspecified abdominal pain: Secondary | ICD-10-CM

## 2018-06-28 ENCOUNTER — Other Ambulatory Visit: Payer: Self-pay

## 2018-06-28 ENCOUNTER — Encounter

## 2018-06-28 ENCOUNTER — Encounter: Payer: Self-pay | Admitting: Internal Medicine

## 2018-06-28 ENCOUNTER — Other Ambulatory Visit: Payer: Medicare Other

## 2018-06-28 ENCOUNTER — Ambulatory Visit (INDEPENDENT_AMBULATORY_CARE_PROVIDER_SITE_OTHER): Payer: Medicare Other | Admitting: Internal Medicine

## 2018-06-28 VITALS — BP 126/78 | HR 66 | Ht 69.0 in | Wt 209.0 lb

## 2018-06-28 DIAGNOSIS — I4819 Other persistent atrial fibrillation: Secondary | ICD-10-CM

## 2018-06-28 MED ORDER — AMIODARONE HCL 200 MG PO TABS: 200.0000 mg | ORAL_TABLET | Freq: Every day | ORAL | 3 refills | Status: DC

## 2018-06-28 NOTE — Patient Instructions (Signed)
Medication Instructions:  Your physician has recommended you make the following change in your medication:  1.) change amiodarone to --200 mg daily and NONE ON SUNDAYS.  If you need a refill on your cardiac medications before your next appointment, please call your pharmacy.   Lab work: Today: sed rate, tsh, liver panel If you have labs (blood work) drawn today and your tests are completely normal, you will receive your results only by: Marland Kitchen MyChart Message (if you have MyChart) OR . A paper copy in the mail If you have any lab test that is abnormal or we need to change your treatment, we will call you to review the results.  Testing/Procedures: none   Any Other Special Instructions Will Be Listed Below (If Applicable).

## 2018-06-28 NOTE — Progress Notes (Signed)
HPI Natasha [name] returns today for ongoing evaluation and followup of her atrial fib. She is a pleasan 85 yo woman who has been treated with catheter ablation twice and has been on multiple AA drugs. She was placed on amiodarone after a prolonged conversation at her last visit. In the interim she has had a host of complaints including HA, a very fine tremor, fatigue and palpitations. She is not sure if she has been out of rhythm. She took 400 mg daily for a couple of weeks then 200 mg daily since. She c/o a cough over the past week. No symptoms of atrial fib or flutter however.  Allergies  Allergen Reactions  . Codeine Nausea And Vomiting and Nausea Only  . Penicillins Hives    Has patient had a PCN reaction causing immediate rash, facial/tongue/throat swelling, SOB or lightheadedness with hypotension: No Has patient had a PCN reaction causing severe rash involving mucus membranes or skin necrosis: No Has patient had a PCN reaction that required hospitalization No Has patient had a PCN reaction occurring within the last 10 years: No If all of the above answers are "NO", then may proceed with Cephalosporin use.  . Tetracycline Rash  . Tramadol Nausea Only  . Digoxin Other (See Comments)    Flushed neck and face  . Digoxin And Related Other (See Comments)    ' Makes my face blood red"  . Erythromycin Other (See Comments) and Hives    Flushing on face  . Other Other (See Comments)    ECG leads cause skin irritation. ECG leads cause skin irritation.  . Sulfa Antibiotics Other (See Comments)    PRESERVATIVE OF SOME FOODS- MAKES FLUSHES TO FACE AND NECK  . Tetracyclines & Related Other (See Comments)    "Sores on legs"  . Diltiazem Rash and Other (See Comments)    Rash with long acting form  . Diltiazem Hcl Rash    Rash with long acting form  . Sulfites Rash    PRESERVATIVE OF SOME FOODS- MAKES FLUSHES TO FACE AND NECK PRESERVATIVE OF SOME FOODS- MAKES FLUSHES TO FACE AND NECK      Current Outpatient Medications  Medication Sig Dispense Refill  . Cholecalciferol (VITAMIN D) 2000 UNITS CAPS Take 2,000 Units by mouth daily. Reported on 04/22/2015    . ELIQUIS 5 MG TABS tablet TAKE 1 TABLET BY MOUTH 2 TIMES DAILY. 60 tablet 6  . esomeprazole (NEXIUM) 20 MG capsule Take 20 mg by mouth every 3 (three) days.     Marland Kitchen estradiol (ESTRACE) 1 MG tablet Take 1 mg by mouth at bedtime.     . furosemide (LASIX) 40 MG tablet TAKE 1 TABLET BY MOUTH DAILY. 90 tablet 2  . levothyroxine (SYNTHROID, LEVOTHROID) 125 MCG tablet Take 125 mcg by mouth daily before breakfast. BRAND ONLY    . LORazepam (ATIVAN) 1 MG tablet Take 0.5-1 mg by mouth at bedtime as needed for sleep. For sleep    . Magnesium 500 MG CAPS Take 500 mg by mouth daily.    . nebivolol (BYSTOLIC) 5 MG tablet Take 1 tablet (5 mg total) by mouth daily. 90 tablet 2  . potassium chloride (K-DUR) 10 MEQ tablet TAKE 1 TABLET BY MOUTH DAILY 30 tablet 11  . Probiotic Product (FLORAJEN3 PO) Take by mouth.    . verapamil (CALAN-SR) 120 MG CR tablet Take 120 mg by mouth daily as needed (for rapid heart rate).     Marland Kitchen amiodarone (PACERONE) 200  MG tablet Take 1 tablet (200 mg total) by mouth daily. None on Sundays 90 tablet 3   No current facility-administered medications for this visit.      Past Medical History:  Diagnosis Date  . Chronic diastolic heart failure ([place]) 04/22/2015  . Complication of anesthesia   . Diverticular disease   . DJD (degenerative joint disease)   . Dysphagia   . GERD (gastroesophageal reflux disease)   . Hiatal hernia   . Hypertension   . Hypothyroidism   . Lower leg edema   . PAF (paroxysmal atrial fibrillation) (HCC)    s/p ablation x several times now with occasional episodes of PAF.   She has failed sotolol/dofetilide/flecainide/propafenone/Amio and dronedarone either with not controlling her PAF or having side effects  . PONV (postoperative nausea and vomiting)   . Restless leg syndrome   . Seasonal  allergies     ROS:   All systems reviewed and negative except as noted in the HPI.   Past Surgical History:  Procedure Laterality Date  . ABDOMINAL HYSTERECTOMY    . APPENDECTOMY    . ATRIAL FIBRILLATION ABLATION  09-06-13   2'14 -[facility]  . BALLOON DILATION  03/03/2011   Procedure: BALLOON DILATION;  Surgeon: Garlan Fair, MD;  Location: [facility];  Service: Endoscopy;  Laterality: N/A;  . bladder polyps    . BREAST EXCISIONAL BIOPSY Left   . BREAST SURGERY     left lumpectomy  . CARDIOVERSION     x3  . CARDIOVERSION N/A 12/30/2014   Procedure: CARDIOVERSION;  Surgeon: Lelon Perla, MD;  Location: [facility] ENDOSCOPY;  Service: Cardiovascular;  Laterality: N/A;  . CARDIOVERSION N/A 04/23/2015   Procedure: CARDIOVERSION;  Surgeon: Sanda Klein, MD;  Location: [facility] ENDOSCOPY;  Service: Cardiovascular;  Laterality: N/A;  . CATARACT EXTRACTION Left   . COLONOSCOPY WITH PROPOFOL N/A 09/24/2013   Procedure: COLONOSCOPY WITH PROPOFOL;  Surgeon: Garlan Fair, MD;  Location: [facility] ENDOSCOPY;  Service: Endoscopy;  Laterality: N/A;  . ESOPHAGOGASTRODUODENOSCOPY  03/03/2011   Procedure: ESOPHAGOGASTRODUODENOSCOPY (EGD);  Surgeon: Garlan Fair, MD;  Location: [facility];  Service: Endoscopy;  Laterality: N/A;  . EYE SURGERY     cataracat  . RHINOPLASTY    . RIGHT HEART CATHETERIZATION N/A 07/11/2014   Procedure: RIGHT HEART CATH;  Surgeon: Larey Dresser, MD;  Location: [facility] CATH LAB;  Service: Cardiovascular;  Laterality: N/A;  . ROTATOR CUFF REPAIR    . THYROIDECTOMY  1973  . TONSILLECTOMY  1942  . TUBAL LIGATION       Family History  Problem Relation Age of Onset  . Transient ischemic attack Mother   . Heart failure Father   . CVA Father   . Lung cancer Father   . Hypertension Sister   . Cancer Brother   . Sudden death Brother   . Anesthesia problems Neg Hx   . Hypotension Neg Hx   . Malignant hyperthermia Neg Hx   . Pseudochol deficiency Neg Hx   . Heart  attack Neg Hx      Social History   Socioeconomic History  . Marital status: Married    Spouse name: Not on file  . Number of children: Not on file  . Years of education: Not on file  . Highest education level: Not on file  Occupational History  . Occupation: retired  Scientific laboratory technician  . Financial resource strain: Not on file  . Food insecurity:    Worry: Not on file  Inability: Not on file  . Transportation needs:    Medical: Not on file    Non-medical: Not on file  Tobacco Use  . Smoking status: Never Smoker  . Smokeless tobacco: Never Used  Substance and Sexual Activity  . Alcohol use: Yes    Alcohol/week: 0.0 standard drinks    Comment: rare wine  . Drug use: No  . Sexual activity: Never    Birth control/protection: Post-menopausal  Lifestyle  . Physical activity:    Days per week: Not on file    Minutes per session: Not on file  . Stress: Not on file  Relationships  . Social connections:    Talks on phone: Not on file    Gets together: Not on file    Attends religious service: Not on file    Active member of club or organization: Not on file    Attends meetings of clubs or organizations: Not on file    Relationship status: Not on file  . Intimate partner violence:    Fear of current or ex partner: Not on file    Emotionally abused: Not on file    Physically abused: Not on file    Forced sexual activity: Not on file  Other Topics Concern  . Not on file  Social History Narrative  . Not on file     BP 126/78   Pulse 66   Ht _0  (1.753 m)   Wt 209 lb (94.8 kg)   SpO2 98%   BMI 30.86 kg/m   Physical Exam:  Well appearing NAD HEENT: Unremarkable Neck:  No JVD, no thyromegally Lymphatics:  No adenopathy Back:  No CVA tenderness Lungs:  Clear with no wheezes HEART:  Regular rate rhythm, no murmurs, no rubs, no clicks Abd:  soft, positive bowel sounds, no organomegally, no rebound, no guarding Ext:  2 plus pulses, no edema, no cyanosis, no  clubbing Skin:  No rashes no nodules Neuro:  CN II through XII intact, motor grossly intact  EKG - nsr  Assess/Plan: 1. Persistent AF - she is maintaining NSR 2. Cough - on amio, cough is always a concern and I have asked her to undergo an ESR.  3. HTN - her blood pressure is well controlled. We will follow. 4. COPD = her PFTs were not too bad. I would anticipate repeating her PFT's when I see her back in several months if she is still taking amiodarone.  Mikle Bosworth.D.

## 2018-06-29 ENCOUNTER — Ambulatory Visit
Admission: RE | Admit: 2018-06-29 | Discharge: 2018-06-29 | Disposition: A | Discharge status: Home / Self Care | Payer: Medicare Other | Source: Ambulatory Visit | Attending: Internal Medicine | Admitting: Internal Medicine

## 2018-06-29 DIAGNOSIS — R1900 Intra-abdominal and pelvic swelling, mass and lump, unspecified site: Secondary | ICD-10-CM | POA: Diagnosis not present

## 2018-06-29 DIAGNOSIS — R109 Unspecified abdominal pain: Secondary | ICD-10-CM

## 2018-06-29 LAB — HEPATIC FUNCTION PANEL
ALT: 26 IU/L (ref 0–32)
AST: 21 IU/L (ref 0–40)
Albumin: 4.1 g/dL (ref 3.6–4.6)
Alkaline Phosphatase: 59 IU/L (ref 39–117)
Bilirubin Total: 0.4 mg/dL (ref 0.0–1.2)
Bilirubin, Direct: 0.15 mg/dL (ref 0.00–0.40)
Total Protein: 7.2 g/dL (ref 6.0–8.5)

## 2018-06-29 LAB — SEDIMENTATION RATE: Sed Rate: 10 mm/hr (ref 0–40)

## 2018-06-29 LAB — TSH: TSH: 1.54 u[IU]/mL (ref 0.450–4.500)

## 2018-06-29 MED ORDER — IOHEXOL 300 MG/ML  SOLN
100.0000 mL | Freq: Once | INTRAMUSCULAR | Status: AC | PRN
  Administered 2018-06-29: 100 mL via INTRAVENOUS

## 2018-07-16 DIAGNOSIS — R05 Cough: Secondary | ICD-10-CM | POA: Diagnosis not present

## 2018-07-19 ENCOUNTER — Ambulatory Visit: Payer: Medicare Other | Admitting: Internal Medicine

## 2018-07-23 DIAGNOSIS — I4891 Unspecified atrial fibrillation: Secondary | ICD-10-CM | POA: Diagnosis not present

## 2018-07-23 DIAGNOSIS — R1030 Lower abdominal pain, unspecified: Secondary | ICD-10-CM | POA: Diagnosis not present

## 2018-07-23 DIAGNOSIS — M545 Low back pain: Secondary | ICD-10-CM | POA: Diagnosis not present

## 2018-07-23 DIAGNOSIS — J449 Chronic obstructive pulmonary disease, unspecified: Secondary | ICD-10-CM | POA: Diagnosis not present

## 2018-07-23 DIAGNOSIS — R05 Cough: Secondary | ICD-10-CM | POA: Diagnosis not present

## 2018-07-23 DIAGNOSIS — I5032 Chronic diastolic (congestive) heart failure: Secondary | ICD-10-CM | POA: Diagnosis not present

## 2018-07-23 DIAGNOSIS — E039 Hypothyroidism, unspecified: Secondary | ICD-10-CM | POA: Diagnosis not present

## 2018-07-23 DIAGNOSIS — I10 Essential (primary) hypertension: Secondary | ICD-10-CM | POA: Diagnosis not present

## 2018-07-23 DIAGNOSIS — M170 Bilateral primary osteoarthritis of knee: Secondary | ICD-10-CM | POA: Diagnosis not present

## 2018-08-08 ENCOUNTER — Ambulatory Visit: Payer: Medicare Other

## 2018-09-17 DIAGNOSIS — L03115 Cellulitis of right lower limb: Secondary | ICD-10-CM | POA: Diagnosis not present

## 2018-09-26 DIAGNOSIS — L03115 Cellulitis of right lower limb: Secondary | ICD-10-CM | POA: Diagnosis not present

## 2018-09-26 DIAGNOSIS — M545 Low back pain: Secondary | ICD-10-CM | POA: Diagnosis not present

## 2018-09-26 DIAGNOSIS — R1030 Lower abdominal pain, unspecified: Secondary | ICD-10-CM | POA: Diagnosis not present

## 2018-09-28 ENCOUNTER — Other Ambulatory Visit: Payer: Self-pay | Admitting: Cardiology

## 2018-10-04 ENCOUNTER — Telehealth: Payer: Self-pay | Admitting: *Deleted

## 2018-10-04 NOTE — Telephone Encounter (Signed)
..   Virtual Visit Pre-Appointment Phone Call  "(Name), I am calling you today to discuss your upcoming appointment. We are currently trying to limit exposure to the virus that causes COVID-19 by seeing patients at home rather than in the office."  1. "What is the BEST phone number to call the day of the visit?" - include this in appointment notes  2. "Do you have or have access to (through a family member/friend) a smartphone with video capability that we can use for your visit?" a. If yes - list this number in appt notes as "cell" (if different from BEST phone #) and list the appointment type as a VIDEO visit in appointment notes b. If no - list the appointment type as a PHONE visit in appointment notes  3. Confirm consent - "In the setting of the current Covid19 crisis, you are scheduled for a (phone or video) visit with your provider on (date) at (time).  Just as we do with many in-office visits, in order for you to participate in this visit, we must obtain consent.  If you'd like, I can send this to your mychart (if signed up) or email for you to review.  Otherwise, I can obtain your verbal consent now.  All virtual visits are billed to your insurance company just like a normal visit would be.  By agreeing to a virtual visit, we'd like you to understand that the technology does not allow for your provider to perform an examination, and thus may limit your provider's ability to fully assess your condition. If your provider identifies any concerns that need to be evaluated in person, we will make arrangements to do so.  Finally, though the technology is pretty good, we cannot assure that it will always work on either your or our end, and in the setting of a video visit, we may have to convert it to a phone-only visit.  In either situation, we cannot ensure that we have a secure connection.  Are you willing to proceed?" STAFF: Did the patient verbally acknowledge consent to telehealth visit? Document  YES/NO here: YES  4. Advise patient to be prepared - "Two hours prior to your appointment, go ahead and check your blood pressure, pulse, oxygen saturation, and your weight (if you have the equipment to check those) and write them all down. When your visit starts, your provider will ask you for this information. If you have an Apple Watch or Kardia device, please plan to have heart rate information ready on the day of your appointment. Please have a pen and paper handy nearby the day of the visit as well."  5. Give patient instructions for MyChart download to smartphone OR Doximity/Doxy.me as below if video visit (depending on what platform provider is using) EXPLAINED TO PT THAT DR Ladona Ridgel USES DOXEMITY     TELEPHONE CALL NOTE  Natasha [name] has been deemed a candidate for a follow-up tele-health visit to limit community exposure during the Covid-19 pandemic. I spoke with the patient via phone to ensure availability of phone/video source, confirm preferred email & phone number, and discuss instructions and expectations.  I reminded Natasha [name] to be prepared with any vital sign and/or heart rhythm information that could potentially be obtained via home monitoring, at the time of her visit. I reminded Natasha [name] to expect a phone call prior to her visit.  Valrie Hart, CMA 10/04/2018 12:12 PM   INSTRUCTIONS FOR DOWNLOADING THE MYCHART APP TO SMARTPHONE  -  The patient must first make sure to have activated MyChart and know their login information - If Apple, go to [employer] and type in MyChart in the search bar and download the app. If Android, ask patient to go to [employer] Play Store and type in [place] in the search bar and download the app. The app is free but as with any other app downloads, their phone may require them to verify saved payment information or Apple/Android password.  - The patient will need to then log into the app with their MyChart username and password, and select [facility] as their healthcare provider to link the account. When it is time for your visit, go to the MyChart app, find appointments, and click Begin Video Visit. Be sure to Select Allow for your device to access the Microphone and Camera for your visit. You will then be connected, and your provider will be with you shortly.  **If they have any issues connecting, or need assistance please contact MyChart service desk [phone])**  **If using a computer, in order to ensure the best quality for their visit they will need to use either of the following Internet Browsers: [employer] Edge, or Google Chrome**  IF USING DOXIMITY or DOXY.ME - The patient will receive a link just prior to their visit by text. PATIENT AWARE     FULL LENGTH CONSENT FOR TELE-HEALTH VISIT   I hereby voluntarily request, consent and authorize [facility] and its employed or contracted physicians, Producer, television/film/videophysician assistants, nurse practitioners or other licensed health care professionals (the Practitioner), to provide me with telemedicine health care services (the "Services") as deemed necessary by the treating Practitioner. I acknowledge and consent to receive the Services by the Practitioner via telemedicine. I understand that the telemedicine visit will involve communicating with the Practitioner through live audiovisual communication technology and the disclosure of certain medical information by electronic transmission. I acknowledge that I have been given the opportunity to request an in-person assessment or other available alternative prior to the telemedicine visit and am voluntarily participating in the telemedicine visit.  I understand that I have the right to withhold or withdraw my consent to the use of telemedicine in the course of my care at any time, without affecting my right to future care or treatment, and that the Practitioner or I may terminate the telemedicine visit at any time. I understand that I  have the right to inspect all information obtained and/or recorded in the course of the telemedicine visit and may receive copies of available information for a reasonable fee.  I understand that some of the potential risks of receiving the Services via telemedicine include:  Marland Kitchen. Delay or interruption in medical evaluation due to technological equipment failure or disruption; . Information transmitted may not be sufficient (e.g. poor resolution of images) to allow for appropriate medical decision making by the Practitioner; and/or  . In rare instances, security protocols could fail, causing a breach of personal health information.  Furthermore, I acknowledge that it is my responsibility to provide information about my medical history, conditions and care that is complete and accurate to the best of my ability. I acknowledge that Practitioner's advice, recommendations, and/or decision may be based on factors not within their control, such as incomplete or inaccurate data provided by me or distortions of diagnostic images or specimens that may result from electronic transmissions. I understand that the practice of medicine is not an exact science and that Practitioner makes no warranties or guarantees  regarding treatment outcomes. I acknowledge that I will receive a copy of this consent concurrently upon execution via email to the email address I last provided but may also request a printed copy by calling the office of [place].    I understand that my insurance will be billed for this visit.   I have read or had this consent read to me. . I understand the contents of this consent, which adequately explains the benefits and risks of the Services being provided via telemedicine.  . I have been provided ample opportunity to ask questions regarding this consent and the Services and have had my questions answered to my satisfaction. . I give my informed consent for the services to be provided through the  use of telemedicine in my medical care  By participating in this telemedicine visit I agree to the above.

## 2018-10-04 NOTE — Telephone Encounter (Signed)
FFM:[id number] pt to discuss visit scheduled for next week and pt is c/o mild LEE of the rt foot/leg. She says that it is also red and sore to the touch. Left side not so much. This was evaluated by her PCP via video visit, he thought it could be cellulitis and she was prescribed sulfa abx and instructed to wear compression stockings. She was re-evaluated after completing the abx but still having the same sx but PCP did not want to prescribe another round of abx. She is not sure if this could be cardiac related, medication related or simply due to being more sedentary during the pandemic.

## 2018-10-10 DIAGNOSIS — H04123 Dry eye syndrome of bilateral lacrimal glands: Secondary | ICD-10-CM | POA: Diagnosis not present

## 2018-10-11 ENCOUNTER — Telehealth (INDEPENDENT_AMBULATORY_CARE_PROVIDER_SITE_OTHER): Payer: Medicare Other | Admitting: Internal Medicine

## 2018-10-11 ENCOUNTER — Other Ambulatory Visit: Payer: Self-pay

## 2018-10-11 DIAGNOSIS — I4819 Other persistent atrial fibrillation: Secondary | ICD-10-CM

## 2018-10-11 NOTE — Progress Notes (Signed)
Electrophysiology TeleHealth Note   Due to national recommendations of social distancing due to COVID 19, an audio/video telehealth visit is felt to be most appropriate for this patient at this time.  See MyChart message from today for the patient's consent to telehealth for [facility].   Date:  10/11/2018   ID:  Natasha [name], DOB [birth year], MRN [id number]  Location: patient's home  Provider location: [address] [place]  Evaluation Performed: Follow-up visit  PCP:  Marden Noble, MD  Cardiologist:  Armanda Magic, MD  Electrophysiologist:  Dr Ladona Ridgel  Chief Complaint:  "I am more sob and my feet are swelling."  History of Present Illness:    Natasha [name] is a 85 y.o. female who presents via audio/video conferencing for a telehealth visit today.  She is a pleasant 85 yo woman with atrial fib and atrial tachycardia, s/p 2 catheter ablations. She had recurrent fib and was placed on amiodarone. She has continued to have neuro side effects, leg swelling, reduced balance and sob. The patient is otherwise without complaint today.  The patient denies symptoms of fevers, chills, cough, or new SOB worrisome for COVID 19.  Past Medical History:  Diagnosis Date  . Chronic diastolic heart failure (HCC) 04/22/2015  . Complication of anesthesia   . Diverticular disease   . DJD (degenerative joint disease)   . Dysphagia   . GERD (gastroesophageal reflux disease)   . Hiatal hernia   . Hypertension   . Hypothyroidism   . Lower leg edema   . PAF (paroxysmal atrial fibrillation) (HCC)    s/p ablation x several times now with occasional episodes of PAF.   She has failed sotolol/dofetilide/flecainide/propafenone/Amio and dronedarone either with not controlling her PAF or having side effects  . PONV (postoperative nausea and vomiting)   . Restless leg syndrome   . Seasonal allergies     Past Surgical History:  Procedure Laterality Date  . ABDOMINAL HYSTERECTOMY    .  APPENDECTOMY    . ATRIAL FIBRILLATION ABLATION  09-06-13   2'14 -[facility]  . BALLOON DILATION  03/03/2011   Procedure: BALLOON DILATION;  Surgeon: Charolett Bumpers, MD;  Location: [facility] ENDOSCOPY;  Service: Endoscopy;  Laterality: N/A;  . bladder polyps    . BREAST EXCISIONAL BIOPSY Left   . BREAST SURGERY     left lumpectomy  . CARDIOVERSION     x3  . CARDIOVERSION N/A 12/30/2014   Procedure: CARDIOVERSION;  Surgeon: Lewayne Bunting, MD;  Location: [facility] ENDOSCOPY;  Service: Cardiovascular;  Laterality: N/A;  . CARDIOVERSION N/A 04/23/2015   Procedure: CARDIOVERSION;  Surgeon: Thurmon Fair, MD;  Location: [facility] ENDOSCOPY;  Service: Cardiovascular;  Laterality: N/A;  . CATARACT EXTRACTION Left   . COLONOSCOPY WITH PROPOFOL N/A 09/24/2013   Procedure: COLONOSCOPY WITH PROPOFOL;  Surgeon: Charolett Bumpers, MD;  Location: [facility] ENDOSCOPY;  Service: Endoscopy;  Laterality: N/A;  . ESOPHAGOGASTRODUODENOSCOPY  03/03/2011   Procedure: ESOPHAGOGASTRODUODENOSCOPY (EGD);  Surgeon: Charolett Bumpers, MD;  Location: [facility] ENDOS[facility];  Service: Endoscopy;  Laterality: N/A;  . EYE SURGERY     cataracat  . RHINOPLASTY    . RIGHT HEART CATHETERIZATION N/A 07/11/2014   Procedure: RIGHT HEART CATH;  Surgeon: Laurey Morale, MD;  Location: [facility] CATH LAB;  Service: Cardiovascular;  Laterality: N/A;  . ROTATOR CUFF REPAIR    . THYROIDECTOMY  1973  . TONSILLECTOMY  1942  . TUBAL LIGATION      Current Outpatient Medications  Medication Sig Dispense Refill  . amiodarone (PACERONE) 200 MG tablet Take 1 tablet (200 mg total) by mouth daily. None on Sundays 90 tablet 3  . Cholecalciferol (VITAMIN D) 2000 UNITS CAPS Take 2,000 Units by mouth daily. Reported on 04/22/2015    . ELIQUIS 5 MG TABS tablet TAKE 1 TABLET BY MOUTH 2 TIMES DAILY. 60 tablet 6  . esomeprazole (NEXIUM) 20 MG capsule Take 20 mg by mouth every 3 (three) days.     Marland Kitchen estradiol (ESTRACE) 1 MG tablet Take 1 mg by mouth at bedtime.     . furosemide (LASIX) 40  MG tablet TAKE 1 TABLET BY MOUTH DAILY. 90 tablet 2  . levothyroxine (SYNTHROID, LEVOTHROID) 125 MCG tablet Take 125 mcg by mouth daily before breakfast. BRAND ONLY    . LORazepam (ATIVAN) 1 MG tablet Take 0.5-1 mg by mouth at bedtime as needed for sleep. For sleep    . Magnesium 500 MG CAPS Take 500 mg by mouth daily.    . nebivolol (BYSTOLIC) 5 MG tablet Take 1 tablet (5 mg total) by mouth daily. 90 tablet 2  . potassium chloride (K-DUR) 10 MEQ tablet TAKE 1 TABLET BY MOUTH DAILY 30 tablet 11  . Probiotic Product (FLORAJEN3 PO) Take by mouth.    . verapamil (CALAN-SR) 120 MG CR tablet Take 120 mg by mouth daily as needed (for rapid heart rate).      No current facility-administered medications for this visit.     Allergies:   Codeine, Penicillins, Tetracycline, Tramadol, Digoxin, Digoxin and related, Erythromycin, Other, Sulfa antibiotics, Tetracyclines & related, Diltiazem, Diltiazem hcl, and Sulfites   Social History:  The patient  reports that she has never smoked. She has never used smokeless tobacco. She reports current alcohol use. She reports that she does not use drugs.   Family History:  The patient's family history includes CVA in her father; Cancer in her brother; Heart failure in her father; Hypertension in her sister; Lung cancer in her father; Sudden death in her brother; Transient ischemic attack in her mother.   ROS:  Please see the history of present illness.   All other systems are personally reviewed and negative.    Exam:    Vital Signs:  BP - 126/74, P - 43  Well appearing, alert and conversant, regular work of breathing,  good skin color Eyes- anicteric, neuro- grossly intact, skin- no apparent rash or lesions or cyanosis, mouth- oral mucosa is pink   Labs/Other Tests and Data Reviewed:    Recent Labs: 01/11/2018: BUN 15; Creatinine, Ser 0.91; Hemoglobin 14.1; Platelets 234; Potassium 4.0; Sodium 139 01/17/2018: NT-Pro BNP 244 06/28/2018: ALT 26; TSH 1.540    Wt Readings from Last 3 Encounters:  06/28/18 209 lb (94.8 kg)  04/03/18 209 lb 12.8 oz (95.2 kg)  02/16/18 203 lb (92.1 kg)     Other studies personally reviewed:   ASSESSMENT & PLAN:    1.  Atrial fib/tachy - she has had no significant break throughs. Because of side effects, she will reduce her dose of amiodarone to 100 mg daily.  2. Chronic diastolic heart failure - I explained that it would be very unlikely for amio to be causing this. I encouraged a low sodium diet and continue lasix. She does a lot of carry out and I suspect she is getting too much salt.  3. COVID 19 screen The patient denies symptoms of COVID 19 at this time.  The importance of social distancing was discussed today.  Follow-up:  8 weeks Next remote: n/a  Current medicines are reviewed at length with the patient today.   The patient does not have concerns regarding her medicines.  The following changes were made today:  none  Labs/ tests ordered today include: none No orders of the defined types were placed in this encounter.    Patient Risk:  after full review of this patients clinical status, I feel that they are at moderate risk at this time.  Today, I have spent 25 minutes with the patient with telehealth technology discussing all of the above .    Signed, Lewayne BuntingGregg Oleg Oleson, MD  10/11/2018 9:37 AM     [facility] [address] [place] [address] [phone] (office) [phone] (fax)

## 2018-10-15 ENCOUNTER — Ambulatory Visit
Admission: RE | Admit: 2018-10-15 | Discharge: 2018-10-15 | Disposition: A | Discharge status: Home / Self Care | Payer: Medicare Other | Source: Ambulatory Visit | Attending: Internal Medicine | Admitting: Internal Medicine

## 2018-10-15 ENCOUNTER — Other Ambulatory Visit: Payer: Self-pay

## 2018-10-15 DIAGNOSIS — Z1231 Encounter for screening mammogram for malignant neoplasm of breast: Secondary | ICD-10-CM | POA: Diagnosis not present

## 2018-10-15 MED ORDER — AMIODARONE HCL 200 MG PO TABS: 100.0000 mg | ORAL_TABLET | Freq: Every day | ORAL | 3 refills | Status: DC

## 2018-10-15 NOTE — Addendum Note (Signed)
Addended by: Willeen Cass A on: 10/15/2018 04:30 PM   Modules accepted: Orders

## 2018-10-23 DIAGNOSIS — R600 Localized edema: Secondary | ICD-10-CM | POA: Diagnosis not present

## 2018-11-09 ENCOUNTER — Telehealth: Payer: Self-pay

## 2018-11-09 ENCOUNTER — Telehealth: Payer: Self-pay | Admitting: *Deleted

## 2018-11-09 NOTE — Telephone Encounter (Signed)

## 2018-11-09 NOTE — Telephone Encounter (Signed)
   TELEPHONE CALL NOTE  This patient has been deemed a candidate for follow-up tele-health visit to limit community exposure during the Covid-19 pandemic. I spoke with the patient via phone to discuss instructions. This has been outlined on the patient's AVS (dotphrase: hcevisitinfo). The patient was advised to review the section on consent for treatment as well. The patient will receive a phone call 2-3 days prior to their E-Visit at which time consent will be verbally confirmed.   A Virtual Office Visit appointment type has been scheduled for Laiklynn with TT, with "VIDEO" or "TELEPHONE" in the appointment notes - patient prefers VIDEO type.   Marolyn Hammock, [place] 11/09/2018 1:22 PM

## 2018-11-12 ENCOUNTER — Other Ambulatory Visit: Payer: Self-pay

## 2018-11-12 ENCOUNTER — Telehealth (INDEPENDENT_AMBULATORY_CARE_PROVIDER_SITE_OTHER): Payer: Medicare Other | Admitting: Cardiology

## 2018-11-12 ENCOUNTER — Encounter: Payer: Self-pay | Admitting: Cardiology

## 2018-11-12 DIAGNOSIS — I5032 Chronic diastolic (congestive) heart failure: Secondary | ICD-10-CM

## 2018-11-12 DIAGNOSIS — I272 Pulmonary hypertension, unspecified: Secondary | ICD-10-CM | POA: Diagnosis not present

## 2018-11-12 DIAGNOSIS — I10 Essential (primary) hypertension: Secondary | ICD-10-CM | POA: Diagnosis not present

## 2018-11-12 DIAGNOSIS — I4819 Other persistent atrial fibrillation: Secondary | ICD-10-CM

## 2018-11-12 NOTE — Progress Notes (Signed)
Virtual Visit via Video Note   This visit type was conducted due to national recommendations for restrictions regarding the COVID-19 Pandemic (e.g. social distancing) in an effort to limit this patient's exposure and mitigate transmission in our community.  Due to her co-morbid illnesses, this patient is at least at moderate risk for complications without adequate follow up.  This format is felt to be most appropriate for this patient at this time.  All issues noted in this document were discussed and addressed.  A limited physical exam was performed with this format.  Please refer to the patient's chart for her consent to telehealth for [facility].  Evaluation Performed:  Follow-up visit  This visit type was conducted due to national recommendations for restrictions regarding the COVID-19 Pandemic (e.g. social distancing).  This format is felt to be most appropriate for this patient at this time.  All issues noted in this document were discussed and addressed.  No physical exam was performed (except for noted visual exam findings with Video Visits).  Please refer to the patient's chart (MyChart message for video visits and phone note for telephone visits) for the patient's consent to telehealth [facility].  Date:  11/12/2018   ID:  Natasha MurrainJane B Sanchez, DOB 02/20/1933, MRN [id number]  Patient Location:  Home  [place]   [place]  PCP:  Marden NobleGates, Robert, MD  Cardiologist:  Armanda Magicraci Turner, MD  Electrophysiologist:  None   Chief Complaint:  Atrial fibrillation  History of Present Illness:    Natasha [name] is a 85 y.o. female who presents via audio/video conferencing for a telehealth visit today.    Natasha MurrainJane B Cobey is a 85 y.o. female with a hx of persistent [facility] and multiple antiarrythmic intolerances(Multaq , Rythmol, flecaide, tikosyn, and pt refused amiodarone) , HTN, hypothyroidism, mildpulmonary hypertension(3436mmHg)and  chronic diastolic dysfunction.   [facility] as in our office by Dr. Ladona Ridgelaylor with EP who recommended3 options-->continue current course and prn bystolic 5 mg, begin amiodarone (which she does not wish to do) and AV node ablaltion with PPM. She chose tocontinue current meds with  verapmil, bystolic and prn bystolic. She was seen back by Dr. Ladona Ridgelaylor and was having fatigue that was felt to possibly be due to her afib meds and it was recommended that her evening dose of Bystolic be stopped but she decided not to stop it.    She was seen back by Dr. Ladona Ridgelaylor last month and had not had any problems with palpitations but due to side effects her Amio was decreased to 100mg  daily.  She has chronic diastolic CHF and Dr. Ladona Ridgelaylor explained to her that she was getting too much sodium in her diet.     She is here today for followup and is doing well.  She denies any chest pain or pressure,  PND, orthopnea, dizziness or syncope. She has chronic LE edema.  She has chronic SOB felt secondary to deconditioning and obesity.  She has a mild tremor in her hand related to Amio. Rarely she will have a slight skip in her heart. She is compliant with her meds and is tolerating meds with no SE.    The patient does not have symptoms concerning for COVID-19 infection (fever, chills, cough, or new shortness of breath).    Prior CV studies:   The following studies were reviewed today:  none  Past Medical History:  Diagnosis Date  . Chronic diastolic  heart failure (HCC) 04/22/2015  . Complication of anesthesia   . Diverticular disease   . DJD (degenerative joint disease)   . Dysphagia   . GERD (gastroesophageal reflux disease)   . Hiatal hernia   . Hypertension   . Hypothyroidism   . Lower leg edema   . PAF (paroxysmal atrial fibrillation) (HCC)    s/p ablation x several times now with occasional episodes of PAF.   She has failed sotolol/dofetilide/flecainide/propafenone/Amio and dronedarone  either with not controlling her PAF or having side effects  . PONV (postoperative nausea and vomiting)   . Restless leg syndrome   . Seasonal allergies    Past Surgical History:  Procedure Laterality Date  . ABDOMINAL HYSTERECTOMY    . APPENDECTOMY    . ATRIAL FIBRILLATION ABLATION  09-06-13   2'14 -[facility]"  . BALLOON DILATION  03/03/2011   Procedure: BALLOON DILATION;  Surgeon: Charolett Bumpers, MD;  Location: [facility];  Service: Endoscopy;  Laterality: N/A;  . bladder polyps    . BREAST EXCISIONAL BIOPSY Left   . BREAST SURGERY     left lumpectomy  . CARDIOVERSION     x3  . CARDIOVERSION N/A 12/30/2014   Procedure: CARDIOVERSION;  Surgeon: Lewayne Bunting, MD;  Location: [facility] ENDOSCOPY;  Service: Cardiovascular;  Laterality: N/A;  . CARDIOVERSION N/A 04/23/2015   Procedure: CARDIOVERSION;  Surgeon: Thurmon Fair, MD;  Location: [facility] ENDOSCOPY;  Service: Cardiovascular;  Laterality: N/A;  . CATARACT EXTRACTION Left   . COLONOSCOPY WITH PROPOFOL N/A 09/24/2013   Procedure: COLONOSCOPY WITH PROPOFOL;  Surgeon: Charolett Bumpers, MD;  Location: [facility] END[facility]COPY;  Service: Endoscopy;  Laterality: N/A;  . ESOPHAGOGASTRODUODENOSCOPY  03/03/2011   Procedure: ESOPHAGOGASTRODUODENOSCOPY (EGD);  Surgeon: Charolett Bumpers, MD;  Location: [facility];  Service: Endoscopy;  Laterality: N/A;  . EYE SURGERY     cataracat  . RHINOPLASTY    . RIGHT HEART CATHETERIZATION N/A 07/11/2014   Procedure: RIGHT HEART CATH;  Surgeon: Laurey Morale, MD;  Location: [facility] CATH LAB;  Service: Cardiovascular;  Laterality: N/A;  . ROTATOR CUFF REPAIR    . THYROIDECTOMY  1973  . TONSILLECTOMY  1942  . TUBAL LIGATION       Current Meds  Medication Sig  . amiodarone (PACERONE) 200 MG tablet Take 0.5 tablets (100 mg total) by mouth daily.  Marland Kitchen ELIQUIS 5 MG TABS tablet TAKE 1 TABLET BY MOUTH 2 TIMES DAILY.  Marland Kitchen esomeprazole (NEXIUM) 20 MG capsule Take 20 mg by mouth every 3 (three) days.   . furosemide (LASIX) 40  MG tablet TAKE 1 TABLET BY MOUTH DAILY.  Marland Kitchen levothyroxine (SYNTHROID, LEVOTHROID) 125 MCG tablet Take 125 mcg by mouth daily before breakfast. BRAND ONLY  . LORazepam (ATIVAN) 1 MG tablet Take 0.5-1 mg by mouth at bedtime as needed for sleep. For sleep  . Magnesium 500 MG CAPS Take 500 mg by mouth daily.  . potassium chloride (K-DUR) 10 MEQ tablet TAKE 1 TABLET BY MOUTH DAILY  . verapamil (CALAN-SR) 120 MG CR tablet Take 120 mg by mouth daily as needed (for rapid heart rate).   . Vitamin D, Cholecalciferol, 50 MCG (2000 UT) CAPS Take 1 capsule by mouth daily.  . [DISCONTINUED] Cholecalciferol (VITAMIN D) 2000 UNITS CAPS Take 2,000 Units by mouth daily. Reported on 04/22/2015     Allergies:   Codeine, Penicillins, Tetracycline, Tramadol, Digoxin, Digoxin and related, Erythromycin, Other, Sulfa antibiotics, Tetracyclines & related, Diltiazem, Diltiazem hcl, and Sulfites   Social History  Tobacco Use  . Smoking status: Never Smoker  . Smokeless tobacco: Never Used  Substance Use Topics  . Alcohol use: Yes    Alcohol/week: 0.0 standard drinks    Comment: rare wine  . Drug use: No     Family Hx: The patient's family history includes CVA in her father; Cancer in her brother; Heart failure in her father; Hypertension in her sister; Lung cancer in her father; Sudden death in her brother; Transient ischemic attack in her mother. There is no history of Anesthesia problems, Hypotension, Malignant hyperthermia, Pseudochol deficiency, or Heart attack.  ROS:   Please see the history of present illness.     All other systems reviewed and are negative.   Labs/Other Tests and Data Reviewed:    Recent Labs: 01/11/2018: BUN 15; Creatinine, Ser 0.91; Hemoglobin 14.1; Platelets 234; Potassium 4.0; Sodium 139 01/17/2018: NT-Pro BNP 244 06/28/2018: ALT 26; TSH 1.540   Recent Lipid Panel No results found for: CHOL, TRIG, HDL, CHOLHDL, LDLCALC, LDLDIRECT  Wt Readings from Last 3 Encounters:  11/12/18  198 lb (89.8 kg)  06/28/18 209 lb (94.8 kg)  04/03/18 209 lb 12.8 oz (95.2 kg)     Objective:    Vital Signs:  BP 129/83   Pulse 73   Temp (!) 97.4 F (36.3 C)   Ht 5\' 9"  (1.753 m)   Wt 198 lb (89.8 kg)   BMI 29.24 kg/m    CONSTITUTIONAL:  Well nourished, well developed female in no acute distress.  EYES: anicteric MOUTH: oral mucosa is pink RESPIRATORY: Normal respiratory effort, symmetric expansion CARDIOVASCULAR: No peripheral edema SKIN: No rash, lesions or ulcers MUSCULOSKELETAL: no digital cyanosis NEURO: Cranial Nerves II-XII grossly intact, moves all extremities PSYCH: Intact judgement and insight.  A&O x 3, Mood/affect appropriate   ASSESSMENT & PLAN:    1.  Chronic diastolic CHF -she has problems with dietary indiscretion with Na that cause CHF flares -continue on Lasix 40mg  daily -she is using 1/2&1/2 salt and I have stressed that this is what is causing her LE edema.  I encouraged her to use no added salt and avoid high sodium foods.  She is not very happy about this and is not sure she can cut out salt.  2.  Hypertension -her BP is controlled.   -continue on Bystolic 5mg  daily.  3.  Persistent atrial fibrillation -her palpitations have been stable -Amio decreased at last EP visit due to side effects -no bleeding on DOAC -continue on Bystolic, 5mg  daily, Amio 100mg  daily and Eliquis 5mg  BID -Creatinine was normal at 0.85 earlier this month and Hbg 14.7 in Feb 2020  4.  Pulmonary Hypertension -PAP normal on echo 12/2017  COVID-19 Education: The signs and symptoms of COVID-19 were discussed with the patient and how to seek care for testing (follow up with PCP or arrange E-visit).  The importance of social distancing was discussed today.  Patient Risk:   After full review of this patient's clinical status, I feel that they are at least moderate risk at this time.  Time:   Today, I have spent 20 minutes on tele medicine discussing medical problems  including afib, HTN, pulmonary HTN.  We also reviewed the symptoms of COVID 19 and the ways to protect against contracting the virus with telehealth technology.  I spent an additional 5 minutes reviewing patient's chart including 2D echo, labs.  Medication Adjustments/Labs and Tests Ordered: Current medicines are reviewed at length with the patient today.  Concerns regarding medicines  are outlined above.  Tests Ordered: No orders of the defined types were placed in this encounter.  Medication Changes: No orders of the defined types were placed in this encounter.   Disposition:  Follow up 1 year  Signed, Fransico Him, MD  11/12/2018 2:52 PM    [place]

## 2018-11-12 NOTE — Patient Instructions (Signed)
Medication Instructions:  Your provider recommends that you continue on your current medications as directed. Please refer to the Current Medication list given to you today.   If you need a refill on your cardiac medications before your next appointment, please call your pharmacy.   Lab work: None If you have labs (blood work) drawn today and your tests are completely normal, you will receive your results only by:  [place] (if you have MyChart) OR  A paper copy in the mail If you have any lab test that is abnormal or we need to change your treatment, we will call you to review the results.  Testing/Procedures: None  Follow-Up: At [facility], you and your health needs are our priority.  As part of our continuing mission to provide you with exceptional heart care, we have created designated Provider Care Teams.  These Care Teams include your primary Cardiologist (physician) and Advanced Practice Providers (APPs -  Physician Assistants and Nurse Practitioners) who all work together to provide you with the care you need, when you need it. You will need a follow up appointment in 12 months.  Please call our office 2 months in advance to schedule this appointment.  You may see Fransico Him, MD or one of the following Advanced Practice Providers on your designated Care Team:   Lyda Jester, PA-C Dayna Dunn, PA-C  Ermalinda Barrios, PA-C  Any Other Special Instructions Will Be Listed Below (If Applicable).  Low-Sodium Eating Plan Sodium, which is an element that makes up salt, helps you maintain a healthy balance of fluids in your body. Too much sodium can increase your blood pressure and cause fluid and waste to be held in your body. Your health care provider or dietitian may recommend following this plan if you have high blood pressure (hypertension), kidney disease, liver disease, or heart failure. Eating less sodium can help lower your blood pressure, reduce swelling, and protect  your heart, liver, and kidneys. What are tips for following this plan? General guidelines  Most people on this plan should limit their sodium intake to 1,500-2,000 mg (milligrams) of sodium each day. Reading food labels   The Nutrition Facts label lists the amount of sodium in one serving of the food. If you eat more than one serving, you must multiply the listed amount of sodium by the number of servings.  Choose foods with less than 140 mg of sodium per serving.  Avoid foods with 300 mg of sodium or more per serving. Shopping  Look for lower-sodium products, often labeled as "low-sodium" or "no salt added."  Always check the sodium content even if foods are labeled as "unsalted" or "no salt added".  Buy fresh foods. ? Avoid canned foods and premade or frozen meals. ? Avoid canned, cured, or processed meats  Buy breads that have less than 80 mg of sodium per slice. Cooking  Eat more home-cooked food and less restaurant, buffet, and fast food.  Avoid adding salt when cooking. Use salt-free seasonings or herbs instead of table salt or sea salt. Check with your health care provider or pharmacist before using salt substitutes.  Cook with plant-based oils, such as canola, sunflower, or olive oil. Meal planning  When eating at a restaurant, ask that your food be prepared with less salt or no salt, if possible.  Avoid foods that contain MSG (monosodium glutamate). MSG is sometimes added to [place] food, bouillon, and some canned foods. What foods are recommended? The items listed may not be  a complete list. Talk with your dietitian about what dietary choices are best for you. Grains Low-sodium cereals, including oats, puffed wheat and rice, and shredded wheat. Low-sodium crackers. Unsalted rice. Unsalted pasta. Low-sodium bread. Whole-grain breads and whole-grain pasta. Vegetables Fresh or frozen vegetables. "No salt added" canned vegetables. "No salt added" tomato sauce and paste.  Low-sodium or reduced-sodium tomato and vegetable juice. Fruits Fresh, frozen, or canned fruit. Fruit juice. Meats and other protein foods Fresh or frozen (no salt added) meat, poultry, seafood, and fish. Low-sodium canned tuna and salmon. Unsalted nuts. Dried peas, beans, and lentils without added salt. Unsalted canned beans. Eggs. Unsalted nut butters. Dairy Milk. Soy milk. Cheese that is naturally low in sodium, such as ricotta cheese, fresh mozzarella, or Swiss cheese Low-sodium or reduced-sodium cheese. Cream cheese. Yogurt. Fats and oils Unsalted butter. Unsalted margarine with no trans fat. Vegetable oils such as canola or olive oils. Seasonings and other foods Fresh and dried herbs and spices. Salt-free seasonings. Low-sodium mustard and ketchup. Sodium-free salad dressing. Sodium-free light mayonnaise. Fresh or refrigerated horseradish. Lemon juice. Vinegar. Homemade, reduced-sodium, or low-sodium soups. Unsalted popcorn and pretzels. Low-salt or salt-free chips. What foods are not recommended? The items listed may not be a complete list. Talk with your dietitian about what dietary choices are best for you. Grains Instant hot cereals. Bread stuffing, pancake, and biscuit mixes. Croutons. Seasoned rice or pasta mixes. Noodle soup cups. Boxed or frozen macaroni and cheese. Regular salted crackers. Self-rising flour. Vegetables Sauerkraut, pickled vegetables, and relishes. Olives. [place] fries. Onion rings. Regular canned vegetables (not low-sodium or reduced-sodium). Regular canned tomato sauce and paste (not low-sodium or reduced-sodium). Regular tomato and vegetable juice (not low-sodium or reduced-sodium). Frozen vegetables in sauces. Meats and other protein foods Meat or fish that is salted, canned, smoked, spiced, or pickled. Bacon, ham, sausage, hotdogs, corned beef, chipped beef, packaged lunch meats, salt pork, jerky, pickled herring, anchovies, regular canned tuna, sardines, salted  nuts. Dairy Processed cheese and cheese spreads. Cheese curds. Blue cheese. Feta cheese. String cheese. Regular cottage cheese. Buttermilk. Canned milk. Fats and oils Salted butter. Regular margarine. Ghee. Bacon fat. Seasonings and other foods Onion salt, garlic salt, seasoned salt, table salt, and sea salt. Canned and packaged gravies. Worcestershire sauce. Tartar sauce. Barbecue sauce. Teriyaki sauce. Soy sauce, including reduced-sodium. Steak sauce. Fish sauce. Oyster sauce. Cocktail sauce. Horseradish that you find on the shelf. Regular ketchup and mustard. Meat flavorings and tenderizers. Bouillon cubes. Hot sauce and Tabasco sauce. Premade or packaged marinades. Premade or packaged taco seasonings. Relishes. Regular salad dressings. Salsa. Potato and tortilla chips. Corn chips and puffs. Salted popcorn and pretzels. Canned or dried soups. Pizza. Frozen entrees and pot pies. Summary  Eating less sodium can help lower your blood pressure, reduce swelling, and protect your heart, liver, and kidneys.  Most people on this plan should limit their sodium intake to 1,500-2,000 mg (milligrams) of sodium each day.  Canned, boxed, and frozen foods are high in sodium. Restaurant foods, fast foods, and pizza are also very high in sodium. You also get sodium by adding salt to food.  Try to cook at home, eat more fresh fruits and vegetables, and eat less fast food, canned, processed, or prepared foods. This information is not intended to replace advice given to you by your health care provider. Make sure you discuss any questions you have with your health care provider. Document Released: 09/24/2001 Document Revised: 03/17/2017 Document Reviewed: 03/28/2016 Elsevier Patient Education  2020 [employer].

## 2018-11-22 DIAGNOSIS — R600 Localized edema: Secondary | ICD-10-CM | POA: Diagnosis not present

## 2018-11-22 DIAGNOSIS — G2581 Restless legs syndrome: Secondary | ICD-10-CM | POA: Diagnosis not present

## 2018-11-22 DIAGNOSIS — N951 Menopausal and female climacteric states: Secondary | ICD-10-CM | POA: Diagnosis not present

## 2018-11-23 ENCOUNTER — Other Ambulatory Visit: Payer: Self-pay | Admitting: Cardiology

## 2018-12-18 ENCOUNTER — Encounter: Payer: Self-pay | Admitting: Internal Medicine

## 2018-12-18 ENCOUNTER — Ambulatory Visit (INDEPENDENT_AMBULATORY_CARE_PROVIDER_SITE_OTHER): Payer: Medicare Other | Admitting: Internal Medicine

## 2018-12-18 ENCOUNTER — Other Ambulatory Visit: Payer: Self-pay

## 2018-12-18 VITALS — BP 146/88 | HR 71 | Ht 69.0 in | Wt 205.0 lb

## 2018-12-18 DIAGNOSIS — I5032 Chronic diastolic (congestive) heart failure: Secondary | ICD-10-CM

## 2018-12-18 DIAGNOSIS — I4819 Other persistent atrial fibrillation: Secondary | ICD-10-CM

## 2018-12-18 LAB — TSH: TSH: 14.7 u[IU]/mL — ABNORMAL HIGH (ref 0.450–4.500)

## 2018-12-18 LAB — CK: Total CK: 68 U/L (ref 26–161)

## 2018-12-18 NOTE — Patient Instructions (Addendum)
Medication Instructions:  Your physician recommends that you continue on your current medications as directed. Please refer to the Current Medication list given to you today.  Labwork: You will get lab work today:  CK and TSH  Testing/Procedures: None ordered.  Follow-Up: Your physician wants you to follow-up in: 6 months with Dr. Lovena Le.   You will receive a reminder letter in the mail two months in advance. If you don't receive a letter, please call our office to schedule the follow-up appointment.   Any Other Special Instructions Will Be Listed Below (If Applicable).  If you need a refill on your cardiac medications before your next appointment, please call your pharmacy.

## 2018-12-18 NOTE — Progress Notes (Signed)
HPI [name] returns today for followup of her atrial fib and flutter, chronic diastolic heart failure, HTN, and chronic dyspnea. She c/o malaise but denies palpitations. She describes some muscle aches. She has not had syncope. No chest pain or sob.  Allergies  Allergen Reactions  . Codeine Nausea And Vomiting and Nausea Only  . Penicillins Hives    Has patient had a PCN reaction causing immediate rash, facial/tongue/throat swelling, SOB or lightheadedness with hypotension: No Has patient had a PCN reaction causing severe rash involving mucus membranes or skin necrosis: No Has patient had a PCN reaction that required hospitalization No Has patient had a PCN reaction occurring within the last 10 years: No If all of the above answers are "NO", then may proceed with Cephalosporin use.  . Tetracycline Rash  . Tramadol Nausea Only  . Digoxin Other (See Comments)    Flushed neck and face  . Digoxin And Related Other (See Comments)    ' Makes my face blood red"  . Erythromycin Other (See Comments) and Hives    Flushing on face  . Other Other (See Comments)    ECG leads cause skin irritation. ECG leads cause skin irritation.  . Sulfa Antibiotics Other (See Comments)    PRESERVATIVE OF SOME FOODS- MAKES FLUSHES TO FACE AND NECK  . Tetracyclines & Related Other (See Comments)    "Sores on legs"  . Diltiazem Rash and Other (See Comments)    Rash with long acting form  . Diltiazem Hcl Rash    Rash with long acting form  . Sulfites Rash    PRESERVATIVE OF SOME FOODS- MAKES FLUSHES TO FACE AND NECK PRESERVATIVE OF SOME FOODS- MAKES FLUSHES TO FACE AND NECK     Current Outpatient Medications  Medication Sig Dispense Refill  . amiodarone (PACERONE) 200 MG tablet Take 0.5 tablets (100 mg total) by mouth daily. 45 tablet 3  . ELIQUIS 5 MG TABS tablet TAKE 1 TABLET BY MOUTH 2 TIMES DAILY. 60 tablet 6  . esomeprazole (NEXIUM) 20 MG capsule Take 20 mg by mouth every 3 (three) days.      Marland Kitchen. estradiol (VIVELLE-DOT) 0.025 MG/24HR Place 1 patch onto the skin daily.    . furosemide (LASIX) 40 MG tablet TAKE 1 TABLET BY MOUTH DAILY. 90 tablet 2  . levothyroxine (SYNTHROID, LEVOTHROID) 125 MCG tablet Take 125 mcg by mouth daily before breakfast. BRAND ONLY    . LORazepam (ATIVAN) 1 MG tablet Take 0.5-1 mg by mouth at bedtime as needed for sleep. For sleep    . Magnesium 500 MG CAPS Take 500 mg by mouth daily.    . potassium chloride (K-DUR) 10 MEQ tablet TAKE 1 TABLET BY MOUTH DAILY 30 tablet 11  . verapamil (CALAN-SR) 120 MG CR tablet Take 120 mg by mouth daily as needed (for rapid heart rate).     . Vitamin D, Cholecalciferol, 50 MCG (2000 UT) CAPS Take 1 capsule by mouth daily.     No current facility-administered medications for this visit.      Past Medical History:  Diagnosis Date  . Chronic diastolic heart failure (HCC) 04/22/2015  . Complication of anesthesia   . Diverticular disease   . DJD (degenerative joint disease)   . Dysphagia   . GERD (gastroesophageal reflux disease)   . Hiatal hernia   . Hypertension   . Hypothyroidism   . Lower leg edema   . PAF (paroxysmal atrial fibrillation) (HCC)    s/p  ablation x several times now with occasional episodes of PAF.   She has failed sotolol/dofetilide/flecainide/propafenone/Amio and dronedarone either with not controlling her PAF or having side effects  . PONV (postoperative nausea and vomiting)   . Restless leg syndrome   . Seasonal allergies     ROS:   All systems reviewed and negative except as noted in the HPI.   Past Surgical History:  Procedure Laterality Date  . ABDOMINAL HYSTERECTOMY    . APPENDECTOMY    . ATRIAL FIBRILLATION ABLATION  09-06-13   2'14 -[facility]  . BALLOON DILATION  03/03/2011   Procedure: BALLOON DILATION;  Surgeon: Garlan Fair, MD;  Location: [facility];  Service: Endoscopy;  Laterality: N/A;  . bladder polyps    . BREAST EXCISIONAL BIOPSY Left   . BREAST SURGERY      left lumpectomy  . CARDIOVERSION     x3  . CARDIOVERSION N/A 12/30/2014   Procedure: CARDIOVERSION;  Surgeon: Lelon Perla, MD;  Location[facility] END[facility]COPY;  Service: Cardiovascular;  Laterality: N/A;  . CARDIOVERSION N/A 04/23/2015   Procedure: CARDIOVERSION;  Surgeon: Sanda Klein, MD;  Location: [facility] ENDOSCOPY;  Service: Cardiovascular;  Laterality: N/A;  . CATARACT EXTRACTION Left   . COLONOSCOPY WITH PROPOFOL N/A 09/24/2013   Procedure: COLONOSCOPY WITH PROPOFOL;  Surgeon: Garlan Fair, MD;  Location: [facility] ENDOSCOPY;  Service: Endoscopy;  Laterality: N/A;  . ESOPHAGOGASTRODUODENOSCOPY  03/03/2011   Procedure: ESOPHAGOGASTRODUODENOSCOPY (EGD);  Surgeon: Garlan Fair, MD;  Location: [facility];  Service: Endoscopy;  Laterality: N/A;  . EYE SURGERY     cataracat  . RHINOPLASTY    . RIGHT HEART CATHETERIZATION N/A 07/11/2014   Procedure: RIGHT HEART CATH;  Surgeon: Larey Dresser, MD;  Location: [facility] CATH LAB;  Service: Cardiovascular;  Laterality: N/A;  . ROTATOR CUFF REPAIR    . THYROIDECTOMY  1973  . TONSILLECTOMY  1942  . TUBAL LIGATION       Family History  Problem Relation Age of Onset  . Transient ischemic attack Mother   . Heart failure Father   . CVA Father   . Lung cancer Father   . Hypertension Sister   . Cancer Brother   . Sudden death Brother   . Anesthesia problems Neg Hx   . Hypotension Neg Hx   . Malignant hyperthermia Neg Hx   . Pseudochol deficiency Neg Hx   . Heart attack Neg Hx      Social History   Socioeconomic History  . Marital status: Married    Spouse name: Not on file  . Number of children: Not on file  . Years of education: Not on file  . Highest education level: Not on file  Occupational History  . Occupation: retired  Scientific laboratory technician  . Financial resource strain: Not on file  . Food insecurity    Worry: Not on file    Inability: Not on file  . Transportation needs    Medical: Not on file    Non-medical: Not on file  Tobacco Use   . Smoking status: Never Smoker  . Smokeless tobacco: Never Used  Substance and Sexual Activity  . Alcohol use: Yes    Alcohol/week: 0.0 standard drinks    Comment: rare wine  . Drug use: No  . Sexual activity: Never    Birth control/protection: Post-menopausal  Lifestyle  . Physical activity    Days per week: Not on file    Minutes per session: Not on file  .  Stress: Not on file  Relationships  . Social Musician on phone: Not on file    Gets together: Not on file    Attends religious service: Not on file    Active member of club or organization: Not on file    Attends meetings of clubs or organizations: Not on file    Relationship status: Not on file  . Intimate partner violence    Fear of current or ex partner: Not on file    Emotionally abused: Not on file    Physically abused: Not on file    Forced sexual activity: Not on file  Other Topics Concern  . Not on file  Social History Narrative  . Not on file     BP (!) 146/88   Pulse 71   Ht 5\' 9"  (1.753 m)   Wt 205 lb (93 kg)   SpO2 99%   BMI 30.27 kg/m   Physical Exam:  Well appearing elderly woman, NAD HEENT: Unremarkable Neck:  No JVD, no thyromegally Lymphatics:  No adenopathy Back:  No CVA tenderness Lungs:  Clear with no wheezes HEART:  Regular rate rhythm, no murmurs, no rubs, no clicks Abd:  soft, positive bowel sounds, no organomegally, no rebound, no guarding Ext:  2 plus pulses, no edema, no cyanosis, no clubbing Skin:  No rashes no nodules Neuro:  CN II through XII intact, motor grossly intact  EKG - nsr with rare PVC's  Assess/Plan: 1. Atrial fib/flutter - she is asymptomatic on amiodarone except for possible fatigue/malaise. I have recommended she continue the low dose amiodarone. She will continue low dose verapamil. 2. HTN - her BP is a little high but is better at home. We will follow. 3. Chronic diastolic heart failure - her symptoms are class 2 and I have asked her to reduce  her salt intake and lose weight. She will continue lasix. 4. Muscle aches - I doubt significant toxicity from amiodarone. We will check a cpk.  Natasha [name].D.

## 2018-12-19 ENCOUNTER — Telehealth: Payer: Self-pay

## 2018-12-19 NOTE — Telephone Encounter (Signed)
Call placed to Pt.  Left detailed message per DPR.  Advised TSH was abnormal.  Advised to follow up with ordering physician (Pt is on synthroid).  Will forward results to PCP.  Advised to call office if any further needs.

## 2018-12-26 DIAGNOSIS — R946 Abnormal results of thyroid function studies: Secondary | ICD-10-CM | POA: Diagnosis not present

## 2019-01-10 ENCOUNTER — Telehealth: Payer: Self-pay | Admitting: Internal Medicine

## 2019-01-10 MED ORDER — AMIODARONE HCL 200 MG PO TABS: 200.0000 mg | ORAL_TABLET | Freq: Every day | ORAL | 3 refills | Status: DC

## 2019-01-10 NOTE — Telephone Encounter (Signed)
New Message:   Pt said she saw on My-Chart that when she was here her EKG was Abnormal. She said she would like to discuss that please.  Pt also said her heart is out of rhythm.   Patient c/o Palpitations:  High priority if patient c/o  lightheadedness, shortness of breath, or chest pain   1) How long have you had palpitations/irregular HR/ Afib? Are you having the symptoms now?since Sunday  2) Are you currently experiencing lightheadedness, SOB or CP?  Shortness of breath  3) Do you have a history of afib (atrial fibrillation) or irregular heart rhythm? Yes she have a history of Atrial Fib  4) Have you checked your BP or HR? (document readings if available):yes and it was today 12071 and heart rate 80  5) Are you experiencing any other symptoms? Feet and knee swelling

## 2019-01-10 NOTE — Telephone Encounter (Signed)
Call returned to Pt.  Per Pt she has been in and out of afib since Sunday and wants to know what Dr. Lovena Le wants her to do.  Advised to increase amio to 200 mg by mouth daily.  Pt states her feet are swollen and abdomen.  Advised to increase lasix to 80 mg daily x 3 days.  Pt states she does not want to take more medicine.  Advised Pt she does not have to take our advice.  But advised if she felt bad this is what we are recommending she do.  Made 2 mo f/u for Pt.

## 2019-01-16 DIAGNOSIS — M170 Bilateral primary osteoarthritis of knee: Secondary | ICD-10-CM | POA: Diagnosis not present

## 2019-01-16 DIAGNOSIS — I119 Hypertensive heart disease without heart failure: Secondary | ICD-10-CM | POA: Diagnosis not present

## 2019-01-16 DIAGNOSIS — E039 Hypothyroidism, unspecified: Secondary | ICD-10-CM | POA: Diagnosis not present

## 2019-01-16 DIAGNOSIS — J449 Chronic obstructive pulmonary disease, unspecified: Secondary | ICD-10-CM | POA: Diagnosis not present

## 2019-01-16 DIAGNOSIS — E890 Postprocedural hypothyroidism: Secondary | ICD-10-CM | POA: Diagnosis not present

## 2019-01-16 DIAGNOSIS — E038 Other specified hypothyroidism: Secondary | ICD-10-CM | POA: Diagnosis not present

## 2019-01-16 DIAGNOSIS — I10 Essential (primary) hypertension: Secondary | ICD-10-CM | POA: Diagnosis not present

## 2019-01-16 DIAGNOSIS — I5032 Chronic diastolic (congestive) heart failure: Secondary | ICD-10-CM | POA: Diagnosis not present

## 2019-01-16 DIAGNOSIS — I4891 Unspecified atrial fibrillation: Secondary | ICD-10-CM | POA: Diagnosis not present

## 2019-01-22 ENCOUNTER — Telehealth: Payer: Self-pay | Admitting: Internal Medicine

## 2019-01-22 NOTE — Telephone Encounter (Signed)
Patient is calling about her amiodarone (PACERONE) 200 MG tablet, she states it has never returned to normal sinus rhythm, she wants some advise.

## 2019-01-22 NOTE — Telephone Encounter (Signed)
Spoke with patient at length regarding her HR/rhythm.  She reports feeling no improvement in rhythm since increasing her Amiodarone to 200 mg daily on 9/24.  She reports being in rhythm when taking Amiodarone BID in the past and wonders if she should increase dose back to that or if the Amiodarone could be causing the SOB.   BP has been 132/65 HR 70 and 125/80 HR 74 with her monitor reporting the rhythm is irregular.  She has not taken any Verapamil as she has not had a rapid heartbeat or "tachycardia."   She has not checked her VS today.  She reports getting extremely SOB with activity such as walking.  At times it is difficult for her to talk with and after walking but does resolve after resting for awhile.  She reports having edema at her feet and ankles bilaterally with right being worse than left.  The edema is better in the AM but reoccurs during the day/evening despite wearing knee high compression stockings.  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  She has not been weighing daily but feels her wts have been fairly stable.  She denies consuming excessive NA+, in fact reporting small meals d/t her "stomach being in her chest" as seen on a recent CT scan.   Her TSH was elevated and she has f/ued with Dr Inda Merlin regarding this (no medication change).  She is having repeat lab work at his office tomorrow.  She is asking for Dr Lovena Le to review this information and give further instructions.  Advised I will forward this to him for his review and contact her once that has occurred.  She asks to be contacted on her cell phone and would prefer being texted during the day since she will be at work and "they don't like [place] being on our phones."

## 2019-01-23 DIAGNOSIS — E038 Other specified hypothyroidism: Secondary | ICD-10-CM | POA: Diagnosis not present

## 2019-01-23 DIAGNOSIS — Z23 Encounter for immunization: Secondary | ICD-10-CM | POA: Diagnosis not present

## 2019-01-24 NOTE — Telephone Encounter (Signed)
Spoke with the pt to offer her the option to come in for a nurse visit and possibly set her up for a cardioversion... pt says that she is reluctant because she goes in and out so often that she is worried that she will be in NSR when she goes in for the procedure.Marland Kitchen and she has had so many and she cannot seem to stay out of Afib.   Pt says the Amiodarone has been doing well and she can notice improved heart rhythm on it but she has been working at [employer] and she cannot get through a day without the fatigue and low endurance that she experiences just from walking to and from her car.. although it is quite a distance. She says she has discussed this with Dr. Lovena Le and she is aware that she may not have much more improvement. But, she is feeling frustrated.   Pt had another TSH at [facility] yesterday.. I called and the result was still abnormal at 9.35 and Free T4 1.26.Marland Kitchen   She will consider another cardioversion and other options such as AV Node Ablation even though that would be a very last resort for her.   Pt is seeing Dr. Buddy Duty her Endocrinologist 02/05/19 but they left her message that they would like to see her sooner..  She will not be home today until after 5 pm so we cannot call her back but for now she wants to wait until she sees Dr. Buddy Duty... today so far she is normal rhythm.

## 2019-01-25 DIAGNOSIS — E039 Hypothyroidism, unspecified: Secondary | ICD-10-CM | POA: Diagnosis not present

## 2019-01-25 DIAGNOSIS — I4891 Unspecified atrial fibrillation: Secondary | ICD-10-CM | POA: Diagnosis not present

## 2019-01-25 DIAGNOSIS — E063 Autoimmune thyroiditis: Secondary | ICD-10-CM | POA: Diagnosis not present

## 2019-01-30 DIAGNOSIS — G2581 Restless legs syndrome: Secondary | ICD-10-CM | POA: Diagnosis not present

## 2019-01-30 DIAGNOSIS — I4891 Unspecified atrial fibrillation: Secondary | ICD-10-CM | POA: Diagnosis not present

## 2019-01-30 DIAGNOSIS — I5032 Chronic diastolic (congestive) heart failure: Secondary | ICD-10-CM | POA: Diagnosis not present

## 2019-01-30 DIAGNOSIS — E039 Hypothyroidism, unspecified: Secondary | ICD-10-CM | POA: Diagnosis not present

## 2019-01-30 DIAGNOSIS — N951 Menopausal and female climacteric states: Secondary | ICD-10-CM | POA: Diagnosis not present

## 2019-01-30 DIAGNOSIS — R0609 Other forms of dyspnea: Secondary | ICD-10-CM | POA: Diagnosis not present

## 2019-02-01 NOTE — Telephone Encounter (Signed)
Pt not wanting to schedule cardioversion per review of note.  Will await further needs-Pt wants to discuss further with her endocrinologist.  Await further needs.

## 2019-02-01 NOTE — Progress Notes (Deleted)
PCP:  Marden Noble, MD Primary Cardiologist: Armanda Magic, MD Electrophysiologist: None   Natasha [name] is a 85 y.o. female with past medical history of atrial fib/flutter, HTN, chronic diastolic CHF who presents today for routine electrophysiology followup. They are seen for Dr. Ladona Ridgel.   Since last being seen in our clinic, the patient reports doing very well.    The patient feels that she is tolerating medications without difficulties and is otherwise without complaint today.   Past Medical History:  Diagnosis Date  . Chronic diastolic heart failure (HCC) 04/22/2015  . Complication of anesthesia   . Diverticular disease   . DJD (degenerative joint disease)   . Dysphagia   . GERD (gastroesophageal reflux disease)   . Hiatal hernia   . Hypertension   . Hypothyroidism   . Lower leg edema   . PAF (paroxysmal atrial fibrillation) (HCC)    s/p ablation x several times now with occasional episodes of PAF.   She has failed sotolol/dofetilide/flecainide/propafenone/Amio and dronedarone either with not controlling her PAF or having side effects  . PONV (postoperative nausea and vomiting)   . Restless leg syndrome   . Seasonal allergies    Past Surgical History:  Procedure Laterality Date  . ABDOMINAL HYSTERECTOMY    . APPENDECTOMY    . ATRIAL FIBRILLATION ABLATION  09-06-13   2'14 -[facility]  . BALLOON DILATION  03/03/2011   Procedure: BALLOON DILATION;  Surgeon: Charolett Bumpers, MD;  Location: [facility];  Service: Endoscopy;  Laterality: N/A;  . bladder polyps    . BREAST EXCISIONAL BIOPSY Left   . BREAST SURGERY     left lumpectomy  . CARDIOVERSION     x3  . CARDIOVERSION N/A 12/30/2014   Procedure: CARDIOVERSION;  Surgeon: Lewayne Bunting, MD;  Location: [facility] ENDOSCOPY;  Service: Cardiovascular;  Laterality: N/A;  . CARDIOVERSION N/A 04/23/2015   Procedure: CARDIOVERSION;  Surgeon: Thurmon Fair, MD;  Location: [facility] ENDOSCOPY;  Service: Cardiovascular;  Laterality: N/A;   . CATARACT EXTRACTION Left   . COLONOSCOPY WITH PROPOFOL N/A 09/24/2013   Procedure: COLONOSCOPY WITH PROPOFOL;  Surgeon: Charolett Bumpers, MD;  Location: [facility] ENDOSCOPY;  Service: Endoscopy;  Laterality: N/A;  . ESOPHAGOGASTRODUODENOSCOPY  03/03/2011   Procedure: ESOPHAGOGASTRODUODENOSCOPY (EGD);  Surgeon: Charolett Bumpers, MD;  Location: [facility];  Service: Endoscopy;  Laterality: N/A;  . EYE SURGERY     cataracat  . RHINOPLASTY    . RIGHT HEART CATHETERIZATION N/A 07/11/2014   Procedure: RIGHT HEART CATH;  Surgeon: Laurey Morale, MD;  Location: [facility] CATH LAB;  Service: Cardiovascular;  Laterality: N/A;  . ROTATOR CUFF REPAIR    . THYROIDECTOMY  1973  . TONSILLECTOMY  1942  . TUBAL LIGATION      Current Outpatient Medications  Medication Sig Dispense Refill  . amiodarone (PACERONE) 200 MG tablet Take 1 tablet (200 mg total) by mouth daily. 90 tablet 3  . ELIQUIS 5 MG TABS tablet TAKE 1 TABLET BY MOUTH 2 TIMES DAILY. 60 tablet 6  . esomeprazole (NEXIUM) 20 MG capsule Take 20 mg by mouth every 3 (three) days.     Marland Kitchen estradiol (VIVELLE-DOT) 0.025 MG/24HR Place 1 patch onto the skin daily.    . furosemide (LASIX) 40 MG tablet TAKE 1 TABLET BY MOUTH DAILY. 90 tablet 2  . levothyroxine (SYNTHROID, LEVOTHROID) 125 MCG tablet Take 125 mcg by mouth daily before breakfast. BRAND ONLY    . LORazepam (ATIVAN) 1 MG tablet Take 0.5-1 mg by  mouth at bedtime as needed for sleep. For sleep    . Magnesium 500 MG CAPS Take 500 mg by mouth daily.    . potassium chloride (K-DUR) 10 MEQ tablet TAKE 1 TABLET BY MOUTH DAILY 30 tablet 11  . verapamil (CALAN-SR) 120 MG CR tablet Take 120 mg by mouth daily as needed (for rapid heart rate).     . Vitamin D, Cholecalciferol, 50 MCG (2000 UT) CAPS Take 1 capsule by mouth daily.     No current facility-administered medications for this visit.     Allergies  Allergen Reactions  . Codeine Nausea And Vomiting and Nausea Only  . Penicillins Hives    Has patient  had a PCN reaction causing immediate rash, facial/tongue/throat swelling, SOB or lightheadedness with hypotension: No Has patient had a PCN reaction causing severe rash involving mucus membranes or skin necrosis: No Has patient had a PCN reaction that required hospitalization No Has patient had a PCN reaction occurring within the last 10 years: No If all of the above answers are "NO", then may proceed with Cephalosporin use.  . Tetracycline Rash  . Tramadol Nausea Only  . Digoxin Other (See Comments)    Flushed neck and face  . Digoxin And Related Other (See Comments)    ' Makes my face blood red"  . Erythromycin Other (See Comments) and Hives    Flushing on face  . Other Other (See Comments)    ECG leads cause skin irritation. ECG leads cause skin irritation.  . Sulfa Antibiotics Other (See Comments)    PRESERVATIVE OF SOME FOODS- MAKES FLUSHES TO FACE AND NECK  . Tetracyclines & Related Other (See Comments)    "Sores on legs"  . Diltiazem Rash and Other (See Comments)    Rash with long acting form  . Diltiazem Hcl Rash    Rash with long acting form  . Sulfites Rash    PRESERVATIVE OF SOME FOODS- MAKES FLUSHES TO FACE AND NECK PRESERVATIVE OF SOME FOODS- MAKES FLUSHES TO FACE AND NECK    Social History   Socioeconomic History  . Marital status: Married    Spouse name: Not on file  . Number of children: Not on file  . Years of education: Not on file  . Highest education level: Not on file  Occupational History  . Occupation: retired  Scientific laboratory technician  . Financial resource strain: Not on file  . Food insecurity    Worry: Not on file    Inability: Not on file  . Transportation needs    Medical: Not on file    Non-medical: Not on file  Tobacco Use  . Smoking status: Never Smoker  . Smokeless tobacco: Never Used  Substance and Sexual Activity  . Alcohol use: Yes    Alcohol/week: 0.0 standard drinks    Comment: rare wine  . Drug use: No  . Sexual activity: Never     Birth control/protection: Post-menopausal  Lifestyle  . Physical activity    Days per week: Not on file    Minutes per session: Not on file  . Stress: Not on file  Relationships  . Social Herbalist on phone: Not on file    Gets together: Not on file    Attends religious service: Not on file    Active member of club or organization: Not on file    Attends meetings of clubs or organizations: Not on file    Relationship status: Not on file  .  Intimate partner violence    Fear of current or ex partner: Not on file    Emotionally abused: Not on file    Physically abused: Not on file    Forced sexual activity: Not on file  Other Topics Concern  . Not on file  Social History Narrative  . Not on file     Review of Systems: General: No chills, fever, night sweats or weight changes  Cardiovascular:  No chest pain, dyspnea on exertion, edema, orthopnea, palpitations, paroxysmal nocturnal dyspnea Dermatological: No rash, lesions or masses Respiratory: No cough, dyspnea Urologic: No hematuria, dysuria Abdominal: No nausea, vomiting, diarrhea, bright red blood per rectum, melena, or hematemesis Neurologic: No visual changes, weakness, changes in mental status All other systems reviewed and are otherwise negative except as noted above.  Physical Exam: There were no vitals filed for this visit.  GEN- The patient is well appearing, alert and oriented x 3 today.   HEENT: normocephalic, atraumatic; sclera clear, conjunctiva pink; hearing intact; oropharynx clear; neck supple, no JVP Lymph- no cervical lymphadenopathy Lungs- Clear to ausculation bilaterally, normal work of breathing.  No wheezes, rales, rhonchi Heart- Regular rate and rhythm, no murmurs, rubs or gallops, PMI not laterally displaced GI- soft, non-tender, non-distended, bowel sounds present, no hepatosplenomegaly Extremities- no clubbing, cyanosis, or edema; DP/PT/radial pulses 2+ bilaterally MS- no significant  deformity or atrophy Skin- warm and dry, no rash or lesion Psych- euthymic mood, full affect Neuro- strength and sensation are intact  EKG {ACTION; IS/IS TWS:[id number]} ordered. Personal review of EKG from {Blank single:19197::"today","***"} shows ***  Assessment and Plan: 1. Atrial fib/flutter ***  2. HTN Continue current regiment  3. Chronic diastolic CHF Volume status is ***  Graciella Freer, PA-C  02/01/19 3:22 PM

## 2019-02-04 ENCOUNTER — Ambulatory Visit: Payer: Medicare Other | Admitting: Student

## 2019-02-07 NOTE — Progress Notes (Signed)
PCP:  Josetta Huddle, MD Primary Cardiologist: Fransico Him, MD Electrophysiologist: None   Natasha [name] is a 85 y.o. female with past medical history of atrial fib/flutter, chronic diastolic CHF, HTN, and chronic dyspneas who presents today for routine electrophysiology followup. They are seen for Dr. Lovena Le.   Since last being seen in our clinic, she reports her AF is "stable" on amiodarone. She continues to have breakthroughs several times a week. Her biggest complaint today is SOB with mild exertion, regardless of rhythm. She is SOB walking around at work at [employer] of elections. Worse on inclines. She has mild to moderate orthopnea when she first lays down at night, and has peripheral edema that has been worsening lately. It improved with extra lasix initially. She remains  Hypothyroid with a recent synthroid adjustment and upcoming follow up.   Past Medical History:  Diagnosis Date  . Chronic diastolic heart failure ([place]) 04/22/2015  . Complication of anesthesia   . Diverticular disease   . DJD (degenerative joint disease)   . Dysphagia   . GERD (gastroesophageal reflux disease)   . Hiatal hernia   . Hypertension   . Hypothyroidism   . Lower leg edema   . PAF (paroxysmal atrial fibrillation) (HCC)    s/p ablation x several times now with occasional episodes of PAF.   She has failed sotolol/dofetilide/flecainide/propafenone/Amio and dronedarone either with not controlling her PAF or having side effects  . PONV (postoperative nausea and vomiting)   . Restless leg syndrome   . Seasonal allergies    Past Surgical History:  Procedure Laterality Date  . ABDOMINAL HYSTERECTOMY    . APPENDECTOMY    . ATRIAL FIBRILLATION ABLATION  09-06-13   2'14 -[facility]  . BALLOON DILATION  03/03/2011   Procedure: BALLOON DILATION;  Surgeon: Garlan Fair, MD;  Location: [facility];  Service: Endoscopy;  Laterality: N/A;  . bladder polyps    . BREAST EXCISIONAL BIOPSY Left   . BREAST  SURGERY     left lumpectomy  . CARDIOVERSION     x3  . CARDIOVERSION N/A 12/30/2014   Procedure: CARDIOVERSION;  Surgeon: Lelon Perla, MD;  Location: [facility] ENDOSCOPY;  Service: Cardiovascular;  Laterality: N/A;  . CARDIOVERSION N/A 04/23/2015   Procedure: CARDIOVERSION;  Surgeon: Sanda Klein, MD;  Location: [facility] ENDOSCOPY;  Service: Cardiovascular;  Laterality: N/A;  . CATARACT EXTRACTION Left   . COLONOSCOPY WITH PROPOFOL N/A 09/24/2013   Procedure: COLONOSCOPY WITH PROPOFOL;  Surgeon: Garlan Fair, MD;  Location: [facility] ENDOSCOPY;  Service: Endoscopy;  Laterality: N/A;  . ESOPHAGOGASTRODUODENOSCOPY  03/03/2011   Procedure: ESOPHAGOGASTRODUODENOSCOPY (EGD);  Surgeon: Garlan Fair, MD;  Location: [facility];  Service: Endoscopy;  Laterality: N/A;  . EYE SURGERY     cataracat  . RHINOPLASTY    . RIGHT HEART CATHETERIZATION N/A 07/11/2014   Procedure: RIGHT HEART CATH;  Surgeon: Larey Dresser, MD;  Location: [facility] C[facility] LAB;  Service: Cardiovascular;  Laterality: N/A;  . ROTATOR CUFF REPAIR    . THYROIDECTOMY  1973  . TONSILLECTOMY  1942  . TUBAL LIGATION      Current Outpatient Medications  Medication Sig Dispense Refill  . amiodarone (PACERONE) 200 MG tablet Take 1 tablet (200 mg total) by mouth daily. 90 tablet 3  . ELIQUIS 5 MG TABS tablet TAKE 1 TABLET BY MOUTH 2 TIMES DAILY. 60 tablet 6  . esomeprazole (NEXIUM) 20 MG capsule Take 20 mg by mouth every 3 (three) days.     Marland Kitchen  estradiol (VIVELLE-DOT) 0.025 MG/24HR Place 1 patch onto the skin daily.    . furosemide (LASIX) 40 MG tablet TAKE 1 TABLET BY MOUTH DAILY. 90 tablet 2  . levothyroxine (SYNTHROID, LEVOTHROID) 125 MCG tablet Take 125 mcg by mouth daily before breakfast. BRAND ONLY    . LORazepam (ATIVAN) 1 MG tablet Take 0.5-1 mg by mouth at bedtime as needed for sleep. For sleep    . Magnesium 500 MG CAPS Take 500 mg by mouth daily.    . potassium chloride (K-DUR) 10 MEQ tablet TAKE 1 TABLET BY MOUTH DAILY 30 tablet 11  .  verapamil (CALAN-SR) 120 MG CR tablet Take 120 mg by mouth daily as needed (for rapid heart rate).     . Vitamin D, Cholecalciferol, 50 MCG (2000 UT) CAPS Take 1 capsule by mouth daily.     No current facility-administered medications for this visit.     Allergies  Allergen Reactions  . Codeine Nausea And Vomiting and Nausea Only  . Penicillins Hives    Has patient had a PCN reaction causing immediate rash, facial/tongue/throat swelling, SOB or lightheadedness with hypotension: No Has patient had a PCN reaction causing severe rash involving mucus membranes or skin necrosis: No Has patient had a PCN reaction that required hospitalization No Has patient had a PCN reaction occurring within the last 10 years: No If all of the above answers are "NO", then may proceed with Cephalosporin use.  . Tetracycline Rash  . Tramadol Nausea Only  . Digoxin Other (See Comments)    Flushed neck and face  . Digoxin And Related Other (See Comments)    ' Makes my face blood red"  . Erythromycin Other (See Comments) and Hives    Flushing on face  . Other Other (See Comments)    ECG leads cause skin irritation. ECG leads cause skin irritation.  . Sulfa Antibiotics Other (See Comments)    PRESERVATIVE OF SOME FOODS- MAKES FLUSHES TO FACE AND NECK  . Tetracyclines & Related Other (See Comments)    "Sores on legs"  . Diltiazem Rash and Other (See Comments)    Rash with long acting form  . Diltiazem Hcl Rash    Rash with long acting form  . Sulfites Rash    PRESERVATIVE OF SOME FOODS- MAKES FLUSHES TO FACE AND NECK PRESERVATIVE OF SOME FOODS- MAKES FLUSHES TO FACE AND NECK    Social History   Socioeconomic History  . Marital status: Married    Spouse name: Not on file  . Number of children: Not on file  . Years of education: Not on file  . Highest education level: Not on file  Occupational History  . Occupation: retired  Scientific laboratory technician  . Financial resource strain: Not on file  . Food  insecurity    Worry: Not on file    Inability: Not on file  . Transportation needs    Medical: Not on file    Non-medical: Not on file  Tobacco Use  . Smoking status: Never Smoker  . Smokeless tobacco: Never Used  Substance and Sexual Activity  . Alcohol use: Yes    Alcohol/week: 0.0 standard drinks    Comment: rare wine  . Drug use: No  . Sexual activity: Never    Birth control/protection: Post-menopausal  Lifestyle  . Physical activity    Days per week: Not on file    Minutes per session: Not on file  . Stress: Not on file  Relationships  . Social connections  Talks on phone: Not on file    Gets together: Not on file    Attends religious service: Not on file    Active member of club or organization: Not on file    Attends meetings of clubs or organizations: Not on file    Relationship status: Not on file  . Intimate partner violence    Fear of current or ex partner: Not on file    Emotionally abused: Not on file    Physically abused: Not on file    Forced sexual activity: Not on file  Other Topics Concern  . Not on file  Social History Narrative  . Not on file     Review of Systems: General: No chills, fever, night sweats or weight changes  Cardiovascular:  No chest pain, dyspnea on exertion, edema, orthopnea, palpitations, paroxysmal nocturnal dyspnea Dermatological: No rash, lesions or masses Respiratory: No cough, dyspnea Urologic: No hematuria, dysuria Abdominal: No nausea, vomiting, diarrhea, bright red blood per rectum, melena, or hematemesis Neurologic: No visual changes, weakness, changes in mental status All other systems reviewed and are otherwise negative except as noted above.  Physical Exam: Vitals:   02/08/19 1107  BP: (!) 142/70  Pulse: 68  SpO2: 99%  Weight: 203 lb (92.1 kg)  Height: '5\' 9"'$  (1.753 m)    GEN- The patient is anxious appearing, alert and oriented x 3 today.   HEENT: normocephalic, atraumatic; sclera clear, conjunctiva  pink; hearing intact; oropharynx clear; neck supple, JVP at least 8-9 cm with mild HJR.  Lymph- no cervical lymphadenopathy Lungs- Clear to ausculation bilaterally, normal work of breathing.  No wheezes, rales, rhonchi Heart- Regular rate and rhythm, no murmurs, rubs or gallops, PMI not laterally displaced GI- soft, non-tender, non-distended, bowel sounds present, no hepatosplenomegaly Extremities- no clubbing or cyanosis; DP/PT/radial pulses 2+ bilaterally. Trace to 1+ edema 1/2 way to her knees.  MS- no significant deformity or atrophy Skin- warm and dry, no rash or lesion Psych- anxious mood, full affect Neuro- strength and sensation are intact  EKG is not ordered. Personal review of EKG from 12/18/2018 shows NSR at 71 bpm, QRS 68 ms, PR 258 ms, QTc 425.  Today, she is regular on exam and not having symptoms of AF, so this was not repeated.   Assessment and Plan:  1. Paroxysmal Afib/flutter She is regular on exam today, with only intermittent palpitations so no EKG ordered.  Continue amiodarone 200 mg daily for now.  Previous work up for amiodarone toxicity has been negative. ESR 06/2018 WNL, CK 12/2018 WNL.  Will repeat ESR today to help re-assure patient.  She is fairly symptomatic with Afib and has failed sotolol/tikosyn/flecainide/propafenone and multaq with either side effects or poor Afib control. Would continue amio for now.  TSH elevated and being treated as below.  Continue Eliquis for CHA2DS2VASC of 5    2. Dyspnea on exertion So far work up for [employer] toxicity has been negative. CK negative, ESR negative earlier this year, will repeat today.  PFTs 02/2018 with mild/moderate limitation.  Echo 12/2017 LVEF 55-60% and no evidence of PAH. (PAP 27 mmHg) Volume overload is certainly contributing, as well as a component of anxiety with no clear diagnosis.  If does not improve with diuresis; could consider referral to Pulmonary. High rest CT could help determine if there is a component of  ILD.   3. HTN Follow with increased lasix.   4. Chronic diastolic CHF NYHA III symptoms currently, confounded by amiodarone use and at  least mild COPD by most recent PFTs.  She is mild to moderately volume overloaded on exam with JVD with mild HJR, and trace to 1+ edema half way to her knees.  Increase lasix to 60 mg daily. She initially felt better after short term extra lasix.  BMET today.   5. Hypothyroidism TSH 14.7 12/18/2018 TSH 9.35 and Free T4 1.26 01/23/2019 Synthroid recently adjusted  Complicated patient with multiple co-morbidities that could be contributing to her dyspnea. Will increase diuretics and keep follow up with Dr. Lovena Le. My hope is that her symptoms will improve with diuresis and treatment of her thyroid.   Shirley Friar, PA-C  02/08/19 12:16 PM   Greater than 50% of the 40 minute visit was spent in counseling/coordination of care regarding disease state education, discussing lab results and meanings, medication education, and the use of diuretics.

## 2019-02-08 ENCOUNTER — Other Ambulatory Visit: Payer: Self-pay

## 2019-02-08 ENCOUNTER — Ambulatory Visit (INDEPENDENT_AMBULATORY_CARE_PROVIDER_SITE_OTHER): Payer: Medicare Other | Admitting: Student

## 2019-02-08 VITALS — BP 142/70 | HR 68 | Ht 69.0 in | Wt 203.0 lb

## 2019-02-08 DIAGNOSIS — I480 Paroxysmal atrial fibrillation: Secondary | ICD-10-CM

## 2019-02-08 DIAGNOSIS — E039 Hypothyroidism, unspecified: Secondary | ICD-10-CM

## 2019-02-08 DIAGNOSIS — I5032 Chronic diastolic (congestive) heart failure: Secondary | ICD-10-CM | POA: Diagnosis not present

## 2019-02-08 DIAGNOSIS — R5382 Chronic fatigue, unspecified: Secondary | ICD-10-CM | POA: Diagnosis not present

## 2019-02-08 DIAGNOSIS — R0600 Dyspnea, unspecified: Secondary | ICD-10-CM | POA: Diagnosis not present

## 2019-02-08 DIAGNOSIS — Z9229 Personal history of other drug therapy: Secondary | ICD-10-CM

## 2019-02-08 DIAGNOSIS — I10 Essential (primary) hypertension: Secondary | ICD-10-CM | POA: Diagnosis not present

## 2019-02-08 DIAGNOSIS — R0609 Other forms of dyspnea: Secondary | ICD-10-CM

## 2019-02-08 MED ORDER — FUROSEMIDE 40 MG PO TABS: 60.0000 mg | ORAL_TABLET | Freq: Every day | ORAL | 2 refills | Status: DC

## 2019-02-08 NOTE — Patient Instructions (Signed)
Medication Instructions:    START TAKING  LASIX 60 MG ONCE A DAY   *If you need a refill on your cardiac medications before your next appointment, please call your pharmacy*  Lab Work:  CMET AND SED RATE    If you have labs (blood work) drawn today and your tests are completely normal, you will receive your results only by: Marland Kitchen MyChart Message (if you have MyChart) OR . A paper copy in the mail If you have any lab test that is abnormal or we need to change your treatment, we will call you to review the results.  Testing/Procedures: NONE ORDERED  TODAY    Follow-Up: At [facility], you and your health needs are our priority.  As part of our continuing mission to provide you with exceptional heart care, we have created designated Provider Care Teams.  These Care Teams include your primary Cardiologist (physician) and Advanced Practice Providers (APPs -  Physician Assistants and Nurse Practitioners) who all work together to provide you with the care you need, when you need it. AS SCHEDULED

## 2019-02-09 ENCOUNTER — Other Ambulatory Visit: Payer: Self-pay | Admitting: Internal Medicine

## 2019-02-09 LAB — COMPREHENSIVE METABOLIC PANEL
ALT: 13 IU/L (ref 0–32)
AST: 17 IU/L (ref 0–40)
Albumin/Globulin Ratio: 1.5 (ref 1.2–2.2)
Albumin: 4.3 g/dL (ref 3.6–4.6)
Alkaline Phosphatase: 71 IU/L (ref 39–117)
BUN/Creatinine Ratio: 15 (ref 12–28)
BUN: 11 mg/dL (ref 8–27)
Bilirubin Total: 0.6 mg/dL (ref 0.0–1.2)
CO2: 23 mmol/L (ref 20–29)
Calcium: 8.8 mg/dL (ref 8.7–10.3)
Chloride: 99 mmol/L (ref 96–106)
Creatinine, Ser: 0.73 mg/dL (ref 0.57–1.00)
GFR calc Af Amer: 87 mL/min/{1.73_m2} (ref >59)
GFR calc non Af Amer: 76 mL/min/{1.73_m2} (ref >59)
Globulin, Total: 2.8 g/dL (ref 1.5–4.5)
Glucose: 94 mg/dL (ref 65–99)
Potassium: 4.4 mmol/L (ref 3.5–5.2)
Sodium: 134 mmol/L (ref 134–144)
Total Protein: 7.1 g/dL (ref 6.0–8.5)

## 2019-02-09 LAB — SEDIMENTATION RATE: Sed Rate: 10 mm/hr (ref 0–40)

## 2019-02-11 NOTE — Telephone Encounter (Signed)
Pt last saw Barrington Ellison, [place] on 02/08/19, last labs 02/08/19 Creat 0.73, age 85, weight 92.1kg, based on specified criteria pt is on appropriate dosage of Eliquis 5mg  BID.  Will refill rx.

## 2019-02-21 DIAGNOSIS — I119 Hypertensive heart disease without heart failure: Secondary | ICD-10-CM | POA: Diagnosis not present

## 2019-02-21 DIAGNOSIS — E890 Postprocedural hypothyroidism: Secondary | ICD-10-CM | POA: Diagnosis not present

## 2019-02-21 DIAGNOSIS — I4891 Unspecified atrial fibrillation: Secondary | ICD-10-CM | POA: Diagnosis not present

## 2019-02-21 DIAGNOSIS — E038 Other specified hypothyroidism: Secondary | ICD-10-CM | POA: Diagnosis not present

## 2019-02-21 DIAGNOSIS — I5032 Chronic diastolic (congestive) heart failure: Secondary | ICD-10-CM | POA: Diagnosis not present

## 2019-02-21 DIAGNOSIS — E039 Hypothyroidism, unspecified: Secondary | ICD-10-CM | POA: Diagnosis not present

## 2019-02-21 DIAGNOSIS — I10 Essential (primary) hypertension: Secondary | ICD-10-CM | POA: Diagnosis not present

## 2019-02-21 DIAGNOSIS — J449 Chronic obstructive pulmonary disease, unspecified: Secondary | ICD-10-CM | POA: Diagnosis not present

## 2019-02-21 DIAGNOSIS — M170 Bilateral primary osteoarthritis of knee: Secondary | ICD-10-CM | POA: Diagnosis not present

## 2019-03-08 DIAGNOSIS — I4891 Unspecified atrial fibrillation: Secondary | ICD-10-CM | POA: Diagnosis not present

## 2019-03-08 DIAGNOSIS — G2581 Restless legs syndrome: Secondary | ICD-10-CM | POA: Diagnosis not present

## 2019-03-08 DIAGNOSIS — I5032 Chronic diastolic (congestive) heart failure: Secondary | ICD-10-CM | POA: Diagnosis not present

## 2019-03-08 DIAGNOSIS — E039 Hypothyroidism, unspecified: Secondary | ICD-10-CM | POA: Diagnosis not present

## 2019-03-08 DIAGNOSIS — N951 Menopausal and female climacteric states: Secondary | ICD-10-CM | POA: Diagnosis not present

## 2019-03-08 DIAGNOSIS — R0609 Other forms of dyspnea: Secondary | ICD-10-CM | POA: Diagnosis not present

## 2019-03-12 ENCOUNTER — Ambulatory Visit (INDEPENDENT_AMBULATORY_CARE_PROVIDER_SITE_OTHER): Payer: Medicare Other | Admitting: Internal Medicine

## 2019-03-12 ENCOUNTER — Other Ambulatory Visit: Payer: Self-pay

## 2019-03-12 ENCOUNTER — Encounter: Payer: Self-pay | Admitting: Internal Medicine

## 2019-03-12 VITALS — BP 116/78 | HR 83 | Ht 69.0 in | Wt 208.2 lb

## 2019-03-12 DIAGNOSIS — I4819 Other persistent atrial fibrillation: Secondary | ICD-10-CM | POA: Diagnosis not present

## 2019-03-12 DIAGNOSIS — I5032 Chronic diastolic (congestive) heart failure: Secondary | ICD-10-CM

## 2019-03-12 NOTE — Progress Notes (Signed)
HPI Natasha [name] returns today for followup of her persistent atrial fib. She is a pleasant 85 yo woman with a long h/o atrial fib s/p catheter ablation twice. She has had recurrent arrhythmias. I placed her on amiodarone several months ago after she began to have recurrent and worsening symptoms. She has felt poorly despite going back to NSR. I tried reducing her dose and she has begun to have break through episodes. She had her dose increased to 200 mg daily. She has felt poorly. She is still having symptoms. Her ESR was normal. She has chronic dyspnea.   Allergies  Allergen Reactions  . Codeine Nausea And Vomiting and Nausea Only  . Penicillins Hives    Has patient had a PCN reaction causing immediate rash, facial/tongue/throat swelling, SOB or lightheadedness with hypotension: No Has patient had a PCN reaction causing severe rash involving mucus membranes or skin necrosis: No Has patient had a PCN reaction that required hospitalization No Has patient had a PCN reaction occurring within the last 10 years: No If all of the above answers are "NO", then may proceed with Cephalosporin use.  . Tetracycline Rash  . Tramadol Nausea Only  . Digoxin Other (See Comments)    Flushed neck and face  . Digoxin And Related Other (See Comments)    ' Makes my face blood red"  . Erythromycin Other (See Comments) and Hives    Flushing on face  . Other Other (See Comments)    ECG leads cause skin irritation. ECG leads cause skin irritation.  . Sulfa Antibiotics Other (See Comments)    PRESERVATIVE OF SOME FOODS- MAKES FLUSHES TO FACE AND NECK  . Tetracyclines & Related Other (See Comments)    "Sores on legs"  . Diltiazem Rash and Other (See Comments)    Rash with long acting form  . Diltiazem Hcl Rash    Rash with long acting form  . Sulfites Rash    PRESERVATIVE OF SOME FOODS- MAKES FLUSHES TO FACE AND NECK PRESERVATIVE OF SOME FOODS- MAKES FLUSHES TO FACE AND NECK     Current Outpatient  Medications  Medication Sig Dispense Refill  . ELIQUIS 5 MG TABS tablet TAKE 1 TABLET BY MOUTH 2 TIMES DAILY. 60 tablet 6  . esomeprazole (NEXIUM) 20 MG capsule Take 20 mg by mouth every 3 (three) days.     Marland Kitchen estradiol (VIVELLE-DOT) 0.025 MG/24HR Place 1 patch onto the skin daily.    . furosemide (LASIX) 40 MG tablet Take 1.5 tablets (60 mg total) by mouth daily. 90 tablet 2  . levothyroxine (SYNTHROID, LEVOTHROID) 125 MCG tablet Take 137 mcg by mouth daily before breakfast. BRAND ONLY     . LORazepam (ATIVAN) 1 MG tablet Take 0.5-1 mg by mouth at bedtime as needed for sleep. For sleep    . Magnesium 500 MG CAPS Take 500 mg by mouth daily.    . potassium chloride (K-DUR) 10 MEQ tablet TAKE 1 TABLET BY MOUTH DAILY 30 tablet 11  . SYNTHROID 137 MCG tablet Take 137 mcg by mouth daily.    . verapamil (CALAN-SR) 120 MG CR tablet Take 120 mg by mouth daily as needed (for rapid heart rate).     . Vitamin D, Cholecalciferol, 50 MCG (2000 UT) CAPS Take 1 capsule by mouth daily.     No current facility-administered medications for this visit.      Past Medical History:  Diagnosis Date  . Chronic diastolic heart failure ([place]) 04/22/2015  .  Complication of anesthesia   . Diverticular disease   . DJD (degenerative joint disease)   . Dysphagia   . GERD (gastroesophageal reflux disease)   . Hiatal hernia   . Hypertension   . Hypothyroidism   . Lower leg edema   . PAF (paroxysmal atrial fibrillation) (HCC)    s/p ablation x several times now with occasional episodes of PAF.   She has failed sotolol/dofetilide/flecainide/propafenone/Amio and dronedarone either with not controlling her PAF or having side effects  . PONV (postoperative nausea and vomiting)   . Restless leg syndrome   . Seasonal allergies     ROS:   All systems reviewed and negative except as noted in the HPI.   Past Surgical History:  Procedure Laterality Date  . ABDOMINAL HYSTERECTOMY    . APPENDECTOMY    . ATRIAL  FIBRILLATION ABLATION  09-06-13   2'14 -[facility]  . BALLOON DILATION  03/03/2011   Procedure: BALLOON DILATION;  Surgeon: Garlan Fair, MD;  Location: [facility];  Service: Endoscopy;  Laterality: N/A;  . bladder polyps    . BREAST EXCISIONAL BIOPSY Left   . BREAST SURGERY     left lumpectomy  . CARDIOVERSION     x3  . CARDIOVERSION N/A 12/30/2014   Procedure: CARDIOVERSION;  Surgeon: Lelon Perla, MD;  Location: [facility] ENDOSCOPY;  Service: Cardiovascular;  Laterality: N/A;  . CARDIOVERSION N/A 04/23/2015   Procedure: CARDIOVERSION;  Surgeon: Sanda Klein, MD;  Location: [facility] ENDOSCOPY;  Service: Cardiovascular;  Laterality: N/A;  . CATARACT EXTRACTION Left   . COLONOSCOPY WITH PROPOFOL N/A 09/24/2013   Procedure: COLONOSCOPY WITH PROPOFOL;  Surgeon: Garlan Fair, MD;  Location: [facility]COPY;  Service: Endoscopy;  Laterality: N/A;  . ESOPHAGOGASTRODUODENOSCOPY  03/03/2011   Procedure: ESOPHAGOGASTRODUODENOSCOPY (EGD);  Surgeon: Garlan Fair, MD;  Location: [facility];  Service: Endoscopy;  Laterality: N/A;  . EYE SURGERY     cataracat  . RHINOPLASTY    . RIGHT HEART CATHETERIZATION N/A 07/11/2014   Procedure: RIGHT HEART CATH;  Surgeon: Larey Dresser, MD;  Location: [facility] C[facility] LAB;  Service: Cardiovascular;  Laterality: N/A;  . ROTATOR CUFF REPAIR    . THYROIDECTOMY  1973  . TONSILLECTOMY  1942  . TUBAL LIGATION       Family History  Problem Relation Age of Onset  . Transient ischemic attack Mother   . Heart failure Father   . CVA Father   . Lung cancer Father   . Hypertension Sister   . Cancer Brother   . Sudden death Brother   . Anesthesia problems Neg Hx   . Hypotension Neg Hx   . Malignant hyperthermia Neg Hx   . Pseudochol deficiency Neg Hx   . Heart attack Neg Hx      Social History   Socioeconomic History  . Marital status: Married    Spouse name: Not on file  . Number of children: Not on file  . Years of education: Not on file  . Highest  education level: Not on file  Occupational History  . Occupation: retired  Scientific laboratory technician  . Financial resource strain: Not on file  . Food insecurity    Worry: Not on file    Inability: Not on file  . Transportation needs    Medical: Not on file    Non-medical: Not on file  Tobacco Use  . Smoking status: Never Smoker  . Smokeless tobacco: Never Used  Substance and Sexual Activity  . Alcohol  use: Yes    Alcohol/week: 0.0 standard drinks    Comment: rare wine  . Drug use: No  . Sexual activity: Never    Birth control/protection: Post-menopausal  Lifestyle  . Physical activity    Days per week: Not on file    Minutes per session: Not on file  . Stress: Not on file  Relationships  . Social Herbalist on phone: Not on file    Gets together: Not on file    Attends religious service: Not on file    Active member of club or organization: Not on file    Attends meetings of clubs or organizations: Not on file    Relationship status: Not on file  . Intimate partner violence    Fear of current or ex partner: Not on file    Emotionally abused: Not on file    Physically abused: Not on file    Forced sexual activity: Not on file  Other Topics Concern  . Not on file  Social History Narrative  . Not on file     BP 116/78   Pulse 83   Ht 5' 9"  (1.753 m)   Wt 208 lb 3.2 oz (94.4 kg)   SpO2 99%   BMI 30.75 kg/m   Physical Exam:  Well appearing NAD HEENT: Unremarkable Neck:  No JVD, no thyromegally Lymphatics:  No adenopathy Back:  No CVA tenderness Lungs:  Clear with no wheezes HEART:  Regular rate rhythm, no murmurs, no rubs, no clicks Abd:  soft, positive bowel sounds, no organomegally, no rebound, no guarding Ext:  2 plus pulses, no edema, no cyanosis, no clubbing Skin:  No rashes no nodules Neuro:  CN II through XII intact, motor grossly intact  EKG - nsr with frequent PAC's  Assess/Plan: 1. Persistent atrial fib - I have discussed the treatment options  in detail. I have reviewed the indications/risks/benefits of AV node ablation and PPM. She does not want to proceed. She will stop amiodarone. She will start taking verapamil. She will stop amiodarone. 2. HTN - her SBP is well controlled today.  3. Chronic diastolic heart failure - her symptoms are class 2. She will continue her current meds.  Mikle [email or web address].D.

## 2019-03-12 NOTE — Patient Instructions (Addendum)
Medication Instructions:  Your physician has recommended you make the following change in your medication:   1.  STOP taking amiodarone  2.  Take VERAPAMIL as needed   Labwork: None ordered.  Testing/Procedures: None ordered.  Follow-Up: Your physician wants you to follow-up in: 3 months with Dr. Lovena Le.      Any Other Special Instructions Will Be Listed Below (If Applicable).  If you need a refill on your cardiac medications before your next appointment, please call your pharmacy.

## 2019-04-07 ENCOUNTER — Emergency Department (HOSPITAL_COMMUNITY): Payer: Medicare Other

## 2019-04-07 ENCOUNTER — Encounter (HOSPITAL_COMMUNITY): Payer: Self-pay | Admitting: Emergency Medicine

## 2019-04-07 ENCOUNTER — Other Ambulatory Visit: Payer: Self-pay

## 2019-04-07 ENCOUNTER — Emergency Department (HOSPITAL_COMMUNITY)
Admission: EM | Admit: 2019-04-07 | Discharge: 2019-04-08 | Disposition: A | Discharge status: Home / Self Care | Payer: Medicare Other | Attending: Emergency Medicine | Admitting: Emergency Medicine

## 2019-04-07 DIAGNOSIS — Y92012 Bathroom of single-family (private) house as the place of occurrence of the external cause: Secondary | ICD-10-CM | POA: Insufficient documentation

## 2019-04-07 DIAGNOSIS — E039 Hypothyroidism, unspecified: Secondary | ICD-10-CM | POA: Insufficient documentation

## 2019-04-07 DIAGNOSIS — S0003XA Contusion of scalp, initial encounter: Secondary | ICD-10-CM | POA: Insufficient documentation

## 2019-04-07 DIAGNOSIS — Y998 Other external cause status: Secondary | ICD-10-CM | POA: Insufficient documentation

## 2019-04-07 DIAGNOSIS — I10 Essential (primary) hypertension: Secondary | ICD-10-CM | POA: Insufficient documentation

## 2019-04-07 DIAGNOSIS — Z7901 Long term (current) use of anticoagulants: Secondary | ICD-10-CM | POA: Insufficient documentation

## 2019-04-07 DIAGNOSIS — I110 Hypertensive heart disease with heart failure: Secondary | ICD-10-CM | POA: Diagnosis not present

## 2019-04-07 DIAGNOSIS — Y9389 Activity, other specified: Secondary | ICD-10-CM | POA: Insufficient documentation

## 2019-04-07 DIAGNOSIS — W19XXXA Unspecified fall, initial encounter: Secondary | ICD-10-CM

## 2019-04-07 DIAGNOSIS — Z79899 Other long term (current) drug therapy: Secondary | ICD-10-CM | POA: Insufficient documentation

## 2019-04-07 DIAGNOSIS — W01198A Fall on same level from slipping, tripping and stumbling with subsequent striking against other object, initial encounter: Secondary | ICD-10-CM | POA: Diagnosis not present

## 2019-04-07 DIAGNOSIS — I5032 Chronic diastolic (congestive) heart failure: Secondary | ICD-10-CM | POA: Diagnosis not present

## 2019-04-07 DIAGNOSIS — Y92009 Unspecified place in unspecified non-institutional (private) residence as the place of occurrence of the external cause: Secondary | ICD-10-CM

## 2019-04-07 DIAGNOSIS — S0990XA Unspecified injury of head, initial encounter: Secondary | ICD-10-CM | POA: Diagnosis present

## 2019-04-07 NOTE — ED Triage Notes (Addendum)
Pt reports she was in her bathroom tonight trying to warm up her legs with a heater because she hast RLS, states she just lost her balance and fell, she hit her head and has a hematoma to posterior scalp, c/o posterior head pain and some neck pain as well. Reports she is on eloquis for hx of afib. Denies LOC. A/ox4, moves all limbs equally, speech clear, no neuro deficits.

## 2019-04-07 NOTE — ED Notes (Signed)
Quincy Carnes, PA made aware of patient, verbal order for CT of head and neck given to this RN.

## 2019-04-08 ENCOUNTER — Telehealth: Payer: Self-pay | Admitting: Internal Medicine

## 2019-04-08 NOTE — Telephone Encounter (Signed)
New Message  Patient states that she had a really bad fall where she hit her head pretty hard. Went to the hospital and had CT done, there is no bleeding on her head. Patient would like to know if she should continue taking the Eliquis or not based on her hitting her head. Please give patient a call back at [phone].

## 2019-04-08 NOTE — ED Provider Notes (Signed)
[facility] EMERGENCY DEPARTMENT Provider Note   CSN: [id number] Arrival date & time: 04/07/19  2305   History Chief Complaint  Patient presents with   Fall    on blood thinners    Natasha [name] is a 85 y.o. female.  The history is provided by the patient.  Fall  She has history of hypertension, chronic diastolic heart failure, paroxysmal atrial fibrillation anticoagulated on apixaban, and comes in following a fall at home.  She was in her bathroom warming her legs (she does this because of restless leg syndrome) when she lost her balance and fell back striking her occiput.  She denies loss of consciousness.  She denies other injury.   Past Medical History:  Diagnosis Date   Chronic diastolic heart failure (HCC) 04/22/2015   Complication of anesthesia    Diverticular disease    DJD (degenerative joint disease)    Dysphagia    GERD (gastroesophageal reflux disease)    Hiatal hernia    Hypertension    Hypothyroidism    Lower leg edema    PAF (paroxysmal atrial fibrillation) (HCC)    s/p ablation x several times now with occasional episodes of PAF.   She has failed sotolol/dofetilide/flecainide/propafenone/Amio and dronedarone either with not controlling her PAF or having side effects   PONV (postoperative nausea and vomiting)    Restless leg syndrome    Seasonal allergies     Patient Active Problem List   Diagnosis Date Noted   Chronic fatigue 01/10/2017   Chronic diastolic heart failure (HCC) 04/22/2015   Nodule of right lung 09/07/2014   SOB (shortness of breath) 12/12/2013   Femoral bruit 08/27/2013   Claudication (HCC) 08/02/2013   GERD (gastroesophageal reflux disease)    Hypertension    Lower leg edema    Hypothyroidism    Restless leg syndrome    DJD (degenerative joint disease)    Diverticular disease    PONV (postoperative nausea and vomiting)    Hiatal hernia    History of anticoagulant therapy  05/18/2011   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   History of migraine headaches 05/18/2011   Persistent atrial fibrillation (HCC) 05/18/2011   Dysphagia 03/03/2011    Past Surgical History:  Procedure Laterality Date   ABDOMINAL HYSTERECTOMY     APPENDECTOMY     ATRIAL FIBRILLATION ABLATION  09-06-13   2'14 -[facility]   BALLOON DILATION  03/03/2011   Procedure: BALLOON DILATION;  Surgeon: Charolett Bumpers, MD;  Location: [facility] ENDOSCOPY;  Service: Endoscopy;  Laterality: N/A;   bladder polyps     BREAST EXCISIONAL BIOPSY Left    BREAST SURGERY     left lumpectomy   CARDIOVERSION     x3   CARDIOVERSION N/A 12/30/2014   Procedure: CARDIOVERSION;  Surgeon: Lewayne Bunting, MD;  Location: [facility] ENDOSCOPY;  Service: Cardiovascular;  Laterality: N/A;   CARDIOVERSION N/A 04/23/2015   Procedure: CARDIOVERSION;  Surgeon: Thurmon Fair, MD;  Location: [facility] ENDOSCOPY;  Service: Cardiovascular;  Laterality: N/A;   CATARACT EXTRACTION Left    COLONOSCOPY WITH PROPOFOL N/A 09/24/2013   Procedure: COLONOSCOPY WITH PROPOFOL;  Surgeon: Charolett Bumpers, MD;  Location: [facility] ENDOSCOPY;  Service: Endoscopy;  Laterality: N/A;   ESOPHAGOGASTRODUODENOSCOPY  03/03/2011   Procedure: ESOPHAGOGASTRODUODENOSCOPY (EGD);  Surgeon: Charolett Bumpers, MD;  Location: [facility] ENDOSCOPY;  Service: Endoscopy;  Laterality: N/A;   EYE SURGERY     cataracat   RHINOPLASTY     RIGHT HEART CATHETERIZATION N/A 07/11/2014  Procedure: RIGHT HEART CATH;  Surgeon: Laurey Moralealton S McLean, MD;  Location: [facility] CATH LAB;  Service: Cardiovascular;  Laterality: N/A;   ROTATOR CUFF REPAIR     THYROIDECTOMY  1973   TONSILLECTOMY  1942   TUBAL LIGATION       OB History   No obstetric history on file.     Family History  Problem Relation Age of Onset   Transient ischemic attack Mother    Heart failure Father    CVA Father    Lung cancer Father    Hypertension Sister    Cancer Brother    Sudden  death Brother    Anesthesia problems Neg Hx    Hypotension Neg Hx    Malignant hyperthermia Neg Hx    Pseudochol deficiency Neg Hx    Heart attack Neg Hx     Social History   Tobacco Use   Smoking status: Never Smoker   Smokeless tobacco: Never Used  Substance Use Topics   Alcohol use: Yes    Alcohol/week: 0.0 standard drinks    Comment: rare wine   Drug use: No    Home Medications Prior to Admission medications   Medication Sig Start Date End Date Taking? Authorizing Provider  ELIQUIS 5 MG TABS tablet TAKE 1 TABLET BY MOUTH 2 TIMES DAILY. 02/11/19   Marinus Mawaylor, Gregg W, MD  esomeprazole (NEXIUM) 20 MG capsule Take 20 mg by mouth every 3 (three) days.     [provider]  estradiol (VIVELLE-DOT) 0.025 MG/24HR Place 1 patch onto the skin daily.    [provider]  furosemide (LASIX) 40 MG tablet Take 1.5 tablets (60 mg total) by mouth daily. 02/08/19   Graciella Freerillery, Michael Andrew, PA-C  levothyroxine (SYNTHROID, LEVOTHROID) 125 MCG tablet Take 137 mcg by mouth daily before breakfast. BRAND ONLY     [provider]  LORazepam (ATIVAN) 1 MG tablet Take 0.5-1 mg by mouth at bedtime as needed for sleep. For sleep    [provider]  Magnesium 500 MG CAPS Take 500 mg by mouth daily.    [provider]  potassium chloride (K-DUR) 10 MEQ tablet TAKE 1 TABLET BY MOUTH DAILY 11/23/18   Quintella Reicherturner, Traci R, MD  SYNTHROID 137 MCG tablet Take 137 mcg by mouth daily. 01/25/19   [provider]  verapamil (CALAN-SR) 120 MG CR tablet Take 120 mg by mouth daily as needed (for rapid heart rate).     [provider]  Vitamin D, Cholecalciferol, 50 MCG (2000 UT) CAPS Take 1 capsule by mouth daily.    [provider]    Allergies    Codeine, Penicillins, Tetracycline, Tramadol, Digoxin, Digoxin and related, Erythromycin, Other, Sulfa antibiotics, Tetracyclines & related, Diltiazem, Diltiazem hcl, and Sulfites  Review of Systems     Review of Systems  All other systems reviewed and are negative.   Physical Exam Updated Vital Signs BP (!) 154/84 (BP Location: Right Arm)    Pulse 71    Temp 97.8 F (36.6 C) (Oral)    Resp 16    SpO2 99%   Physical Exam Vitals and nursing note reviewed.   85 year old female, resting comfortably and in no acute distress. Vital signs are significant for elevated blood pressure. Oxygen saturation is 99%, which is normal. Head is normocephalic.  2.5 cm hematoma present on the occiput. PERRLA, EOMI. Oropharynx is clear. Neck is nontender without adenopathy or JVD. Back is nontender and there is no CVA tenderness. Lungs are  clear without rales, wheezes, or rhonchi. Chest is nontender. Heart has regular rate and rhythm without murmur. Abdomen is soft, flat, nontender without masses or hepatosplenomegaly and peristalsis is normoactive. Extremities have no cyanosis or edema, full range of motion is present. Skin is warm and dry without rash. Neurologic: Mental status is normal, cranial nerves are intact, there are no motor or sensory deficits.  ED Results / Procedures / Treatments   Labs (all labs ordered are listed, but only abnormal results are displayed) Labs Reviewed - No data to display  EKG None  Radiology CT Head Wo Contrast  Result Date: 04/07/2019 CLINICAL DATA:  Fall hitting head on Eliquis EXAM: CT HEAD WITHOUT CONTRAST TECHNIQUE: Contiguous axial images were obtained from the base of the skull through the vertex without intravenous contrast. COMPARISON:  None. FINDINGS: Brain: No evidence of acute territorial infarction, hemorrhage, hydrocephalus,extra-axial collection or mass lesion/mass effect. There is dilatation the ventricles and sulci consistent with age-related atrophy. Low-attenuation changes in the deep white matter consistent with small vessel ischemia. Vascular: No hyperdense vessel or unexpected calcification. Skull: The skull is intact. No fracture or focal  lesion identified. Sinuses/Orbits: The visualized paranasal sinuses and mastoid air cells are clear. The orbits and globes intact. Other: A small area of soft tissue swelling seen over the posterior occiput. Cervical spine: Alignment: Physiologic Skull base and vertebrae: Visualized skull base is intact. No atlanto-occipital dissociation. The vertebral body heights are well maintained. No fracture or pathologic osseous lesion seen. Soft tissues and spinal canal: The visualized paraspinal soft tissues are unremarkable. No prevertebral soft tissue swelling is seen. The spinal canal is grossly unremarkable, no large epidural collection or significant canal narrowing. Disc levels: Cervical spine spondylosis is seen throughout. This is most notable at C5-C6 with disc osteophyte complex and uncovertebral osteophytes which causes moderate to severe left neural foraminal narrowing and mild central canal stenosis. Upper chest: Nonspecific biapical ground-glass opacity. Asymmetric probable goiter seen within the left thyroid gland. Other: None IMPRESSION: 1.  No acute intracranial abnormality. 2. Findings consistent with age related atrophy and chronic small vessel ischemia 3. Small amount of soft tissue swelling seen over the posterior occiput 4.  No acute fracture or malalignment of the spine. Electronically Signed   By: Prudencio Pair M.D.   On: 04/07/2019 23:58   CT Cervical Spine Wo Contrast  Result Date: 04/07/2019 CLINICAL DATA:  Fall hitting head on Eliquis EXAM: CT HEAD WITHOUT CONTRAST TECHNIQUE: Contiguous axial images were obtained from the base of the skull through the vertex without intravenous contrast. COMPARISON:  None. FINDINGS: Brain: No evidence of acute territorial infarction, hemorrhage, hydrocephalus,extra-axial collection or mass lesion/mass effect. There is dilatation the ventricles and sulci consistent with age-related atrophy. Low-attenuation changes in the deep white matter consistent with small  vessel ischemia. Vascular: No hyperdense vessel or unexpected calcification. Skull: The skull is intact. No fracture or focal lesion identified. Sinuses/Orbits: The visualized paranasal sinuses and mastoid air cells are clear. The orbits and globes intact. Other: A small area of soft tissue swelling seen over the posterior occiput. Cervical spine: Alignment: Physiologic Skull base and vertebrae: Visualized skull base is intact. No atlanto-occipital dissociation. The vertebral body heights are well maintained. No fracture or pathologic osseous lesion seen. Soft tissues and spinal canal: The visualized paraspinal soft tissues are unremarkable. No prevertebral soft tissue swelling is seen. The spinal canal is grossly unremarkable, no large epidural collection or significant canal narrowing. Disc levels: Cervical spine spondylosis is seen throughout. This is  most notable at C5-C6 with disc osteophyte complex and uncovertebral osteophytes which causes moderate to severe left neural foraminal narrowing and mild central canal stenosis. Upper chest: Nonspecific biapical ground-glass opacity. Asymmetric probable goiter seen within the left thyroid gland. Other: None IMPRESSION: 1.  No acute intracranial abnormality. 2. Findings consistent with age related atrophy and chronic small vessel ischemia 3. Small amount of soft tissue swelling seen over the posterior occiput 4.  No acute fracture or malalignment of the spine. Electronically Signed   By: Jonna Clark M.D.   On: 04/07/2019 23:58    Procedures Procedures   Medications Ordered in ED Medications - No data to display  ED Course  I have reviewed the triage vital signs and the nursing notes.  Pertinent imaging results that were available during my care of the patient were reviewed by me and considered in my medical decision making (see chart for details).    MDM Rules/Calculators/A&P                     Fall with scalp contusion and patient anticoagulated  on apixaban.  She is sent for CT of head and cervical spine which show no acute injury other than mild soft tissue swelling corresponding to the hematoma noted on physical exam.  Patient is discharged with instructions to return should any neurologic symptoms occur in the next 1-2 days.  Old records were reviewed, and she has no prior ED visits for falls.  Final Clinical Impression(s) / ED Diagnoses Final diagnoses:  Fall at home, initial encounter  Contusion of occipital region of scalp, initial encounter  Chronic anticoagulation  Elevated blood pressure reading with diagnosis of hypertension    Rx / DC Orders ED Discharge Orders    None       Dione Booze, MD 04/08/19 [phone]

## 2019-04-08 NOTE — Discharge Instructions (Addendum)
Apply ice as needed to reduce swelling.  Your CT scan did not show any bleeding, but bleeding con  sometimes be delayed when you are taking blood thinners. If you are having any problems in the next 1-2 days, then return so the CT scan can be repeated.

## 2019-04-08 NOTE — Telephone Encounter (Signed)
Returned call to Pt.  Pt has not had any neurological changes since her fall.  Advised to continue her blood thinner as instructed by ER and call if any changes.  Pt thanked nurse for call.

## 2019-04-16 DIAGNOSIS — E038 Other specified hypothyroidism: Secondary | ICD-10-CM | POA: Diagnosis not present

## 2019-04-16 DIAGNOSIS — M170 Bilateral primary osteoarthritis of knee: Secondary | ICD-10-CM | POA: Diagnosis not present

## 2019-04-16 DIAGNOSIS — I4891 Unspecified atrial fibrillation: Secondary | ICD-10-CM | POA: Diagnosis not present

## 2019-04-16 DIAGNOSIS — I5032 Chronic diastolic (congestive) heart failure: Secondary | ICD-10-CM | POA: Diagnosis not present

## 2019-04-16 DIAGNOSIS — I119 Hypertensive heart disease without heart failure: Secondary | ICD-10-CM | POA: Diagnosis not present

## 2019-04-16 DIAGNOSIS — E039 Hypothyroidism, unspecified: Secondary | ICD-10-CM | POA: Diagnosis not present

## 2019-04-16 DIAGNOSIS — I10 Essential (primary) hypertension: Secondary | ICD-10-CM | POA: Diagnosis not present

## 2019-04-16 DIAGNOSIS — J449 Chronic obstructive pulmonary disease, unspecified: Secondary | ICD-10-CM | POA: Diagnosis not present

## 2019-04-16 DIAGNOSIS — E890 Postprocedural hypothyroidism: Secondary | ICD-10-CM | POA: Diagnosis not present

## 2019-04-24 ENCOUNTER — Encounter: Payer: Self-pay | Admitting: Neurology

## 2019-05-14 DIAGNOSIS — E039 Hypothyroidism, unspecified: Secondary | ICD-10-CM | POA: Diagnosis not present

## 2019-05-16 DIAGNOSIS — M170 Bilateral primary osteoarthritis of knee: Secondary | ICD-10-CM | POA: Diagnosis not present

## 2019-05-16 DIAGNOSIS — E890 Postprocedural hypothyroidism: Secondary | ICD-10-CM | POA: Diagnosis not present

## 2019-05-16 DIAGNOSIS — E038 Other specified hypothyroidism: Secondary | ICD-10-CM | POA: Diagnosis not present

## 2019-05-16 DIAGNOSIS — I5032 Chronic diastolic (congestive) heart failure: Secondary | ICD-10-CM | POA: Diagnosis not present

## 2019-05-16 DIAGNOSIS — I4891 Unspecified atrial fibrillation: Secondary | ICD-10-CM | POA: Diagnosis not present

## 2019-05-16 DIAGNOSIS — I10 Essential (primary) hypertension: Secondary | ICD-10-CM | POA: Diagnosis not present

## 2019-05-16 DIAGNOSIS — E039 Hypothyroidism, unspecified: Secondary | ICD-10-CM | POA: Diagnosis not present

## 2019-05-16 DIAGNOSIS — J449 Chronic obstructive pulmonary disease, unspecified: Secondary | ICD-10-CM | POA: Diagnosis not present

## 2019-05-16 DIAGNOSIS — I119 Hypertensive heart disease without heart failure: Secondary | ICD-10-CM | POA: Diagnosis not present

## 2019-05-29 NOTE — Progress Notes (Signed)
NEUROLOGY CONSULTATION NOTE  [name] MRN: [id number] DOB: [birth year]  Referring provider: Josetta Huddle, MD Primary care provider: Josetta Huddle, MD  Reason for consult:  Weakness, restless leg syndrome  HISTORY OF PRESENT ILLNESS: Natasha [name] is an 86 year old white female with HTN, PAF, chronic diastolic heart failure, DJD, hypothyroidism, and restless leg syndrome who presents for generalized weakness.  History supplemented by referring provider note.   She has had a very gradual onset of lower extremity weakness beginning around 2016 or 2017.  She feels wobbly on her feet.  Over the past year, she no longer is able to walk up steps with her two feet.  She needs to pull herself, either grabbing the bannister or bending over and climbing up the steps on all fours.  Her legs ache.  She was evaluated by pain management for low back and left hip pain.  She has an MRI of the lumbar spine without contrast on 05/02/2018, which was personally reviewed, and showed multilevel lumbar spondylosis and degenerative endplate marrow edema at L1-L2 with left lateral disc bulge and spurring and mild left L1 foraminal stenosis as well as edema in the medial left psoas muscle at the L1-L2 level.  She was told surgery wasn't an option.  She was supposed to have an injection but was cancelled due to onset of the Covid pandemic and she never followed up.  .    She presented to the ED on 04/07/2019 following a fall in which she struck the back of her head, sustaining a contusion.  CT of head and cervical spine personally reviewed showed no evidence of trauma or other acute abnormality.  No associated dizziness or passing out.  She also reports numbness and tingling involving her fingers and toes.  She feels shooting pain in the right ring finger and left middle finger.  She reports spurs in her neck but denies any significant neck pain.  No weakness in the upper extremities.  She has restless leg syndrome which  has gotten worse over the past several months.  It feels like that her legs are jumping.  She needs to keep moving her legs for relief.  She is starting to notice it earlier in the evening while sitting and watching TV.  She was started on pramipexole 0.125mg  2 to 3 hours before bedtime which initially was helpful but then needed to increase to 0.25mg .  .  PAST MEDICAL HISTORY: Past Medical History:  Diagnosis Date  . Chronic diastolic heart failure ([place]) 04/22/2015  . Complication of anesthesia   . Diverticular disease   . DJD (degenerative joint disease)   . Dysphagia   . GERD (gastroesophageal reflux disease)   . Hiatal hernia   . Hypertension   . Hypothyroidism   . Lower leg edema   . PAF (paroxysmal atrial fibrillation) (HCC)    s/p ablation x several times now with occasional episodes of PAF.   She has failed sotolol/dofetilide/flecainide/propafenone/Amio and dronedarone either with not controlling her PAF or having side effects  . PONV (postoperative nausea and vomiting)   . Restless leg syndrome   . Seasonal allergies     PAST SURGICAL HISTORY: Past Surgical History:  Procedure Laterality Date  . ABDOMINAL HYSTERECTOMY    . APPENDECTOMY    . ATRIAL FIBRILLATION ABLATION  09-06-13   2'14 -[facility]  . BALLOON DILATION  03/03/2011   Procedure: BALLOON DILATION;  Surgeon: Garlan Fair, MD;  Location: [facility] ENDOSCOPY;  Service:  Endoscopy;  Laterality: N/A;  . bladder polyps    . BREAST EXCISIONAL BIOPSY Left   . BREAST SURGERY     left lumpectomy  . CARDIOVERSION     x3  . CARDIOVERSION N/A 12/30/2014   Procedure: CARDIOVERSION;  Surgeon: Lewayne Bunting, MD;  Location: [facility] ENDOSCOPY;  Service: Cardiovascular;  Laterality: N/A;  . CARDIOVERSION N/A 04/23/2015   Procedure: CARDIOVERSION;  Surgeon: Thurmon Fair, MD;  Location: [facility] ENDOSCOPY;  Service: Cardiovascular;  Laterality: N/A;  . CATARACT EXTRACTION Left   . COLONOSCOPY WITH PROPOFOL N/A 09/24/2013   Procedure:  COLONOSCOPY WITH PROPOFOL;  Surgeon: Charolett Bumpers, MD;  Location: [facility] ENDOSCOPY;  Service: Endoscopy;  Laterality: N/A;  . ESOPHAGOGASTRODUODENOSCOPY  03/03/2011   Procedure: ESOPHAGOGASTRODUODENOSCOPY (EGD);  Surgeon: Charolett Bumpers, MD;  Location: [facility] ENDOSCOPY;  Service: Endoscopy;  Laterality: N/A;  . EYE SURGERY     cataracat  . RHINOPLASTY    . RIGHT HEART CATHETERIZATION N/A 07/11/2014   Procedure: RIGHT HEART CATH;  Surgeon: Laurey Morale, MD;  Location: [facility] CATH LAB;  Service: Cardiovascular;  Laterality: N/A;  . ROTATOR CUFF REPAIR    . THYROIDECTOMY  1973  . TONSILLECTOMY  1942  . TUBAL LIGATION      MEDICATIONS: Current Outpatient Medications on File Prior to Visit  Medication Sig Dispense Refill  . ELIQUIS 5 MG TABS tablet TAKE 1 TABLET BY MOUTH 2 TIMES DAILY. 60 tablet 6  . esomeprazole (NEXIUM) 20 MG capsule Take 20 mg by mouth every 3 (three) days.     Marland Kitchen estradiol (VIVELLE-DOT) 0.025 MG/24HR Place 1 patch onto the skin daily.    . furosemide (LASIX) 40 MG tablet Take 1.5 tablets (60 mg total) by mouth daily. 90 tablet 2  . levothyroxine (SYNTHROID, LEVOTHROID) 125 MCG tablet Take 137 mcg by mouth daily before breakfast. BRAND ONLY     . LORazepam (ATIVAN) 1 MG tablet Take 0.5-1 mg by mouth at bedtime as needed for sleep. For sleep    . Magnesium 500 MG CAPS Take 500 mg by mouth daily.    . potassium chloride (K-DUR) 10 MEQ tablet TAKE 1 TABLET BY MOUTH DAILY 30 tablet 11  . SYNTHROID 137 MCG tablet Take 137 mcg by mouth daily.    . verapamil (CALAN-SR) 120 MG CR tablet Take 120 mg by mouth daily as needed (for rapid heart rate).     . Vitamin D, Cholecalciferol, 50 MCG (2000 UT) CAPS Take 1 capsule by mouth daily.     No current facility-administered medications on file prior to visit.    ALLERGIES: Allergies  Allergen Reactions  . Codeine Nausea And Vomiting and Nausea Only  . Penicillins Hives    Has patient had a PCN reaction causing immediate rash,  facial/tongue/throat swelling, SOB or lightheadedness with hypotension: No Has patient had a PCN reaction causing severe rash involving mucus membranes or skin necrosis: No Has patient had a PCN reaction that required hospitalization No Has patient had a PCN reaction occurring within the last 10 years: No If all of the above answers are "NO", then may proceed with Cephalosporin use.  . Tetracycline Rash  . Tramadol Nausea Only  . Digoxin Other (See Comments)    Flushed neck and face  . Digoxin And Related Other (See Comments)    ' Makes my face blood red"  . Erythromycin Other (See Comments) and Hives    Flushing on face  . Other Other (See Comments)    ECG leads  cause skin irritation. ECG leads cause skin irritation.  . Sulfa Antibiotics Other (See Comments)    PRESERVATIVE OF SOME FOODS- MAKES FLUSHES TO FACE AND NECK  . Tetracyclines & Related Other (See Comments)    "Sores on legs"  . Diltiazem Rash and Other (See Comments)    Rash with long acting form  . Diltiazem Hcl Rash    Rash with long acting form  . Sulfites Rash    PRESERVATIVE OF SOME FOODS- MAKES FLUSHES TO FACE AND NECK PRESERVATIVE OF SOME FOODS- MAKES FLUSHES TO FACE AND NECK    FAMILY HISTORY: Family History  Problem Relation Age of Onset  . Transient ischemic attack Mother   . Heart failure Father   . CVA Father   . Lung cancer Father   . Hypertension Sister   . Cancer Brother   . Sudden death Brother   . Anesthesia problems Neg Hx   . Hypotension Neg Hx   . Malignant hyperthermia Neg Hx   . Pseudochol deficiency Neg Hx   . Heart attack Neg Hx   .  SOCIAL HISTORY: Social History   Socioeconomic History  . Marital status: Married    Spouse name: Not on file  . Number of children: Not on file  . Years of education: Not on file  . Highest education level: Not on file  Occupational History  . Occupation: retired  Tobacco Use  . Smoking status: Never Smoker  . Smokeless tobacco: Never Used    Substance and Sexual Activity  . Alcohol use: Yes    Alcohol/week: 0.0 standard drinks    Comment: rare wine  . Drug use: No  . Sexual activity: Never    Birth control/protection: Post-menopausal  Other Topics Concern  . Not on file  Social History Narrative  . Not on file   Social Determinants of Health   Financial Resource Strain:   . Difficulty of Paying Living Expenses: Not on file  Food Insecurity:   . Worried About Programme researcher, broadcasting/film/video in the Last Year: Not on file  . Ran Out of Food in the Last Year: Not on file  Transportation Needs:   . Lack of Transportation (Medical): Not on file  . Lack of Transportation (Non-Medical): Not on file  Physical Activity:   . Days of Exercise per Week: Not on file  . Minutes of Exercise per Session: Not on file  Stress:   . Feeling of Stress : Not on file  Social Connections:   . Frequency of Communication with Friends and Family: Not on file  . Frequency of Social Gatherings with Friends and Family: Not on file  . Attends Religious Services: Not on file  . Active Member of Clubs or Organizations: Not on file  . Attends Banker Meetings: Not on file  . Marital Status: Not on file  Intimate Partner Violence:   . Fear of Current or Ex-Partner: Not on file  . Emotionally Abused: Not on file  . Physically Abused: Not on file  . Sexually Abused: Not on file    PHYSICAL EXAM: Blood pressure (!) 181/74, pulse 74, resp. rate 18, height 5\' 9"  (1.753 m), weight 201 lb (91.2 kg), SpO2 98 %. General: No acute distress.  Patient appears well-groomed.  Head:  Normocephalic/atraumatic Eyes:  fundi examined but not visualized Neck: supple, no paraspinal tenderness, full range of motion Back: No paraspinal tenderness Heart: regular rate and rhythm Lungs: Clear to auscultation bilaterally. Vascular: No carotid bruits.  Neurological Exam: Mental status: alert and oriented to person, place, and time, recent and remote memory  intact, fund of knowledge intact, attention and concentration intact, speech fluent and not dysarthric, language intact. Cranial nerves: CN I: not tested CN II: pupils equal, round and reactive to light, visual fields intact CN III, IV, VI:  full range of motion, no nystagmus, no ptosis CN V: facial sensation intact CN VII: upper and lower face symmetric CN VIII: hearing intact CN IX, X: gag intact, uvula midline CN XI: sternocleidomastoid and trapezius muscles intact CN XII: tongue midline Bulk & Tone: normal, no fasciculations. Motor:  4-/5 bilateral hip flexion but otherwise 5/5. Sensation:  Pinprick sensation reduced in fingers and toes; and vibration sensation reduced in toes.. Deep Tendon Reflexes: 2+ throughout but slightly brisk in upper extremities; toes downgoing. Finger to nose testing:  Without dysmetria. Heel to shin:  Without dysmetria. Gait:  Wobbling gait.  Unsteady.  Romberg negative.  IMPRESSION: 1.  Bilateral lower extremity weakness.  She does exhibit  2.  Numbness and tingling 3.  Restless leg syndrome.   PLAN: 1.  Will have her take 0.125mg  pramipexole in late afternoon to cover symptoms in early evening and she will continue 0.25mg  2-3 hours prior to bedtime for now. 2.  Will check labs:  B12, folate, ferritin, TSH, CK, aldolase 3.  NCV-EMG of lower extremities 4.  Follow up with PCP regarding blood pressure 5.  Follow up in 3 months.  Thank you for allowing me to take part in the care of this patient.  Shon Millet, DO  CC: Marden Noble, MD

## 2019-05-31 ENCOUNTER — Ambulatory Visit (INDEPENDENT_AMBULATORY_CARE_PROVIDER_SITE_OTHER): Payer: Medicare Other | Admitting: Neurology

## 2019-05-31 ENCOUNTER — Other Ambulatory Visit: Payer: Medicare Other

## 2019-05-31 ENCOUNTER — Other Ambulatory Visit: Payer: Self-pay

## 2019-05-31 ENCOUNTER — Encounter: Payer: Self-pay | Admitting: Neurology

## 2019-05-31 VITALS — BP 181/74 | HR 74 | Resp 18 | Ht 69.0 in | Wt 201.0 lb

## 2019-05-31 DIAGNOSIS — I10 Essential (primary) hypertension: Secondary | ICD-10-CM

## 2019-05-31 DIAGNOSIS — R29898 Other symptoms and signs involving the musculoskeletal system: Secondary | ICD-10-CM | POA: Diagnosis not present

## 2019-05-31 DIAGNOSIS — G2581 Restless legs syndrome: Secondary | ICD-10-CM

## 2019-05-31 DIAGNOSIS — R200 Anesthesia of skin: Secondary | ICD-10-CM

## 2019-05-31 DIAGNOSIS — R202 Paresthesia of skin: Secondary | ICD-10-CM | POA: Diagnosis not present

## 2019-05-31 DIAGNOSIS — E611 Iron deficiency: Secondary | ICD-10-CM | POA: Diagnosis not present

## 2019-05-31 NOTE — Patient Instructions (Addendum)
1.  Take 1 tablet of pramipexole in late afternoon and 2 tablets 2-3 hours before bedtime. 2.  We will check labs:  B12, folate, TSH, ferritin, CK, aldolase 3.  We will check nerve study of legs   Your provider has requested that you have labwork completed today. Please go to [facility] ([id number]) on the second floor of this building before leaving the office today. You do not need to check in. If you are not called within 15 minutes please check with the front desk.

## 2019-06-03 LAB — CK: Total CK: 54 U/L (ref 29–143)

## 2019-06-03 LAB — ALDOLASE: Aldolase: 5.1 U/L (ref <=8.1)

## 2019-06-03 LAB — FERRITIN: Ferritin: 65 ng/mL (ref 16–288)

## 2019-06-03 LAB — B12 AND FOLATE PANEL
Folate: 12.7 ng/mL
Vitamin B-12: 346 pg/mL (ref 200–1100)

## 2019-06-03 LAB — TSH: TSH: 1.09 mIU/L (ref 0.40–4.50)

## 2019-06-07 DIAGNOSIS — J449 Chronic obstructive pulmonary disease, unspecified: Secondary | ICD-10-CM | POA: Diagnosis not present

## 2019-06-07 DIAGNOSIS — I10 Essential (primary) hypertension: Secondary | ICD-10-CM | POA: Diagnosis not present

## 2019-06-07 DIAGNOSIS — I119 Hypertensive heart disease without heart failure: Secondary | ICD-10-CM | POA: Diagnosis not present

## 2019-06-07 DIAGNOSIS — E039 Hypothyroidism, unspecified: Secondary | ICD-10-CM | POA: Diagnosis not present

## 2019-06-07 DIAGNOSIS — I5032 Chronic diastolic (congestive) heart failure: Secondary | ICD-10-CM | POA: Diagnosis not present

## 2019-06-07 DIAGNOSIS — E890 Postprocedural hypothyroidism: Secondary | ICD-10-CM | POA: Diagnosis not present

## 2019-06-07 DIAGNOSIS — M170 Bilateral primary osteoarthritis of knee: Secondary | ICD-10-CM | POA: Diagnosis not present

## 2019-06-07 DIAGNOSIS — I4891 Unspecified atrial fibrillation: Secondary | ICD-10-CM | POA: Diagnosis not present

## 2019-06-10 ENCOUNTER — Ambulatory Visit: Payer: Medicare Other | Admitting: Internal Medicine

## 2019-06-11 ENCOUNTER — Ambulatory Visit (INDEPENDENT_AMBULATORY_CARE_PROVIDER_SITE_OTHER): Payer: Medicare Other | Admitting: Internal Medicine

## 2019-06-11 ENCOUNTER — Encounter: Payer: Self-pay | Admitting: Internal Medicine

## 2019-06-11 ENCOUNTER — Other Ambulatory Visit: Payer: Self-pay

## 2019-06-11 VITALS — BP 150/84 | HR 87 | Ht 69.0 in | Wt 200.2 lb

## 2019-06-11 DIAGNOSIS — I4819 Other persistent atrial fibrillation: Secondary | ICD-10-CM

## 2019-06-11 DIAGNOSIS — I5032 Chronic diastolic (congestive) heart failure: Secondary | ICD-10-CM | POA: Diagnosis not present

## 2019-06-11 NOTE — Patient Instructions (Addendum)

## 2019-06-11 NOTE — Progress Notes (Signed)
HPI [name] returns today for followup. She is a pleasant 86 yo woman with PAF, s/p ablation twice with recurrent atrial fib who was placed on amiodarone but developed severe fatigue and malaise and the amio was stopped. She has not had much in the way of symptomatic atrial fib. She does note worsening musculoskeletal pain. She denies sob or chest pain or syncope. No edema.  Allergies  Allergen Reactions  . Codeine Nausea And Vomiting and Nausea Only  . Penicillins Hives    Has patient had a PCN reaction causing immediate rash, facial/tongue/throat swelling, SOB or lightheadedness with hypotension: No Has patient had a PCN reaction causing severe rash involving mucus membranes or skin necrosis: No Has patient had a PCN reaction that required hospitalization No Has patient had a PCN reaction occurring within the last 10 years: No If all of the above answers are "NO", then may proceed with Cephalosporin use.  . Tetracycline Rash  . Tramadol Nausea Only  . Digoxin Other (See Comments)    Flushed neck and face  . Digoxin And Related Other (See Comments)    ' Makes my face blood red"  . Erythromycin Other (See Comments) and Hives    Flushing on face  . Other Other (See Comments)    ECG leads cause skin irritation. ECG leads cause skin irritation.  . Sulfa Antibiotics Other (See Comments)    PRESERVATIVE OF SOME FOODS- MAKES FLUSHES TO FACE AND NECK  . Tetracyclines & Related Other (See Comments)    "Sores on legs"  . Diltiazem Rash and Other (See Comments)    Rash with long acting form  . Diltiazem Hcl Rash    Rash with long acting form  . Sulfites Rash    PRESERVATIVE OF SOME FOODS- MAKES FLUSHES TO FACE AND NECK PRESERVATIVE OF SOME FOODS- MAKES FLUSHES TO FACE AND NECK     Current Outpatient Medications  Medication Sig Dispense Refill  . ELIQUIS 5 MG TABS tablet TAKE 1 TABLET BY MOUTH 2 TIMES DAILY. 60 tablet 6  . esomeprazole (NEXIUM) 20 MG capsule Take 20 mg by  mouth every 3 (three) days.     Marland Kitchen estradiol (CLIMARA - DOSED IN MG/24 HR) 0.025 mg/24hr patch Place 0.025 mg onto the skin once a week.    . furosemide (LASIX) 40 MG tablet Take 40 mg by mouth daily.    Marland Kitchen levothyroxine (SYNTHROID, LEVOTHROID) 125 MCG tablet Take 150 mcg by mouth daily before breakfast. BRAND ONLY     . LORazepam (ATIVAN) 1 MG tablet Take 0.5-1 mg by mouth at bedtime as needed for sleep. For sleep    . Magnesium 500 MG CAPS Take 500 mg by mouth daily.    . potassium chloride (K-DUR) 10 MEQ tablet TAKE 1 TABLET BY MOUTH DAILY 30 tablet 11  . pramipexole (MIRAPEX) 0.125 MG tablet Take 3 tablets by mouth daily.    . Riboflavin 400 MG CAPS Take 1 capsule by mouth daily.    Marland Kitchen SYNTHROID 150 MCG tablet Take 150 mcg by mouth daily.    . verapamil (CALAN-SR) 120 MG CR tablet Take 120 mg by mouth daily as needed (for rapid heart rate).     . Vitamin D, Cholecalciferol, 50 MCG (2000 UT) CAPS Take 1 capsule by mouth daily.     No current facility-administered medications for this visit.     Past Medical History:  Diagnosis Date  . Chronic diastolic heart failure (HCC) 04/22/2015  . Complication  of anesthesia   . Diverticular disease   . DJD (degenerative joint disease)   . Dysphagia   . GERD (gastroesophageal reflux disease)   . Hiatal hernia   . Hypertension   . Hypothyroidism   . Lower leg edema   . PAF (paroxysmal atrial fibrillation) (HCC)    s/p ablation x several times now with occasional episodes of PAF.   She has failed sotolol/dofetilide/flecainide/propafenone/Amio and dronedarone either with not controlling her PAF or having side effects  . PONV (postoperative nausea and vomiting)   . Restless leg syndrome   . Seasonal allergies     ROS:   All systems reviewed and negative except as noted in the HPI.   Past Surgical History:  Procedure Laterality Date  . ABDOMINAL HYSTERECTOMY    . APPENDECTOMY    . ATRIAL FIBRILLATION ABLATION  09-06-13   2'14 -[facility]   . BALLOON DILATION  03/03/2011   Procedure: BALLOON DILATION;  Surgeon: Charolett Bumpers, MD;  Location: [facility];  Service: Endoscopy;  Laterality: N/A;  . bladder polyps    . BREAST EXCISIONAL BIOPSY Left   . BREAST SURGERY     left lumpectomy  . CARDIOVERSION     x3  . CARDIOVERSION N/A 12/30/2014   Procedure: CARDIOVERSION;  Surgeon: Lewayne Bunting, MD;  Location: [facility] ENDOSCOPY;  Service: Cardiovascular;  Laterality: N/A;  . CARDIOVERSION N/A 04/23/2015   Procedure: CARDIOVERSION;  Surgeon: Thurmon Fair, MD;  Location: [facility] ENDOSCOPY;  Service: Cardiovascular;  Laterality: N/A;  . CATARACT EXTRACTION Left   . COLONOSCOPY WITH PROPOFOL N/A 09/24/2013   Procedure: COLONOSCOPY WITH PROPOFOL;  Surgeon: Charolett Bumpers, MD;  Location: [facility]COPY;  Service: Endoscopy;  Laterality: N/A;  . ESOPHAGOGASTRODUODENOSCOPY  03/03/2011   Procedure: ESOPHAGOGASTRODUODENOSCOPY (EGD);  Surgeon: Charolett Bumpers, MD;  Location: [facility];  Service: Endoscopy;  Laterality: N/A;  . EYE SURGERY     cataracat  . RHINOPLASTY    . RIGHT HEART CATHETERIZATION N/A 07/11/2014   Procedure: RIGHT HEART CATH;  Surgeon: Laurey Morale, MD;  Location: [facility] CATH LAB;  Service: Cardiovascular;  Laterality: N/A;  . ROTATOR CUFF REPAIR    . THYROIDECTOMY  1973  . TONSILLECTOMY  1942  . TUBAL LIGATION       Family History  Problem Relation Age of Onset  . Transient ischemic attack Mother   . Heart failure Father   . CVA Father   . Lung cancer Father   . Hypertension Sister   . Cancer Brother   . Sudden death Brother   . Anesthesia problems Neg Hx   . Hypotension Neg Hx   . Malignant hyperthermia Neg Hx   . Pseudochol deficiency Neg Hx   . Heart attack Neg Hx      Social History   Socioeconomic History  . Marital status: Married    Spouse name: Not on file  . Number of children: Not on file  . Years of education: Not on file  . Highest education level: Not on file  Occupational History  .  Occupation: retired  Tobacco Use  . Smoking status: Never Smoker  . Smokeless tobacco: Never Used  Substance and Sexual Activity  . Alcohol use: Yes    Alcohol/week: 0.0 standard drinks    Comment: rare wine  . Drug use: No  . Sexual activity: Never    Birth control/protection: Post-menopausal  Other Topics Concern  . Not on file  Social History Narrative   Right handed  Two story home   Social Determinants of Health   Financial Resource Strain:   . Difficulty of Paying Living Expenses: Not on file  Food Insecurity:   . Worried About Charity fundraiser in the Last Year: Not on file  . Ran Out of Food in the Last Year: Not on file  Transportation Needs:   . Lack of Transportation (Medical): Not on file  . Lack of Transportation (Non-Medical): Not on file  Physical Activity:   . Days of Exercise per Week: Not on file  . Minutes of Exercise per Session: Not on file  Stress:   . Feeling of Stress : Not on file  Social Connections:   . Frequency of Communication with Friends and Family: Not on file  . Frequency of Social Gatherings with Friends and Family: Not on file  . Attends Religious Services: Not on file  . Active Member of Clubs or Organizations: Not on file  . Attends Archivist Meetings: Not on file  . Marital Status: Not on file  Intimate Partner Violence:   . Fear of Current or Ex-Partner: Not on file  . Emotionally Abused: Not on file  . Physically Abused: Not on file  . Sexually Abused: Not on file     BP (!) 150/84   Pulse 87   Ht 5\' 9"  (1.753 m)   Wt 200 lb 3.2 oz (90.8 kg)   SpO2 96%   BMI 29.56 kg/m   Physical Exam:  Well appearing NAD HEENT: Unremarkable Neck:  No JVD, no thyromegally Lymphatics:  No adenopathy Back:  No CVA tenderness Lungs:  Clear with no wheezes HEART:  Regular rate rhythm, no murmurs, no rubs, no clicks Abd:  soft, positive bowel sounds, no organomegally, no rebound, no guarding Ext:  2 plus pulses, no  edema, no cyanosis, no clubbing Skin:  No rashes no nodules Neuro:  CN II through XII intact, motor grossly intact  EKG - nsr with PAC's  Assess/Plan: 1. Atrial fib/flutter - she is improved despite being off of the amiodarone. She feels better. She appears to be maintaining NSR. She will continue her current meds. As her rates are controlled, no indication for AV node ablation/PPM at this time. 2. HTN -her sbp is up a bit. She is usually better controlled at home. 3. Arthritis - she has significant knee pain. We discussed knee replacement surgery and injections and I think she could undergo either from a cardiac perspective, though she would have to come off of eliquis. 4. Diastolic heart failure - she is class1 in NSR and 3 in atrial fib. We will follow.  Mikle Bosworth.D.

## 2019-06-17 ENCOUNTER — Other Ambulatory Visit: Payer: Self-pay | Admitting: Internal Medicine

## 2019-06-17 DIAGNOSIS — Z1231 Encounter for screening mammogram for malignant neoplasm of breast: Secondary | ICD-10-CM

## 2019-06-17 DIAGNOSIS — M1712 Unilateral primary osteoarthritis, left knee: Secondary | ICD-10-CM | POA: Diagnosis not present

## 2019-06-17 DIAGNOSIS — M170 Bilateral primary osteoarthritis of knee: Secondary | ICD-10-CM | POA: Diagnosis not present

## 2019-06-17 DIAGNOSIS — M1711 Unilateral primary osteoarthritis, right knee: Secondary | ICD-10-CM | POA: Diagnosis not present

## 2019-06-25 ENCOUNTER — Other Ambulatory Visit: Payer: Self-pay

## 2019-06-25 ENCOUNTER — Telehealth: Payer: Self-pay | Admitting: Neurology

## 2019-06-25 DIAGNOSIS — R29898 Other symptoms and signs involving the musculoskeletal system: Secondary | ICD-10-CM

## 2019-06-25 NOTE — Telephone Encounter (Signed)
I got her Schedule for 07-24-19

## 2019-06-25 NOTE — Telephone Encounter (Signed)
Please scheduled NCV bilteral lower extremitys thanks

## 2019-06-25 NOTE — Telephone Encounter (Signed)
Patient wants to know if she is to have testing done Dr Everlena Cooper said something about it but she does not know what kind and no one has called her about getting it set up

## 2019-07-02 DIAGNOSIS — E039 Hypothyroidism, unspecified: Secondary | ICD-10-CM | POA: Diagnosis not present

## 2019-07-02 DIAGNOSIS — I119 Hypertensive heart disease without heart failure: Secondary | ICD-10-CM | POA: Diagnosis not present

## 2019-07-02 DIAGNOSIS — I10 Essential (primary) hypertension: Secondary | ICD-10-CM | POA: Diagnosis not present

## 2019-07-02 DIAGNOSIS — M170 Bilateral primary osteoarthritis of knee: Secondary | ICD-10-CM | POA: Diagnosis not present

## 2019-07-02 DIAGNOSIS — E038 Other specified hypothyroidism: Secondary | ICD-10-CM | POA: Diagnosis not present

## 2019-07-02 DIAGNOSIS — I5032 Chronic diastolic (congestive) heart failure: Secondary | ICD-10-CM | POA: Diagnosis not present

## 2019-07-02 DIAGNOSIS — E890 Postprocedural hypothyroidism: Secondary | ICD-10-CM | POA: Diagnosis not present

## 2019-07-02 DIAGNOSIS — I4891 Unspecified atrial fibrillation: Secondary | ICD-10-CM | POA: Diagnosis not present

## 2019-07-02 DIAGNOSIS — G43009 Migraine without aura, not intractable, without status migrainosus: Secondary | ICD-10-CM | POA: Diagnosis not present

## 2019-07-02 DIAGNOSIS — J449 Chronic obstructive pulmonary disease, unspecified: Secondary | ICD-10-CM | POA: Diagnosis not present

## 2019-07-12 DIAGNOSIS — H04123 Dry eye syndrome of bilateral lacrimal glands: Secondary | ICD-10-CM | POA: Diagnosis not present

## 2019-07-24 ENCOUNTER — Other Ambulatory Visit: Payer: Self-pay

## 2019-07-24 ENCOUNTER — Ambulatory Visit (INDEPENDENT_AMBULATORY_CARE_PROVIDER_SITE_OTHER): Payer: Medicare Other | Admitting: Neurology

## 2019-07-24 DIAGNOSIS — R29898 Other symptoms and signs involving the musculoskeletal system: Secondary | ICD-10-CM

## 2019-07-24 DIAGNOSIS — R202 Paresthesia of skin: Secondary | ICD-10-CM

## 2019-07-24 DIAGNOSIS — G629 Polyneuropathy, unspecified: Secondary | ICD-10-CM

## 2019-07-24 DIAGNOSIS — R200 Anesthesia of skin: Secondary | ICD-10-CM

## 2019-07-24 DIAGNOSIS — E611 Iron deficiency: Secondary | ICD-10-CM

## 2019-07-24 DIAGNOSIS — G2581 Restless legs syndrome: Secondary | ICD-10-CM

## 2019-07-24 NOTE — Procedures (Signed)
[facility]  [address], [address]  [place], [place] [address] Tel: [phone] Fax:  [phone] Test Date:  07/24/2019  Patient: Natasha [name] DOB: 1932/11/05 Physician: Nita Sickle, DO  Sex: Female Height: 5\' 9"  Ref Phys: , D.O.  ID#: Shon Millet Temp: 33.0C Technician:    Patient Complaints: This is a 86 year old female with history of lumbar surgery at L4-5 and L5-S1 referred for evaluation of bilateral leg numbness.  NCV & EMG Findings: Extensive electrodiagnostic testing of the right lower extremity and additional studies of the left shows:  1. Bilateral sural and superficial peroneal sensory responses are within normal limits. 2. Bilateral peroneal (EDB) and tibial motor responses show reduced amplitude.  Bilateral peroneal motor responses at the tibialis anterior are within normal limits. 3. Bilateral tibial H reflex studies are within normal limits. 4. Chronic motor axonal loss changes are seen affecting bilateral tibialis anterior muscles, without accompanied active denervation.  Proximal and deep muscles were not tested as the patient is on anticoagulation therapy.  Impression: The electrophysiologic findings are consistent with a chronic sensorimotor axonal polyneuropathy affecting the lower extremities.   ___________________________ 85, DO    Nerve Conduction Studies Anti Sensory Summary Table   Stim Site NR Peak (ms) Norm Peak (ms) P-T Amp (V) Norm P-T Amp  Left Sup Peroneal Anti Sensory (Ant Lat Mall)  33C  12 cm NR  <4.6  >3  Right Sup Peroneal Anti Sensory (Ant Lat Mall)  33C  12 cm NR  <4.6  >3  Left Sural Anti Sensory (Lat Mall)  33C  Calf NR  <4.6  >3  Right Sural Anti Sensory (Lat Mall)  33C  Calf NR  <4.6  >3   Motor Summary Table   Stim Site NR Onset (ms) Norm Onset (ms) O-P Amp (mV) Norm O-P Amp Site1 Site2 Delta-0 (ms) Dist (cm) Vel (m/s) Norm Vel (m/s)  Left Peroneal Motor (Ext Dig Brev)  33C  Ankle     4.5 <6.0 1.5 >2.5 B Fib Ankle 8.9 37.0 42 >40  B Fib    13.4  1.2  Poplt B Fib 2.0 8.0 40 >40  Poplt    15.4  1.0         Right Peroneal Motor (Ext Dig Brev)  33C  Ankle    4.0 <6.0 0.9 >2.5 B Fib Ankle 9.2 38.0 41 >40  B Fib    13.2  0.9  Poplt B Fib 1.8 8.0 44 >40  Poplt    15.0  0.9         Left Peroneal TA Motor (Tib Ant)  33C  Fib Head    2.9 <4.5 3.8 >3 Poplit Fib Head 1.7 8.0 47 >40  Poplit    4.6  3.5         Right Peroneal TA Motor (Tib Ant)  33C  Fib Head    3.8 <4.5 3.1 >3 Poplit Fib Head 1.4 8.0 57 >40  Poplit    5.2  2.8         Left Tibial Motor (Abd Hall Brev)  33C  Ankle    4.3 <6.0 2.5 >4 Knee Ankle 10.9 45.0 41 >40  Knee    15.2  1.6         Right Tibial Motor (Abd Hall Brev)  33C  Ankle    5.4 <6.0 3.1 >4 Knee Ankle 10.1 44.0 44 >40  Knee    15.5  2.0  H Reflex Studies   NR H-Lat (ms) Lat Norm (ms) L-R H-Lat (ms)  Left Tibial (Gastroc)  33C     34.83 <35 1.36  Right Tibial (Gastroc)  33C     33.47 <35 1.36   EMG   Side Muscle Ins Act Fibs Psw Fasc Number Recrt Dur Dur. Amp Amp. Poly Poly. Comment  Right AntTibialis Nml Nml Nml Nml 1- Rapid Few 1+ Few 1+ Nml Nml N/A  Right Gastroc Nml Nml Nml Nml Nml Nml Nml Nml Nml Nml Nml Nml N/A  Right RectFemoris Nml Nml Nml Nml Nml Nml Nml Nml Nml Nml Nml Nml N/A  Right BicepsFemS Nml Nml Nml Nml Nml Nml Nml Nml Nml Nml Nml Nml N/A  Left BicepsFemS Nml Nml Nml Nml Nml Nml Nml Nml Nml Nml Nml Nml N/A  Left AntTibialis Nml Nml Nml Nml 1- Rapid Few 1+ Few 1+ Nml Nml N/A  Left Gastroc Nml Nml Nml Nml Nml Nml Nml Nml Nml Nml Nml Nml N/A  Left RectFemoris Nml Nml Nml Nml Nml Nml Nml Nml Nml Nml Nml Nml N/A      Waveforms:

## 2019-07-25 ENCOUNTER — Other Ambulatory Visit: Payer: Self-pay

## 2019-07-25 DIAGNOSIS — G629 Polyneuropathy, unspecified: Secondary | ICD-10-CM

## 2019-07-25 DIAGNOSIS — M170 Bilateral primary osteoarthritis of knee: Secondary | ICD-10-CM | POA: Diagnosis not present

## 2019-07-25 DIAGNOSIS — E038 Other specified hypothyroidism: Secondary | ICD-10-CM | POA: Diagnosis not present

## 2019-07-25 DIAGNOSIS — Z79899 Other long term (current) drug therapy: Secondary | ICD-10-CM | POA: Diagnosis not present

## 2019-07-25 NOTE — Progress Notes (Unsigned)
Labs ordered per Dr. Everlena Cooper.

## 2019-07-30 DIAGNOSIS — H26491 Other secondary cataract, right eye: Secondary | ICD-10-CM | POA: Diagnosis not present

## 2019-07-30 DIAGNOSIS — Z961 Presence of intraocular lens: Secondary | ICD-10-CM | POA: Diagnosis not present

## 2019-08-01 ENCOUNTER — Other Ambulatory Visit: Payer: Self-pay

## 2019-08-01 ENCOUNTER — Other Ambulatory Visit: Payer: Medicare Other

## 2019-08-01 DIAGNOSIS — M170 Bilateral primary osteoarthritis of knee: Secondary | ICD-10-CM | POA: Diagnosis not present

## 2019-08-01 DIAGNOSIS — M1711 Unilateral primary osteoarthritis, right knee: Secondary | ICD-10-CM | POA: Diagnosis not present

## 2019-08-01 DIAGNOSIS — M1712 Unilateral primary osteoarthritis, left knee: Secondary | ICD-10-CM | POA: Diagnosis not present

## 2019-08-01 DIAGNOSIS — G629 Polyneuropathy, unspecified: Secondary | ICD-10-CM | POA: Diagnosis not present

## 2019-08-05 ENCOUNTER — Other Ambulatory Visit: Payer: Self-pay | Admitting: Internal Medicine

## 2019-08-05 ENCOUNTER — Telehealth: Payer: Self-pay | Admitting: Internal Medicine

## 2019-08-05 DIAGNOSIS — E049 Nontoxic goiter, unspecified: Secondary | ICD-10-CM | POA: Diagnosis not present

## 2019-08-05 DIAGNOSIS — E063 Autoimmune thyroiditis: Secondary | ICD-10-CM

## 2019-08-05 DIAGNOSIS — I4891 Unspecified atrial fibrillation: Secondary | ICD-10-CM | POA: Diagnosis not present

## 2019-08-05 DIAGNOSIS — Z9889 Other specified postprocedural states: Secondary | ICD-10-CM

## 2019-08-05 DIAGNOSIS — E039 Hypothyroidism, unspecified: Secondary | ICD-10-CM | POA: Diagnosis not present

## 2019-08-05 LAB — PROTEIN ELECTROPHORESIS, SERUM
Albumin ELP: 4.2 g/dL (ref 3.8–4.8)
Alpha 1: 0.3 g/dL (ref 0.2–0.3)
Alpha 2: 0.7 g/dL (ref 0.5–0.9)
Beta 2: 0.5 g/dL (ref 0.2–0.5)
Beta Globulin: 0.5 g/dL (ref 0.4–0.6)
Gamma Globulin: 1.3 g/dL (ref 0.8–1.7)
Total Protein: 7.5 g/dL (ref 6.1–8.1)

## 2019-08-05 MED ORDER — VERAPAMIL HCL ER 120 MG PO TBCR: 120.0000 mg | EXTENDED_RELEASE_TABLET | Freq: Every day | ORAL | 3 refills | Status: DC | PRN

## 2019-08-05 NOTE — Telephone Encounter (Signed)
I spoke to the patient who called because her HR and BP have been fluctuating.  Her BP has been 132/78 and HR 122 bpm.  Her Verapamil is outdated from 2019 and I will refill for her and she will try that.  If it does not slow the HR as prescribed, she will call for an appointment to further evaluate.  She saw Dr Ladona Ridgel in February and all was well.

## 2019-08-05 NOTE — Telephone Encounter (Signed)
  STAT if HR is under 50 or over 120 (normal HR is 60-100 beats per minute)  1) What is your heart rate? 120  2) Do you have a log of your heart rate readings (document readings)? Pt said since Friday her HR has been over 100  3) Do you have any other symptoms? Pt said she's having issue with her HR and her BP has been fluctuating, currently her BP is at 132/78. She wanted to speak with a nurse to discuss.   Please call

## 2019-08-06 ENCOUNTER — Other Ambulatory Visit: Payer: Self-pay | Admitting: Family Medicine

## 2019-08-06 ENCOUNTER — Other Ambulatory Visit: Payer: Self-pay

## 2019-08-06 ENCOUNTER — Ambulatory Visit
Admission: RE | Admit: 2019-08-06 | Discharge: 2019-08-06 | Disposition: A | Discharge status: Home / Self Care | Payer: Medicare Other | Source: Ambulatory Visit | Attending: Internal Medicine | Admitting: Internal Medicine

## 2019-08-06 DIAGNOSIS — Z9889 Other specified postprocedural states: Secondary | ICD-10-CM

## 2019-08-06 DIAGNOSIS — E049 Nontoxic goiter, unspecified: Secondary | ICD-10-CM | POA: Diagnosis not present

## 2019-08-06 DIAGNOSIS — E063 Autoimmune thyroiditis: Secondary | ICD-10-CM

## 2019-08-06 LAB — IMMUNOFIXATION, SERUM
IgA/Immunoglobulin A, Serum: 431 mg/dL — ABNORMAL HIGH (ref 64–422)
IgG (Immunoglobin G), Serum: 1380 mg/dL (ref 586–1602)
IgM (Immunoglobulin M), Srm: 145 mg/dL (ref 26–217)

## 2019-08-07 DIAGNOSIS — H26492 Other secondary cataract, left eye: Secondary | ICD-10-CM | POA: Diagnosis not present

## 2019-08-08 ENCOUNTER — Telehealth: Payer: Self-pay | Admitting: Internal Medicine

## 2019-08-08 DIAGNOSIS — M1712 Unilateral primary osteoarthritis, left knee: Secondary | ICD-10-CM | POA: Diagnosis not present

## 2019-08-08 DIAGNOSIS — M170 Bilateral primary osteoarthritis of knee: Secondary | ICD-10-CM | POA: Diagnosis not present

## 2019-08-08 DIAGNOSIS — M1711 Unilateral primary osteoarthritis, right knee: Secondary | ICD-10-CM | POA: Diagnosis not present

## 2019-08-08 NOTE — Telephone Encounter (Deleted)
   Primary Cardiologist: Armanda Magic, MD  Chart reviewed as part of pre-operative protocol coverage. Given past medical history and time since last visit, based on ACC/AHA guidelines, Natasha [name] would be at acceptable risk for the planned procedure without further cardiovascular testing.   Patient with diagnosis of afib on Eliquis for anticoagulation.    Procedure: steroid injection Date of procedure: 08/13/19       CHADS2-VASc score of 5 (age x2, sex, CHF, HTN)  CrCl 23mL/min Platelet count 234  Per office protocol, patient can hold Eliquis for 2 days prior to procedure.  I will route this recommendation to the requesting party via Epic fax function and remove from pre-op pool.  Please call with questions.  Thomasene Ripple. Lanita Stammen NP-C    08/08/2019, 3:21 PM [facility] [address] Office [phone] Fax [phone]

## 2019-08-08 NOTE — Telephone Encounter (Signed)
I have s/w Darl Pikes with [facility] and have updated pt's procedure information. I stated that we had not received a clearance request from [facility]. I had stated that the pt had called and had given our office the information for her procedure 08/13/19. After speaking with Darl Pikes, surgery scheduler with Dr. Ethelene Hal with [facility] I have messaged our pre op team with the update information. See below:  Procedure: INJECTION INTO FACET JOINTS L4-L5, L5-S1 ANESTHESIA: WILL BE LIDOCAINE SPRAY MEDS TO BE HELD: NONE, INCLUDING HER ELIQUIS AS THIS INJECTION PER SUSAN IS GOING INTO THE FACET JOINT AND NOT LUMBAR FAX # : [phone]  I thanked Darl Pikes for her help in updating me with the correct information to give to the cardiologist, pre op team.

## 2019-08-08 NOTE — Telephone Encounter (Signed)
   Primary Cardiologist: Armanda Magic, MD  Chart reviewed as part of pre-operative protocol coverage. Given past medical history and time since last visit, based on ACC/AHA guidelines, Natasha [name] would be at acceptable risk for the planned procedure without further cardiovascular testing.   Patient with diagnosis of afib on Eliquis for anticoagulation.    Procedure: steroid injection Date of procedure: 08/13/19       CHADS2-VASc score of 5 (age x2, sex, CHF, HTN)  CrCl 27mL/min Platelet count 234  Per office protocol, patient can hold Eliquis for 2 days prior to procedure.  I will route this recommendation to the requesting party via Epic fax function and remove from pre-op pool.  Please call with questions.  Thomasene Ripple. Yee Joss NP-C    08/08/2019, 4:05 PM [facility] [address] Office [phone] Fax [phone]

## 2019-08-08 NOTE — Telephone Encounter (Signed)
Pt is getting a cortisol shot on Tuesday in her back. She wanted to know if it would be safe for her t     [facility] Pre-operative Risk Assessment    Request for surgical clearance:  1. What type of surgery is being performed? Cortisone Shot in Back  2. When is this surgery scheduled? 08-13-19  3. What type of clearance is required (medical clearance vs. Pharmacy clearance to hold med vs. Both)? both  4. Are there any medications that need to be held prior to surgery and how long?Eliquis, 3 days  5. Practice name and name of physician performing surgery? Dr. Nelva Bush, [facility]  6. What is your office phone number: [phone]   0.   What is your office fax number:   8.   Anesthesia type (None, local, MAC, general) ?    Patient called and wanted to know if it is safe for her to stop taking her eliquis. She also had issues of a rapid HR earlier in the week and wanted to know if her rapid HR would affect her getting the shot   Natasha [name] 08/08/2019, 1:57 PM  _________________________________________________________________   (provider comments below)

## 2019-08-08 NOTE — Telephone Encounter (Signed)
Patient with diagnosis of afib on Eliquis for anticoagulation.    Procedure: steroid injection Date of procedure: 08/13/19   CHADS2-VASc score of 5 (age x2, sex, CHF, HTN)  CrCl 42mL/min Platelet count 234  Per office protocol, patient can hold Eliquis for 2 days prior to procedure.

## 2019-08-13 DIAGNOSIS — M47816 Spondylosis without myelopathy or radiculopathy, lumbar region: Secondary | ICD-10-CM | POA: Diagnosis not present

## 2019-08-14 DIAGNOSIS — Z0000 Encounter for general adult medical examination without abnormal findings: Secondary | ICD-10-CM | POA: Diagnosis not present

## 2019-08-14 DIAGNOSIS — E039 Hypothyroidism, unspecified: Secondary | ICD-10-CM | POA: Diagnosis not present

## 2019-08-14 DIAGNOSIS — Z1389 Encounter for screening for other disorder: Secondary | ICD-10-CM | POA: Diagnosis not present

## 2019-08-14 DIAGNOSIS — M170 Bilateral primary osteoarthritis of knee: Secondary | ICD-10-CM | POA: Diagnosis not present

## 2019-08-14 DIAGNOSIS — J449 Chronic obstructive pulmonary disease, unspecified: Secondary | ICD-10-CM | POA: Diagnosis not present

## 2019-08-14 DIAGNOSIS — I4891 Unspecified atrial fibrillation: Secondary | ICD-10-CM | POA: Diagnosis not present

## 2019-08-14 DIAGNOSIS — I10 Essential (primary) hypertension: Secondary | ICD-10-CM | POA: Diagnosis not present

## 2019-08-14 DIAGNOSIS — G43009 Migraine without aura, not intractable, without status migrainosus: Secondary | ICD-10-CM | POA: Diagnosis not present

## 2019-08-14 DIAGNOSIS — I5032 Chronic diastolic (congestive) heart failure: Secondary | ICD-10-CM | POA: Diagnosis not present

## 2019-08-15 DIAGNOSIS — E890 Postprocedural hypothyroidism: Secondary | ICD-10-CM | POA: Diagnosis not present

## 2019-08-15 DIAGNOSIS — I10 Essential (primary) hypertension: Secondary | ICD-10-CM | POA: Diagnosis not present

## 2019-08-15 DIAGNOSIS — I119 Hypertensive heart disease without heart failure: Secondary | ICD-10-CM | POA: Diagnosis not present

## 2019-08-15 DIAGNOSIS — G43009 Migraine without aura, not intractable, without status migrainosus: Secondary | ICD-10-CM | POA: Diagnosis not present

## 2019-08-15 DIAGNOSIS — I4891 Unspecified atrial fibrillation: Secondary | ICD-10-CM | POA: Diagnosis not present

## 2019-08-15 DIAGNOSIS — J449 Chronic obstructive pulmonary disease, unspecified: Secondary | ICD-10-CM | POA: Diagnosis not present

## 2019-08-15 DIAGNOSIS — E038 Other specified hypothyroidism: Secondary | ICD-10-CM | POA: Diagnosis not present

## 2019-08-15 DIAGNOSIS — I5032 Chronic diastolic (congestive) heart failure: Secondary | ICD-10-CM | POA: Diagnosis not present

## 2019-08-15 DIAGNOSIS — M170 Bilateral primary osteoarthritis of knee: Secondary | ICD-10-CM | POA: Diagnosis not present

## 2019-08-15 DIAGNOSIS — E039 Hypothyroidism, unspecified: Secondary | ICD-10-CM | POA: Diagnosis not present

## 2019-08-30 ENCOUNTER — Ambulatory Visit: Payer: Medicare Other | Admitting: Neurology

## 2019-09-04 DIAGNOSIS — M5136 Other intervertebral disc degeneration, lumbar region: Secondary | ICD-10-CM | POA: Diagnosis not present

## 2019-09-04 DIAGNOSIS — M51369 Other intervertebral disc degeneration, lumbar region without mention of lumbar back pain or lower extremity pain: Secondary | ICD-10-CM | POA: Insufficient documentation

## 2019-09-09 DIAGNOSIS — E039 Hypothyroidism, unspecified: Secondary | ICD-10-CM | POA: Diagnosis not present

## 2019-09-09 DIAGNOSIS — I10 Essential (primary) hypertension: Secondary | ICD-10-CM | POA: Diagnosis not present

## 2019-09-18 DIAGNOSIS — M1711 Unilateral primary osteoarthritis, right knee: Secondary | ICD-10-CM | POA: Diagnosis not present

## 2019-09-18 DIAGNOSIS — M170 Bilateral primary osteoarthritis of knee: Secondary | ICD-10-CM | POA: Diagnosis not present

## 2019-09-19 DIAGNOSIS — M5136 Other intervertebral disc degeneration, lumbar region: Secondary | ICD-10-CM | POA: Diagnosis not present

## 2019-10-07 NOTE — Progress Notes (Signed)
NEUROLOGY FOLLOW UP OFFICE NOTE  Natasha [name] [id number]  HISTORY OF PRESENT ILLNESS: Natasha [name]. Circle is an 86 year old white female with HTN, PAF, chronic diastolic heart failure, DJD, hypothyroidism, and restless leg syndrome who follows up for bilateral lower extremity weakness and restless leg syndrome.  UPDATE: Current medications:  Pramipexole 0.125mg  late afternoon and 0.25mg  2-3 hours prior to bedtime.   B12 346, folate 12.7, TSH 1.09, CK 54, aldolase 5.1, ferritin 65.  SPEP/IFE from 08/01/2019 showed polyclonal increase in IgA but no M-spike.  Labs from 05/31/2019 showed  NCV-EMG of lower extremities on 07/24/2019 demonstrated a chronic sensorimotor axonal polyneuropathy.  HISTORY:  She has had a very gradual onset of lower extremity weakness beginning around 2016 or 2017.  She feels wobbly on her feet.  Over the past year, she no longer is able to walk up steps with her two feet.  She needs to pull herself, either grabbing the bannister or bending over and climbing up the steps on all fours.  Her legs ache.  She was evaluated by pain management for low back and left hip pain.  She has an MRI of the lumbar spine without contrast on 05/02/2018, which was personally reviewed, and showed multilevel lumbar spondylosis and degenerative endplate marrow edema at L1-L2 with left lateral disc bulge and spurring and mild left L1 foraminal stenosis as well as edema in the medial left psoas muscle at the L1-L2 level.  She was told surgery wasn't an option.      She presented to the ED on 04/07/2019 following a fall in which she struck the back of her head, sustaining a contusion.  CT of head and cervical spine personally reviewed showed no evidence of trauma or other acute abnormality.  No associated dizziness or passing out.  She also reports numbness and tingling involving her fingers and toes.  She feels shooting pain in the right ring finger and left middle finger.  She reports spurs in her neck  but denies any significant neck pain.  No weakness in the upper extremities.  She has restless leg syndrome which has gotten worse over the past several months.  It feels like that her legs are jumping.  She needs to keep moving her legs for relief.  She is starting to notice it earlier in the evening while sitting and watching TV.  She was started on pramipexole 0.125mg  2 to 3 hours before bedtime which initially was helpful but then needed to increase to 0.25mg .  PAST MEDICAL HISTORY: Past Medical History:  Diagnosis Date  . Chronic diastolic heart failure (HCC) 04/22/2015  . Complication of anesthesia   . Diverticular disease   . DJD (degenerative joint disease)   . Dysphagia   . GERD (gastroesophageal reflux disease)   . Hiatal hernia   . Hypertension   . Hypothyroidism   . Lower leg edema   . PAF (paroxysmal atrial fibrillation) (HCC)    s/p ablation x several times now with occasional episodes of PAF.   She has failed sotolol/dofetilide/flecainide/propafenone/Amio and dronedarone either with not controlling her PAF or having side effects  . PONV (postoperative nausea and vomiting)   . Restless leg syndrome   . Seasonal allergies     MEDICATIONS: Current Outpatient Medications on File Prior to Visit  Medication Sig Dispense Refill  . ELIQUIS 5 MG TABS tablet TAKE 1 TABLET BY MOUTH 2 TIMES DAILY. 60 tablet 6  . esomeprazole (NEXIUM) 20 MG capsule Take 20 mg  by mouth every 3 (three) days.     Marland Kitchen estradiol (CLIMARA - DOSED IN MG/24 HR) 0.025 mg/24hr patch Place 0.025 mg onto the skin once a week.    . furosemide (LASIX) 40 MG tablet Take 40 mg by mouth daily.    Marland Kitchen levothyroxine (SYNTHROID, LEVOTHROID) 125 MCG tablet Take 150 mcg by mouth daily before breakfast. BRAND ONLY     . LORazepam (ATIVAN) 1 MG tablet Take 0.5-1 mg by mouth at bedtime as needed for sleep. For sleep    . Magnesium 500 MG CAPS Take 500 mg by mouth daily.    . potassium chloride (K-DUR) 10 MEQ tablet TAKE 1 TABLET  BY MOUTH DAILY 30 tablet 11  . pramipexole (MIRAPEX) 0.125 MG tablet Take 3 tablets by mouth daily.    . Riboflavin 400 MG CAPS Take 1 capsule by mouth daily.    Marland Kitchen SYNTHROID 150 MCG tablet Take 150 mcg by mouth daily.    . verapamil (CALAN-SR) 120 MG CR tablet Take 1 tablet (120 mg total) by mouth daily as needed (for rapid heart rate). 60 tablet 3  . Vitamin D, Cholecalciferol, 50 MCG (2000 UT) CAPS Take 1 capsule by mouth daily.     No current facility-administered medications on file prior to visit.    ALLERGIES: Allergies  Allergen Reactions  . Codeine Nausea And Vomiting and Nausea Only  . Penicillins Hives    Has patient had a PCN reaction causing immediate rash, facial/tongue/throat swelling, SOB or lightheadedness with hypotension: No Has patient had a PCN reaction causing severe rash involving mucus membranes or skin necrosis: No Has patient had a PCN reaction that required hospitalization No Has patient had a PCN reaction occurring within the last 10 years: No If all of the above answers are "NO", then may proceed with Cephalosporin use.  . Tetracycline Rash  . Tramadol Nausea Only  . Digoxin Other (See Comments)    Flushed neck and face  . Digoxin And Related Other (See Comments)    ' Makes my face blood red"  . Erythromycin Other (See Comments) and Hives    Flushing on face  . Other Other (See Comments)    ECG leads cause skin irritation. ECG leads cause skin irritation.  . Sulfa Antibiotics Other (See Comments)    PRESERVATIVE OF SOME FOODS- MAKES FLUSHES TO FACE AND NECK  . Tetracyclines & Related Other (See Comments)    "Sores on legs"  . Diltiazem Rash and Other (See Comments)    Rash with long acting form  . Diltiazem Hcl Rash    Rash with long acting form  . Sulfites Rash    PRESERVATIVE OF SOME FOODS- MAKES FLUSHES TO FACE AND NECK PRESERVATIVE OF SOME FOODS- MAKES FLUSHES TO FACE AND NECK    FAMILY HISTORY: Family History  Problem Relation Age of  Onset  . Transient ischemic attack Mother   . Heart failure Father   . CVA Father   . Lung cancer Father   . Hypertension Sister   . Cancer Brother   . Sudden death Brother   . Anesthesia problems Neg Hx   . Hypotension Neg Hx   . Malignant hyperthermia Neg Hx   . Pseudochol deficiency Neg Hx   . Heart attack Neg Hx     SOCIAL HISTORY: Social History   Socioeconomic History  . Marital status: Married    Spouse name: Not on file  . Number of children: Not on file  . Years of  education: Not on file  . Highest education level: Not on file  Occupational History  . Occupation: retired  Tobacco Use  . Smoking status: Never Smoker  . Smokeless tobacco: Never Used  Vaping Use  . Vaping Use: Never used  Substance and Sexual Activity  . Alcohol use: Yes    Alcohol/week: 0.0 standard drinks    Comment: rare wine  . Drug use: No  . Sexual activity: Never    Birth control/protection: Post-menopausal  Other Topics Concern  . Not on file  Social History Narrative   Right handed   Two story home   Social Determinants of Health   Financial Resource Strain:   . Difficulty of Paying Living Expenses:   Food Insecurity:   . Worried About Programme researcher, broadcasting/film/video in the Last Year:   . Barista in the Last Year:   Transportation Needs:   . Freight forwarder (Medical):   Marland Kitchen Lack of Transportation (Non-Medical):   Physical Activity:   . Days of Exercise per Week:   . Minutes of Exercise per Session:   Stress:   . Feeling of Stress :   Social Connections:   . Frequency of Communication with Friends and Family:   . Frequency of Social Gatherings with Friends and Family:   . Attends Religious Services:   . Active Member of Clubs or Organizations:   . Attends Banker Meetings:   Marland Kitchen Marital Status:   Intimate Partner Violence:   . Fear of Current or Ex-Partner:   . Emotionally Abused:   Marland Kitchen Physically Abused:   . Sexually Abused:    PHYSICAL EXAM: Blood  pressure (!) 156/79, pulse 72, resp. rate 18, height 5\' 9"  (1.753 m), weight 201 lb (91.2 kg), SpO2 98 %. General: No acute distress.  Patient appears well-groomed.   Head:  Normocephalic/atraumatic Eyes:  Fundi examined but not visualized Neck: supple, no paraspinal tenderness, full range of motion Heart:  Regular rate and rhythm Lungs:  Clear to auscultation bilaterally Back: No paraspinal tenderness Neurological Exam: alert and oriented to person, place, and time. Attention span and concentration intact, recent and remote memory intact, fund of knowledge intact.  Speech fluent and not dysarthric, language intact.  CN II-XII intact. Bulk and tone normal, muscle strength 4+/5 bilateral hip flexion, otherwise 5/5.  Deep tendon reflexes 2+ throughout, toes downgoing.  Finger to nose testing intact.  Wobbling gait.  Romberg negative.  IMPRESSION: 1.  Bilateral lower extremity weakness, multifactorial, secondary to osteoarthritis in knees, lumbar spondylosis, and idiopathic polyneuropathy. 2.  Restless leg syndrome  PLAN: 1.  Continue current regimen of pramipexole 0.125mg  late afternoon and 0.125 to 0.25mg  2-3 hours prior to bedtime 2.  Follow up in 5 months.  Marland Kitchen, DO  CC: Shon Millet, MD

## 2019-10-07 NOTE — Progress Notes (Deleted)
NEUROLOGY FOLLOW UP OFFICE NOTE  Natasha [name] [id number]  HISTORY OF PRESENT ILLNESS: Natasha [name]. Piccini is an 86 year old white female with HTN, PAF, chronic diastolic heart failure, DJD, hypothyroidism, and restless leg syndrome who follows up for bilateral lower extremity weakness and restless leg syndrome.  UPDATE: Current medications:  Pramipexole 0.125mg  late afternoon and 0.25mg  2-3 hours prior to bedtime. B12 346, folate 12.7, TSH 1.09, CK 54, aldolase 5.1, ferritin 65.  SPEP/IFE from 08/01/2019 showed polyclonal increase in IgA but no M-spike.  Labs from 05/31/2019 showed  NCV-EMG of lower extremities on 07/24/2019 demonstrated a chronic sensorimotor axonal polyneuropathy.  HISTORY:  She has had a very gradual onset of lower extremity weakness beginning around 2016 or 2017.  She feels wobbly on her feet.  Over the past year, she no longer is able to walk up steps with her two feet.  She needs to pull herself, either grabbing the bannister or bending over and climbing up the steps on all fours.  Her legs ache.  She was evaluated by pain management for low back and left hip pain.  She has an MRI of the lumbar spine without contrast on 05/02/2018, which was personally reviewed, and showed multilevel lumbar spondylosis and degenerative endplate marrow edema at L1-L2 with left lateral disc bulge and spurring and mild left L1 foraminal stenosis as well as edema in the medial left psoas muscle at the L1-L2 level.  She was told surgery wasn't an option.      She presented to the ED on 04/07/2019 following a fall in which she struck the back of her head, sustaining a contusion.  CT of head and cervical spine personally reviewed showed no evidence of trauma or other acute abnormality.  No associated dizziness or passing out.  She also reports numbness and tingling involving her fingers and toes.  She feels shooting pain in the right ring finger and left middle finger.  She reports spurs in her neck but  denies any significant neck pain.  No weakness in the upper extremities.  She has restless leg syndrome which has gotten worse over the past several months.  It feels like that her legs are jumping.  She needs to keep moving her legs for relief.  She is starting to notice it earlier in the evening while sitting and watching TV.  She was started on pramipexole 0.125mg  2 to 3 hours before bedtime which initially was helpful but then needed to increase to 0.25mg .  PAST MEDICAL HISTORY: Past Medical History:  Diagnosis Date  . Chronic diastolic heart failure (HCC) 04/22/2015  . Complication of anesthesia   . Diverticular disease   . DJD (degenerative joint disease)   . Dysphagia   . GERD (gastroesophageal reflux disease)   . Hiatal hernia   . Hypertension   . Hypothyroidism   . Lower leg edema   . PAF (paroxysmal atrial fibrillation) (HCC)    s/p ablation x several times now with occasional episodes of PAF.   She has failed sotolol/dofetilide/flecainide/propafenone/Amio and dronedarone either with not controlling her PAF or having side effects  . PONV (postoperative nausea and vomiting)   . Restless leg syndrome   . Seasonal allergies     MEDICATIONS: Current Outpatient Medications on File Prior to Visit  Medication Sig Dispense Refill  . ELIQUIS 5 MG TABS tablet TAKE 1 TABLET BY MOUTH 2 TIMES DAILY. 60 tablet 6  . esomeprazole (NEXIUM) 20 MG capsule Take 20 mg by mouth  every 3 (three) days.     Marland Kitchen estradiol (CLIMARA - DOSED IN MG/24 HR) 0.025 mg/24hr patch Place 0.025 mg onto the skin once a week.    . furosemide (LASIX) 40 MG tablet Take 40 mg by mouth daily.    Marland Kitchen levothyroxine (SYNTHROID, LEVOTHROID) 125 MCG tablet Take 150 mcg by mouth daily before breakfast. BRAND ONLY     . LORazepam (ATIVAN) 1 MG tablet Take 0.5-1 mg by mouth at bedtime as needed for sleep. For sleep    . Magnesium 500 MG CAPS Take 500 mg by mouth daily.    . potassium chloride (K-DUR) 10 MEQ tablet TAKE 1 TABLET BY  MOUTH DAILY 30 tablet 11  . pramipexole (MIRAPEX) 0.125 MG tablet Take 3 tablets by mouth daily.    . Riboflavin 400 MG CAPS Take 1 capsule by mouth daily.    Marland Kitchen SYNTHROID 150 MCG tablet Take 150 mcg by mouth daily.    . verapamil (CALAN-SR) 120 MG CR tablet Take 1 tablet (120 mg total) by mouth daily as needed (for rapid heart rate). 60 tablet 3  . Vitamin D, Cholecalciferol, 50 MCG (2000 UT) CAPS Take 1 capsule by mouth daily.     No current facility-administered medications on file prior to visit.    ALLERGIES: Allergies  Allergen Reactions  . Codeine Nausea And Vomiting and Nausea Only  . Penicillins Hives    Has patient had a PCN reaction causing immediate rash, facial/tongue/throat swelling, SOB or lightheadedness with hypotension: No Has patient had a PCN reaction causing severe rash involving mucus membranes or skin necrosis: No Has patient had a PCN reaction that required hospitalization No Has patient had a PCN reaction occurring within the last 10 years: No If all of the above answers are "NO", then may proceed with Cephalosporin use.  . Tetracycline Rash  . Tramadol Nausea Only  . Digoxin Other (See Comments)    Flushed neck and face  . Digoxin And Related Other (See Comments)    ' Makes my face blood red"  . Erythromycin Other (See Comments) and Hives    Flushing on face  . Other Other (See Comments)    ECG leads cause skin irritation. ECG leads cause skin irritation.  . Sulfa Antibiotics Other (See Comments)    PRESERVATIVE OF SOME FOODS- MAKES FLUSHES TO FACE AND NECK  . Tetracyclines & Related Other (See Comments)    "Sores on legs"  . Diltiazem Rash and Other (See Comments)    Rash with long acting form  . Diltiazem Hcl Rash    Rash with long acting form  . Sulfites Rash    PRESERVATIVE OF SOME FOODS- MAKES FLUSHES TO FACE AND NECK PRESERVATIVE OF SOME FOODS- MAKES FLUSHES TO FACE AND NECK    FAMILY HISTORY: Family History  Problem Relation Age of Onset    . Transient ischemic attack Mother   . Heart failure Father   . CVA Father   . Lung cancer Father   . Hypertension Sister   . Cancer Brother   . Sudden death Brother   . Anesthesia problems Neg Hx   . Hypotension Neg Hx   . Malignant hyperthermia Neg Hx   . Pseudochol deficiency Neg Hx   . Heart attack Neg Hx    ***.  SOCIAL HISTORY: Social History   Socioeconomic History  . Marital status: Married    Spouse name: Not on file  . Number of children: Not on file  . Years of  education: Not on file  . Highest education level: Not on file  Occupational History  . Occupation: retired  Tobacco Use  . Smoking status: Never Smoker  . Smokeless tobacco: Never Used  Vaping Use  . Vaping Use: Never used  Substance and Sexual Activity  . Alcohol use: Yes    Alcohol/week: 0.0 standard drinks    Comment: rare wine  . Drug use: No  . Sexual activity: Never    Birth control/protection: Post-menopausal  Other Topics Concern  . Not on file  Social History Narrative   Right handed   Two story home   Social Determinants of Health   Financial Resource Strain:   . Difficulty of Paying Living Expenses:   Food Insecurity:   . Worried About Programme researcher, broadcasting/film/video in the Last Year:   . Barista in the Last Year:   Transportation Needs:   . Freight forwarder (Medical):   Marland Kitchen Lack of Transportation (Non-Medical):   Physical Activity:   . Days of Exercise per Week:   . Minutes of Exercise per Session:   Stress:   . Feeling of Stress :   Social Connections:   . Frequency of Communication with Friends and Family:   . Frequency of Social Gatherings with Friends and Family:   . Attends Religious Services:   . Active Member of Clubs or Organizations:   . Attends Banker Meetings:   Marland Kitchen Marital Status:   Intimate Partner Violence:   . Fear of Current or Ex-Partner:   . Emotionally Abused:   Marland Kitchen Physically Abused:   . Sexually Abused:     REVIEW OF  SYSTEMS: Constitutional: No fevers, chills, or sweats, no generalized fatigue, change in appetite Eyes: No visual changes, double vision, eye pain Ear, nose and throat: No hearing loss, ear pain, nasal congestion, sore throat Cardiovascular: No chest pain, palpitations Respiratory:  No shortness of breath at rest or with exertion, wheezes GastrointestinaI: No nausea, vomiting, diarrhea, abdominal pain, fecal incontinence Genitourinary:  No dysuria, urinary retention or frequency Musculoskeletal:  No neck pain, back pain Integumentary: No rash, pruritus, skin lesions Neurological: as above Psychiatric: No depression, insomnia, anxiety Endocrine: No palpitations, fatigue, diaphoresis, mood swings, change in appetite, change in weight, increased thirst Hematologic/Lymphatic:  No purpura, petechiae. Allergic/Immunologic: no itchy/runny eyes, nasal congestion, recent allergic reactions, rashes  PHYSICAL EXAM: *** General: No acute distress.  Patient appears ***-groomed.   Head:  Normocephalic/atraumatic Eyes:  Fundi examined but not visualized Neck: supple, no paraspinal tenderness, full range of motion Heart:  Regular rate and rhythm Lungs:  Clear to auscultation bilaterally Back: No paraspinal tenderness Neurological Exam: alert and oriented to person, place, and time. Attention span and concentration intact, recent and remote memory intact, fund of knowledge intact.  Speech fluent and not dysarthric, language intact.  CN II-XII intact. Bulk and tone normal, muscle strength 5/5 throughout.  Sensation to light touch, temperature and vibration intact.  Deep tendon reflexes 2+ throughout, toes downgoing.  Finger to nose and heel to shin testing intact.  Gait normal, Romberg negative.  IMPRESSION: ***  PLAN: ***  Shon Millet, DO  CC: ***

## 2019-10-08 ENCOUNTER — Encounter: Payer: Self-pay | Admitting: Neurology

## 2019-10-08 ENCOUNTER — Other Ambulatory Visit: Payer: Self-pay | Admitting: Internal Medicine

## 2019-10-08 ENCOUNTER — Other Ambulatory Visit: Payer: Self-pay

## 2019-10-08 ENCOUNTER — Ambulatory Visit: Payer: Medicare Other | Admitting: Neurology

## 2019-10-08 ENCOUNTER — Ambulatory Visit (INDEPENDENT_AMBULATORY_CARE_PROVIDER_SITE_OTHER): Payer: Medicare Other | Admitting: Neurology

## 2019-10-08 VITALS — BP 156/79 | HR 72 | Resp 18 | Ht 69.0 in | Wt 201.0 lb

## 2019-10-08 DIAGNOSIS — R29898 Other symptoms and signs involving the musculoskeletal system: Secondary | ICD-10-CM | POA: Diagnosis not present

## 2019-10-08 DIAGNOSIS — G2581 Restless legs syndrome: Secondary | ICD-10-CM | POA: Diagnosis not present

## 2019-10-08 DIAGNOSIS — G629 Polyneuropathy, unspecified: Secondary | ICD-10-CM | POA: Diagnosis not present

## 2019-10-08 NOTE — Patient Instructions (Signed)
1.  No change to pramipexole for now.  Contact me if you think we need to make changes 2.  Follow up in 5 months.

## 2019-10-08 NOTE — Telephone Encounter (Signed)
Prescription refill request for Eliquis received.  Last office visit: 06/11/19 Scr: 0.73 Age: 86 Weight: 90.8 kg  Prescription refill sent 6/22

## 2019-10-16 ENCOUNTER — Other Ambulatory Visit: Payer: Self-pay

## 2019-10-16 ENCOUNTER — Ambulatory Visit
Admission: RE | Admit: 2019-10-16 | Discharge: 2019-10-16 | Disposition: A | Discharge status: Home / Self Care | Payer: Medicare Other | Source: Ambulatory Visit | Attending: Internal Medicine | Admitting: Internal Medicine

## 2019-10-16 ENCOUNTER — Ambulatory Visit: Payer: Medicare Other | Admitting: Neurology

## 2019-10-16 DIAGNOSIS — Z1231 Encounter for screening mammogram for malignant neoplasm of breast: Secondary | ICD-10-CM

## 2019-10-23 DIAGNOSIS — M170 Bilateral primary osteoarthritis of knee: Secondary | ICD-10-CM | POA: Diagnosis not present

## 2019-10-23 DIAGNOSIS — I10 Essential (primary) hypertension: Secondary | ICD-10-CM | POA: Diagnosis not present

## 2019-10-23 DIAGNOSIS — I5032 Chronic diastolic (congestive) heart failure: Secondary | ICD-10-CM | POA: Diagnosis not present

## 2019-10-23 DIAGNOSIS — I4891 Unspecified atrial fibrillation: Secondary | ICD-10-CM | POA: Diagnosis not present

## 2019-10-23 DIAGNOSIS — E039 Hypothyroidism, unspecified: Secondary | ICD-10-CM | POA: Diagnosis not present

## 2019-10-23 DIAGNOSIS — J449 Chronic obstructive pulmonary disease, unspecified: Secondary | ICD-10-CM | POA: Diagnosis not present

## 2019-10-23 DIAGNOSIS — E038 Other specified hypothyroidism: Secondary | ICD-10-CM | POA: Diagnosis not present

## 2019-10-23 DIAGNOSIS — G43009 Migraine without aura, not intractable, without status migrainosus: Secondary | ICD-10-CM | POA: Diagnosis not present

## 2019-10-23 DIAGNOSIS — E890 Postprocedural hypothyroidism: Secondary | ICD-10-CM | POA: Diagnosis not present

## 2019-10-23 DIAGNOSIS — I119 Hypertensive heart disease without heart failure: Secondary | ICD-10-CM | POA: Diagnosis not present

## 2019-10-24 ENCOUNTER — Ambulatory Visit: Payer: Medicare Other | Admitting: Neurology

## 2019-10-31 DIAGNOSIS — M5136 Other intervertebral disc degeneration, lumbar region: Secondary | ICD-10-CM | POA: Diagnosis not present

## 2019-11-14 ENCOUNTER — Ambulatory Visit (INDEPENDENT_AMBULATORY_CARE_PROVIDER_SITE_OTHER): Payer: Medicare Other | Admitting: Cardiology

## 2019-11-14 ENCOUNTER — Other Ambulatory Visit: Payer: Self-pay

## 2019-11-14 ENCOUNTER — Encounter: Payer: Self-pay | Admitting: Cardiology

## 2019-11-14 VITALS — BP 132/76 | HR 87 | Ht 69.0 in | Wt 199.0 lb

## 2019-11-14 DIAGNOSIS — I4819 Other persistent atrial fibrillation: Secondary | ICD-10-CM | POA: Diagnosis not present

## 2019-11-14 DIAGNOSIS — I10 Essential (primary) hypertension: Secondary | ICD-10-CM | POA: Diagnosis not present

## 2019-11-14 DIAGNOSIS — I5032 Chronic diastolic (congestive) heart failure: Secondary | ICD-10-CM | POA: Diagnosis not present

## 2019-11-14 NOTE — Addendum Note (Signed)
Addended by: Theresia Majors on: 11/14/2019 01:29 PM   Modules accepted: Orders

## 2019-11-14 NOTE — Progress Notes (Signed)
Cardiology Office Note   Date:  11/14/2019   ID:  Natasha [name], DOB 17-Oct-1932, MRN [id number]  PCP:  Marden Noble, MD  Cardiologist:  Armanda Magic, MD  Electrophysiologist:  None   Chief Complaint:  Atrial fibrillation  History of Present Illness:    Natasha [name] is a 86 y.o. female with a hx of [facility] and multiple antiarrythmic intolerances(Multaq , Rythmol, flecaide, tikosyn, and pt refused amiodarone) , HTN, hypothyroidism, mildpulmonary hypertension(74mmHg)and chronic diastolic dysfunction.   She has been seen at [facility] as well as in our office by Dr. Ladona Ridgel with EP who recommended3 options-->continue current course and prn bystolic 5 mg, begin amiodarone (which she does not wish to do) and AV node ablaltion with PPM. She chose tocontinue current meds with  verapmil, bystolic and prn bystolic. She was seen back by Dr. Ladona Ridgel and was having fatigue that was felt to possibly be due to her afib meds and it was recommended that her evening dose of Bystolic be stopped but she decided not to stop it.  Ultimately, Amio was stopped due to side effects.    She is here today for followup and is doing well.  She denies any chest pain or pressure, PND, orthopnea, LE edema, dizziness or syncope. She has chronic mild DOE that is stable.  Last week she had 3 episodes of palpitations with HR up to 160's but had a steroid injection in her back the week prior.  She has an apple iwatch that keeps track of her HR and her last strip showed NSR with atrial couplets.  She is compliant with her meds and is tolerating meds with no SE.    Prior CV studies:   The following studies were reviewed today: none  Past Medical History:  Diagnosis Date  . Chronic diastolic heart failure (HCC) 04/22/2015  . Complication of anesthesia   . Diverticular disease   . DJD (degenerative joint disease)   . Dysphagia   . GERD (gastroesophageal reflux disease)    . Hiatal hernia   . Hypertension   . Hypothyroidism   . Lower leg edema   . PAF (paroxysmal atrial fibrillation) (HCC)    s/p ablation x several times now with occasional episodes of PAF.   She has failed sotolol/dofetilide/flecainide/propafenone/Amio and dronedarone either with not controlling her PAF or having side effects  . PONV (postoperative nausea and vomiting)   . Restless leg syndrome   . Seasonal allergies    Past Surgical History:  Procedure Laterality Date  . ABDOMINAL HYSTERECTOMY    . APPENDECTOMY    . ATRIAL FIBRILLATION ABLATION  09-06-13   2'14 -[facility]  . BALLOON DILATION  03/03/2011   Procedure: BALLOON DILATION;  Surgeon: Charolett Bumpers, MD;  Location: [facility] ENDOSCOPY;  Service: Endoscopy;  Laterality: N/A;  . bladder polyps    . BREAST EXCISIONAL BIOPSY Left   . BREAST SURGERY     left lumpectomy  . CARDIOVERSION     x3  . CARDIOVERSION N/A 12/30/2014   Procedure: CARDIOVERSION;  Surgeon: Lewayne Bunting, MD;  Location: [facility] ENDOSCOPY;  Service: Cardiovascular;  Laterality: N/A;  . CARDIOVERSION N/A 04/23/2015   Procedure: CARDIOVERSION;  Surgeon: Thurmon Fair, MD;  Location: [facility] ENDOSCOPY;  Service: Cardiovascular;  Laterality: N/A;  . CATARACT EXTRACTION Left   . COLONOSCOPY WITH PROPOFOL N/A 09/24/2013   Procedure: COLONOSCOPY WITH PROPOFOL;  Surgeon: Charolett Bumpers, MD;  Location: [facility] ENDOSCOPY;  Service: Endoscopy;  Laterality: N/A;  . ESOPHAGOGASTRODUODENOSCOPY  03/03/2011   Procedure: ESOPHAGOGASTRODUODENOSCOPY (EGD);  Surgeon: Charolett Bumpers, MD;  Location: [facility] ENDOSCOPY;  Service: Endoscopy;  Laterality: N/A;  . EYE SURGERY     cataracat  . RHINOPLASTY    . RIGHT HEART CATHETERIZATION N/A 07/11/2014   Procedure: RIGHT HEART CATH;  Surgeon: Laurey Morale, MD;  Location: [facility] CATH LAB;  Service: Cardiovascular;  Laterality: N/A;  . ROTATOR CUFF REPAIR    . THYROIDECTOMY  1973  . TONSILLECTOMY  1942  . TUBAL LIGATION       Current Meds    Medication Sig  . ELIQUIS 5 MG TABS tablet TAKE 1 TABLET BY MOUTH 2 TIMES DAILY.  Marland Kitchen esomeprazole (NEXIUM) 20 MG capsule Take 20 mg by mouth every 3 (three) days.   Marland Kitchen estradiol (CLIMARA - DOSED IN MG/24 HR) 0.025 mg/24hr patch Place 0.025 mg onto the skin once a week.  . furosemide (LASIX) 40 MG tablet Take 40 mg by mouth daily.  Marland Kitchen LORazepam (ATIVAN) 1 MG tablet Take 0.5-1 mg by mouth at bedtime as needed for sleep. For sleep  . Magnesium 500 MG CAPS Take 500 mg by mouth daily.  . Methylcobalamin (B-12) 1000 MCG TBDP Take by mouth.  . potassium chloride (K-DUR) 10 MEQ tablet TAKE 1 TABLET BY MOUTH DAILY  . pramipexole (MIRAPEX) 0.125 MG tablet Take 3 tablets by mouth daily.  . Riboflavin 400 MG CAPS Take 1 capsule by mouth daily.  Marland Kitchen SYNTHROID 150 MCG tablet Take 150 mcg by mouth daily.  . verapamil (CALAN-SR) 120 MG CR tablet Take 1 tablet (120 mg total) by mouth daily as needed (for rapid heart rate).  . Vitamin D, Cholecalciferol, 50 MCG (2000 UT) CAPS Take 1 capsule by mouth daily.     Allergies:   Codeine, Penicillins, Tetracycline, Tramadol, Digoxin, Digoxin and related, Erythromycin, Other, Sulfa antibiotics, Tetracyclines & related, Diltiazem, Diltiazem hcl, and Sulfites   Social History   Tobacco Use  . Smoking status: Never Smoker  . Smokeless tobacco: Never Used  Vaping Use  . Vaping Use: Never used  Substance Use Topics  . Alcohol use: Yes    Alcohol/week: 0.0 standard drinks    Comment: rare wine  . Drug use: No     Family Hx: The patient's family history includes CVA in her father; Cancer in her brother; Heart failure in her father; Hypertension in her sister; Lung cancer in her father; Sudden death in her brother; Transient ischemic attack in her mother. There is no history of Anesthesia problems, Hypotension, Malignant hyperthermia, Pseudochol deficiency, or Heart attack.  ROS:   Please see the history of present illness.     All other systems reviewed and are  negative.   Labs/Other Tests and Data Reviewed:    Recent Labs: 02/08/2019: ALT 13; BUN 11; Creatinine, Ser 0.73; Potassium 4.4; Sodium 134 05/31/2019: TSH 1.09   Recent Lipid Panel No results found for: CHOL, TRIG, HDL, CHOLHDL, LDLCALC, LDLDIRECT  Wt Readings from Last 3 Encounters:  11/14/19 199 lb (90.3 kg)  10/08/19 201 lb (91.2 kg)  06/11/19 200 lb 3.2 oz (90.8 kg)     Objective:    Vital Signs:  BP (!) 132/76 (BP Location: Right Arm, Patient Position: Sitting, Cuff Size: Normal)   Pulse 87   Ht 5\' 9"  (1.753 m)   Wt 199 lb (90.3 kg)   SpO2 97%   BMI 29.39 kg/m    GEN: Well nourished, well developed in no acute  distress HEENT: Normal NECK: No JVD; No carotid bruits LYMPHATICS: No lymphadenopathy CARDIAC:RRR, no murmurs, rubs, gallops  occasional ectopy RESPIRATORY:  Clear to auscultation without rales, wheezing or rhonchi  ABDOMEN: Soft, non-tender, non-distended MUSCULOSKELETAL:  No edema; No deformity  SKIN: Warm and dry NEUROLOGIC:  Alert and oriented x 3 PSYCHIATRIC:  Normal affect    ASSESSMENT & PLAN:    1.  Chronic diastolic CHF -she has been fairly stable recently -she does not appear volume overloaded on exam -continue on Lasix 40mg  daily -again stressed the importance of following a strict < 2gm Na diet  2.  Hypertension -BP controlled on exam -continue on Bystolic 5mg  daily.  3.  Persistent atrial fibrillation -denies any palpitations -apple iWatch showed NSR with atrial couplets -Amio stopped due to side effects -no bleeding on DOAC -Continue Bystolic 5mg  daily and Eliquis 5mg  BID -check BMET and CBC  4.  Pulmonary Hypertension -PAP normal on echo 12/2017  Medication Adjustments/Labs and Tests Ordered: Current medicines are reviewed at length with the patient today.  Concerns regarding medicines are outlined above.  Tests Ordered: No orders of the defined types were placed in this encounter.  Medication Changes: No orders of the  defined types were placed in this encounter.   Disposition:  Follow up 1 year  Signed, , MD  11/14/2019 1:16 PM    [facility]

## 2019-11-14 NOTE — Patient Instructions (Signed)
Medication Instructions:  Your physician recommends that you continue on your current medications as directed. Please refer to the Current Medication list given to you today.  *If you need a refill on your cardiac medications before your next appointment, please call your pharmacy*   Lab Work: TODAY: BMET, CBC  If you have labs (blood work) drawn today and your tests are completely normal, you will receive your results only by:  MyChart Message (if you have MyChart) OR  A paper copy in the mail If you have any lab test that is abnormal or we need to change your treatment, we will call you to review the results.   Follow-Up: At [facility], you and your health needs are our priority.  As part of our continuing mission to provide you with exceptional heart care, we have created designated Provider Care Teams.  These Care Teams include your primary Cardiologist (physician) and Advanced Practice Providers (APPs -  Physician Assistants and Nurse Practitioners) who all work together to provide you with the care you need, when you need it.  We recommend signing up for the patient portal called "MyChart".  Sign up information is provided on this After Visit Summary.  MyChart is used to connect with patients for Virtual Visits (Telemedicine).  Patients are able to view lab/test results, encounter notes, upcoming appointments, etc.  Non-urgent messages can be sent to your provider as well.   To learn more about what you can do with MyChart, go to [email or web address].    Your next appointment:   1 year(s)  The format for your next appointment:   In Person  Provider:   You may see Armanda Magic, MD or one of the following Advanced Practice Providers on your designated Care Team:    Ronie Spies, PA-C  Jacolyn Reedy, PA-C

## 2019-11-15 ENCOUNTER — Ambulatory Visit: Payer: Medicare Other | Admitting: Neurology

## 2019-11-15 LAB — CBC
Hematocrit: 40.6 % (ref 34.0–46.6)
Hemoglobin: 14.6 g/dL (ref 11.1–15.9)
MCH: 34 pg — ABNORMAL HIGH (ref 26.6–33.0)
MCHC: 36 g/dL — ABNORMAL HIGH (ref 31.5–35.7)
MCV: 94 fL (ref 79–97)
Platelets: 212 10*3/uL (ref 150–450)
RBC: 4.3 x10E6/uL (ref 3.77–5.28)
RDW: 12.1 % (ref 11.7–15.4)
WBC: 5.5 10*3/uL (ref 3.4–10.8)

## 2019-11-15 LAB — BASIC METABOLIC PANEL
BUN/Creatinine Ratio: 12 (ref 12–28)
BUN: 10 mg/dL (ref 8–27)
CO2: 23 mmol/L (ref 20–29)
Calcium: 8.7 mg/dL (ref 8.7–10.3)
Chloride: 102 mmol/L (ref 96–106)
Creatinine, Ser: 0.85 mg/dL (ref 0.57–1.00)
GFR calc Af Amer: 72 mL/min/{1.73_m2} (ref >59)
GFR calc non Af Amer: 63 mL/min/{1.73_m2} (ref >59)
Glucose: 110 mg/dL — ABNORMAL HIGH (ref 65–99)
Potassium: 3.7 mmol/L (ref 3.5–5.2)
Sodium: 139 mmol/L (ref 134–144)

## 2019-11-18 ENCOUNTER — Other Ambulatory Visit: Payer: Self-pay | Admitting: Cardiology

## 2019-11-27 DIAGNOSIS — Z20822 Contact with and (suspected) exposure to covid-19: Secondary | ICD-10-CM | POA: Diagnosis not present

## 2019-12-06 DIAGNOSIS — G2581 Restless legs syndrome: Secondary | ICD-10-CM | POA: Diagnosis not present

## 2019-12-09 ENCOUNTER — Other Ambulatory Visit: Payer: Self-pay

## 2019-12-09 ENCOUNTER — Ambulatory Visit (INDEPENDENT_AMBULATORY_CARE_PROVIDER_SITE_OTHER): Payer: Medicare Other | Admitting: Internal Medicine

## 2019-12-09 VITALS — BP 148/84 | HR 77 | Ht 69.0 in | Wt 203.0 lb

## 2019-12-09 DIAGNOSIS — I5032 Chronic diastolic (congestive) heart failure: Secondary | ICD-10-CM | POA: Diagnosis not present

## 2019-12-09 DIAGNOSIS — I4819 Other persistent atrial fibrillation: Secondary | ICD-10-CM

## 2019-12-09 NOTE — Patient Instructions (Signed)

## 2019-12-09 NOTE — Progress Notes (Signed)
HPI [name] returns today for follow-up of paroxysmal atrial fibrillation, and hypertension. She is status post ablation. She was placed on amiodarone secondary to recurrent atrial fibrillation and flutter. This was discontinued secondary to severe fatigue and malaise. She has developed worsening musculoskeletal pain and is considering knee replacement surgery. She has not fallen. She denies syncope or chest pain. No shortness of breath except when she goes out of rhythm. For the last several months, she has not had any symptomatic atrial fibrillation or flutter. Allergies  Allergen Reactions  . Codeine Nausea And Vomiting and Nausea Only  . Penicillins Hives    Has patient had a PCN reaction causing immediate rash, facial/tongue/throat swelling, SOB or lightheadedness with hypotension: No Has patient had a PCN reaction causing severe rash involving mucus membranes or skin necrosis: No Has patient had a PCN reaction that required hospitalization No Has patient had a PCN reaction occurring within the last 10 years: No If all of the above answers are "NO", then may proceed with Cephalosporin use.  . Tetracycline Rash  . Tramadol Nausea Only  . Digoxin Other (See Comments)    Flushed neck and face  . Digoxin And Related Other (See Comments)    ' Makes my face blood red"  . Erythromycin Other (See Comments) and Hives    Flushing on face  . Other Other (See Comments)    ECG leads cause skin irritation. ECG leads cause skin irritation.  . Sulfa Antibiotics Other (See Comments)    PRESERVATIVE OF SOME FOODS- MAKES FLUSHES TO FACE AND NECK  . Tetracyclines & Related Other (See Comments)    "Sores on legs"  . Diltiazem Rash and Other (See Comments)    Rash with long acting form  . Diltiazem Hcl Rash    Rash with long acting form  . Sulfites Rash    PRESERVATIVE OF SOME FOODS- MAKES FLUSHES TO FACE AND NECK PRESERVATIVE OF SOME FOODS- MAKES FLUSHES TO FACE AND NECK     Current  Outpatient Medications  Medication Sig Dispense Refill  . ELIQUIS 5 MG TABS tablet TAKE 1 TABLET BY MOUTH 2 TIMES DAILY. 60 tablet 5  . esomeprazole (NEXIUM) 20 MG capsule Take 20 mg by mouth every 3 (three) days.     Marland Kitchen estradiol (CLIMARA - DOSED IN MG/24 HR) 0.025 mg/24hr patch Place 0.025 mg onto the skin once a week.    . furosemide (LASIX) 40 MG tablet Take 40 mg by mouth daily.    Marland Kitchen gabapentin (NEURONTIN) 300 MG capsule Take 300 mg by mouth in the morning and at bedtime. For 10 days    . LORazepam (ATIVAN) 1 MG tablet Take 0.5-1 mg by mouth at bedtime as needed for sleep. For sleep    . Magnesium 500 MG CAPS Take 500 mg by mouth daily.    . Methylcobalamin (B-12) 1000 MCG TBDP Take by mouth.    . potassium chloride (KLOR-CON) 10 MEQ tablet TAKE 1 TABLET BY MOUTH DAILY 90 tablet 3  . pramipexole (MIRAPEX) 0.125 MG tablet Take 3 tablets by mouth daily.    . Riboflavin 400 MG CAPS Take 1 capsule by mouth daily.    Marland Kitchen SYNTHROID 150 MCG tablet Take 150 mcg by mouth daily.    . verapamil (CALAN-SR) 120 MG CR tablet Take 1 tablet (120 mg total) by mouth daily as needed (for rapid heart rate). 60 tablet 3  . Vitamin D, Cholecalciferol, 50 MCG (2000 UT) CAPS Take 1  capsule by mouth daily.     No current facility-administered medications for this visit.     Past Medical History:  Diagnosis Date  . Chronic diastolic heart failure (HCC) 04/22/2015  . Complication of anesthesia   . Diverticular disease   . DJD (degenerative joint disease)   . Dysphagia   . GERD (gastroesophageal reflux disease)   . Hiatal hernia   . Hypertension   . Hypothyroidism   . Lower leg edema   . PAF (paroxysmal atrial fibrillation) (HCC)    s/p ablation x several times now with occasional episodes of PAF.   She has failed sotolol/dofetilide/flecainide/propafenone/Amio and dronedarone either with not controlling her PAF or having side effects  . PONV (postoperative nausea and vomiting)   . Restless leg syndrome   .  Seasonal allergies     ROS:   All systems reviewed and negative except as noted in the HPI.   Past Surgical History:  Procedure Laterality Date  . ABDOMINAL HYSTERECTOMY    . APPENDECTOMY    . ATRIAL FIBRILLATION ABLATION  09-06-13   2'14 -[facility]  . BALLOON DILATION  03/03/2011   Procedure: BALLOON DILATION;  Surgeon: Charolett Bumpers, MD;  Location: [facility] ENDOSCOPY;  Service: Endoscopy;  Laterality: N/A;  . bladder polyps    . BREAST EXCISIONAL BIOPSY Left   . BREAST SURGERY     left lumpectomy  . CARDIOVERSION     x3  . CARDIOVERSION N/A 12/30/2014   Procedure: CARDIOVERSION;  Surgeon: Lewayne Bunting, MD;  Location: [facility] ENDOSCOPY;  Service: Cardiovascular;  Laterality: N/A;  . CARDIOVERSION N/A 04/23/2015   Procedure: CARDIOVERSION;  Surgeon: Thurmon Fair, MD;  Location: [facility] ENDOSCOPY;  Service: Cardiovascular;  Laterality: N/A;  . CATARACT EXTRACTION Left   . COLONOSCOPY WITH PROPOFOL N/A 09/24/2013   Procedure: COLONOSCOPY WITH PROPOFOL;  Surgeon: Charolett Bumpers, MD;  Location: [facility] ENDOSCOPY;  Service: Endoscopy;  Laterality: N/A;  . ESOPHAGOGASTRODUODENOSCOPY  03/03/2011   Procedure: ESOPHAGOGASTRODUODENOSCOPY (EGD);  Surgeon: Charolett Bumpers, MD;  Location: [facility] ENDOSCOPY;  Service: Endoscopy;  Laterality: N/A;  . EYE SURGERY     cataracat  . RHINOPLASTY    . RIGHT HEART CATHETERIZATION N/A 07/11/2014   Procedure: RIGHT HEART CATH;  Surgeon: Laurey Morale, MD;  Location: [facility] CATH LAB;  Service: Cardiovascular;  Laterality: N/A;  . ROTATOR CUFF REPAIR    . THYROIDECTOMY  1973  . TONSILLECTOMY  1942  . TUBAL LIGATION       Family History  Problem Relation Age of Onset  . Transient ischemic attack Mother   . Heart failure Father   . CVA Father   . Lung cancer Father   . Hypertension Sister   . Cancer Brother   . Sudden death Brother   . Anesthesia problems Neg Hx   . Hypotension Neg Hx   . Malignant hyperthermia Neg Hx   . Pseudochol deficiency Neg Hx   .  Heart attack Neg Hx      Social History   Socioeconomic History  . Marital status: Married    Spouse name: Not on file  . Number of children: Not on file  . Years of education: Not on file  . Highest education level: Not on file  Occupational History  . Occupation: retired  Tobacco Use  . Smoking status: Never Smoker  . Smokeless tobacco: Never Used  Vaping Use  . Vaping Use: Never used  Substance and Sexual Activity  . Alcohol use: Yes  Alcohol/week: 0.0 standard drinks    Comment: rare wine  . Drug use: No  . Sexual activity: Never    Birth control/protection: Post-menopausal  Other Topics Concern  . Not on file  Social History Narrative   Right handed   Two story home   Drinks caffeine   Social Determinants of Health   Financial Resource Strain:   . Difficulty of Paying Living Expenses: Not on file  Food Insecurity:   . Worried About Programme researcher, broadcasting/film/video in the Last Year: Not on file  . Ran Out of Food in the Last Year: Not on file  Transportation Needs:   . Lack of Transportation (Medical): Not on file  . Lack of Transportation (Non-Medical): Not on file  Physical Activity:   . Days of Exercise per Week: Not on file  . Minutes of Exercise per Session: Not on file  Stress:   . Feeling of Stress : Not on file  Social Connections:   . Frequency of Communication with Friends and Family: Not on file  . Frequency of Social Gatherings with Friends and Family: Not on file  . Attends Religious Services: Not on file  . Active Member of Clubs or Organizations: Not on file  . Attends Banker Meetings: Not on file  . Marital Status: Not on file  Intimate Partner Violence:   . Fear of Current or Ex-Partner: Not on file  . Emotionally Abused: Not on file  . Physically Abused: Not on file  . Sexually Abused: Not on file     BP (!) 148/84   Pulse 77   Ht 5\' 9"  (1.753 m)   Wt 203 lb (92.1 kg)   BMI 29.98 kg/m   Physical Exam:  Well appearing  86 year old woman who looks younger than her stated age, NAD HEENT: Unremarkable Neck: 6 cm JVD, no thyromegally Lymphatics:  No adenopathy Back:  No CVA tenderness Lungs:  Clear, with no wheezes, rales, or rhonchi HEART:  Regular rate rhythm, no murmurs, no rubs, no clicks Abd:  soft, positive bowel sounds, no organomegally, no rebound, no guarding Ext:  2 plus pulses, no edema, no cyanosis, no clubbing Skin:  No rashes no nodules Neuro:  CN II through XII intact, motor grossly intact  EKG -normal sinus rhythm  Assess/Plan: 1. Paroxysmal atrial fibrillation and flutter -she is maintaining sinus rhythm very nicely. She will continue her current medical therapy. 2. Preoperative cardiac evaluation -the patient is doing well and has had no evidence of angina or heart failure symptoms. She is low risk for cardiac complications from pending knee replacement surgery. The patient may stop her blood thinner for 3 days prior to knee replacement surgery. She can start her blood thinner back when bleeding risk is acceptable if she undergoes knee replacement surgery. 3. Hypertension -her blood pressures have been reasonably well controlled. She will maintain a low-sodium diet. 4. Diastolic heart failure -her symptoms are class I in sinus rhythm. She will continue her current medical therapy.  83 Litzy Dicker,MD

## 2019-12-11 ENCOUNTER — Other Ambulatory Visit: Payer: Self-pay | Admitting: Student

## 2019-12-12 DIAGNOSIS — G2581 Restless legs syndrome: Secondary | ICD-10-CM | POA: Diagnosis not present

## 2019-12-17 ENCOUNTER — Telehealth: Payer: Self-pay | Admitting: Neurology

## 2019-12-17 NOTE — Telephone Encounter (Signed)
Patient returned call from [place]. Please see closed telephone note from earlier today for reference.

## 2019-12-17 NOTE — Telephone Encounter (Signed)
Received message from PCP that patient's RLS worse.  He just increased gabapentin 300mg  to three times daily.  In addition, I recommend increasing pramipexole:  0.125mg  in AM and 0.25mg  2-3 hours prior to bedtime for one week, then   0.25mg  in AM and 0.25mg  2-3 hours prior to bedtime.

## 2019-12-17 NOTE — Telephone Encounter (Signed)
Based on her PCP's note from last week, both the pramipexole and gabapentin were increased.  I would continue current management for now.  If no improvement in 4 weeks, she should contact me and we can increase dosing.

## 2019-12-17 NOTE — Telephone Encounter (Signed)
Pt advised of Dr.Jaffe note. 

## 2019-12-17 NOTE — Telephone Encounter (Signed)
Pt states she takes 0.125mg  two tabs at night

## 2019-12-26 NOTE — Progress Notes (Signed)
NEUROLOGY FOLLOW UP OFFICE NOTE  Natasha [name] [id number]  HISTORY OF PRESENT ILLNESS: Natasha [name] is an 86 year old white female with HTN, PAF, chronic diastolic heart failure, DJD, hypothyroidism, and restless leg syndrome who follows up for restless leg syndrome and neuropathy.Marland Kitchen  UPDATE: Current medications:  Pramipexole 0.125mg  late afternoon and 0.25mg  2-3 hours prior to bedtime 6-7.  Gabapentin 300mg  TID  Last seen in June.  At that time, she was instructed to take pramipexole 0.125mg  in late afternoon and 0.125 to 0.25mg  2 to 3 hours prior to bedtime.  She reports that her restless leg symptoms have increased.  Her PCP started her on gabapentin which was increased to 300mg  three times daily about 2 weeks ago.  She reported that she was taking 0.25mg  of pramipexole at night and I instructed her last week to increase to 0.25mg  twice daily over the next two weeks.  At this time, she is still taking 0.25mg  at 6-7 PM.  She says her symptoms are fairly well-controlled at night but sleep may be interrupted due to pain.  She reports  restless leg symptoms whenever she sits down during the day.  Her primary problem is significant pain in the neck, shoulders, knees, hips and feet.     HISTORY: She has had a very gradual onset of lower extremity weakness beginning around 2016 or 2017. She feels wobbly on her feet. Over the past year, she no longer is able to walk up steps with her two feet. She needs to pull herself, either grabbing the bannister or bending over and climbing up the steps on all fours. Her legs ache. She was evaluated by pain management for low back and left hip pain. She has an MRI of the lumbar spine without contrast on 05/02/2018, which was personally reviewed, and showed multilevel lumbar spondylosis and degenerative endplate marrow edema at L1-L2 with left lateral disc bulge and spurring and mild left L1 foraminal stenosis as well as edema in the medial left psoas muscle  at the L1-L2 level.She was told surgery wasn't an option.    She presented to the ED on 04/07/2019 following a fallin which shestruck the back of her head, sustaining a contusion. CT of head and cervical spine personally reviewed showed no evidence of trauma or other acute abnormality. No associated dizziness or passing out. She also reports numbness and tingling involving her fingers and toes. She feels shooting pain in the right ring finger and left middle finger. She reports spurs in her neck but denies any significant neck pain. No weakness in the upper extremities. She has restless leg syndrome which has gotten worseover the past several months. It feels like that her legs are jumping. She needs to keep moving her legs for relief. She is starting to notice it earlier in the evening while sitting and watching TV.She was started on pramipexole 0.125mg 2 to 3 hours beforebedtime which initially was helpful but then needed to increase to 0.25mg .  Labs from February 2021 showed B12 346, folate 12.7, TSH 1.09, CK 54, aldolase 5.1, ferritin 65.  SPEP/IFE from 08/01/2019 showed polyclonal increase in IgA but no M-spike.  NCV-EMG of lower extremities on 07/24/2019 demonstrated a chronic sensorimotor axonal polyneuropathy.   PAST MEDICAL HISTORY: Past Medical History:  Diagnosis Date  . Chronic diastolic heart failure (HCC) 04/22/2015  . Complication of anesthesia   . Diverticular disease   . DJD (degenerative joint disease)   . Dysphagia   . GERD (gastroesophageal reflux  disease)   . Hiatal hernia   . Hypertension   . Hypothyroidism   . Lower leg edema   . PAF (paroxysmal atrial fibrillation) (HCC)    s/p ablation x several times now with occasional episodes of PAF.   She has failed sotolol/dofetilide/flecainide/propafenone/Amio and dronedarone either with not controlling her PAF or having side effects  . PONV (postoperative nausea and vomiting)   . Restless leg syndrome   .  Seasonal allergies     MEDICATIONS: Current Outpatient Medications on File Prior to Visit  Medication Sig Dispense Refill  . ELIQUIS 5 MG TABS tablet TAKE 1 TABLET BY MOUTH 2 TIMES DAILY. 60 tablet 5  . esomeprazole (NEXIUM) 20 MG capsule Take 20 mg by mouth every 3 (three) days.     Marland Kitchen estradiol (CLIMARA - DOSED IN MG/24 HR) 0.025 mg/24hr patch Place 0.025 mg onto the skin once a week.    . furosemide (LASIX) 40 MG tablet TAKE 1 AND 1/2 TABLETS BY MOUTH DAILY. 90 tablet 3  . gabapentin (NEURONTIN) 300 MG capsule Take 300 mg by mouth in the morning and at bedtime. For 10 days    . LORazepam (ATIVAN) 1 MG tablet Take 0.5-1 mg by mouth at bedtime as needed for sleep. For sleep    . Magnesium 500 MG CAPS Take 500 mg by mouth daily.    . Methylcobalamin (B-12) 1000 MCG TBDP Take by mouth.    . potassium chloride (KLOR-CON) 10 MEQ tablet TAKE 1 TABLET BY MOUTH DAILY 90 tablet 3  . pramipexole (MIRAPEX) 0.125 MG tablet Take 3 tablets by mouth daily.    . Riboflavin 400 MG CAPS Take 1 capsule by mouth daily.    Marland Kitchen SYNTHROID 150 MCG tablet Take 150 mcg by mouth daily.    . verapamil (CALAN-SR) 120 MG CR tablet Take 1 tablet (120 mg total) by mouth daily as needed (for rapid heart rate). 60 tablet 3  . Vitamin D, Cholecalciferol, 50 MCG (2000 UT) CAPS Take 1 capsule by mouth daily.     No current facility-administered medications on file prior to visit.    ALLERGIES: Allergies  Allergen Reactions  . Codeine Nausea And Vomiting and Nausea Only  . Penicillins Hives    Has patient had a PCN reaction causing immediate rash, facial/tongue/throat swelling, SOB or lightheadedness with hypotension: No Has patient had a PCN reaction causing severe rash involving mucus membranes or skin necrosis: No Has patient had a PCN reaction that required hospitalization No Has patient had a PCN reaction occurring within the last 10 years: No If all of the above answers are "NO", then may proceed with Cephalosporin  use.  . Tetracycline Rash  . Tramadol Nausea Only  . Digoxin Other (See Comments)    Flushed neck and face  . Digoxin And Related Other (See Comments)    ' Makes my face blood red"  . Erythromycin Other (See Comments) and Hives    Flushing on face  . Other Other (See Comments)    ECG leads cause skin irritation. ECG leads cause skin irritation.  . Sulfa Antibiotics Other (See Comments)    PRESERVATIVE OF SOME FOODS- MAKES FLUSHES TO FACE AND NECK  . Tetracyclines & Related Other (See Comments)    "Sores on legs"  . Diltiazem Rash and Other (See Comments)    Rash with long acting form  . Diltiazem Hcl Rash    Rash with long acting form  . Sulfites Rash    PRESERVATIVE OF  SOME FOODS- MAKES FLUSHES TO FACE AND NECK PRESERVATIVE OF SOME FOODS- MAKES FLUSHES TO FACE AND NECK    FAMILY HISTORY: Family History  Problem Relation Age of Onset  . Transient ischemic attack Mother   . Heart failure Father   . CVA Father   . Lung cancer Father   . Hypertension Sister   . Cancer Brother   . Sudden death Brother   . Anesthesia problems Neg Hx   . Hypotension Neg Hx   . Malignant hyperthermia Neg Hx   . Pseudochol deficiency Neg Hx   . Heart attack Neg Hx     SOCIAL HISTORY: Social History   Socioeconomic History  . Marital status: Married    Spouse name: Not on file  . Number of children: Not on file  . Years of education: Not on file  . Highest education level: Not on file  Occupational History  . Occupation: retired  Tobacco Use  . Smoking status: Never Smoker  . Smokeless tobacco: Never Used  Vaping Use  . Vaping Use: Never used  Substance and Sexual Activity  . Alcohol use: Yes    Alcohol/week: 0.0 standard drinks    Comment: rare wine  . Drug use: No  . Sexual activity: Never    Birth control/protection: Post-menopausal  Other Topics Concern  . Not on file  Social History Narrative   Right handed   Two story home   Drinks caffeine   Social Determinants of  Health   Financial Resource Strain:   . Difficulty of Paying Living Expenses: Not on file  Food Insecurity:   . Worried About Programme researcher, broadcasting/film/video in the Last Year: Not on file  . Ran Out of Food in the Last Year: Not on file  Transportation Needs:   . Lack of Transportation (Medical): Not on file  . Lack of Transportation (Non-Medical): Not on file  Physical Activity:   . Days of Exercise per Week: Not on file  . Minutes of Exercise per Session: Not on file  Stress:   . Feeling of Stress : Not on file  Social Connections:   . Frequency of Communication with Friends and Family: Not on file  . Frequency of Social Gatherings with Friends and Family: Not on file  . Attends Religious Services: Not on file  . Active Member of Clubs or Organizations: Not on file  . Attends Banker Meetings: Not on file  . Marital Status: Not on file  Intimate Partner Violence:   . Fear of Current or Ex-Partner: Not on file  . Emotionally Abused: Not on file  . Physically Abused: Not on file  . Sexually Abused: Not on file   PHYSICAL EXAM: Blood pressure (!) 158/84, pulse 95, weight 203 lb 9.6 oz (92.4 kg), SpO2 97 %. General: No acute distress.  Patient appears well-groomed.   Head:  Normocephalic/atraumatic  IMPRESSION: 1.  Restless leg syndrome 2.  Idiopathic polyneuropathy 3.  Lumbar spondylosis 4.  Osteoarthritis in knees  While she has restless leg symptoms, I think her primary problem is chronic pain related to underlying polyneuropathy, lumbar spondylosis and osteoarthritis in the knees.  The pain is likely aggravating her restless legs as well.  Therefore, I would continue titrating the gabapentin, which may treat both pain and restless leg.    PLAN: 1.  Titrate gabapentin to 400mg  three times daily.  We can continue to titrate to 600mg  three times daily if needed.  If ineffective or if  she has side effects, we can try switching to Lyrica. 2.  She will remain on pramipexole  0.25mg  every evening for now, as it seems to help with symptoms at bedtime. 3.  Follow up 3 months.  Total time spent with patient and reviewing chart:  33 minutes    Shon Millet, DO  CC: Marden Noble, MD

## 2019-12-27 ENCOUNTER — Other Ambulatory Visit: Payer: Self-pay

## 2019-12-27 ENCOUNTER — Encounter: Payer: Self-pay | Admitting: Neurology

## 2019-12-27 ENCOUNTER — Ambulatory Visit (INDEPENDENT_AMBULATORY_CARE_PROVIDER_SITE_OTHER): Payer: Medicare Other | Admitting: Neurology

## 2019-12-27 VITALS — BP 158/84 | HR 95 | Wt 203.6 lb

## 2019-12-27 DIAGNOSIS — G2581 Restless legs syndrome: Secondary | ICD-10-CM | POA: Diagnosis not present

## 2019-12-27 DIAGNOSIS — M47816 Spondylosis without myelopathy or radiculopathy, lumbar region: Secondary | ICD-10-CM

## 2019-12-27 DIAGNOSIS — G629 Polyneuropathy, unspecified: Secondary | ICD-10-CM | POA: Diagnosis not present

## 2019-12-27 MED ORDER — GABAPENTIN 100 MG PO CAPS: ORAL_CAPSULE | ORAL | 5 refills | Status: DC

## 2019-12-27 NOTE — Patient Instructions (Signed)
1.  I am going to prescribe you gabapentin 100mg  capsules to take with the 300mg  capsule.  Increase dose as per instructions:  Take 300mg  pill in morning, 300mg  in afternoon, and 300mg  plus 100mg  at night for one week  Then 300mg  plus 100mg  in morning, 300mg  in afternoon, and 300mg  plus 100mg  at night for one week  Then 300mg  plus 100mg  three times daily 2.  Continue pramipexole 2 pills at 6-7 PM 3.  Follow up 3 months.  Contact me if we need to make any changes.

## 2020-01-01 DIAGNOSIS — I4891 Unspecified atrial fibrillation: Secondary | ICD-10-CM | POA: Diagnosis not present

## 2020-01-01 DIAGNOSIS — J449 Chronic obstructive pulmonary disease, unspecified: Secondary | ICD-10-CM | POA: Diagnosis not present

## 2020-01-01 DIAGNOSIS — E038 Other specified hypothyroidism: Secondary | ICD-10-CM | POA: Diagnosis not present

## 2020-01-01 DIAGNOSIS — I10 Essential (primary) hypertension: Secondary | ICD-10-CM | POA: Diagnosis not present

## 2020-01-01 DIAGNOSIS — I5032 Chronic diastolic (congestive) heart failure: Secondary | ICD-10-CM | POA: Diagnosis not present

## 2020-01-01 DIAGNOSIS — G43009 Migraine without aura, not intractable, without status migrainosus: Secondary | ICD-10-CM | POA: Diagnosis not present

## 2020-01-01 DIAGNOSIS — E890 Postprocedural hypothyroidism: Secondary | ICD-10-CM | POA: Diagnosis not present

## 2020-01-01 DIAGNOSIS — I119 Hypertensive heart disease without heart failure: Secondary | ICD-10-CM | POA: Diagnosis not present

## 2020-01-01 DIAGNOSIS — M170 Bilateral primary osteoarthritis of knee: Secondary | ICD-10-CM | POA: Diagnosis not present

## 2020-01-01 DIAGNOSIS — E039 Hypothyroidism, unspecified: Secondary | ICD-10-CM | POA: Diagnosis not present

## 2020-01-21 DIAGNOSIS — Z23 Encounter for immunization: Secondary | ICD-10-CM | POA: Diagnosis not present

## 2020-01-30 ENCOUNTER — Telehealth: Payer: Self-pay | Admitting: Neurology

## 2020-01-30 NOTE — Telephone Encounter (Signed)
I don't suspect the gabapentin is causing leg soreness.  But if she is concerned, would she be agreeable to stopping gabapentin and instead starting Lyrica?

## 2020-01-30 NOTE — Telephone Encounter (Signed)
Patient called in stating she is having some trouble with her gabapentin. She stated it is not stopping her restless leg. She is also sore all over and is wondering if that could be a side effect?

## 2020-01-31 ENCOUNTER — Other Ambulatory Visit: Payer: Self-pay | Admitting: Neurology

## 2020-01-31 MED ORDER — PREGABALIN 50 MG PO CAPS: 50.0000 mg | ORAL_CAPSULE | Freq: Three times a day (TID) | ORAL | 5 refills | Status: DC

## 2020-01-31 NOTE — Telephone Encounter (Signed)
Pt okay with starting Lyrica.

## 2020-01-31 NOTE — Telephone Encounter (Signed)
Sent in prescription for Lyrica 50mg  TID to Drugs.  Patient should stop gabapentin.

## 2020-02-03 ENCOUNTER — Telehealth: Payer: Self-pay

## 2020-02-03 DIAGNOSIS — I5032 Chronic diastolic (congestive) heart failure: Secondary | ICD-10-CM | POA: Diagnosis not present

## 2020-02-03 DIAGNOSIS — G43009 Migraine without aura, not intractable, without status migrainosus: Secondary | ICD-10-CM | POA: Diagnosis not present

## 2020-02-03 DIAGNOSIS — I4891 Unspecified atrial fibrillation: Secondary | ICD-10-CM | POA: Diagnosis not present

## 2020-02-03 DIAGNOSIS — I10 Essential (primary) hypertension: Secondary | ICD-10-CM | POA: Diagnosis not present

## 2020-02-03 DIAGNOSIS — D6869 Other thrombophilia: Secondary | ICD-10-CM | POA: Diagnosis not present

## 2020-02-03 DIAGNOSIS — K649 Unspecified hemorrhoids: Secondary | ICD-10-CM | POA: Diagnosis not present

## 2020-02-03 DIAGNOSIS — E039 Hypothyroidism, unspecified: Secondary | ICD-10-CM | POA: Diagnosis not present

## 2020-02-03 DIAGNOSIS — K219 Gastro-esophageal reflux disease without esophagitis: Secondary | ICD-10-CM | POA: Diagnosis not present

## 2020-02-03 DIAGNOSIS — N951 Menopausal and female climacteric states: Secondary | ICD-10-CM | POA: Diagnosis not present

## 2020-02-03 DIAGNOSIS — G2581 Restless legs syndrome: Secondary | ICD-10-CM | POA: Diagnosis not present

## 2020-02-03 DIAGNOSIS — M2550 Pain in unspecified joint: Secondary | ICD-10-CM | POA: Diagnosis not present

## 2020-02-03 NOTE — Telephone Encounter (Signed)
That's fine.  We never discontinued pramipexole.

## 2020-02-03 NOTE — Telephone Encounter (Signed)
Pt wanted to know if she should continue taking Pramipexole 0.125 mg

## 2020-02-04 ENCOUNTER — Other Ambulatory Visit: Payer: Self-pay | Admitting: Neurology

## 2020-02-04 ENCOUNTER — Encounter: Payer: Self-pay | Admitting: Neurology

## 2020-02-04 MED ORDER — PREGABALIN 50 MG PO CAPS: 50.0000 mg | ORAL_CAPSULE | Freq: Three times a day (TID) | ORAL | 5 refills | Status: DC

## 2020-02-04 NOTE — Telephone Encounter (Signed)
Pt advised. Pt states the Lyrica wasn't at the pharmacy. Can we resend it please.

## 2020-02-04 NOTE — Telephone Encounter (Signed)
Script resent

## 2020-02-04 NOTE — Progress Notes (Addendum)
Received fax from Pharm that PA is needed for medication.  Natasha [name] Key: [id number] - PA Case ID: [id number] Need help? Call us at [phone] Outcome Approvedtoday Your request has been approved Drug Pregabalin 50MG  capsules Form [employer] Electronic PA Form [phone] NCPDP)  Approved through 11/06/19 to 02/03/21.

## 2020-02-06 DIAGNOSIS — Z23 Encounter for immunization: Secondary | ICD-10-CM | POA: Diagnosis not present

## 2020-02-14 DIAGNOSIS — E038 Other specified hypothyroidism: Secondary | ICD-10-CM | POA: Diagnosis not present

## 2020-02-14 DIAGNOSIS — G43009 Migraine without aura, not intractable, without status migrainosus: Secondary | ICD-10-CM | POA: Diagnosis not present

## 2020-02-14 DIAGNOSIS — J449 Chronic obstructive pulmonary disease, unspecified: Secondary | ICD-10-CM | POA: Diagnosis not present

## 2020-02-14 DIAGNOSIS — E039 Hypothyroidism, unspecified: Secondary | ICD-10-CM | POA: Diagnosis not present

## 2020-02-14 DIAGNOSIS — I4891 Unspecified atrial fibrillation: Secondary | ICD-10-CM | POA: Diagnosis not present

## 2020-02-14 DIAGNOSIS — M170 Bilateral primary osteoarthritis of knee: Secondary | ICD-10-CM | POA: Diagnosis not present

## 2020-02-14 DIAGNOSIS — I119 Hypertensive heart disease without heart failure: Secondary | ICD-10-CM | POA: Diagnosis not present

## 2020-02-14 DIAGNOSIS — E890 Postprocedural hypothyroidism: Secondary | ICD-10-CM | POA: Diagnosis not present

## 2020-02-14 DIAGNOSIS — I5032 Chronic diastolic (congestive) heart failure: Secondary | ICD-10-CM | POA: Diagnosis not present

## 2020-02-14 DIAGNOSIS — I10 Essential (primary) hypertension: Secondary | ICD-10-CM | POA: Diagnosis not present

## 2020-02-28 DIAGNOSIS — M791 Myalgia, unspecified site: Secondary | ICD-10-CM | POA: Diagnosis not present

## 2020-03-09 ENCOUNTER — Ambulatory Visit: Payer: Medicare Other | Admitting: Neurology

## 2020-03-09 ENCOUNTER — Telehealth: Payer: Self-pay | Admitting: Neurology

## 2020-03-09 NOTE — Telephone Encounter (Signed)
Patient called with questions for the nurse. She said, "I have a rash on my chest and upper neck and also am having hair loss and swollen feet and ankles. The mediation really is not helping with the RLS or with the neuropathy. I cannot really walk and had to get an ambulator to get around the house. I really am not doing well."  [facility]

## 2020-03-09 NOTE — Telephone Encounter (Signed)
Pt advised.

## 2020-03-09 NOTE — Telephone Encounter (Signed)
Pt states the symptoms started after starting her Pregablin. Rash is intense on her chest, Rash started over the last week. The itching more intense around the neck area. Pt wanted to know if the symptoms could be from the medication.  Pt also reports her  mobility is getting worse. She has to use a walker to get aorund everywhere.     Please advise.

## 2020-03-09 NOTE — Telephone Encounter (Signed)
Stop the Lyrica.  When the rash clears, we can try something new.

## 2020-03-17 DIAGNOSIS — M170 Bilateral primary osteoarthritis of knee: Secondary | ICD-10-CM | POA: Diagnosis not present

## 2020-03-17 DIAGNOSIS — E038 Other specified hypothyroidism: Secondary | ICD-10-CM | POA: Diagnosis not present

## 2020-03-17 DIAGNOSIS — I119 Hypertensive heart disease without heart failure: Secondary | ICD-10-CM | POA: Diagnosis not present

## 2020-03-17 DIAGNOSIS — I5032 Chronic diastolic (congestive) heart failure: Secondary | ICD-10-CM | POA: Diagnosis not present

## 2020-03-17 DIAGNOSIS — I4891 Unspecified atrial fibrillation: Secondary | ICD-10-CM | POA: Diagnosis not present

## 2020-03-17 DIAGNOSIS — E039 Hypothyroidism, unspecified: Secondary | ICD-10-CM | POA: Diagnosis not present

## 2020-03-17 DIAGNOSIS — E890 Postprocedural hypothyroidism: Secondary | ICD-10-CM | POA: Diagnosis not present

## 2020-03-17 DIAGNOSIS — K219 Gastro-esophageal reflux disease without esophagitis: Secondary | ICD-10-CM | POA: Diagnosis not present

## 2020-03-17 DIAGNOSIS — I10 Essential (primary) hypertension: Secondary | ICD-10-CM | POA: Diagnosis not present

## 2020-03-17 DIAGNOSIS — J449 Chronic obstructive pulmonary disease, unspecified: Secondary | ICD-10-CM | POA: Diagnosis not present

## 2020-03-17 DIAGNOSIS — G43009 Migraine without aura, not intractable, without status migrainosus: Secondary | ICD-10-CM | POA: Diagnosis not present

## 2020-03-24 NOTE — Progress Notes (Signed)
NEUROLOGY FOLLOW UP OFFICE NOTE  MATTIA OSTERMAN [id number]   Subjective:  Natasha [name] is an 86 year old white female with HTN, PAF, chronic diastolic heart failure, DJD, hypothyroidism, and restless leg syndrome who follows up for neuropathy and restless leg syndrome.  She is accompanied by her husband who supplements history.  UPDATE: Current medications: Pramipexole 0.25mg  2-3 hours prior to bedtime 6-7 (second dose earlier in evening/late afternoon ineffective)., lorazepam 0.5-1mg  QHS PRN (to help with sleep)   Gabapentin was discontinued because it was ineffective.  She was started on Lyrica which was stopped a couple of weeks ago due to possibly causing a rash.  Her legs always ache and feel heavy.  She has burning in her feet.  She has low back pain, hip pain and knee pain.  She says she can't walk.  Her right leg may jerk involuntarily at times.   HISTORY: She has had a very gradual onset of lower extremity weakness beginning around 2016 or 2017. She feels wobbly on her feet. Over the past year, she no longer is able to walk up steps with her two feet. She needs to pull herself, either grabbing the bannister or bending over and climbing up the steps on all fours. Her legs ache. She was evaluated by pain management for low back and left hip pain. She has an MRI of the lumbar spine without contrast on 05/02/2018, which was personally reviewed, and showed multilevel lumbar spondylosis and degenerative endplate marrow edema at L1-L2 with left lateral disc bulge and spurring and mild left L1 foraminal stenosis as well as edema in the medial left psoas muscle at the L1-L2 level.She was told surgery wasn't an option.   She presented to the ED on 04/07/2019 following a fallin which shestruck the back of her head, sustaining a contusion. CT of head and cervical spine personally reviewed showed no evidence of trauma or other acute abnormality. No associated dizziness or  passing out. She also reports numbness and tingling involving her fingers and toes. She feels shooting pain in the right ring finger and left middle finger. She reports spurs in her neck but denies any significant neck pain. No weakness in the upper extremities. She has restless leg syndrome which has gotten worseover the past several months. It feels like that her legs are jumping. She needs to keep moving her legs for relief. She is starting to notice it earlier in the evening while sitting and watching TV.She was started on pramipexole 0.125mg 2 to 3 hours beforebedtime which initially was helpful but then needed to increase to 0.25mg .  Labs from February 2021 showed B12 346, folate 12.7, TSH 1.09, CK 54, aldolase 5.1, ferritin 65. SPEP/IFE from 08/01/2019 showed polyclonal increase in IgA but no M-spike.  NCV-EMG of lower extremities on 07/24/2019 demonstrated a chronic sensorimotor axonal polyneuropathy.  Past medications:  Gabapentin (ineffective), Lyrica (rash)  PAST MEDICAL HISTORY: Past Medical History:  Diagnosis Date  . Chronic diastolic heart failure (HCC) 04/22/2015  . Complication of anesthesia   . Diverticular disease   . DJD (degenerative joint disease)   . Dysphagia   . GERD (gastroesophageal reflux disease)   . Hiatal hernia   . Hypertension   . Hypothyroidism   . Lower leg edema   . PAF (paroxysmal atrial fibrillation) (HCC)    s/p ablation x several times now with occasional episodes of PAF.   She has failed sotolol/dofetilide/flecainide/propafenone/Amio and dronedarone either with not controlling her PAF or having side  effects  . PONV (postoperative nausea and vomiting)   . Restless leg syndrome   . Seasonal allergies     MEDICATIONS: Current Outpatient Medications on File Prior to Visit  Medication Sig Dispense Refill  . ELIQUIS 5 MG TABS tablet TAKE 1 TABLET BY MOUTH 2 TIMES DAILY. 60 tablet 5  . esomeprazole (NEXIUM) 20 MG capsule Take 20 mg by mouth every  3 (three) days.     Marland Kitchen estradiol (CLIMARA - DOSED IN MG/24 HR) 0.025 mg/24hr patch Place 0.025 mg onto the skin once a week.    . furosemide (LASIX) 40 MG tablet TAKE 1 AND 1/2 TABLETS BY MOUTH DAILY. 90 tablet 3  . LORazepam (ATIVAN) 1 MG tablet Take 0.5-1 mg by mouth at bedtime as needed for sleep. For sleep    . Magnesium 500 MG CAPS Take 500 mg by mouth daily.    . Methylcobalamin (B-12) 1000 MCG TBDP Take by mouth.    . potassium chloride (KLOR-CON) 10 MEQ tablet TAKE 1 TABLET BY MOUTH DAILY 90 tablet 3  . pramipexole (MIRAPEX) 0.125 MG tablet Take 3 tablets by mouth 2 (two) times daily.     . pregabalin (LYRICA) 50 MG capsule Take 1 capsule (50 mg total) by mouth 3 (three) times daily. 90 capsule 5  . Riboflavin 400 MG CAPS Take 1 capsule by mouth daily.    Marland Kitchen SYNTHROID 150 MCG tablet Take 150 mcg by mouth daily.    . verapamil (CALAN-SR) 120 MG CR tablet Take 1 tablet (120 mg total) by mouth daily as needed (for rapid heart rate). 60 tablet 3  . Vitamin D, Cholecalciferol, 50 MCG (2000 UT) CAPS Take 1 capsule by mouth daily.     No current facility-administered medications on file prior to visit.    ALLERGIES: Allergies  Allergen Reactions  . Codeine Nausea And Vomiting and Nausea Only  . Penicillins Hives    Has patient had a PCN reaction causing immediate rash, facial/tongue/throat swelling, SOB or lightheadedness with hypotension: No Has patient had a PCN reaction causing severe rash involving mucus membranes or skin necrosis: No Has patient had a PCN reaction that required hospitalization No Has patient had a PCN reaction occurring within the last 10 years: No If all of the above answers are "NO", then may proceed with Cephalosporin use.  . Tetracycline Rash  . Tramadol Nausea Only  . Digoxin Other (See Comments)    Flushed neck and face  . Digoxin And Related Other (See Comments)    ' Makes my face blood red"  . Erythromycin Other (See Comments) and Hives    Flushing on  face  . Other Other (See Comments)    ECG leads cause skin irritation. ECG leads cause skin irritation.  . Sulfa Antibiotics Other (See Comments)    PRESERVATIVE OF SOME FOODS- MAKES FLUSHES TO FACE AND NECK  . Tetracyclines & Related Other (See Comments)    "Sores on legs"  . Diltiazem Rash and Other (See Comments)    Rash with long acting form  . Diltiazem Hcl Rash    Rash with long acting form  . Sulfites Rash    PRESERVATIVE OF SOME FOODS- MAKES FLUSHES TO FACE AND NECK PRESERVATIVE OF SOME FOODS- MAKES FLUSHES TO FACE AND NECK    FAMILY HISTORY: Family History  Problem Relation Age of Onset  . Transient ischemic attack Mother   . Heart failure Father   . CVA Father   . Lung cancer Father   .  Hypertension Sister   . Cancer Brother   . Sudden death Brother   . Anesthesia problems Neg Hx   . Hypotension Neg Hx   . Malignant hyperthermia Neg Hx   . Pseudochol deficiency Neg Hx   . Heart attack Neg Hx     SOCIAL HISTORY: Social History   Socioeconomic History  . Marital status: Married    Spouse name: Not on file  . Number of children: Not on file  . Years of education: Not on file  . Highest education level: Not on file  Occupational History  . Occupation: retired  Tobacco Use  . Smoking status: Never Smoker  . Smokeless tobacco: Never Used  Vaping Use  . Vaping Use: Never used  Substance and Sexual Activity  . Alcohol use: Yes    Alcohol/week: 0.0 standard drinks    Comment: rare wine  . Drug use: No  . Sexual activity: Never    Birth control/protection: Post-menopausal  Other Topics Concern  . Not on file  Social History Narrative   Right handed   Two story home   Drinks caffeine   Social Determinants of Health   Financial Resource Strain:   . Difficulty of Paying Living Expenses: Not on file  Food Insecurity:   . Worried About Programme researcher, broadcasting/film/video in the Last Year: Not on file  . Ran Out of Food in the Last Year: Not on file  Transportation  Needs:   . Lack of Transportation (Medical): Not on file  . Lack of Transportation (Non-Medical): Not on file  Physical Activity:   . Days of Exercise per Week: Not on file  . Minutes of Exercise per Session: Not on file  Stress:   . Feeling of Stress : Not on file  Social Connections:   . Frequency of Communication with Friends and Family: Not on file  . Frequency of Social Gatherings with Friends and Family: Not on file  . Attends Religious Services: Not on file  . Active Member of Clubs or Organizations: Not on file  . Attends Banker Meetings: Not on file  . Marital Status: Not on file  Intimate Partner Violence:   . Fear of Current or Ex-Partner: Not on file  . Emotionally Abused: Not on file  . Physically Abused: Not on file  . Sexually Abused: Not on file     Objective:  Blood pressure (!) 183/104, pulse (!) 104, height 5\' 8"  (1.727 m), weight 178 lb (80.7 kg), SpO2 99 %. General: No acute distress.  Patient appears well-groomed.   Head:  Normocephalic/atraumatic Eyes:  Fundi examined but not visualized Neck: supple, no paraspinal tenderness, full range of motion Heart:  Regular rate and rhythm Lungs:  Clear to auscultation bilaterally Back: No paraspinal tenderness Neurological Exam: alert and oriented to person, place, and time. Attention span and concentration intact, recent and remote memory intact, fund of knowledge intact.  Speech fluent and not dysarthric, language intact.  CN II-XII intact. Bulk and tone normal, muscle strength 5/5 throughout.  Sensation to pinprick mildly reduced in feet; vibratory sensation reduced in feet.  Deep tendon reflexes 2+ throughout, toes downgoing.  Finger to nose and heel to shin testing intact.  Cautious wide based gait.     Assessment/Plan:   1.  Peripheral neuropathy 2.  Restless leg syndrome 3.  Lumbar spondylosis 4.  Osteoarthritis in the knees. I believe that her problem is mainly pain rather than restless leg  syndrome, pain related to  neuropathy, lumbar radiculitis, degenerative disease of the lumbar spine and arthritis of her knees.  She reports sometimes involuntary jerking of the leg which may be related to pain.  She is currently no longer on medication that may cause myoclonus (off of gabapentin).  She has lumbar spondylosis so may consider cervical stenosis causing myelopathy.  She does not exhibit hyperreflexia, but that may be absent in setting of polyneuropathy and advanced age.  She does not exhibit any objective symptoms of myelopathy, such as significant extremity weakness or sensory deficits not suspicious for radiculopathy or polyneuropathy.  Due to her age and likelihood that she would not pursue any potential surgery, MRI of cervical spine deferred.  Will continue to monitor for changes. 5.  Hypertension  1.  To treat neuropathic pain, we will start nortriptyline 10mg  at bedtime.  We can increase dose in 6 weeks if needed.  Side effects discussed.  If she fails nortriptyline, will try duloxetine, but I wished to avoid for now as SSRIs/SNRIs may aggravate restless leg. 2.  Follow up with Dr. regarding blood pressure. 3.  Follow up 4 to 6 months.  Kevan Ny, DO  CC: Shon Millet, MD

## 2020-03-25 ENCOUNTER — Ambulatory Visit (INDEPENDENT_AMBULATORY_CARE_PROVIDER_SITE_OTHER): Payer: Medicare Other | Admitting: Neurology

## 2020-03-25 ENCOUNTER — Encounter: Payer: Self-pay | Admitting: Neurology

## 2020-03-25 ENCOUNTER — Telehealth: Payer: Self-pay

## 2020-03-25 ENCOUNTER — Other Ambulatory Visit: Payer: Self-pay

## 2020-03-25 VITALS — BP 183/104 | HR 104 | Ht 68.0 in | Wt 178.0 lb

## 2020-03-25 DIAGNOSIS — M47816 Spondylosis without myelopathy or radiculopathy, lumbar region: Secondary | ICD-10-CM | POA: Diagnosis not present

## 2020-03-25 DIAGNOSIS — G2581 Restless legs syndrome: Secondary | ICD-10-CM

## 2020-03-25 DIAGNOSIS — G629 Polyneuropathy, unspecified: Secondary | ICD-10-CM

## 2020-03-25 DIAGNOSIS — M170 Bilateral primary osteoarthritis of knee: Secondary | ICD-10-CM | POA: Diagnosis not present

## 2020-03-25 DIAGNOSIS — I10 Essential (primary) hypertension: Secondary | ICD-10-CM

## 2020-03-25 MED ORDER — NORTRIPTYLINE HCL 10 MG PO CAPS: 10.0000 mg | ORAL_CAPSULE | Freq: Every day | ORAL | 5 refills | Status: DC

## 2020-03-25 NOTE — Telephone Encounter (Signed)
Telephone call from pt, Pt wanted to know if she should stop taking her Pramipexole and just take the Nortriptyline or both   Please advise

## 2020-03-25 NOTE — Telephone Encounter (Signed)
If she reports no benefit with the pramipexole, then I would discontinue it.

## 2020-03-25 NOTE — Telephone Encounter (Signed)
Tried calling pt, No answer. LMOVM 

## 2020-03-25 NOTE — Patient Instructions (Signed)
1.  Start nortriptyline 10mg  at bedtime for nerve pain.  If no improvement in 6 weeks, contact me and we can increase dose 2.  Follow up 4 to 6 months

## 2020-03-27 ENCOUNTER — Telehealth: Payer: Self-pay | Admitting: Internal Medicine

## 2020-03-27 MED ORDER — VERAPAMIL HCL ER 120 MG PO TBCR: 120.0000 mg | EXTENDED_RELEASE_TABLET | Freq: Two times a day (BID) | ORAL | 3 refills | Status: DC

## 2020-03-27 NOTE — Telephone Encounter (Signed)
Returned call to Pt.   Per Pt she has had elevated heart rates last 4 days.  Pt stopped her amiodarone 3 months ago.  Advised Pt she is probably having breakthrough afib.  Offered appt with RU next Thursday.  Pt cannot keep that appt because her piano tuner is coming that day.  Offered afib clinic.  She does not want to go to afib clinic.  Appt made with RU for next Friday 04/03/20.  Will discuss any med changes between now and follow up appt.

## 2020-03-27 NOTE — Telephone Encounter (Signed)
Per Dr. Ladona Ridgel- Have Pt take verapamil 120 mg PO BID.  If continues to be out of rhythm would recommend DCCV be scheduled at f/u appt made for next week with RU.  Pt indicates understanding.  Thanked nurse for call back.

## 2020-03-27 NOTE — Telephone Encounter (Signed)
   Patient c/o Palpitations:  High priority if patient c/o lightheadedness, shortness of breath, or chest pain  1) How long have you had palpitations/irregular HR/ Afib? Are you having the symptoms now? 4 days  2) Are you currently experiencing lightheadedness, SOB or CP? None  3) Do you have a history of afib (atrial fibrillation) or irregular heart rhythm? Yes  4) Have you checked your BP or HR? (document readings if available): highest 150 bpm  5) Are you experiencing any other symptoms?   Pt said she's been having tachycardia off and on for the last 4 days. She said she can feel it on her chest her heart beat is racing. She doesn't know what to do and wanted to check in with Dr. Ladona Ridgel if she needs an appt to see him.

## 2020-04-02 NOTE — Progress Notes (Signed)
Cardiology Office Note Date:  04/03/2020  Patient ID:  Natasha [name], Natasha [name] February 11, 1933, MRN [id number] PCP:  Natasha Noble, MD  Cardiologist:  Natasha [name] [name] Electrophysiologist: Nat[name] [name]    Chief Complaint: palpitations  History of Present Illness: Natasha [name] [name] is a 86 y.o. female with history of HTN, HLD, GERD, chronic CHF (diastolic), AFib, Atach.  She comes in today to be seen for [name], last seen by him Aug 2021, cleared for knee surgery.  Mentioned class I HF symptoms, doing well outside her knee pain.  I have reviewed her last note by [name] in 2017, mentioned "she is drug intolerant of virtually all medications" had some success with verapamil back then NOTED: Drug HX Current Rx Pre-ABL inefficacy Pre-ABL intolerant Post-ABL inefficacy Post-ABL intolerant max dose/24h comments  sotalol  dofetilide x Refractory / DCCV  flecainide x x x 50 mg 25 mg BID  propafenone x x refractory  amiodarone  dronedarone x x  I gave her a choice as follows: If she believes that her A. fib burden is too high we could consider repeat catheter ablation, surgical ablation, or AV junction ablation with a pacemaker. If she believes that her fatigue is significant and limiting and would warrant either ablation or pacemaker we could do ambulatory monitoring to see whether or not she has chronotropic incompetence, or consider another ablation with the goal of reducing the dose of verapamil and by systolic. At this juncture she does not want any changes made and will continue with the current medical regimen. She does not need refills.   TODAY In the last 2 weeks she has noticed her HR elevated. Says his is the 1st time since off amiodarone last year that she has had rhythm trouble. She denies CP but is aware of her heart racing when it is going fast and does make her feel more tired, or "off".  No lightheaded, no near syncope or syncope. She has been watching her HR via her watch and reports  that it has been fluctuating between 80's-140's. She states until she called here she was not taking the verapamil at all, having it as a PRN medicine only and had not until now been having any. She was advised to take the verapamil 120mg  BID and given her appt here today.  Outside of her recent onset of tachycardia she has been feeling quite well, limited by her knee pain, has not yet gotten surgery, her husband needing some procedures 1st.  She is taking her eliquis BID without any missed doses, no bleeding or signs of bleeding     AFib hx AFib ablation 2014, 2015, Natasha [name], [name] Amiodarone started 2019 > stopped Nov 2020, 2/2 severe fatigue/malaise Also noted above  Past Medical History:  Diagnosis Date  . Chronic diastolic heart failure (HCC) 04/22/2015  . Complication of anesthesia   . Diverticular disease   . DJD (degenerative joint disease)   . Dysphagia   . GERD (gastroesophageal reflux disease)   . Hiatal hernia   . Hypertension   . Hypothyroidism   . Lower leg edema   . PAF (paroxysmal atrial fibrillation) (HCC)    s/p ablation x several times now with occasional episodes of PAF.   She has failed sotolol/dofetilide/flecainide/propafenone/Amio and dronedarone either with not controlling her PAF or having side effects  . PONV (postoperative nausea and vomiting)   . Restless leg syndrome   . Seasonal allergies     Past Surgical History:  Procedure  Laterality Date  . ABDOMINAL HYSTERECTOMY    . APPENDECTOMY    . ATRIAL FIBRILLATION ABLATION  09-06-13   2'14 -Natasha [name] "Bonsuie"  . BALLOON DILATION  03/03/2011   Procedure: BALLOON DILATION;  Surgeon: [name] Bumpers, MD;  [name];  Service: Endoscopy;  Laterality: N/A;  . bladder polyps    . BREAST EXCISIONAL BIOPSY Left   . BREAST SURGERY     left lumpectomy  . CARDIOVERSION     x3  . CARDIOVERSION N/A 12/30/2014   Procedure: CARDIOVERSION;  Surgeon: Natasha [name] Bunting, MD;  Location[facility] ENDOSCOPY;   Service: Cardiovascular;  Laterality: N/A;  . CARDIOVERSION N/A 04/23/2015   Procedure: CARDIOVERSION;  Surgeon: Natasha [name] Fair, MD;  Location: [facility] ENDOSCOPY;  Service: Cardiovascular;  Laterality: N/A;  . CATARACT EXTRACTION Left   . COLONOSCOPY WITH PROPOFOL N/A 09/24/2013   Procedure: COLONOSCOPY WITH PROPOFOL;  Surgeon: [name] Bumpers, MD;  Location: [facility] ENDOSCOPY;  Service: Endoscopy;  Laterality: N/A;  . ESOPHAGOGASTRODUODENOSCOPY  03/03/2011   Procedure: ESOPHAGOGASTRODUODENOSCOPY (EGD);  Surgeon: [name] Bumpers, MD;  [name];  Service: Endoscopy;  Laterality: N/A;  . EYE SURGERY     cataracat  . RHINOPLASTY    . RIGHT HEART CATHETERIZATION N/A 07/11/2014   Procedure: RIGHT HEART CATH;  Surgeon: Laurey Morale, MD;  Location: [facility] CATH LAB;  Service: Cardiovascular;  Laterality: N/A;  . ROTATOR CUFF REPAIR    . THYROIDECTOMY  1973  . TONSILLECTOMY  1942  . TUBAL LIGATION      Current Outpatient Medications  Medication Sig Dispense Refill  . ELIQUIS 5 MG TABS tablet TAKE 1 TABLET BY MOUTH 2 TIMES DAILY. 60 tablet 5  . esomeprazole (NEXIUM) 20 MG capsule Take 20 mg by mouth every 3 (three) days.     Marland Kitchen estradiol (CLIMARA - DOSED IN MG/24 HR) 0.025 mg/24hr patch Place 0.025 mg onto the skin once a week.    . furosemide (LASIX) 40 MG tablet TAKE 1 AND 1/2 TABLETS BY MOUTH DAILY. 90 tablet 3  . LORazepam (ATIVAN) 1 MG tablet Take 0.5-1 mg by mouth at bedtime as needed for sleep. For sleep    . Magnesium 500 MG CAPS Take 500 mg by mouth daily.    . Methylcobalamin (B-12) 1000 MCG TBDP Take by mouth.    . nortriptyline (PAMELOR) 10 MG capsule Take 1 capsule (10 mg total) by mouth at bedtime. 30 capsule 5  . potassium chloride (KLOR-CON) 10 MEQ tablet TAKE 1 TABLET BY MOUTH DAILY 90 tablet 3  . pramipexole (MIRAPEX) 0.125 MG tablet Take 2 tablets by mouth daily.    . Riboflavin 400 MG CAPS Take 1 capsule by mouth daily.    Marland Kitchen SYNTHROID 150 MCG tablet Take 150 mcg by mouth  daily.    . Vitamin D, Cholecalciferol, 50 MCG (2000 UT) CAPS Take 1 capsule by mouth daily.    . verapamil (CALAN-SR) 120 MG CR tablet Take 1 tablet (120 mg total) by mouth 2 (two) times daily as needed (PALPITATIOS). 180 tablet 3   No current facility-administered medications for this visit.    Allergies:   Codeine, Penicillins, Tetracycline, Tramadol, Digoxin, Digoxin and related, Erythromycin, Lyrica [pregabalin], Other, Sulfa antibiotics, Tetracyclines & related, Diltiazem, Diltiazem hcl, and Sulfites   Social History:  The patient  reports that she has never smoked. She has never used smokeless tobacco. She reports current alcohol use. She reports that she does not use drugs.   Family History:  The patient's  family history includes CVA in her father; Cancer in her brother; Heart failure in her father; Hypertension in her sister; Lung cancer in her father; Sudden death in her brother; Transient ischemic attack in her mother.  ROS:  Please see the history of present illness.    All other systems are reviewed and otherwise negative.   PHYSICAL EXAM:  VS:  BP (!) 148/94   Pulse (!) 142   Ht 5\' 8"  (1.727 m)   Wt 203 lb (92.1 kg)   SpO2 97%   BMI 30.87 kg/m  BMI: Body mass index is 30.87 kg/m. Well nourished, well developed, in no acute distress HEENT: normocephalic, atraumatic Neck: no JVD, carotid bruits or masses Cardiac:  RRR, tachycardic; no significant murmurs, no rubs, or gallops Lungs:  CTA b/l, no wheezing, rhonchi or rales Abd: soft, nontender MS: no deformity oratrophy Ext: trace edema b/l Skin: warm and dry, no rash Neuro:  No gross deficits appreciated Psych: euthymic mood, full affect   EKG:  Done today and reviewed by myself shows  Reviewed with Dr. , Atach 142bpm  01/15/2018; TTE Study Conclusions  - Left ventricle: The cavity size was mildly reduced. There was  mild focal basal hypertrophy of the septum. Systolic function was  normal. The  estimated ejection fraction was in the range of 55%  to 60%. Wall motion was normal; there were no regional wall  motion abnormalities. Doppler parameters are consistent with a  reversible restrictive pattern, indicative of decreased left  ventricular diastolic compliance and/or increased left atrial  pressure (grade 3 diastolic dysfunction).  - Aortic valve: There was no significant regurgitation.  - Mitral valve: There was mild regurgitation.  - Atrial septum: No defect or patent foramen ovale was identified.  - Tricuspid valve: There was moderate regurgitation.  - Pulmonic valve: There was mild regurgitation.  - Pulmonary arteries: Systolic pressure was within the normal  range.    03/08/2017: stress myoview  Nuclear stress EF: 80%.  There was no ST segment deviation noted during stress.  The study is normal.  This is a low risk study.  The left ventricular ejection fraction is hyperdynamic (>65%).   Breast attenuation no ischemia or infarct EF 80%    Recent Labs: 05/31/2019: TSH 1.09 11/14/2019: BUN 10; Creatinine, Ser 0.85; Hemoglobin 14.6; Platelets 212; Potassium 3.7; Sodium 139  No results found for requested labs within last 8760 hours.   CrCl cannot be calculated (Patient's most recent lab result is older than the maximum 21 days allowed.).   Wt Readings from Last 3 Encounters:  04/03/20 203 lb (92.1 kg)  03/25/20 178 lb (80.7 kg)  12/27/19 203 lb 9.6 oz (92.4 kg)     Other studies reviewed: Additional studies/records reviewed today include: summarized above  ASSESSMENT AND PLAN:  1. Paroxysmal AFib     CHA2DS2Vasc is 5, on Eliquis,  appropriately dosed  she has tried and been intolerant of virtually all AADs Her HR has been up/down, does not sound by her watch or symptoms that she has been persistently fast Dr. 02/26/20 recommends we set her up for DCCV, states they have discussed pace/ablat strategy though she has wante to avoid this. This is  her 1st bout in a year. She is minimally symptomatic with it currently  Will plan for DCCV Labs today She is instructed that if she finds that her HR becomes consistently or becomes more symptomatic prior to her DCCV she should seek attention at the ER for DCCV  Continue  BID verapamil for now, her BP has been elevated as well, once she is s/p DCCV she can resume PRN scheduled given it tends to make swell    2. HTN     Continue BID verapamil, asked to monitor at home    Disposition: F/u with [name] in 6 weeks, sooner if needed, may need to revisit pace/ablate if her tachy/AF buren increases again     Current medicines are reviewed at length with the patient today.  The patient did not have any concerns regarding medicines.  Norma Fredrickson, PA-C 04/03/2020 1:06 PM     [facility] [address] [place] [address] [phone] (office)  [phone] (fax)

## 2020-04-03 ENCOUNTER — Encounter: Payer: Self-pay | Admitting: Physician Assistant

## 2020-04-03 ENCOUNTER — Ambulatory Visit (INDEPENDENT_AMBULATORY_CARE_PROVIDER_SITE_OTHER): Payer: Medicare Other | Admitting: Physician Assistant

## 2020-04-03 ENCOUNTER — Encounter: Payer: Self-pay | Admitting: *Deleted

## 2020-04-03 ENCOUNTER — Other Ambulatory Visit: Payer: Self-pay

## 2020-04-03 VITALS — BP 148/94 | HR 142 | Ht 68.0 in | Wt 203.0 lb

## 2020-04-03 DIAGNOSIS — I471 Supraventricular tachycardia: Secondary | ICD-10-CM | POA: Diagnosis not present

## 2020-04-03 DIAGNOSIS — I10 Essential (primary) hypertension: Secondary | ICD-10-CM

## 2020-04-03 DIAGNOSIS — I4819 Other persistent atrial fibrillation: Secondary | ICD-10-CM | POA: Diagnosis not present

## 2020-04-03 MED ORDER — VERAPAMIL HCL ER 120 MG PO TBCR: 120.0000 mg | EXTENDED_RELEASE_TABLET | Freq: Two times a day (BID) | ORAL | 3 refills | Status: DC | PRN

## 2020-04-03 NOTE — Patient Instructions (Addendum)
Medication Instructions:    CONTINUE VERAPAMIL 120 MG  TWICE A DAY  UNTIL AFTER CARDIOVERSION   THEN RESUME TAKING VERAPAMIL 120 MG  TWICE A DAY  ONLY AS NEEDED   *If you need a refill on your cardiac medications before your next appointment, please call your pharmacy*   Lab Work BMET MAG AND CBC   COVID SCREENING( DIRECTIONS/MAP GIVEN ) DATE :12-27-21TIME:10.25am  If you have labs (blood work) drawn today and your tests are completely normal, you will receive your results only by: Marland Kitchen MyChart Message (if you have MyChart) OR . A paper copy in the mail If you have any lab test that is abnormal or we need to change your treatment, we will call you to review the results.   Testing/Procedures: Your physician has recommended that you have a Cardioversion (DCCV). Electrical Cardioversion uses a jolt of electricity to your heart either through paddles or wired patches attached to your chest. This is a controlled, usually prescheduled, procedure. Defibrillation is done under light anesthesia in the hospital, and you usually go home the day of the procedure. This is done to get your heart back into a normal rhythm. You are not awake for the procedure. Please see the instruction sheet given to you today.   Follow-Up: At [facility], you and your health needs are our priority.  As part of our continuing mission to provide you with exceptional heart care, we have created designated Provider Care Teams.  These Care Teams include your primary Cardiologist (physician) and Advanced Practice Providers (APPs -  Physician Assistants and Nurse Practitioners) who all work together to provide you with the care you need, when you need it.  We recommend signing up for the patient portal called "MyChart".  Sign up information is provided on this After Visit Summary.  MyChart is used to connect with patients for Virtual Visits (Telemedicine).  Patients are able to view lab/test results, encounter notes, upcoming  appointments, etc.  Non-urgent messages can be sent to your provider as well.   To learn more about what you can do with MyChart, go to [email or web address].    Your next appointment:    6 week(s)  POST CARDIOVERSION  AFTER 04-15-20   The format for your next appointment:   In Person  Provider:   Lewayne Bunting, MD   Other Instructions  HEART RATE CONTINUES TO STAY IN 140 BEATS PER MINUTE  BY TRACKER GO TO EMERGENCY DEPARTMENT

## 2020-04-04 LAB — BASIC METABOLIC PANEL
BUN/Creatinine Ratio: 18 (ref 12–28)
BUN: 14 mg/dL (ref 8–27)
CO2: 24 mmol/L (ref 20–29)
Calcium: 9.1 mg/dL (ref 8.7–10.3)
Chloride: 98 mmol/L (ref 96–106)
Creatinine, Ser: 0.77 mg/dL (ref 0.57–1.00)
GFR calc Af Amer: 81 mL/min/{1.73_m2} (ref >59)
GFR calc non Af Amer: 71 mL/min/{1.73_m2} (ref >59)
Glucose: 88 mg/dL (ref 65–99)
Potassium: 4.8 mmol/L (ref 3.5–5.2)
Sodium: 135 mmol/L (ref 134–144)

## 2020-04-04 LAB — CBC
Hematocrit: 45 % (ref 34.0–46.6)
Hemoglobin: 15.5 g/dL (ref 11.1–15.9)
MCH: 32.9 pg (ref 26.6–33.0)
MCHC: 34.4 g/dL (ref 31.5–35.7)
MCV: 96 fL (ref 79–97)
Platelets: 256 10*3/uL (ref 150–450)
RBC: 4.71 x10E6/uL (ref 3.77–5.28)
RDW: 12.4 % (ref 11.7–15.4)
WBC: 5.3 10*3/uL (ref 3.4–10.8)

## 2020-04-04 LAB — MAGNESIUM: Magnesium: 2.1 mg/dL (ref 1.6–2.3)

## 2020-04-08 ENCOUNTER — Other Ambulatory Visit: Payer: Self-pay | Admitting: Internal Medicine

## 2020-04-09 ENCOUNTER — Telehealth: Payer: Self-pay | Admitting: Internal Medicine

## 2020-04-09 NOTE — Telephone Encounter (Signed)
Patient states her heart rate has been in the 70-80's for the last 3-4 days so she doesn't think she needs a cardioversion any longer. She also is wanting to know when she can change her verapamil back to PRN or if she should continue to take it twice a day.  Will forward to [facility] for advisement.

## 2020-04-09 NOTE — Telephone Encounter (Signed)
Eliquis 5mg  refill request received. Patient is 86 years old, weight-92.1kg, Crea-0.77 on 04/03/2020, Diagnosis-Afib, and last seen by 04/05/2020, PA on 04/03/2020. Dose is appropriate based on dosing criteria. Will send in refill to requested pharmacy.

## 2020-04-09 NOTE — Telephone Encounter (Signed)
Natasha [name] is calling stating she no longer feels she is needing her upcoming procedure and wishes to cancel. Please advise.

## 2020-04-09 NOTE — Telephone Encounter (Signed)
Spoke with pt and reviewed Renee's orders to decrease Verapamil to daily, continue to monitor HR and f/u in office in 1 week.  Pt states understand.  appt scheduled for Thursday 04/16/2020.  Covid screen cancelled at pt's request.  outpt cardioversion scheduled for 04/15/2020 has been cancelled as instructed.

## 2020-04-13 ENCOUNTER — Other Ambulatory Visit (HOSPITAL_COMMUNITY): Payer: Medicare Other

## 2020-04-15 ENCOUNTER — Ambulatory Visit (HOSPITAL_COMMUNITY): Admission: RE | Admit: 2020-04-15 | Payer: Medicare Other | Source: Home / Self Care | Admitting: Cardiology

## 2020-04-15 ENCOUNTER — Encounter (HOSPITAL_COMMUNITY): Admission: RE | Payer: Self-pay | Source: Home / Self Care

## 2020-04-15 SURGERY — CARDIOVERSION
Anesthesia: Monitor Anesthesia Care

## 2020-04-16 ENCOUNTER — Other Ambulatory Visit: Payer: Self-pay

## 2020-04-16 ENCOUNTER — Ambulatory Visit (INDEPENDENT_AMBULATORY_CARE_PROVIDER_SITE_OTHER): Payer: Medicare Other | Admitting: Physician Assistant

## 2020-04-16 ENCOUNTER — Encounter: Payer: Self-pay | Admitting: Physician Assistant

## 2020-04-16 VITALS — BP 150/80 | HR 84 | Ht 68.0 in | Wt 202.0 lb

## 2020-04-16 DIAGNOSIS — I471 Supraventricular tachycardia: Secondary | ICD-10-CM | POA: Diagnosis not present

## 2020-04-16 DIAGNOSIS — I480 Paroxysmal atrial fibrillation: Secondary | ICD-10-CM

## 2020-04-16 DIAGNOSIS — I10 Essential (primary) hypertension: Secondary | ICD-10-CM | POA: Diagnosis not present

## 2020-04-16 NOTE — Progress Notes (Signed)
Cardiology Office Note Date:  04/16/2020  Patient ID:  Natasha [name], Natasha [name] [birth year], MRN [id number] PCP:  Marden Noble, MD  Cardiologist:  Dr. Mayford Knife Electrophysiologist: Dr. Ladona Ridgel    Chief Complaint: planned f/u  History of Present Illness: Natasha [name] is a 86 y.o. female with history of HTN, HLD, GERD, chronic CHF (diastolic), AFib, Atach.  She comes in today to be seen for dr. Ladona Ridgel, last seen by him Aug 2021, cleared for knee surgery.  Mentioned class I HF symptoms, doing well outside her knee pain.  I have reviewed her last note by Dr. Macon Large in 2017, mentioned "she is drug intolerant of virtually all medications" had some success with verapamil back then NOTED: Drug HX Current Rx Pre-ABL inefficacy Pre-ABL intolerant Post-ABL inefficacy Post-ABL intolerant max dose/24h comments  sotalol  dofetilide x Refractory / DCCV  flecainide x x x 50 mg 25 mg BID  propafenone x x refractory  amiodarone  dronedarone x x  I gave her a choice as follows: If she believes that her A. fib burden is too high we could consider repeat catheter ablation, surgical ablation, or AV junction ablation with a pacemaker. If she believes that her fatigue is significant and limiting and would warrant either ablation or pacemaker we could do ambulatory monitoring to see whether or not she has chronotropic incompetence, or consider another ablation with the goal of reducing the dose of verapamil and by systolic. At this juncture she does not want any changes made and will continue with the current medical regimen. She does not need refills.   I saw her 04/13/20 In the last 2 weeks she has noticed her HR elevated. Says his is the 1st time since off amiodarone last year that she has had rhythm trouble. She denies CP but is aware of her heart racing when it is going fast and does make her feel more tired, or "off".  No lightheaded, no near syncope or syncope. She has been watching her HR via her watch and  reports that it has been fluctuating between 80's-140's. She states until she called here she was not taking the verapamil at all, having it as a PRN medicine only and had not until now been having any. She was advised to take the verapamil 120mg  BID and given her appt here today. Outside of her recent onset of tachycardia she has been feeling quite well, limited by her knee pain, has not yet gotten surgery, her husband needing some procedures 1st. She is taking her eliquis BID without any missed doses, no bleeding or signs of bleeding Reviewed with Dr. , ECG c/w her known AT, mentioned ty;oically she reverts on her own, though recommended that she be arranged for DCCV. She was maintained in BID CCB  The patient subsequently called with normalized HRs and requested that her verapamil be reduced, her DCCV canceled and CCB made to daily (she prefers PRN use)  TODAY She is doing OK cardiac-wise. She has terrible back and b/l knee pain and these are her primary physical limitations. She has not had any further tachycardia by symptoms or her watch. No CP, no SOB. She would like to go back to PRN verapamil. No dizziness, near syncope or syncope, occassionally when 1st up and around feels a little unsteady. Using her walker helps her quite a bit No bleeding or signs of bleeding  She says her BP at home is not as high as it is here. I rechecked her BP and  got 150/86. She says when she checks is usually 130's/70's, intermittently she will get a reading like today though relaxes and checks again a few minutes later and is better.    AFib hx AFib ablation 2014, 2015, [facility], Dr. Macon Large AAD Amiodarone started 2019 > stopped Nov 2020, 2/2 severe fatigue/malaise Also noted above  Past Medical History:  Diagnosis Date  . Chronic diastolic heart failure (HCC) 04/22/2015  . Complication of anesthesia   . Diverticular disease   . DJD (degenerative joint disease)   . Dysphagia   . GERD  (gastroesophageal reflux disease)   . Hiatal hernia   . Hypertension   . Hypothyroidism   . Lower leg edema   . PAF (paroxysmal atrial fibrillation) (HCC)    s/p ablation x several times now with occasional episodes of PAF.   She has failed sotolol/dofetilide/flecainide/propafenone/Amio and dronedarone either with not controlling her PAF or having side effects  . PONV (postoperative nausea and vomiting)   . Restless leg syndrome   . Seasonal allergies     Past Surgical History:  Procedure Laterality Date  . ABDOMINAL HYSTERECTOMY    . APPENDECTOMY    . ATRIAL FIBRILLATION ABLATION  09-06-13   2'14 -[facility]  . BALLOON DILATION  03/03/2011   Procedure: BALLOON DILATION;  Surgeon: Charolett Bumpers, MD;  Location: [facility];  Service: Endoscopy;  Laterality: N/A;  . bladder polyps    . BREAST EXCISIONAL BIOPSY Left   . BREAST SURGERY     left lumpectomy  . CARDIOVERSION     x3  . CARDIOVERSION N/A 12/30/2014   Procedure: CARDIOVERSION;  Surgeon: Lewayne Bunting, MD;  Location: [facility] ENDOSCOPY;  Service: Cardiovascular;  Laterality: N/A;  . CARDIOVERSION N/A 04/23/2015   Procedure: CARDIOVERSION;  Surgeon: Thurmon Fair, MD;  Location: [facility] ENDOSCOPY;  Service: Cardiovascular;  Laterality: N/A;  . CATARACT EXTRACTION Left   . COLONOSCOPY WITH PROPOFOL N/A 09/24/2013   Procedure: COLONOSCOPY WITH PROPOFOL;  Surgeon: Charolett Bumpers, MD;  Location: [facility] ENDOSCOPY;  Service: Endoscopy;  Laterality: N/A;  . ESOPHAGOGASTRODUODENOSCOPY  03/03/2011   Procedure: ESOPHAGOGASTRODUODENOSCOPY (EGD);  Surgeon: Charolett Bumpers, MD;  Location: [facility] ENDOSCOPY;  Service: Endoscopy;  Laterality: N/A;  . EYE SURGERY     cataracat  . RHINOPLASTY    . RIGHT HEART CATHETERIZATION N/A 07/11/2014   Procedure: RIGHT HEART CATH;  Surgeon: Laurey Morale, MD;  Location: [facility] CATH LAB;  Service: Cardiovascular;  Laterality: N/A;  . ROTATOR CUFF REPAIR    . THYROIDECTOMY  1973  . TONSILLECTOMY  1942  . TUBAL  LIGATION      Current Outpatient Medications  Medication Sig Dispense Refill  . butalbital-acetaminophen-caffeine (FIORICET) 50-325-40 MG tablet Take 1 tablet by mouth 2 (two) times daily as needed for headache.    Marland Kitchen ELIQUIS 5 MG TABS tablet TAKE 1 TABLET BY MOUTH 2 TIMES DAILY. 60 tablet 5  . esomeprazole (NEXIUM) 20 MG capsule Take 20 mg by mouth every 3 (three) days.     Marland Kitchen estradiol (CLIMARA - DOSED IN MG/24 HR) 0.025 mg/24hr patch Place 0.025 mg onto the skin every Sunday.    . furosemide (LASIX) 40 MG tablet Take 40 mg by mouth daily.    Marland Kitchen LORazepam (ATIVAN) 1 MG tablet Take 0.5-1 mg by mouth at bedtime as needed for sleep. For sleep    . Magnesium 500 MG CAPS Take 500 mg by mouth daily.    . Methylcobalamin (B-12) 1000 MCG TBDP Take 1,000 mcg  by mouth daily.    . nortriptyline (PAMELOR) 10 MG capsule Take 1 capsule (10 mg total) by mouth at bedtime. 30 capsule 5  . potassium chloride (KLOR-CON) 10 MEQ tablet TAKE 1 TABLET BY MOUTH DAILY 90 tablet 3  . pramipexole (MIRAPEX) 0.125 MG tablet Take 0.25 mg by mouth daily at 6 PM.    . Riboflavin 400 MG CAPS Take 400 mg by mouth daily.    Marland Kitchen SYNTHROID 150 MCG tablet Take 150 mcg by mouth daily before breakfast.    . verapamil (CALAN) 120 MG tablet Take 120 mg by mouth as needed. For palpitations or increased heart rate    . verapamil (CALAN-SR) 120 MG CR tablet Take 1 tablet (120 mg total) by mouth 2 (two) times daily as needed (PALPITATIOS). (Patient taking differently: Take 120 mg by mouth 2 (two) times daily as needed (PALPITATIONS/tachycardia).) 180 tablet 3  . Vitamin D, Cholecalciferol, 50 MCG (2000 UT) CAPS Take 2,000 Units by mouth daily.     No current facility-administered medications for this visit.    Allergies:   Codeine, Penicillins, Tetracycline, Tramadol, Digoxin, Digoxin and related, Erythromycin, Other, Sulfa antibiotics, Tetracyclines & related, Diltiazem hcl, Lyrica [pregabalin], and Sulfites   Social History:  The  patient  reports that she has never smoked. She has never used smokeless tobacco. She reports current alcohol use. She reports that she does not use drugs.   Family History:  The patient's family history includes CVA in her father; Cancer in her brother; Heart failure in her father; Hypertension in her sister; Lung cancer in her father; Sudden death in her brother; Transient ischemic attack in her mother.  ROS:  Please see the history of present illness.    All other systems are reviewed and otherwise negative.   PHYSICAL EXAM:  VS:  BP (!) 150/80   Pulse 84   Ht 5\' 8"  (1.727 m)   Wt 202 lb (91.6 kg)   SpO2 98%   BMI 30.71 kg/m  BMI: Body mass index is 30.71 kg/m. Well nourished, well developed, in no acute distress HEENT: normocephalic, atraumatic Neck: no JVD, carotid bruits or masses Cardiac:  RRR, tachycardic; no significant murmurs, no rubs, or gallops Lungs:  CTA b/l, no wheezing, rhonchi or rales Abd: soft, nontender MS: no deformity oratrophy Ext: trace if any edema b/l Skin: warm and dry, no rash Neuro:  No gross deficits appreciated Psych: euthymic mood, full affect   EKG:  Done today and reviewed by myself shows  04/03/20: Reviewed with Dr. Ladona Ridgel, Atach 142bpm  01/15/2018; TTE Study Conclusions  - Left ventricle: The cavity size was mildly reduced. There was  mild focal basal hypertrophy of the septum. Systolic function was  normal. The estimated ejection fraction was in the range of 55%  to 60%. Wall motion was normal; there were no regional wall  motion abnormalities. Doppler parameters are consistent with a  reversible restrictive pattern, indicative of decreased left  ventricular diastolic compliance and/or increased left atrial  pressure (grade 3 diastolic dysfunction).  - Aortic valve: There was no significant regurgitation.  - Mitral valve: There was mild regurgitation.  - Atrial septum: No defect or patent foramen ovale was identified.  -  Tricuspid valve: There was moderate regurgitation.  - Pulmonic valve: There was mild regurgitation.  - Pulmonary arteries: Systolic pressure was within the normal  range.    03/08/2017: stress myoview  Nuclear stress EF: 80%.  There was no ST segment deviation noted during stress.  The study is normal.  This is a low risk study.  The left ventricular ejection fraction is hyperdynamic (>65%).   Breast attenuation no ischemia or infarct EF 80%    Recent Labs: 05/31/2019: TSH 1.09 04/03/2020: BUN 14; Creatinine, Ser 0.77; Hemoglobin 15.5; Magnesium 2.1; Platelets 256; Potassium 4.8; Sodium 135  No results found for requested labs within last 8760 hours.   Estimated Creatinine Clearance: 60.9 mL/min (by C-G formula based on SCr of 0.77 mg/dL).   Wt Readings from Last 3 Encounters:  04/16/20 202 lb (91.6 kg)  04/03/20 203 lb (92.1 kg)  03/25/20 178 lb (80.7 kg)     Other studies reviewed: Additional studies/records reviewed today include: summarized above  ASSESSMENT AND PLAN:  1. Paroxysmal AFib 2. Atrial tachycardia     CHA2DS2Vasc is 5, on Eliquis,  appropriately dosed      She would really prefer to have to take the verpamil PRN. We will use this strategy, she will skip tomorrow, take a dose Saturday then off > daily PRN for palpitations /tachycardia   3. HTN     Has been consistently elevated here though she assures me is better at home     She is asked to take her BP daily and if routinely 140/80 or higher to please call and let us know.     She has a visit with Dr. Ladona Ridgel in Feb, we will keep this in place   Disposition: as above    Current medicines are reviewed at length with the patient today.  The patient did not have any concerns regarding medicines.  Norma Fredrickson, PA-C 04/16/2020 2:54 PM     [facility] [address] [place] [address] [phone] (office)  [phone] (fax)

## 2020-04-16 NOTE — Patient Instructions (Addendum)
Medication Instructions:  Your physician has recommended you make the following change in your medication:  -- DECREASE Verapamil to every other day until Saturday (1/1) then you can take it as needed  *If you need a refill on your cardiac medications before your next appointment, please call your pharmacy*  Follow-Up: At [facility], you and your health needs are our priority.  As part of our continuing mission to provide you with exceptional heart care, we have created designated Provider Care Teams.  These Care Teams include your primary Cardiologist (physician) and Advanced Practice Providers (APPs -  Physician Assistants and Nurse Practitioners) who all work together to provide you with the care you need, when you need it.  We recommend signing up for the patient portal called "MyChart".  Sign up information is provided on this After Visit Summary.  MyChart is used to connect with patients for Virtual Visits (Telemedicine).  Patients are able to view lab/test results, encounter notes, upcoming appointments, etc.  Non-urgent messages can be sent to your provider as well.   To learn more about what you can do with MyChart, go to [email or web address].    Your next appointment:   Your physician recommends that you keep your scheduled follow-up appointment in February 2022 with Dr. Ladona Ridgel.  The format for your next appointment:   In Person with Francis Dowse, PA-C  -- Please check your blood pressures daily, if your blood pressures are consistently HIGHER then 140/80 please call and make Dr. Ladona Ridgel and Francis Dowse aware --

## 2020-04-27 ENCOUNTER — Telehealth: Payer: Self-pay | Admitting: Physician Assistant

## 2020-04-27 NOTE — Telephone Encounter (Signed)
Pt c/o BP issue: STAT if pt c/o blurred vision, one-sided weakness or slurred speech  1. What are your last 5 BP readings? 166/98 before that 170/101, 157/92  2. Are you having any other symptoms (ex. Dizziness, headache, blurred vision, passed out)?  No  3. What is your BP issue? BP going up and down

## 2020-04-28 DIAGNOSIS — K594 Anal spasm: Secondary | ICD-10-CM | POA: Diagnosis not present

## 2020-04-28 MED ORDER — VERAPAMIL HCL ER 120 MG PO TBCR: EXTENDED_RELEASE_TABLET | ORAL | 3 refills | Status: DC

## 2020-04-28 NOTE — Telephone Encounter (Signed)
Called pt and reviewed recommendations.  See phone note for more information.

## 2020-04-28 NOTE — Telephone Encounter (Signed)
Patient calling back.   °

## 2020-04-28 NOTE — Telephone Encounter (Signed)
Pt sent MyChart message and has corresponded through there.  Message sent to Francis Dowse, PA-C.

## 2020-04-28 NOTE — Telephone Encounter (Signed)
Sheilah Pigeon, PA-C  You 18 minutes ago (4:35 PM)     I had sent a message to [facility] to address this, but she is out. Please have her resume verapamil 120mg  daily rather then just PRN for palpitations. She can take an additional PRN dose for tachycardia/palpitations (so BID) if needed.   Thanks  renee      Spoke with pt and reviewed recommendations per Renee.  Pt verbalized understanding and was in agreement with plan.

## 2020-05-07 DIAGNOSIS — I119 Hypertensive heart disease without heart failure: Secondary | ICD-10-CM | POA: Diagnosis not present

## 2020-05-07 DIAGNOSIS — G43009 Migraine without aura, not intractable, without status migrainosus: Secondary | ICD-10-CM | POA: Diagnosis not present

## 2020-05-07 DIAGNOSIS — E038 Other specified hypothyroidism: Secondary | ICD-10-CM | POA: Diagnosis not present

## 2020-05-07 DIAGNOSIS — E890 Postprocedural hypothyroidism: Secondary | ICD-10-CM | POA: Diagnosis not present

## 2020-05-07 DIAGNOSIS — I10 Essential (primary) hypertension: Secondary | ICD-10-CM | POA: Diagnosis not present

## 2020-05-07 DIAGNOSIS — I4891 Unspecified atrial fibrillation: Secondary | ICD-10-CM | POA: Diagnosis not present

## 2020-05-07 DIAGNOSIS — I5032 Chronic diastolic (congestive) heart failure: Secondary | ICD-10-CM | POA: Diagnosis not present

## 2020-05-07 DIAGNOSIS — M170 Bilateral primary osteoarthritis of knee: Secondary | ICD-10-CM | POA: Diagnosis not present

## 2020-05-07 DIAGNOSIS — J449 Chronic obstructive pulmonary disease, unspecified: Secondary | ICD-10-CM | POA: Diagnosis not present

## 2020-05-07 DIAGNOSIS — E039 Hypothyroidism, unspecified: Secondary | ICD-10-CM | POA: Diagnosis not present

## 2020-05-07 DIAGNOSIS — K219 Gastro-esophageal reflux disease without esophagitis: Secondary | ICD-10-CM | POA: Diagnosis not present

## 2020-06-04 ENCOUNTER — Encounter: Payer: Self-pay | Admitting: Internal Medicine

## 2020-06-04 ENCOUNTER — Ambulatory Visit (INDEPENDENT_AMBULATORY_CARE_PROVIDER_SITE_OTHER): Payer: Medicare Other | Admitting: Internal Medicine

## 2020-06-04 ENCOUNTER — Other Ambulatory Visit: Payer: Self-pay

## 2020-06-04 VITALS — BP 146/74 | HR 73 | Ht 68.0 in | Wt 193.6 lb

## 2020-06-04 DIAGNOSIS — I4819 Other persistent atrial fibrillation: Secondary | ICD-10-CM | POA: Diagnosis not present

## 2020-06-04 DIAGNOSIS — I10 Essential (primary) hypertension: Secondary | ICD-10-CM | POA: Diagnosis not present

## 2020-06-04 DIAGNOSIS — I5032 Chronic diastolic (congestive) heart failure: Secondary | ICD-10-CM | POA: Diagnosis not present

## 2020-06-04 NOTE — Progress Notes (Signed)
HPI Mrs. Bruso returns today for follow-up of paroxysmal atrial fibrillation, and hypertension. She is status post ablation remotely twice. She was placed on amiodarone secondary to recurrent atrial fibrillation and flutter. This was discontinued secondary to severe fatigue and malaise. She has developed worsening musculoskeletal pain and is considering knee replacement surgery. She has not fallen. She denies syncope or chest pain. Minimal shortness of breath except when she goes out of rhythm. She had recurrent atrial fib but reverted back to NSR spontaneously. Allergies  Allergen Reactions  . Codeine Nausea And Vomiting and Nausea Only  . Penicillins Hives    Has patient had a PCN reaction causing immediate rash, facial/tongue/throat swelling, SOB or lightheadedness with hypotension: No Has patient had a PCN reaction causing severe rash involving mucus membranes or skin necrosis: No Has patient had a PCN reaction that required hospitalization No Has patient had a PCN reaction occurring within the last 10 years: No If all of the above answers are "NO", then may proceed with Cephalosporin use.  . Tetracycline Rash  . Tramadol Nausea Only  . Digoxin Other (See Comments)    Flushed neck and face  . Digoxin And Related Other (See Comments)    ' Makes my face blood red"  . Erythromycin Other (See Comments) and Hives    Flushing on face  . Other Other (See Comments)    ECG leads cause skin irritation. ECG leads cause skin irritation.  . Sulfa Antibiotics Other (See Comments)    PRESERVATIVE OF SOME FOODS- MAKES FLUSHES TO FACE AND NECK  . Tetracyclines & Related Other (See Comments)    "Sores on legs"  . Diltiazem Hcl Rash    Rash with long acting form  . Lyrica [Pregabalin] Rash  . Sulfites Rash    PRESERVATIVE OF SOME FOODS- MAKES FLUSHES TO FACE AND NECK PRESERVATIVE OF SOME FOODS- MAKES FLUSHES TO FACE AND NECK     Current Outpatient Medications  Medication Sig Dispense  Refill  . butalbital-acetaminophen-caffeine (FIORICET) 50-325-40 MG tablet Take 1 tablet by mouth 2 (two) times daily as needed for headache.    Marland Kitchen ELIQUIS 5 MG TABS tablet TAKE 1 TABLET BY MOUTH 2 TIMES DAILY. 60 tablet 5  . esomeprazole (NEXIUM) 20 MG capsule Take 20 mg by mouth every 3 (three) days.     Marland Kitchen estradiol (CLIMARA - DOSED IN MG/24 HR) 0.025 mg/24hr patch Place 0.025 mg onto the skin every Sunday.    . furosemide (LASIX) 40 MG tablet Take 40 mg by mouth daily.    Marland Kitchen gabapentin (NEURONTIN) 600 MG tablet Take 600 mg by mouth 3 (three) times daily.    Marland Kitchen LORazepam (ATIVAN) 1 MG tablet Take 0.5-1 mg by mouth at bedtime as needed for sleep. For sleep    . Magnesium 500 MG CAPS Take 500 mg by mouth daily.    . Methylcobalamin (B-12) 1000 MCG TBDP Take 1,000 mcg by mouth daily.    . nortriptyline (PAMELOR) 10 MG capsule Take 1 capsule (10 mg total) by mouth at bedtime. 30 capsule 5  . potassium chloride (KLOR-CON) 10 MEQ tablet TAKE 1 TABLET BY MOUTH DAILY 90 tablet 3  . pramipexole (MIRAPEX) 0.125 MG tablet Take 0.25 mg by mouth daily at 6 PM.    . Riboflavin 400 MG CAPS Take 400 mg by mouth daily.    Marland Kitchen SYNTHROID 150 MCG tablet Take 150 mcg by mouth daily before breakfast.    . verapamil (CALAN) 120 MG tablet  Take 120 mg by mouth as needed. For palpitations or increased heart rate    . verapamil (CALAN-SR) 120 MG CR tablet Take one tablet by mouth daily.  You may take one additional dose daily as needed for increased palpitations. 180 tablet 3  . Vitamin D, Cholecalciferol, 50 MCG (2000 UT) CAPS Take 2,000 Units by mouth daily.     No current facility-administered medications for this visit.     Past Medical History:  Diagnosis Date  . Chronic diastolic heart failure (HCC) 04/22/2015  . Complication of anesthesia   . Diverticular disease   . DJD (degenerative joint disease)   . Dysphagia   . GERD (gastroesophageal reflux disease)   . Hiatal hernia   . Hypertension   . Hypothyroidism    . Lower leg edema   . PAF (paroxysmal atrial fibrillation) (HCC)    s/p ablation x several times now with occasional episodes of PAF.   She has failed sotolol/dofetilide/flecainide/propafenone/Amio and dronedarone either with not controlling her PAF or having side effects  . PONV (postoperative nausea and vomiting)   . Restless leg syndrome   . Seasonal allergies     ROS:   All systems reviewed and negative except as noted in the HPI.   Past Surgical History:  Procedure Laterality Date  . ABDOMINAL HYSTERECTOMY    . APPENDECTOMY    . ATRIAL FIBRILLATION ABLATION  09-06-13   2'14 -[facility]  . BALLOON DILATION  03/03/2011   Procedure: BALLOON DILATION;  Surgeon: Charolett Bumpers, MD;  Location: [facility] ENDOSCOPY;  Service: Endoscopy;  Laterality: N/A;  . bladder polyps    . BREAST EXCISIONAL BIOPSY Left   . BREAST SURGERY     left lumpectomy  . CARDIOVERSION     x3  . CARDIOVERSION N/A 12/30/2014   Procedure: CARDIOVERSION;  Surgeon: Lewayne Bunting, MD;  Location: [facility] ENDOSCOPY;  Service: Cardiovascular;  Laterality: N/A;  . CARDIOVERSION N/A 04/23/2015   Procedure: CARDIOVERSION;  Surgeon: Thurmon Fair, MD;  Location: [facility] ENDOSCOPY;  Service: Cardiovascular;  Laterality: N/A;  . CATARACT EXTRACTION Left   . COLONOSCOPY WITH PROPOFOL N/A 09/24/2013   Procedure: COLONOSCOPY WITH PROPOFOL;  Surgeon: Charolett Bumpers, MD;  Location: [facility] ENDOSCOPY;  Service: Endoscopy;  Laterality: N/A;  . ESOPHAGOGASTRODUODENOSCOPY  03/03/2011   Procedure: ESOPHAGOGASTRODUODENOSCOPY (EGD);  Surgeon: Charolett Bumpers, MD;  Location: [facility] ENDOSCOPY;  Service: Endoscopy;  Laterality: N/A;  . EYE SURGERY     cataracat  . RHINOPLASTY    . RIGHT HEART CATHETERIZATION N/A 07/11/2014   Procedure: RIGHT HEART CATH;  Surgeon: Laurey Morale, MD;  Location: [facility] CATH LAB;  Service: Cardiovascular;  Laterality: N/A;  . ROTATOR CUFF REPAIR    . THYROIDECTOMY  1973  . TONSILLECTOMY  1942  . TUBAL LIGATION        Family History  Problem Relation Age of Onset  . Transient ischemic attack Mother   . Heart failure Father   . CVA Father   . Lung cancer Father   . Hypertension Sister   . Cancer Brother   . Sudden death Brother   . Anesthesia problems Neg Hx   . Hypotension Neg Hx   . Malignant hyperthermia Neg Hx   . Pseudochol deficiency Neg Hx   . Heart attack Neg Hx      Social History   Socioeconomic History  . Marital status: Married    Spouse name: Not on file  . Number of children: Not on file  .  Years of education: Not on file  . Highest education level: Not on file  Occupational History  . Occupation: retired  Tobacco Use  . Smoking status: Never Smoker  . Smokeless tobacco: Never Used  Vaping Use  . Vaping Use: Never used  Substance and Sexual Activity  . Alcohol use: Yes    Alcohol/week: 0.0 standard drinks    Comment: rare wine  . Drug use: No  . Sexual activity: Never    Birth control/protection: Post-menopausal  Other Topics Concern  . Not on file  Social History Narrative   Right handed   Two story home   Drinks caffeine   Social Determinants of Health   Financial Resource Strain: Not on file  Food Insecurity: Not on file  Transportation Needs: Not on file  Physical Activity: Not on file  Stress: Not on file  Social Connections: Not on file  Intimate Partner Violence: Not on file     BP (!) 146/74   Pulse 73   Ht 5\' 8"  (1.727 m)   Wt 193 lb 9.6 oz (87.8 kg)   SpO2 98%   BMI 29.44 kg/m   Physical Exam:  Well appearing NAD HEENT: Unremarkable Neck:  No JVD, no thyromegally Lymphatics:  No adenopathy Back:  No CVA tenderness Lungs:  Clear with no wheezes HEART:  Regular rate rhythm, no murmurs, no rubs, no clicks Abd:  soft, positive bowel sounds, no organomegally, no rebound, no guarding Ext:  2 plus pulses, no edema, no cyanosis, no clubbing Skin:  No rashes no nodules Neuro:  CN II through XII intact, motor grossly intact  EKG -  NSR with first degree AV block  Assess/Plan: 1. PAF - she is maintaining NSR. No change in meds. 2. HTN - her bp is well controlled.  3. Diastolic heart failure - her symptoms are class 2A when she is in rhythm and 3B in atrial fib. Continue current treatment. 4. Coags - she has not had any bleeding on systemic anti-coagulation.  Akia Montalban,MD

## 2020-06-04 NOTE — Patient Instructions (Addendum)
Medication Instructions:  °Your physician recommends that you continue on your current medications as directed. Please refer to the Current Medication list given to you today. ° °Labwork: °None ordered. ° °Testing/Procedures: °None ordered. ° °Follow-Up: °Your physician wants you to follow-up in: 6 months with Gregg Taylor, MD or one of the following Advanced Practice Providers on your designated Care Team:   °· Amber Seiler, NP °· Renee Ursuy, PA-C °· Michael "Andy" Tillery, PA-C ° ° °Any Other Special Instructions Will Be Listed Below (If Applicable). ° °If you need a refill on your cardiac medications before your next appointment, please call your pharmacy.  ° ° ° ° ° °

## 2020-06-19 ENCOUNTER — Telehealth: Payer: Self-pay | Admitting: Physician Assistant

## 2020-06-19 NOTE — Telephone Encounter (Signed)
Called patient in regardrs to her mychart message Reports feelig "ok" but tired No CP, no near syncope or syncope. She reads me 124/86, 129bpm 129/87, 88 131/101, 125 137/100, 125 101/77, 83 103/73, 79  She is taking the verapamil 120mg  BID Does not want to consider metoprolol again 2/2 her hair falling out  For now, she will stay the course, I will reach out to Dr. for his help and recommendations She was advised that should she begin to feel poorly to seek attention.  Ladona Ridgel, PA-C

## 2020-06-23 DIAGNOSIS — K594 Anal spasm: Secondary | ICD-10-CM | POA: Diagnosis not present

## 2020-07-01 DIAGNOSIS — I10 Essential (primary) hypertension: Secondary | ICD-10-CM | POA: Diagnosis not present

## 2020-07-01 DIAGNOSIS — G43009 Migraine without aura, not intractable, without status migrainosus: Secondary | ICD-10-CM | POA: Diagnosis not present

## 2020-07-01 DIAGNOSIS — J449 Chronic obstructive pulmonary disease, unspecified: Secondary | ICD-10-CM | POA: Diagnosis not present

## 2020-07-01 DIAGNOSIS — E038 Other specified hypothyroidism: Secondary | ICD-10-CM | POA: Diagnosis not present

## 2020-07-01 DIAGNOSIS — K219 Gastro-esophageal reflux disease without esophagitis: Secondary | ICD-10-CM | POA: Diagnosis not present

## 2020-07-01 DIAGNOSIS — E890 Postprocedural hypothyroidism: Secondary | ICD-10-CM | POA: Diagnosis not present

## 2020-07-01 DIAGNOSIS — I5032 Chronic diastolic (congestive) heart failure: Secondary | ICD-10-CM | POA: Diagnosis not present

## 2020-07-01 DIAGNOSIS — I4891 Unspecified atrial fibrillation: Secondary | ICD-10-CM | POA: Diagnosis not present

## 2020-07-07 DIAGNOSIS — Z961 Presence of intraocular lens: Secondary | ICD-10-CM | POA: Diagnosis not present

## 2020-07-07 DIAGNOSIS — H5201 Hypermetropia, right eye: Secondary | ICD-10-CM | POA: Diagnosis not present

## 2020-07-07 DIAGNOSIS — H52203 Unspecified astigmatism, bilateral: Secondary | ICD-10-CM | POA: Diagnosis not present

## 2020-07-07 DIAGNOSIS — H538 Other visual disturbances: Secondary | ICD-10-CM | POA: Diagnosis not present

## 2020-07-07 DIAGNOSIS — H5212 Myopia, left eye: Secondary | ICD-10-CM | POA: Diagnosis not present

## 2020-07-07 DIAGNOSIS — H524 Presbyopia: Secondary | ICD-10-CM | POA: Diagnosis not present

## 2020-07-08 ENCOUNTER — Telehealth: Payer: Self-pay | Admitting: *Deleted

## 2020-07-08 NOTE — Telephone Encounter (Signed)
Spoke with patient who hasn't been taking verapamil in a week. She says after being off medication and constantly watching her BP/HR which  has been stable and no problems. She states her swelling in her legs is minimal as of now and the compression stockings are not a help as much as just keeping her legs elevated. She would let us know if she has any problems with her BP/HR while remaining off medication and feel she needs to start back taking.

## 2020-07-08 NOTE — Telephone Encounter (Signed)
-----   Message from Sheilah Pigeon, [place] sent at 07/08/2020  8:31 AM EDT ----- Can you let her know that I communicated with Dr. Ladona Ridgel.  Agrees with my recommendation to continue to take her verapamil daily for HR/BP control.  We can use twice daily as needed for breakthrough tachycardias. Encourage support stockings to aid in control of her swelling with the verapamil, and elevate feet when seated.  Keep follow up with Dr. Ladona Ridgel as scheduled, call.reach out if needed.  Renee

## 2020-08-07 DIAGNOSIS — M1711 Unilateral primary osteoarthritis, right knee: Secondary | ICD-10-CM | POA: Diagnosis not present

## 2020-08-12 DIAGNOSIS — I10 Essential (primary) hypertension: Secondary | ICD-10-CM | POA: Diagnosis not present

## 2020-08-12 DIAGNOSIS — L259 Unspecified contact dermatitis, unspecified cause: Secondary | ICD-10-CM | POA: Diagnosis not present

## 2020-08-12 DIAGNOSIS — G2581 Restless legs syndrome: Secondary | ICD-10-CM | POA: Diagnosis not present

## 2020-08-12 DIAGNOSIS — R946 Abnormal results of thyroid function studies: Secondary | ICD-10-CM | POA: Diagnosis not present

## 2020-08-12 DIAGNOSIS — E038 Other specified hypothyroidism: Secondary | ICD-10-CM | POA: Diagnosis not present

## 2020-08-12 DIAGNOSIS — G629 Polyneuropathy, unspecified: Secondary | ICD-10-CM | POA: Diagnosis not present

## 2020-08-12 DIAGNOSIS — M170 Bilateral primary osteoarthritis of knee: Secondary | ICD-10-CM | POA: Diagnosis not present

## 2020-08-12 DIAGNOSIS — I5032 Chronic diastolic (congestive) heart failure: Secondary | ICD-10-CM | POA: Diagnosis not present

## 2020-08-13 ENCOUNTER — Telehealth: Payer: Self-pay | Admitting: *Deleted

## 2020-08-13 NOTE — Telephone Encounter (Signed)
   [facility] Pre-operative Risk Assessment    Patient Name: Natasha [name]  DOB: 03/01/1933  MRN: [id number]   HEARTCARE STAFF: - Please ensure there is not already an duplicate clearance open for this procedure. - Under Visit Info/Reason for Call, type in Other and utilize the format Clearance MM/DD/YY or Clearance TBD. Do not use dashes or single digits. - If request is for dental extraction, please clarify the # of teeth to be extracted.  Request for surgical clearance:  1. What type of surgery is being performed? RIGHT TOTAL KNEE ARTHROPLASTY   2. When is this surgery scheduled? TBD   3. What type of clearance is required (medical clearance vs. Pharmacy clearance to hold med vs. Both)? BOTH  4. Are there any medications that need to be held prior to surgery and how long? ELIQUIS   5. Practice name and name of physician performing surgery? [facility]; [place]   6. What is the office phone number? [phone]   7.   What is the office fax number? [phone] ATTN: ASHLEY HILTON  8.   Anesthesia type (None, local, MAC, general) ? SPINAL WITH ADDUCTOR BLOCK   Natasha [name] 08/13/2020, 5:17 PM  _________________________________________________________________   (provider comments below)

## 2020-08-14 NOTE — Telephone Encounter (Signed)
Patient with diagnosis of atrial fibrillation on Eliquis for anticoagulation.    Procedure: right total knee arthroplasty Date of procedure: TBD   CHA2DS2-VASc Score = 5  This indicates a 7.2% annual risk of stroke. The patient's score is based upon: CHF History: Yes HTN History: Yes Diabetes History: No Stroke History: No Vascular Disease History: No Age Score: 2 Gender Score: 1   CrCl 72.7 Platelet count 256  Per office protocol, patient can hold Eliquis for 3 days prior to procedure.    Patient will not need bridging with Lovenox (enoxaparin) around procedure.  For orthopedic procedures please be sure to resume therapeutic (not prophylactic) dosing.

## 2020-08-14 NOTE — Telephone Encounter (Signed)
   Primary Cardiologist: Armanda Magic, MD  Chart reviewed as part of pre-operative protocol coverage. Given past medical history and time since last visit, based on ACC/AHA guidelines, Natasha [name] would be at acceptable risk for the planned procedure without further cardiovascular testing.   She has a class II RCRI risk, 0.9% risk of major cardiac event.  Patient with diagnosis of atrial fibrillation on Eliquis for anticoagulation.    Procedure: right total knee arthroplasty Date of procedure: TBD   CHA2DS2-VASc Score = 5  This indicates a 7.2% annual risk of stroke. The patient's score is based upon: CHF History: Yes HTN History: Yes Diabetes History: No Stroke History: No Vascular Disease History: No Age Score: 2 Gender Score: 1   CrCl 72.7 Platelet count 256  Per office protocol, patient can hold Eliquis for 3 days prior to procedure.    Patient will not need bridging with Lovenox (enoxaparin) around procedure.  For orthopedic procedures please be sure to resume therapeutic (not prophylactic) dosing.  I will route this recommendation to the requesting party via Epic fax function and remove from pre-op pool.  Please call with questions.  Thomasene Ripple. Rodderick Holtzer NP-C    08/14/2020, 8:10 AM [facility] [address] Office [phone] Fax [phone]

## 2020-08-18 DIAGNOSIS — G629 Polyneuropathy, unspecified: Secondary | ICD-10-CM | POA: Diagnosis not present

## 2020-08-18 DIAGNOSIS — D6869 Other thrombophilia: Secondary | ICD-10-CM | POA: Diagnosis not present

## 2020-08-18 DIAGNOSIS — G2581 Restless legs syndrome: Secondary | ICD-10-CM | POA: Diagnosis not present

## 2020-08-18 DIAGNOSIS — M170 Bilateral primary osteoarthritis of knee: Secondary | ICD-10-CM | POA: Diagnosis not present

## 2020-08-18 DIAGNOSIS — I480 Paroxysmal atrial fibrillation: Secondary | ICD-10-CM | POA: Diagnosis not present

## 2020-08-18 DIAGNOSIS — I10 Essential (primary) hypertension: Secondary | ICD-10-CM | POA: Diagnosis not present

## 2020-08-18 DIAGNOSIS — E038 Other specified hypothyroidism: Secondary | ICD-10-CM | POA: Diagnosis not present

## 2020-08-18 DIAGNOSIS — Z0000 Encounter for general adult medical examination without abnormal findings: Secondary | ICD-10-CM | POA: Diagnosis not present

## 2020-08-18 DIAGNOSIS — I5032 Chronic diastolic (congestive) heart failure: Secondary | ICD-10-CM | POA: Diagnosis not present

## 2020-08-18 DIAGNOSIS — R946 Abnormal results of thyroid function studies: Secondary | ICD-10-CM | POA: Diagnosis not present

## 2020-09-01 DIAGNOSIS — I119 Hypertensive heart disease without heart failure: Secondary | ICD-10-CM | POA: Diagnosis not present

## 2020-09-01 DIAGNOSIS — G43009 Migraine without aura, not intractable, without status migrainosus: Secondary | ICD-10-CM | POA: Diagnosis not present

## 2020-09-01 DIAGNOSIS — J449 Chronic obstructive pulmonary disease, unspecified: Secondary | ICD-10-CM | POA: Diagnosis not present

## 2020-09-01 DIAGNOSIS — K219 Gastro-esophageal reflux disease without esophagitis: Secondary | ICD-10-CM | POA: Diagnosis not present

## 2020-09-01 DIAGNOSIS — I5032 Chronic diastolic (congestive) heart failure: Secondary | ICD-10-CM | POA: Diagnosis not present

## 2020-09-01 DIAGNOSIS — I480 Paroxysmal atrial fibrillation: Secondary | ICD-10-CM | POA: Diagnosis not present

## 2020-09-01 DIAGNOSIS — E038 Other specified hypothyroidism: Secondary | ICD-10-CM | POA: Diagnosis not present

## 2020-09-01 DIAGNOSIS — I10 Essential (primary) hypertension: Secondary | ICD-10-CM | POA: Diagnosis not present

## 2020-09-15 DIAGNOSIS — M25561 Pain in right knee: Secondary | ICD-10-CM | POA: Diagnosis not present

## 2020-09-15 DIAGNOSIS — M1711 Unilateral primary osteoarthritis, right knee: Secondary | ICD-10-CM | POA: Diagnosis not present

## 2020-09-15 DIAGNOSIS — R278 Other lack of coordination: Secondary | ICD-10-CM | POA: Diagnosis not present

## 2020-09-15 DIAGNOSIS — M25661 Stiffness of right knee, not elsewhere classified: Secondary | ICD-10-CM | POA: Diagnosis not present

## 2020-09-21 NOTE — Progress Notes (Deleted)
NEUROLOGY FOLLOW UP OFFICE NOTE  Natasha [name] [id number]  Assessment/Plan:   1.  Peripheral neuropathy 2.  Restless leg syndrome 3.  Lumbar spondylosis 4.  Osteoarthritis of both knees I believe that her problem is mainly pain rather than restless leg syndrome, pain related to neuropathy, lumbar radiculitis, degenerative disease of the lumbar spine and arthritis of her knees.  She reports sometimes involuntary jerking of the leg which may be related to pain.  She is currently no longer on medication that may cause myoclonus (off of gabapentin).  She has lumbar spondylosis so may consider cervical stenosis causing myelopathy.  She does not exhibit hyperreflexia, but that may be absent in setting of polyneuropathy and advanced age.  She does not exhibit any objective symptoms of myelopathy, such as significant extremity weakness or sensory deficits not suspicious for radiculopathy or polyneuropathy.  Due to her age and likelihood that she would not pursue any potential surgery, MRI of cervical spine deferred.  Will continue to monitor for changes.  1.  ***  Subjective:  Natasha [name] is an 87 year old white female with HTN, PAF, chronic diastolic heart failure, DJD, hypothyroidism, and restless leg syndrome whofollows up for neuropathy and restless leg syndrome.  She is accompanied by her husband who supplements history.  UPDATE: Current medications: Nortriptyline 10mg  QHS, ramipexole 0.25mg  2-3 hours prior to bedtime6-7 (second dose earlier in evening/late afternoon ineffective)., lorazepam 0.5-1mg  QHS PRN (to help with sleep)  For neuropathy, started nortriptyline in December.  Her legs always ache and feel heavy.  She has burning in her feet.  She has low back pain, hip pain and knee pain.  She says she can't walk.  Her right leg may jerk involuntarily at times.   HISTORY: She has had a very gradual onset of lower extremity weakness beginning around 2016 or 2017. She feels  wobbly on her feet. Over the past year, she no longer is able to walk up steps with her two feet. She needs to pull herself, either grabbing the bannister or bending over and climbing up the steps on all fours. Her legs ache. She was evaluated by pain management for low back and left hip pain. She has an MRI of the lumbar spine without contrast on 05/02/2018, which was personally reviewed, and showed multilevel lumbar spondylosis and degenerative endplate marrow edema at L1-L2 with left lateral disc bulge and spurring and mild left L1 foraminal stenosis as well as edema in the medial left psoas muscle at the L1-L2 level.She was told surgery wasn't an option.   She presented to the ED on 04/07/2019 following a fallin which shestruck the back of her head, sustaining a contusion. CT of head and cervical spine personally reviewed showed no evidence of trauma or other acute abnormality. No associated dizziness or passing out. She also reports numbness and tingling involving her fingers and toes. She feels shooting pain in the right ring finger and left middle finger. She reports spurs in her neck but denies any significant neck pain. No weakness in the upper extremities. She has restless leg syndrome which has gotten worseover the past several months. It feels like that her legs are jumping. She needs to keep moving her legs for relief. She is starting to notice it earlier in the evening while sitting and watching TV.She was started on pramipexole 0.125mg 2 to 3 hours beforebedtime which initially was helpful but then needed to increase to 0.25mg .Labs from February 2021 showedB12 346, folate 12.7, TSH  1.09, CK 54, aldolase 5.1, ferritin 65. SPEP/IFE from 08/01/2019 showed polyclonal increase in IgA but no M-spike. NCV-EMG of lower extremities on 07/24/2019 demonstrated a chronic sensorimotor axonal polyneuropathy.  Past medications:  Gabapentin (ineffective), Lyrica (rash)  PAST MEDICAL  HISTORY: Past Medical History:  Diagnosis Date  . Chronic diastolic heart failure (HCC) 04/22/2015  . Complication of anesthesia   . Diverticular disease   . DJD (degenerative joint disease)   . Dysphagia   . GERD (gastroesophageal reflux disease)   . Hiatal hernia   . Hypertension   . Hypothyroidism   . Lower leg edema   . PAF (paroxysmal atrial fibrillation) (HCC)    s/p ablation x several times now with occasional episodes of PAF.   She has failed sotolol/dofetilide/flecainide/propafenone/Amio and dronedarone either with not controlling her PAF or having side effects  . PONV (postoperative nausea and vomiting)   . Restless leg syndrome   . Seasonal allergies     MEDICATIONS: Current Outpatient Medications on File Prior to Visit  Medication Sig Dispense Refill  . butalbital-acetaminophen-caffeine (FIORICET) 50-325-40 MG tablet Take 1 tablet by mouth 2 (two) times daily as needed for headache.    Marland Kitchen ELIQUIS 5 MG TABS tablet TAKE 1 TABLET BY MOUTH 2 TIMES DAILY. 60 tablet 5  . esomeprazole (NEXIUM) 20 MG capsule Take 20 mg by mouth every 3 (three) days.     Marland Kitchen estradiol (CLIMARA - DOSED IN MG/24 HR) 0.025 mg/24hr patch Place 0.025 mg onto the skin every Sunday.    . furosemide (LASIX) 40 MG tablet Take 40 mg by mouth daily.    Marland Kitchen gabapentin (NEURONTIN) 600 MG tablet Take 600 mg by mouth 3 (three) times daily.    Marland Kitchen LORazepam (ATIVAN) 1 MG tablet Take 0.5-1 mg by mouth at bedtime as needed for sleep. For sleep    . Magnesium 500 MG CAPS Take 500 mg by mouth daily.    . Methylcobalamin (B-12) 1000 MCG TBDP Take 1,000 mcg by mouth daily.    . nortriptyline (PAMELOR) 10 MG capsule Take 1 capsule (10 mg total) by mouth at bedtime. 30 capsule 5  . potassium chloride (KLOR-CON) 10 MEQ tablet TAKE 1 TABLET BY MOUTH DAILY 90 tablet 3  . pramipexole (MIRAPEX) 0.125 MG tablet Take 0.25 mg by mouth daily at 6 PM.    . Riboflavin 400 MG CAPS Take 400 mg by mouth daily.    Marland Kitchen SYNTHROID 150 MCG tablet  Take 150 mcg by mouth daily before breakfast.    . verapamil (CALAN) 120 MG tablet Take 120 mg by mouth as needed. For palpitations or increased heart rate    . verapamil (CALAN-SR) 120 MG CR tablet Take one tablet by mouth daily.  You may take one additional dose daily as needed for increased palpitations. 180 tablet 3  . Vitamin D, Cholecalciferol, 50 MCG (2000 UT) CAPS Take 2,000 Units by mouth daily.     No current facility-administered medications on file prior to visit.    ALLERGIES: Allergies  Allergen Reactions  . Codeine Nausea And Vomiting and Nausea Only  . Penicillins Hives    Has patient had a PCN reaction causing immediate rash, facial/tongue/throat swelling, SOB or lightheadedness with hypotension: No Has patient had a PCN reaction causing severe rash involving mucus membranes or skin necrosis: No Has patient had a PCN reaction that required hospitalization No Has patient had a PCN reaction occurring within the last 10 years: No If all of the above answers are "NO",  then may proceed with Cephalosporin use.  . Tetracycline Rash  . Tramadol Nausea Only  . Digoxin Other (See Comments)    Flushed neck and face  . Digoxin And Related Other (See Comments)    ' Makes my face blood red"  . Erythromycin Other (See Comments) and Hives    Flushing on face  . Other Other (See Comments)    ECG leads cause skin irritation. ECG leads cause skin irritation.  . Sulfa Antibiotics Other (See Comments)    PRESERVATIVE OF SOME FOODS- MAKES FLUSHES TO FACE AND NECK  . Tetracyclines & Related Other (See Comments)    "Sores on legs"  . Diltiazem Hcl Rash    Rash with long acting form  . Lyrica [Pregabalin] Rash  . Sulfites Rash    PRESERVATIVE OF SOME FOODS- MAKES FLUSHES TO FACE AND NECK PRESERVATIVE OF SOME FOODS- MAKES FLUSHES TO FACE AND NECK    FAMILY HISTORY: Family History  Problem Relation Age of Onset  . Transient ischemic attack Mother   . Heart failure Father   . CVA  Father   . Lung cancer Father   . Hypertension Sister   . Cancer Brother   . Sudden death Brother   . Anesthesia problems Neg Hx   . Hypotension Neg Hx   . Malignant hyperthermia Neg Hx   . Pseudochol deficiency Neg Hx   . Heart attack Neg Hx       Objective:  *** General: No acute distress.  Patient appears ***-groomed.   Head:  Normocephalic/atraumatic Eyes:  Fundi examined but not visualized Neck: supple, no paraspinal tenderness, full range of motion Heart:  Regular rate and rhythm Lungs:  Clear to auscultation bilaterally Back: No paraspinal tenderness Neurological Exam: alert and oriented to person, place, and time. Attention span and concentration intact, recent and remote memory intact, fund of knowledge intact.  Speech fluent and not dysarthric, language intact.  CN II-XII intact. Bulk and tone normal, muscle strength 5/5 throughout.  Sensation to light touch, temperature and vibration intact.  Deep tendon reflexes 2+ throughout, toes downgoing.  Finger to nose and heel to shin testing intact.  Gait normal, Romberg negative.     Shon Millet, DO  CC: ***

## 2020-09-23 ENCOUNTER — Ambulatory Visit: Payer: Medicare Other | Admitting: Neurology

## 2020-09-29 ENCOUNTER — Other Ambulatory Visit: Payer: Self-pay | Admitting: Internal Medicine

## 2020-09-29 DIAGNOSIS — Z1231 Encounter for screening mammogram for malignant neoplasm of breast: Secondary | ICD-10-CM

## 2020-10-05 NOTE — Progress Notes (Signed)
NEUROLOGY FOLLOW UP OFFICE NOTE  Natasha [name] [id number]  Assessment/Plan:   Peripheral neuropathy Lumbar spondylosis Osteoarthritis in knees Restless leg syndrome While she has symptoms of restless leg syndrome, I believe that her problem is mainly pain rather than restless leg syndrome, pain related to neuropathy, lumbar radiculitis, degenerative disease of the lumbar spine and arthritis of her knees.   Due to rash, she wishes to discontinue gabapentin - will have her taper off.  Once she is off, she may contact me and we can try duloxetine for neuralgia. Otherwise, follow up 6 months.  Subjective:  Natasha [name] is an 87 year old white female with HTN, PAF, chronic diastolic heart failure, DJD, hypothyroidism, and restless leg syndrome who follows up for neuropathy and restless leg syndrome.  She is accompanied by her husband who supplements history.   UPDATE: Current medications:  Gabapentin 400mg  TID (restarted for knee pain but causing rash - supposed to have knee surgery), pramipexole 0.25mg  2-3 hours prior to bedtime 6-7 (second dose earlier in evening/late afternoon ineffective)., lorazepam 0.5-1mg  QHS PRN (to help with sleep)   Started nortriptyline 10mg  at bedtime.  Stopped after less than a month because it didn't help with her pain.  Pain persists.  She was restarted on gabapentin.  Doesn't think it is helping but also has started getting a rash.  She is having knee surgery early next month.   HISTORY:  She has had a very gradual onset of lower extremity weakness beginning around 2016 or 2017.  She feels wobbly on her feet.  Over the past year, she no longer is able to walk up steps with her two feet.  She needs to pull herself, either grabbing the bannister or bending over and climbing up the steps on all fours.  Her legs ache.  She was evaluated by pain management for low back and left hip pain.  She has an MRI of the lumbar spine without contrast on 05/02/2018, which was  personally reviewed, and showed multilevel lumbar spondylosis and degenerative endplate marrow edema at L1-L2 with left lateral disc bulge and spurring and mild left L1 foraminal stenosis as well as edema in the medial left psoas muscle at the L1-L2 level.  She was told surgery wasn't an option.       She presented to the ED on 04/07/2019 following a fall in which she struck the back of her head, sustaining a contusion.  CT of head and cervical spine personally reviewed showed no evidence of trauma or other acute abnormality.  No associated dizziness or passing out.  She also reports numbness and tingling involving her fingers and toes.  She feels shooting pain in the right ring finger and left middle finger.  She reports spurs in her neck but denies any significant neck pain.  No weakness in the upper extremities.  She has restless leg syndrome which has gotten worse over the past several months.  It feels like that her legs are jumping.  She needs to keep moving her legs for relief.  She is starting to notice it earlier in the evening while sitting and watching TV.  She was started on pramipexole 0.125mg  2 to 3 hours before bedtime which initially was helpful but then needed to increase to 0.25mg .  Labs from February 2021 showed B12 346, folate 12.7, TSH 1.09, CK 54, aldolase 5.1, ferritin 65.  SPEP/IFE from 08/01/2019 showed polyclonal increase in IgA but no M-spike.  NCV-EMG of lower extremities on  07/24/2019 demonstrated a chronic sensorimotor axonal polyneuropathy.   Past medications:  Gabapentin (ineffective), Lyrica (rash)  PAST MEDICAL HISTORY: Past Medical History:  Diagnosis Date   Chronic diastolic heart failure (HCC) 04/22/2015   Complication of anesthesia    Diverticular disease    DJD (degenerative joint disease)    Dysphagia    GERD (gastroesophageal reflux disease)    Hiatal hernia    Hypertension    Hypothyroidism    Lower leg edema    PAF (paroxysmal atrial fibrillation) (HCC)     s/p ablation x several times now with occasional episodes of PAF.   She has failed sotolol/dofetilide/flecainide/propafenone/Amio and dronedarone either with not controlling her PAF or having side effects   PONV (postoperative nausea and vomiting)    Restless leg syndrome    Seasonal allergies     MEDICATIONS: Current Outpatient Medications on File Prior to Visit  Medication Sig Dispense Refill   butalbital-acetaminophen-caffeine (FIORICET) 50-325-40 MG tablet Take 1 tablet by mouth 2 (two) times daily as needed for headache.     ELIQUIS 5 MG TABS tablet TAKE 1 TABLET BY MOUTH 2 TIMES DAILY. 60 tablet 5   esomeprazole (NEXIUM) 20 MG capsule Take 20 mg by mouth every 3 (three) days.      estradiol (CLIMARA - DOSED IN MG/24 HR) 0.025 mg/24hr patch Place 0.025 mg onto the skin every Sunday.     furosemide (LASIX) 40 MG tablet Take 40 mg by mouth daily.     gabapentin (NEURONTIN) 600 MG tablet Take 600 mg by mouth 3 (three) times daily.     LORazepam (ATIVAN) 1 MG tablet Take 0.5-1 mg by mouth at bedtime as needed for sleep. For sleep     Magnesium 500 MG CAPS Take 500 mg by mouth daily.     Methylcobalamin (B-12) 1000 MCG TBDP Take 1,000 mcg by mouth daily.     nortriptyline (PAMELOR) 10 MG capsule Take 1 capsule (10 mg total) by mouth at bedtime. 30 capsule 5   potassium chloride (KLOR-CON) 10 MEQ tablet TAKE 1 TABLET BY MOUTH DAILY 90 tablet 3   pramipexole (MIRAPEX) 0.125 MG tablet Take 0.25 mg by mouth daily at 6 PM.     Riboflavin 400 MG CAPS Take 400 mg by mouth daily.     SYNTHROID 150 MCG tablet Take 150 mcg by mouth daily before breakfast.     verapamil (CALAN) 120 MG tablet Take 120 mg by mouth as needed. For palpitations or increased heart rate     verapamil (CALAN-SR) 120 MG CR tablet Take one tablet by mouth daily.  You may take one additional dose daily as needed for increased palpitations. 180 tablet 3   Vitamin D, Cholecalciferol, 50 MCG (2000 UT) CAPS Take 2,000 Units by mouth  daily.     No current facility-administered medications on file prior to visit.    ALLERGIES: Allergies  Allergen Reactions   Codeine Nausea And Vomiting and Nausea Only   Penicillins Hives    Has patient had a PCN reaction causing immediate rash, facial/tongue/throat swelling, SOB or lightheadedness with hypotension: No Has patient had a PCN reaction causing severe rash involving mucus membranes or skin necrosis: No Has patient had a PCN reaction that required hospitalization No Has patient had a PCN reaction occurring within the last 10 years: No If all of the above answers are "NO", then may proceed with Cephalosporin use.   Tetracycline Rash   Tramadol Nausea Only   Digoxin Other (See Comments)  Flushed neck and face   Digoxin And Related Other (See Comments)    ' Makes my face blood red"   Erythromycin Other (See Comments) and Hives    Flushing on face   Other Other (See Comments)    ECG leads cause skin irritation. ECG leads cause skin irritation.   Sulfa Antibiotics Other (See Comments)    PRESERVATIVE OF SOME FOODS- MAKES FLUSHES TO FACE AND NECK   Tetracyclines & Related Other (See Comments)    "Sores on legs"   Diltiazem Hcl Rash    Rash with long acting form   Lyrica [Pregabalin] Rash   Sulfites Rash    PRESERVATIVE OF SOME FOODS- MAKES FLUSHES TO FACE AND NECK PRESERVATIVE OF SOME FOODS- MAKES FLUSHES TO FACE AND NECK    FAMILY HISTORY: Family History  Problem Relation Age of Onset   Transient ischemic attack Mother    Heart failure Father    CVA Father    Lung cancer Father    Hypertension Sister    Cancer Brother    Sudden death Brother    Anesthesia problems Neg Hx    Hypotension Neg Hx    Malignant hyperthermia Neg Hx    Pseudochol deficiency Neg Hx    Heart attack Neg Hx       Objective:  Blood pressure 136/72, pulse 76, height 5\' 8"  (1.727 m), weight 205 lb 12.8 oz (93.4 kg), SpO2 98 %. General: No acute distress.  Patient appears  well-groomed.     , DO  CC: Shon Millet, MD

## 2020-10-06 ENCOUNTER — Other Ambulatory Visit: Payer: Self-pay

## 2020-10-06 ENCOUNTER — Ambulatory Visit (INDEPENDENT_AMBULATORY_CARE_PROVIDER_SITE_OTHER): Payer: Medicare Other | Admitting: Neurology

## 2020-10-06 ENCOUNTER — Encounter: Payer: Self-pay | Admitting: Neurology

## 2020-10-06 VITALS — BP 136/72 | HR 76 | Ht 68.0 in | Wt 205.8 lb

## 2020-10-06 DIAGNOSIS — G2581 Restless legs syndrome: Secondary | ICD-10-CM | POA: Diagnosis not present

## 2020-10-06 DIAGNOSIS — G43009 Migraine without aura, not intractable, without status migrainosus: Secondary | ICD-10-CM | POA: Diagnosis not present

## 2020-10-06 DIAGNOSIS — M47816 Spondylosis without myelopathy or radiculopathy, lumbar region: Secondary | ICD-10-CM

## 2020-10-06 DIAGNOSIS — K219 Gastro-esophageal reflux disease without esophagitis: Secondary | ICD-10-CM | POA: Diagnosis not present

## 2020-10-06 DIAGNOSIS — E890 Postprocedural hypothyroidism: Secondary | ICD-10-CM | POA: Diagnosis not present

## 2020-10-06 DIAGNOSIS — M170 Bilateral primary osteoarthritis of knee: Secondary | ICD-10-CM

## 2020-10-06 DIAGNOSIS — I10 Essential (primary) hypertension: Secondary | ICD-10-CM | POA: Diagnosis not present

## 2020-10-06 DIAGNOSIS — E038 Other specified hypothyroidism: Secondary | ICD-10-CM | POA: Diagnosis not present

## 2020-10-06 DIAGNOSIS — I119 Hypertensive heart disease without heart failure: Secondary | ICD-10-CM | POA: Diagnosis not present

## 2020-10-06 DIAGNOSIS — G629 Polyneuropathy, unspecified: Secondary | ICD-10-CM | POA: Diagnosis not present

## 2020-10-06 DIAGNOSIS — I4891 Unspecified atrial fibrillation: Secondary | ICD-10-CM | POA: Diagnosis not present

## 2020-10-06 DIAGNOSIS — E039 Hypothyroidism, unspecified: Secondary | ICD-10-CM | POA: Diagnosis not present

## 2020-10-06 DIAGNOSIS — I5032 Chronic diastolic (congestive) heart failure: Secondary | ICD-10-CM | POA: Diagnosis not present

## 2020-10-06 DIAGNOSIS — I480 Paroxysmal atrial fibrillation: Secondary | ICD-10-CM | POA: Diagnosis not present

## 2020-10-06 DIAGNOSIS — J449 Chronic obstructive pulmonary disease, unspecified: Secondary | ICD-10-CM | POA: Diagnosis not present

## 2020-10-06 NOTE — Patient Instructions (Signed)
Try tapering off of gabapentin: - Take 1 pill twice daily for a week - Then 1 pill at bedtime for a week - Then STOP 2.  Once you are off, let me know and we can start duloxetine (Cymbalta) for nerve pain.

## 2020-10-15 ENCOUNTER — Encounter (HOSPITAL_COMMUNITY): Payer: Self-pay

## 2020-10-15 NOTE — Patient Instructions (Addendum)
DUE TO COVID-19 ONLY ONE VISITOR IS ALLOWED TO COME WITH YOU AND STAY IN THE WAITING ROOM ONLY DURING PRE OP AND PROCEDURE DAY OF SURGERY.   TWO VISITOR  MAY VISIT WITH YOU AFTER SURGERY IN YOUR PRIVATE ROOM DURING VISITING HOURS ONLY!  YOU NEED TO HAVE A COVID 19 TEST ON_  7-11-22______ @_______ , THIS TEST MUST BE DONE BEFORE SURGERY,    COVID TESTING SITE [address] [place] ,   IT IS ON THE RIGHT GOING OUT [address] APPROXIMATELY  2 MINUTES PAST [employer] ON THE RIGHT.   ONCE YOUR COVID TEST IS COMPLETED,  PLEASE Wear a mask when in public           Your procedure is scheduled on: 10-28-20   Report to [facility] Main  Entrance   Report to admitting at      0630 AM     Call this number if you have problems the morning of surgery [phone]    Remember: Do not eat food :After Midnight. You may have clear liquids until     0530 am then nothing by mouth      CLEAR LIQUID DIET   Foods Allowed                                                                     Foods Excluded water Black Coffee and tea, regular and decaf                             liquids that you cannot  Plain Jell-O any favor except red or purple                                           see through such as: Fruit ices (not with fruit pulp)                                                       milk, soups, orange juice  Iced Popsicles                                                        All solid food Carbonated beverages, regular and diet                                    Cranberry, grape and apple juices Sports drinks like Gatorade Lightly seasoned clear broth or consume(fat free) Sugar, honey syrup   _____________________________________________________________________     BRUSH YOUR TEETH MORNING OF SURGERY AND RINSE YOUR MOUTH OUT, NO CHEWING GUM CANDY OR MINTS.     Take these medicines the morning of surgery with A SIP OF WATER: verapamil  if  needed, synthyroid, gabapentin, nexium  DO NOT TAKE ANY DIABETIC MEDICATIONS DAY OF YOUR SURGERY                               You may not have any metal on your body including hair pins and              piercings  Do not wear jewelry, make-up, lotions, powders or perfumes, deodorant             Do not wear nail polish on your fingernails or toenails .  Do not shave  48 hours prior to surgery.              Do not bring valuables to the hospital. [facility] IS NOT             RESPONSIBLE   FOR VALUABLES.  Contacts, dentures or bridgework may not be worn into surgery.       Patients discharged the day of surgery will not be allowed to drive home. IF YOU ARE HAVING SURGERY AND GOING HOME THE SAME DAY, YOU MUST HAVE AN ADULT TO DRIVE YOU HOME AND BE WITH YOU FOR 24 HOURS. YOU MAY GO HOME BY TAXI OR UBER OR ORTHERWISE, BUT AN ADULT MUST ACCOMPANY YOU HOME AND STAY WITH YOU FOR 24 HOURS.  Name and phone number of your driver:  Special Instructions: N/A              Please read over the following fact sheets you were given: _____________________________________________________________________             [facility] - Preparing for Surgery Before surgery, you can play an important role.  Because skin is not sterile, your skin needs to be as free of germs as possible.  You can reduce the number of germs on your skin by washing with CHG (chlorahexidine gluconate) soap before surgery.  CHG is an antiseptic cleaner which kills germs and bonds with the skin to continue killing germs even after washing. Please DO NOT use if you have an allergy to CHG or antibacterial soaps.  If your skin becomes reddened/irritated stop using the CHG and inform your nurse when you arrive at Short Stay. Do not shave (including legs and underarms) for at least 48 hours prior to the first CHG shower.  You may shave your face/neck. Please follow these instructions carefully:  1.  Shower with CHG Soap the night before  surgery and the  morning of Surgery.  2.  If you choose to wash your hair, wash your hair first as usual with your  normal  shampoo.  3.  After you shampoo, rinse your hair and body thoroughly to remove the  shampoo.                           4.  Use CHG as you would any other liquid soap.  You can apply chg directly  to the skin and wash                       Gently with a scrungie or clean washcloth.  5.  Apply the CHG Soap to your body ONLY FROM THE NECK DOWN.   Do not use on face/ open  Wound or open sores. Avoid contact with eyes, ears mouth and genitals (private parts).                       Wash face,  Genitals (private parts) with your normal soap.             6.  Wash thoroughly, paying special attention to the area where your surgery  will be performed.  7.  Thoroughly rinse your body with warm water from the neck down.  8.  DO NOT shower/wash with your normal soap after using and rinsing off  the CHG Soap.                9.  Pat yourself dry with a clean towel.            10.  Wear clean pajamas.            11.  Place clean sheets on your bed the night of your first shower and do not  sleep with pets. Day of Surgery : Do not apply any lotions/deodorants the morning of surgery.  Please wear clean clothes to the hospital/surgery center.  FAILURE TO FOLLOW THESE INSTRUCTIONS MAY RESULT IN THE CANCELLATION OF YOUR SURGERY PATIENT SIGNATURE_________________________________  NURSE SIGNATURE__________________________________   Incentive Spirometer  An incentive spirometer is a tool that can help keep your lungs clear and active. This tool measures how well you are filling your lungs with each breath. Taking long deep breaths may help reverse or decrease the chance of developing breathing (pulmonary) problems (especially infection) following: A long period of time when you are unable to move or be active. BEFORE THE PROCEDURE  If the spirometer includes an  indicator to show your best effort, your nurse or respiratory therapist will set it to a desired goal. If possible, sit up straight or lean slightly forward. Try not to slouch. Hold the incentive spirometer in an upright position. INSTRUCTIONS FOR USE  Sit on the edge of your bed if possible, or sit up as far as you can in bed or on a chair. Hold the incentive spirometer in an upright position. Breathe out normally. Place the mouthpiece in your mouth and seal your lips tightly around it. Breathe in slowly and as deeply as possible, raising the piston or the ball toward the top of the column. Hold your breath for 3-5 seconds or for as long as possible. Allow the piston or ball to fall to the bottom of the column. Remove the mouthpiece from your mouth and breathe out normally. Rest for a few seconds and repeat Steps 1 through 7 at least 10 times every 1-2 hours when you are awake. Take your time and take a few normal breaths between deep breaths. The spirometer may include an indicator to show your best effort. Use the indicator as a goal to work toward during each repetition. After each set of 10 deep breaths, practice coughing to be sure your lungs are clear. If you have an incision (the cut made at the time of surgery), support your incision when coughing by placing a pillow or rolled up towels firmly against it. Once you are able to get out of bed, walk around indoors and cough well. You may stop using the incentive spirometer when instructed by your caregiver.  RISKS AND COMPLICATIONS Take your time so you do not get dizzy or light-headed. If you are in pain, you may need to take or ask for pain  medication before doing incentive spirometry. It is harder to take a deep breath if you are having pain. AFTER USE Rest and breathe slowly and easily. It can be helpful to keep track of a log of your progress. Your caregiver can provide you with a simple table to help with this. If you are using the  spirometer at home, follow these instructions: SEEK MEDICAL CARE IF:  You are having difficultly using the spirometer. You have trouble using the spirometer as often as instructed. Your pain medication is not giving enough relief while using the spirometer. You develop fever of 100.5 F (38.1 C) or higher. SEEK IMMEDIATE MEDICAL CARE IF:  You cough up bloody sputum that had not been present before. You develop fever of 102 F (38.9 C) or greater. You develop worsening pain at or near the incision site. MAKE SURE YOU:  Understand these instructions. Will watch your condition. Will get help right away if you are not doing well or get worse. Document Released: 08/15/2006 Document Revised: 06/27/2011 Document Reviewed: 10/16/2006 [facility] Patient Information 2014 [employer], [place].   ________________________________________________________________________  ________________________________________________________________________

## 2020-10-15 NOTE — H&P (View-Only) (Signed)
Please place orders in epic pt. Has preop 6-29 

## 2020-10-15 NOTE — Progress Notes (Signed)
Please place orders in epic pt. Has preop 6-29

## 2020-10-15 NOTE — Progress Notes (Addendum)
PCP -  Cardiologist - Armanda Magic, MD clearance 08-14-20 Scheryl Marten  PPM/ICD -  Device Orders -  Rep Notified -   Chest x-ray -  EKG - 06-04-20 Stress Test - 2018 ECHO - 2019 Cardiac Cath -   Sleep Study -  CPAP -   Fasting Blood Sugar -  Checks Blood Sugar _____ times a day  Blood Thinner Instructions:Elequis hold 3 days Aspirin Instructions:  ERAS Protcol - PRE-SURGERY Ensure or G2-   COVID TEST- 7-11  Activity-- Anesthesia review: HTN AFIB,CHF  Patient denies shortness of breath, fever, cough and chest pain at PAT appointment   All instructions explained to the patient, with a verbal understanding of the material. Patient agrees to go over the instructions while at home for a better understanding. Patient also instructed to self quarantine after being tested for COVID-19. The opportunity to ask questions was provided.

## 2020-10-16 ENCOUNTER — Other Ambulatory Visit: Payer: Self-pay

## 2020-10-16 ENCOUNTER — Encounter (HOSPITAL_COMMUNITY): Payer: Self-pay

## 2020-10-16 ENCOUNTER — Encounter (HOSPITAL_COMMUNITY)
Admission: RE | Admit: 2020-10-16 | Discharge: 2020-10-16 | Disposition: A | Discharge status: Home / Self Care | Payer: Medicare Other | Source: Ambulatory Visit | Attending: Specialist | Admitting: Specialist

## 2020-10-16 DIAGNOSIS — Z01812 Encounter for preprocedural laboratory examination: Secondary | ICD-10-CM | POA: Insufficient documentation

## 2020-10-16 HISTORY — DX: Chronic obstructive pulmonary disease, unspecified: J44.9

## 2020-10-16 HISTORY — DX: Myoneural disorder, unspecified: G70.9

## 2020-10-16 HISTORY — DX: Heart failure, unspecified: I50.9

## 2020-10-16 LAB — CBC
HCT: 40.5 % (ref 36.0–46.0)
Hemoglobin: 14.3 g/dL (ref 12.0–15.0)
MCH: 33.9 pg (ref 26.0–34.0)
MCHC: 35.3 g/dL (ref 30.0–36.0)
MCV: 96 fL (ref 80.0–100.0)
Platelets: 228 10*3/uL (ref 150–400)
RBC: 4.22 MIL/uL (ref 3.87–5.11)
RDW: 13.2 % (ref 11.5–15.5)
WBC: 4.9 10*3/uL (ref 4.0–10.5)
nRBC: 0 % (ref 0.0–0.2)

## 2020-10-16 LAB — BASIC METABOLIC PANEL
Anion gap: 4 — ABNORMAL LOW (ref 5–15)
BUN: 14 mg/dL (ref 8–23)
CO2: 26 mmol/L (ref 22–32)
Calcium: 9 mg/dL (ref 8.9–10.3)
Chloride: 103 mmol/L (ref 98–111)
Creatinine, Ser: 0.75 mg/dL (ref 0.44–1.00)
GFR, Estimated: >60 mL/min (ref >60)
Glucose, Bld: 98 mg/dL (ref 70–99)
Potassium: 4.5 mmol/L (ref 3.5–5.1)
Sodium: 133 mmol/L — ABNORMAL LOW (ref 135–145)

## 2020-10-16 LAB — SURGICAL PCR SCREEN
MRSA, PCR: NEGATIVE
Staphylococcus aureus: NEGATIVE

## 2020-10-21 NOTE — Anesthesia Preprocedure Evaluation (Addendum)
Anesthesia Evaluation  Patient identified by MRN, date of birth, ID band Patient awake    Reviewed: Allergy & Precautions, NPO status , Patient's Chart, lab work & pertinent test results  History of Anesthesia Complications (+) PONV and history of anesthetic complications  Airway Mallampati: II  TM Distance: >3 FB Neck ROM: Full    Dental no notable dental hx. (+) Implants, Dental Advisory Given, Teeth Intact   Pulmonary neg pulmonary ROS,    Pulmonary exam normal breath sounds clear to auscultation       Cardiovascular hypertension, Normal cardiovascular exam+ dysrhythmias Atrial Fibrillation  Rhythm:Regular Rate:Normal  12/2017 TTE  - Left ventricle: The cavity size was mildly reduced. There was  mild focal basal hypertrophy of the septum. Systolic function was  normal. The estimated ejection fraction was in the range of 55%  to 60%. Wall motion was normal; there were no regional wall  motion abnormalities. Doppler parameters are consistent with a  reversible restrictive pattern, indicative of decreased left  ventricular diastolic compliance and/or increased left atrial  pressure (grade 3 diastolic dysfunction).  - Aortic valve: There was no significant regurgitation.  - Mitral valve: There was mild regurgitation.  - Atrial septum: No defect or patent foramen ovale was identified.  - Tricuspid valve: There was moderate regurgitation.  - Pulmonic valve: There was mild regurgitation.  - Pulmonary arteries: Systolic pressure was within the normal  range.   Impressions:   - Normal LV EF with Doppler parameters consistent with restrictive  filling/grade 3 diastolic dysfunction.    Neuro/Psych negative neurological ROS     GI/Hepatic hiatal hernia, GERD  ,  Endo/Other  Hypothyroidism   Renal/GU Lab Results      Component                Value               Date                      CREATININE                0.75                10/16/2020                BUN                      14                  10/16/2020                NA                       133 (L)             10/16/2020                K                        4.5                 10/16/2020                CL                       103  10/16/2020                CO2                      26                  10/16/2020                Musculoskeletal  (+) Arthritis ,   Abdominal (+) + obese,   Peds  Hematology Lab Results      Component                Value               Date                      WBC                      4.9                 10/16/2020                HGB                      14.3                10/16/2020                HCT                      40.5                10/16/2020                MCV                      96.0                10/16/2020                PLT                      228                 10/16/2020              Anesthesia Other Findings All: See list  Reproductive/Obstetrics                           Anesthesia Physical Anesthesia Plan  ASA: 3  Anesthesia Plan: Spinal   Post-op Pain Management:  Regional for Post-op pain   Induction:   PONV Risk Score and Plan: 2 and Treatment may vary due to age or medical condition and Ondansetron  Airway Management Planned: Natural Airway  Additional Equipment: None  Intra-op Plan:   Post-operative Plan:   Informed Consent: I have reviewed the patients History and Physical, chart, labs and discussed the procedure including the risks, benefits and alternatives for the proposed anesthesia with the patient or authorized representative who has indicated his/her understanding and acceptance.     Dental advisory given  Plan Discussed with: CRNA and Anesthesiologist  Anesthesia Plan Comments: (See APP note by Joslyn Hy, FNP   R TKA w Adductor canal)      Anesthesia Quick Evaluation

## 2020-10-21 NOTE — Progress Notes (Signed)
Anesthesia Chart Review:   Case: [id number] Date/Time: 10/28/20 0815   Procedure: TOTAL KNEE ARTHROPLASTY (Right: Knee) - with adductor canal   Anesthesia type: Spinal   Pre-op diagnosis: Osteoarthritis righ tknee   [facility] ROOM 08 / [facility]   Surgeons: Eugenia Mcalpine, MD       DISCUSSION: Natasha [name] is an 87 year old with hx atrial fibrillation (s/p ablation x2), HTN, chronic diastolic HF, COPD  Natasha [name] to hold eliquis 3 days before surgery   VS: BP (!) 151/85   Pulse 75   Temp 36.5 C (Oral)   Resp 16   Ht 5\' 8"  (1.727 m)   Wt 91.2 kg   BMI 30.56 kg/m   PROVIDERS: - PCP is , MD - Cardiologist is Marden Noble, MD. Last office visit 11/14/19. Natasha [name] cleared for surgery at acceptable risk by 11/16/19, NP on 08/14/20 - EP cardiologist is 08/16/20, MD. Last office visit 06/04/20   LABS: Labs reviewed: Acceptable for surgery. (all labs ordered are listed, but only abnormal results are displayed)  Labs Reviewed  BASIC METABOLIC PANEL - Abnormal; Notable for the following components:      Result Value   Sodium 133 (*)    Anion gap 4 (*)    All other components within normal limits  SURGICAL PCR SCREEN  SURGICAL PCR SCREEN  CBC    EKG 06/04/20: NSR   CV: Echo 01/15/18:  - Left ventricle: The cavity size was mildly reduced. There was mild focal basal hypertrophy of the septum. Systolic function was normal. The estimated ejection fraction was in the range of 55% to 60%. Wall motion was normal; there were no regional wall motion abnormalities. Doppler parameters are consistent with a reversible restrictive pattern, indicative of decreased left ventricular diastolic compliance and/or increased left atrial pressure (grade 3 diastolic dysfunction).  - Aortic valve: There was no significant regurgitation.  - Mitral valve: There was mild regurgitation.  - Atrial septum: No defect or patent foramen ovale was identified.  - Tricuspid valve: There was moderate regurgitation.   - Pulmonic valve: There was mild regurgitation.  - Pulmonary arteries: Systolic pressure was within the normal range.  Impressions: Normal LV EF with Doppler parameters consistent with restrictive filling/grade 3 diastolic dysfunction.    Nuclear stress test 03/08/17:  Nuclear stress EF: 80%. There was no ST segment deviation noted during stress. The study is normal. This is a low risk study. The left ventricular ejection fraction is hyperdynamic (>65%).   Past Medical History:  Diagnosis Date   CHF (congestive heart failure) (HCC)    Natasha [name]. denies   Chronic diastolic heart failure (HCC) 04/22/2015   Complication of anesthesia    COPD (chronic obstructive pulmonary disease) (HCC)    Diverticular disease    DJD (degenerative joint disease)    Dysphagia    Dysrhythmia    afib   GERD (gastroesophageal reflux disease)    Hiatal hernia    Hypertension    Hypothyroidism    Lower leg edema    Neuromuscular disorder (HCC)    neuropathy   PAF (paroxysmal atrial fibrillation) (HCC)    s/p ablation x several times now with occasional episodes of PAF.   She has failed sotolol/dofetilide/flecainide/propafenone/Amio and dronedarone either with not controlling her PAF or having side effects   Pneumonia    PONV (postoperative nausea and vomiting)    Restless leg syndrome    Seasonal allergies     Past Surgical History:  Procedure Laterality Date  ABDOMINAL HYSTERECTOMY     APPENDECTOMY     ATRIAL FIBRILLATION ABLATION  09/06/2013   2'14 -[facility]   BALLOON DILATION  03/03/2011   Procedure: BALLOON DILATION;  Surgeon: Charolett Bumpers, MD;  Location: [facility];  Service: Endoscopy;  Laterality: N/A;   bladder polyps     BREAST EXCISIONAL BIOPSY Left    BREAST SURGERY     left lumpectomy   CARDIOVERSION     x3   CARDIOVERSION N/A 12/30/2014   Procedure: CARDIOVERSION;  Surgeon: Lewayne Bunting, MD;  Location: [facility] ENDOSCOPY;  Service: Cardiovascular;  Laterality: N/A;    CARDIOVERSION N/A 04/23/2015   Procedure: CARDIOVERSION;  Surgeon: Thurmon Fair, MD;  Location: [facility] ENDOSCOPY;  Service: Cardiovascular;  Laterality: N/A;   CATARACT EXTRACTION Left    COLONOSCOPY WITH PROPOFOL N/A 09/24/2013   Procedure: COLONOSCOPY WITH PROPOFOL;  Surgeon: Charolett Bumpers, MD;  Location: [facility];  Service: Endoscopy;  Laterality: N/A;   ESOPHAGOGASTRODUODENOSCOPY  03/03/2011   Procedure: ESOPHAGOGASTRODUODENOSCOPY (EGD);  Surgeon: Charolett Bumpers, MD;  Location: [facility] ENDOSCOPY;  Service: Endoscopy;  Laterality: N/A;   EYE SURGERY     cataracat   RHINOPLASTY     RIGHT HEART CATHETERIZATION N/A 07/11/2014   Procedure: RIGHT HEART CATH;  Surgeon: Laurey Morale, MD;  [facility] C[facility] LAB;  Service: Cardiovascular;  Laterality: N/A;   ROTATOR CUFF REPAIR     THYROIDECTOMY  1973   TONSILLECTOMY  1942   TUBAL LIGATION      MEDICATIONS:  acetaminophen (TYLENOL) 500 MG tablet   butalbital-acetaminophen-caffeine (FIORICET) 50-325-40 MG tablet   Dermatological Products, Misc. (KERASAL FUNGAL NAIL RENEWAL EX)   ELIQUIS 5 MG TABS tablet   esomeprazole (NEXIUM) 20 MG capsule   estradiol (CLIMARA - DOSED IN MG/24 HR) 0.025 mg/24hr patch   furosemide (LASIX) 40 MG tablet   gabapentin (NEURONTIN) 400 MG capsule   lidocaine (LMX) 4 % cream   LORazepam (ATIVAN) 1 MG tablet   MAGNESIUM PO   Methylcobalamin (B-12) 1000 MCG TBDP   mupirocin ointment (BACTROBAN) 2 %   nortriptyline (PAMELOR) 10 MG capsule   OVER THE COUNTER MEDICATION   potassium chloride (KLOR-CON) 10 MEQ tablet   pramipexole (MIRAPEX) 0.125 MG tablet   Riboflavin 400 MG CAPS   sennosides-docusate sodium (SENOKOT-S) 8.6-50 MG tablet   SYNTHROID 150 MCG tablet   triamcinolone (KENALOG) 0.025 % cream   valsartan (DIOVAN) 80 MG tablet   verapamil (CALAN-SR) 120 MG CR tablet   Vitamin D, Cholecalciferol, 50 MCG (2000 UT) CAPS   No current facility-administered medications for this encounter.   - Natasha [name] to  hold eliquis 3 days before surgery   If no changes, I anticipate Natasha [name] can proceed with surgery as scheduled.   Rica Mast, PhD, FNP-BC [facility]/Anesthesiology Phone: [phone] 10/21/2020 1:20 PM '

## 2020-10-22 NOTE — H&P (Signed)
TOTAL KNEE ADMISSION H&P  Patient is being admitted for right total knee arthroplasty.  Subjective:  Chief Complaint:right knee pain.  HPI: Natasha [name], 87 y.o. female, has a history of pain and functional disability in the right knee due to arthritis and has failed non-surgical conservative treatments for greater than 12 weeks to includeNSAID's and/or analgesics, corticosteriod injections, viscosupplementation injections, and activity modification.  Onset of symptoms was gradual, starting 5 years ago with gradually worsening course since that time. The patient noted no past surgery on the right knee(s).  Patient currently rates pain in the right knee(s) at 6 out of 10 with activity. Patient has night pain, worsening of pain with activity and weight bearing, pain with passive range of motion, crepitus, and joint swelling.  Patient has evidence of subchondral cysts, subchondral sclerosis, periarticular osteophytes, and joint space narrowing by imaging studies. This patient has had  No previous injury . There is no active infection.  Patient Active Problem List   Diagnosis Date Noted   Chronic fatigue 01/10/2017   Chronic diastolic heart failure (HCC) 04/22/2015   Nodule of right lung 09/07/2014   SOB (shortness of breath) 12/12/2013   Femoral bruit 08/27/2013   Claudication (HCC) 08/02/2013   GERD (gastroesophageal reflux disease)    Hypertension    Lower leg edema    Hypothyroidism    Restless leg syndrome    DJD (degenerative joint disease)    Diverticular disease    PONV (postoperative nausea and vomiting)    Hiatal hernia    History of anticoagulant therapy 05/18/2011   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   History of migraine headaches 05/18/2011   Persistent atrial fibrillation (HCC) 05/18/2011   Dysphagia 03/03/2011   Past Medical History:  Diagnosis Date   CHF (congestive heart failure) (HCC)    pt. denies   Chronic diastolic heart failure (HCC)  13/24/4010   Complication of anesthesia    COPD (chronic obstructive pulmonary disease) (HCC)    Diverticular disease    DJD (degenerative joint disease)    Dysphagia    Dysrhythmia    afib   GERD (gastroesophageal reflux disease)    Hiatal hernia    Hypertension    Hypothyroidism    Lower leg edema    Neuromuscular disorder (HCC)    neuropathy   PAF (paroxysmal atrial fibrillation) (HCC)    s/p ablation x several times now with occasional episodes of PAF.   She has failed sotolol/dofetilide/flecainide/propafenone/Amio and dronedarone either with not controlling her PAF or having side effects   Pneumonia    PONV (postoperative nausea and vomiting)    Restless leg syndrome    Seasonal allergies     Past Surgical History:  Procedure Laterality Date   ABDOMINAL HYSTERECTOMY     APPENDECTOMY     ATRIAL FIBRILLATION ABLATION  09/06/2013   2'14 -[facility]   BALLOON DILATION  03/03/2011   Procedure: BALLOON DILATION;  Surgeon: Charolett Bumpers, MD;  Location: [facility] ENDOSCOPY;  Service: Endoscopy;  Laterality: N/A;   bladder polyps     BREAST EXCISIONAL BIOPSY Left    BREAST SURGERY     left lumpectomy   CARDIOVERSION     x3   CARDIOVERSION N/A 12/30/2014   Procedure: CARDIOVERSION;  Surgeon: Lewayne Bunting, MD;  Location: [facility] ENDOSCOPY;  Service: Cardiovascular;  Laterality: N/A;   CARDIOVERSION N/A 04/23/2015   Procedure: CARDIOVERSION;  Surgeon: Thurmon Fair, MD;  Location: [facility] ENDOSCOPY;  Service: Cardiovascular;  Laterality: N/A;  CATARACT EXTRACTION Left    COLONOSCOPY WITH PROPOFOL N/A 09/24/2013   Procedure: COLONOSCOPY WITH PROPOFOL;  Surgeon: Charolett Bumpers, MD;  Location: [facility] ENDOSCOPY;  Service: Endoscopy;  Laterality: N/A;   ESOPHAGOGASTRODUODENOSCOPY  03/03/2011   Procedure: ESOPHAGOGASTRODUODENOSCOPY (EGD);  Surgeon: Charolett Bumpers, MD;  Location: [facility] ENDOSCOPY;  Service: Endoscopy;  Laterality: N/A;   EYE SURGERY     cataracat   RHINOPLASTY     RIGHT  HEART CATHETERIZATION N/A 07/11/2014   Procedure: RIGHT HEART CATH;  Surgeon: Laurey Morale, MD;  Location: [facility] C[facility] LAB;  Service: Cardiovascular;  Laterality: N/A;   ROTATOR CUFF REPAIR     THYROIDECTOMY  1973   TONSILLECTOMY  1942   TUBAL LIGATION      No current facility-administered medications for this encounter.   Current Outpatient Medications  Medication Sig Dispense Refill Last Dose   acetaminophen (TYLENOL) 500 MG tablet Take 1,000 mg by mouth every 8 (eight) hours as needed for moderate pain.      butalbital-acetaminophen-caffeine (FIORICET) 50-325-40 MG tablet Take 1 tablet by mouth 2 (two) times daily as needed for headache.      Dermatological Products, Misc. (KERASAL FUNGAL NAIL RENEWAL EX) Apply 1 application topically at bedtime.      ELIQUIS 5 MG TABS tablet TAKE 1 TABLET BY MOUTH 2 TIMES DAILY. (Patient taking differently: Take 5 mg by mouth 2 (two) times daily.) 60 tablet 5    esomeprazole (NEXIUM) 20 MG capsule Take 20 mg by mouth every 3 (three) days.       estradiol (CLIMARA - DOSED IN MG/24 HR) 0.025 mg/24hr patch Place 0.025 mg onto the skin every Sunday.      furosemide (LASIX) 40 MG tablet Take 40-60 mg by mouth daily.      gabapentin (NEURONTIN) 400 MG capsule Take 800 mg by mouth daily.      lidocaine (LMX) 4 % cream Apply 1 application topically 2 (two) times daily as needed (pain).      LORazepam (ATIVAN) 1 MG tablet Take 0.5-1 mg by mouth at bedtime as needed for sleep. For sleep      MAGNESIUM PO Take 400 mg by mouth daily.      Methylcobalamin (B-12) 1000 MCG TBDP Take 1,000 mcg by mouth daily.      mupirocin ointment (BACTROBAN) 2 % Apply 1 application topically 2 (two) times daily as needed (wound care).      OVER THE COUNTER MEDICATION Apply 1 application topically at bedtime as needed (pain). [employer] pain relief cream      potassium chloride (KLOR-CON) 10 MEQ tablet TAKE 1 TABLET BY MOUTH DAILY (Patient taking differently: Take 10 mEq by mouth  daily.) 90 tablet 3    pramipexole (MIRAPEX) 0.125 MG tablet Take 0.375 mg by mouth at bedtime.      Riboflavin 400 MG CAPS Take 400 mg by mouth daily.      sennosides-docusate sodium (SENOKOT-S) 8.6-50 MG tablet Take 1 tablet by mouth daily as needed for constipation.      SYNTHROID 150 MCG tablet Take 150 mcg by mouth daily before breakfast.      triamcinolone (KENALOG) 0.025 % cream Apply 1 application topically 2 (two) times daily.      valsartan (DIOVAN) 80 MG tablet Take 40 mg by mouth daily.      verapamil (CALAN-SR) 120 MG CR tablet Take 120 mg by mouth daily as needed (Tachycardia).      Vitamin D, Cholecalciferol, 50 MCG (2000 UT) CAPS  Take 2,000 Units by mouth daily.      nortriptyline (PAMELOR) 10 MG capsule Take 1 capsule (10 mg total) by mouth at bedtime. (Patient not taking: Reported on 10/09/2020) 30 capsule 5 Not Taking   Allergies  Allergen Reactions   Codeine Nausea And Vomiting and Nausea Only   Penicillins Hives    Has patient had a PCN reaction causing immediate rash, facial/tongue/throat swelling, SOB or lightheadedness with hypotension: No Has patient had a PCN reaction causing severe rash involving mucus membranes or skin necrosis: No Has patient had a PCN reaction that required hospitalization No Has patient had a PCN reaction occurring within the last 10 years: No If all of the above answers are "NO", then may proceed with Cephalosporin use.   Tetracycline Rash   Tramadol Nausea Only   Digoxin Other (See Comments)    Flushed neck and face   Digoxin And Related Other (See Comments)    ' Makes my face blood red"   Erythromycin Other (See Comments) and Hives    Flushing on face   Other Other (See Comments)    ECG leads cause skin irritation. ECG leads cause skin irritation.   Sulfa Antibiotics Other (See Comments)    PRESERVATIVE OF SOME FOODS- MAKES FLUSHES TO FACE AND NECK   Tetracyclines & Related Other (See Comments)    "Sores on legs"   Diltiazem Hcl  Rash    Rash with long acting form   Lyrica [Pregabalin] Rash   Sulfites Rash    PRESERVATIVE OF SOME FOODS- MAKES FLUSHES TO FACE AND NECK PRESERVATIVE OF SOME FOODS- MAKES FLUSHES TO FACE AND NECK    Social History   Tobacco Use   Smoking status: Never    Passive exposure: Past   Smokeless tobacco: Never  Substance Use Topics   Alcohol use: Not Currently    Comment: rare wine    Family History  Problem Relation Age of Onset   Transient ischemic attack Mother    Heart failure Father    CVA Father    Lung cancer Father    Hypertension Sister    Cancer Brother    Sudden death Brother    Anesthesia problems Neg Hx    Hypotension Neg Hx    Malignant hyperthermia Neg Hx    Pseudochol deficiency Neg Hx    Heart attack Neg Hx      Review of Systems  All other systems reviewed and are negative.  Objective:  Physical Exam Vitals reviewed.  Constitutional:      Appearance: Normal appearance. She is normal weight.  HENT:     Head: Normocephalic and atraumatic.  Eyes:     Extraocular Movements: Extraocular movements intact.  Cardiovascular:     Rate and Rhythm: Normal rate and regular rhythm.     Heart sounds: No murmur heard.   No friction rub. No gallop.  Pulmonary:     Effort: Pulmonary effort is normal. No respiratory distress.     Breath sounds: Normal breath sounds. No stridor. No wheezing, rhonchi or rales.  Chest:     Chest wall: No tenderness.  Musculoskeletal:     Comments: On exam of their right knee, no skin changes or effusions noted. Patient has tenderness with palpation over the medial and lateral joint spaces. Full extension, full flexion. Crepitus under the patella with flexion and extension.No laxity of varus valgus pressure. 5 out of 5 strength with resisted knee flexion and extension. Calf is supple. Neurovascularly intact in their  right lower extremity.  Skin:    Capillary Refill: Capillary refill takes less than 2 seconds.  Neurological:      General: No focal deficit present.     Mental Status: She is alert and oriented to person, place, and time.  Psychiatric:        Mood and Affect: Mood normal.        Behavior: Behavior normal.        Thought Content: Thought content normal.        Judgment: Judgment normal.    Vital signs in last 24 hours:    Labs:   Estimated body mass index is 30.56 kg/m as calculated from the following:   Height as of 10/16/20: 5\' 8"  (1.727 m).   Weight as of 10/16/20: 91.2 kg.   Imaging Review Plain radiographs demonstrate severe degenerative joint disease of the right knee(s). The overall alignment ismild varus. The bone quality appears to be good for age and reported activity level.      Assessment/Plan:  End stage arthritis, right knee   The patient history, physical examination, clinical judgment of the provider and imaging studies are consistent with end stage degenerative joint disease of the right knee(s) and total knee arthroplasty is deemed medically necessary. The treatment options including medical management, injection therapy arthroscopy and arthroplasty were discussed at length. The risks and benefits of total knee arthroplasty were presented and reviewed. The risks due to aseptic loosening, infection, stiffness, patella tracking problems, thromboembolic complications and other imponderables were discussed. The patient acknowledged the explanation, agreed to proceed with the plan and consent was signed. Patient is being admitted for inpatient treatment for surgery, pain control, PT, OT, prophylactic antibiotics, VTE prophylaxis, progressive ambulation and ADL's and discharge planning. The patient is planning to be discharged home with home health services     Patient's anticipated LOS is less than 2 midnights, meeting these requirements: - Younger than 51 - Lives within 1 hour of care - Has a competent adult at home to recover with post-op recover - NO history of  - Chronic  pain requiring opiods  - Diabetes  - Coronary Artery Disease  - Heart failure  - Heart attack  - Stroke  - DVT/VTE  - Cardiac arrhythmia  - Respiratory Failure/COPD  - Renal failure  - Anemia  - Advanced Liver disease

## 2020-10-26 ENCOUNTER — Other Ambulatory Visit (HOSPITAL_COMMUNITY)
Admission: RE | Admit: 2020-10-26 | Discharge: 2020-10-26 | Disposition: A | Discharge status: Home / Self Care | Payer: Medicare Other | Source: Ambulatory Visit | Attending: Specialist | Admitting: Specialist

## 2020-10-26 DIAGNOSIS — Z01812 Encounter for preprocedural laboratory examination: Secondary | ICD-10-CM | POA: Diagnosis not present

## 2020-10-26 DIAGNOSIS — Z20822 Contact with and (suspected) exposure to covid-19: Secondary | ICD-10-CM | POA: Insufficient documentation

## 2020-10-26 LAB — SARS CORONAVIRUS 2 (TAT 6-24 HRS): SARS Coronavirus 2: NEGATIVE

## 2020-10-27 MED ORDER — BUPIVACAINE LIPOSOME 1.3 % IJ SUSP
20.0000 mL | Freq: Once | INTRAMUSCULAR | Status: AC
  Filled 2020-10-27: qty 20

## 2020-10-28 ENCOUNTER — Other Ambulatory Visit: Payer: Self-pay

## 2020-10-28 ENCOUNTER — Encounter (HOSPITAL_COMMUNITY): Payer: Self-pay | Admitting: Specialist

## 2020-10-28 ENCOUNTER — Ambulatory Visit (HOSPITAL_COMMUNITY): Payer: Medicare Other | Admitting: Emergency Medicine

## 2020-10-28 ENCOUNTER — Encounter (HOSPITAL_COMMUNITY)
Admission: RE | Disposition: A | Discharge status: Home / Self Care | Payer: Self-pay | Source: Home / Self Care | Attending: Specialist

## 2020-10-28 ENCOUNTER — Ambulatory Visit (HOSPITAL_COMMUNITY): Payer: Medicare Other | Admitting: Certified Registered Nurse Anesthetist

## 2020-10-28 ENCOUNTER — Observation Stay (HOSPITAL_COMMUNITY)
Admission: RE | Admit: 2020-10-28 | Discharge: 2020-10-30 | Disposition: A | Discharge status: Home / Self Care | Payer: Medicare Other | Attending: Specialist | Admitting: Specialist

## 2020-10-28 DIAGNOSIS — I4819 Other persistent atrial fibrillation: Secondary | ICD-10-CM | POA: Diagnosis not present

## 2020-10-28 DIAGNOSIS — Z79899 Other long term (current) drug therapy: Secondary | ICD-10-CM | POA: Insufficient documentation

## 2020-10-28 DIAGNOSIS — G8918 Other acute postprocedural pain: Secondary | ICD-10-CM | POA: Diagnosis not present

## 2020-10-28 DIAGNOSIS — Z7901 Long term (current) use of anticoagulants: Secondary | ICD-10-CM | POA: Insufficient documentation

## 2020-10-28 DIAGNOSIS — I5032 Chronic diastolic (congestive) heart failure: Secondary | ICD-10-CM | POA: Insufficient documentation

## 2020-10-28 DIAGNOSIS — I110 Hypertensive heart disease with heart failure: Secondary | ICD-10-CM | POA: Diagnosis not present

## 2020-10-28 DIAGNOSIS — J449 Chronic obstructive pulmonary disease, unspecified: Secondary | ICD-10-CM | POA: Diagnosis not present

## 2020-10-28 DIAGNOSIS — M1711 Unilateral primary osteoarthritis, right knee: Secondary | ICD-10-CM | POA: Diagnosis not present

## 2020-10-28 DIAGNOSIS — K219 Gastro-esophageal reflux disease without esophagitis: Secondary | ICD-10-CM | POA: Diagnosis not present

## 2020-10-28 DIAGNOSIS — E039 Hypothyroidism, unspecified: Secondary | ICD-10-CM | POA: Insufficient documentation

## 2020-10-28 HISTORY — PX: TOTAL KNEE ARTHROPLASTY: SHX125

## 2020-10-28 LAB — COMPREHENSIVE METABOLIC PANEL
ALT: 15 U/L (ref 0–44)
AST: 19 U/L (ref 15–41)
Albumin: 4.4 g/dL (ref 3.5–5.0)
Alkaline Phosphatase: 70 U/L (ref 38–126)
Anion gap: 7 (ref 5–15)
BUN: 13 mg/dL (ref 8–23)
CO2: 23 mmol/L (ref 22–32)
Calcium: 9 mg/dL (ref 8.9–10.3)
Chloride: 102 mmol/L (ref 98–111)
Creatinine, Ser: 0.69 mg/dL (ref 0.44–1.00)
GFR, Estimated: >60 mL/min (ref >60)
Glucose, Bld: 92 mg/dL (ref 70–99)
Potassium: 4 mmol/L (ref 3.5–5.1)
Sodium: 132 mmol/L — ABNORMAL LOW (ref 135–145)
Total Bilirubin: 0.7 mg/dL (ref 0.3–1.2)
Total Protein: 7.6 g/dL (ref 6.5–8.1)

## 2020-10-28 LAB — TYPE AND SCREEN
ABO/RH(D): A NEG
Antibody Screen: NEGATIVE

## 2020-10-28 LAB — ABO/RH: ABO/RH(D): A NEG

## 2020-10-28 SURGERY — ARTHROPLASTY, KNEE, TOTAL
Anesthesia: Spinal | Site: Knee | Laterality: Right

## 2020-10-28 MED ORDER — ACETAMINOPHEN 10 MG/ML IV SOLN
INTRAVENOUS | Status: AC
  Administered 2020-10-28: 1000 mg via INTRAVENOUS
  Filled 2020-10-28: qty 100

## 2020-10-28 MED ORDER — FENTANYL CITRATE (PF) 100 MCG/2ML IJ SOLN
INTRAMUSCULAR | Status: AC
  Administered 2020-10-28: 50 ug via INTRAVENOUS
  Filled 2020-10-28: qty 2

## 2020-10-28 MED ORDER — PROPOFOL 10 MG/ML IV BOLUS
INTRAVENOUS | Status: AC
  Filled 2020-10-28: qty 20

## 2020-10-28 MED ORDER — CHLORHEXIDINE GLUCONATE 0.12 % MT SOLN
15.0000 mL | Freq: Once | OROMUCOSAL | Status: AC
  Administered 2020-10-28: 15 mL via OROMUCOSAL

## 2020-10-28 MED ORDER — DIPHENHYDRAMINE HCL 12.5 MG/5ML PO ELIX: 12.5000 mg | ORAL_SOLUTION | ORAL | Status: DC | PRN

## 2020-10-28 MED ORDER — SODIUM CHLORIDE 0.9 % IR SOLN
Status: DC | PRN
  Administered 2020-10-28: 1000 mL

## 2020-10-28 MED ORDER — FENTANYL CITRATE (PF) 100 MCG/2ML IJ SOLN
INTRAMUSCULAR | Status: DC | PRN
  Administered 2020-10-28 (×4): 25 ug via INTRAVENOUS

## 2020-10-28 MED ORDER — FENTANYL CITRATE (PF) 100 MCG/2ML IJ SOLN
INTRAMUSCULAR | Status: AC
  Filled 2020-10-28: qty 2

## 2020-10-28 MED ORDER — NORTRIPTYLINE HCL 10 MG PO CAPS
10.0000 mg | ORAL_CAPSULE | Freq: Every day | ORAL | Status: DC
  Administered 2020-10-28 – 2020-10-29 (×2): 10 mg via ORAL
  Filled 2020-10-28 (×2): qty 1

## 2020-10-28 MED ORDER — GABAPENTIN 400 MG PO CAPS
800.0000 mg | ORAL_CAPSULE | Freq: Every day | ORAL | Status: DC
  Administered 2020-10-30: 800 mg via ORAL
  Filled 2020-10-28 (×2): qty 2

## 2020-10-28 MED ORDER — SENNOSIDES-DOCUSATE SODIUM 8.6-50 MG PO TABS: 1.0000 | ORAL_TABLET | Freq: Every evening | ORAL | Status: DC | PRN

## 2020-10-28 MED ORDER — METHOCARBAMOL 500 MG IVPB - SIMPLE MED
INTRAVENOUS | Status: AC
  Filled 2020-10-28: qty 50

## 2020-10-28 MED ORDER — AMISULPRIDE (ANTIEMETIC) 5 MG/2ML IV SOLN
10.0000 mg | Freq: Once | INTRAVENOUS | Status: AC | PRN
  Administered 2020-10-28: 10 mg via INTRAVENOUS

## 2020-10-28 MED ORDER — PROPOFOL 10 MG/ML IV BOLUS
INTRAVENOUS | Status: DC | PRN
  Administered 2020-10-28: 20 mg via INTRAVENOUS
  Administered 2020-10-28: 30 mg via INTRAVENOUS

## 2020-10-28 MED ORDER — DEXAMETHASONE SODIUM PHOSPHATE 10 MG/ML IJ SOLN
INTRAMUSCULAR | Status: AC
  Filled 2020-10-28: qty 1

## 2020-10-28 MED ORDER — PROPOFOL 500 MG/50ML IV EMUL
INTRAVENOUS | Status: DC | PRN
  Administered 2020-10-28: 50 ug/kg/min via INTRAVENOUS

## 2020-10-28 MED ORDER — PROCHLORPERAZINE EDISYLATE 10 MG/2ML IJ SOLN
10.0000 mg | Freq: Four times a day (QID) | INTRAMUSCULAR | Status: DC | PRN
  Administered 2020-10-28 – 2020-10-29 (×2): 10 mg via INTRAVENOUS
  Filled 2020-10-28 (×2): qty 2

## 2020-10-28 MED ORDER — SODIUM CHLORIDE 0.9 % IV SOLN: INTRAVENOUS | Status: DC

## 2020-10-28 MED ORDER — ONDANSETRON HCL 4 MG PO TABS
4.0000 mg | ORAL_TABLET | Freq: Four times a day (QID) | ORAL | Status: DC | PRN
  Administered 2020-10-29 (×2): 4 mg via ORAL
  Filled 2020-10-28 (×2): qty 1

## 2020-10-28 MED ORDER — PRAMIPEXOLE DIHYDROCHLORIDE 0.25 MG PO TABS
0.3750 mg | ORAL_TABLET | Freq: Every day | ORAL | Status: DC
  Administered 2020-10-28 – 2020-10-29 (×2): 0.375 mg via ORAL
  Filled 2020-10-28 (×2): qty 1

## 2020-10-28 MED ORDER — FUROSEMIDE 40 MG PO TABS
40.0000 mg | ORAL_TABLET | Freq: Every day | ORAL | Status: DC
  Administered 2020-10-28 – 2020-10-30 (×3): 40 mg via ORAL
  Filled 2020-10-28 (×3): qty 1

## 2020-10-28 MED ORDER — APIXABAN 5 MG PO TABS
5.0000 mg | ORAL_TABLET | Freq: Two times a day (BID) | ORAL | Status: DC
  Administered 2020-10-29 – 2020-10-30 (×3): 5 mg via ORAL
  Filled 2020-10-28 (×3): qty 1

## 2020-10-28 MED ORDER — LACTATED RINGERS IV SOLN: INTRAVENOUS | Status: DC

## 2020-10-28 MED ORDER — SODIUM CHLORIDE (PF) 0.9 % IJ SOLN
INTRAMUSCULAR | Status: DC | PRN
  Administered 2020-10-28: 60 mL

## 2020-10-28 MED ORDER — HYDROCODONE-ACETAMINOPHEN 5-325 MG PO TABS
1.0000 | ORAL_TABLET | ORAL | Status: DC | PRN
  Administered 2020-10-29: 2 via ORAL
  Filled 2020-10-28: qty 2

## 2020-10-28 MED ORDER — ORAL CARE MOUTH RINSE: 15.0000 mL | Freq: Once | OROMUCOSAL | Status: AC

## 2020-10-28 MED ORDER — PROCHLORPERAZINE MALEATE 10 MG PO TABS
10.0000 mg | ORAL_TABLET | Freq: Four times a day (QID) | ORAL | Status: DC | PRN
  Filled 2020-10-28 (×2): qty 1

## 2020-10-28 MED ORDER — TRANEXAMIC ACID-NACL 1000-0.7 MG/100ML-% IV SOLN
1000.0000 mg | INTRAVENOUS | Status: AC
Start: 2020-10-28 — End: 2020-10-28
  Administered 2020-10-28: 1000 mg via INTRAVENOUS
  Filled 2020-10-28 (×2): qty 100

## 2020-10-28 MED ORDER — FUROSEMIDE 40 MG PO TABS: 40.0000 mg | ORAL_TABLET | Freq: Every day | ORAL | Status: DC

## 2020-10-28 MED ORDER — BUPIVACAINE LIPOSOME 1.3 % IJ SUSP
INTRAMUSCULAR | Status: DC | PRN
  Administered 2020-10-28: 20 mL

## 2020-10-28 MED ORDER — LEVOTHYROXINE SODIUM 150 MCG PO TABS
150.0000 ug | ORAL_TABLET | Freq: Every day | ORAL | Status: DC
  Administered 2020-10-29 – 2020-10-30 (×2): 150 ug via ORAL
  Filled 2020-10-28: qty 1
  Filled 2020-10-28: qty 2
  Filled 2020-10-28: qty 1

## 2020-10-28 MED ORDER — METHOCARBAMOL 500 MG IVPB - SIMPLE MED
500.0000 mg | Freq: Four times a day (QID) | INTRAVENOUS | Status: DC | PRN
  Administered 2020-10-28: 500 mg via INTRAVENOUS
  Filled 2020-10-28: qty 50

## 2020-10-28 MED ORDER — PANTOPRAZOLE SODIUM 40 MG PO TBEC
40.0000 mg | DELAYED_RELEASE_TABLET | Freq: Every day | ORAL | Status: DC
  Administered 2020-10-30: 40 mg via ORAL
  Filled 2020-10-28 (×2): qty 1

## 2020-10-28 MED ORDER — ONDANSETRON HCL 4 MG/2ML IJ SOLN
INTRAMUSCULAR | Status: AC
  Filled 2020-10-28: qty 2

## 2020-10-28 MED ORDER — VERAPAMIL HCL ER 120 MG PO TBCR
120.0000 mg | EXTENDED_RELEASE_TABLET | Freq: Every day | ORAL | Status: DC | PRN
  Filled 2020-10-28: qty 1

## 2020-10-28 MED ORDER — BUPIVACAINE IN DEXTROSE 0.75-8.25 % IT SOLN
INTRATHECAL | Status: DC | PRN
  Administered 2020-10-28: 1.6 mL via INTRATHECAL

## 2020-10-28 MED ORDER — ONDANSETRON HCL 4 MG/2ML IJ SOLN
INTRAMUSCULAR | Status: AC
  Administered 2020-10-28: 4 mg via INTRAVENOUS
  Filled 2020-10-28: qty 2

## 2020-10-28 MED ORDER — DEXAMETHASONE SODIUM PHOSPHATE 10 MG/ML IJ SOLN: 8.0000 mg | Freq: Once | INTRAMUSCULAR | Status: DC

## 2020-10-28 MED ORDER — 0.9 % SODIUM CHLORIDE (POUR BTL) OPTIME
TOPICAL | Status: DC | PRN
  Administered 2020-10-28: 1000 mL

## 2020-10-28 MED ORDER — PHENYLEPHRINE 40 MCG/ML (10ML) SYRINGE FOR IV PUSH (FOR BLOOD PRESSURE SUPPORT)
PREFILLED_SYRINGE | INTRAVENOUS | Status: DC | PRN
  Administered 2020-10-28: 80 ug via INTRAVENOUS
  Administered 2020-10-28: 40 ug via INTRAVENOUS
  Administered 2020-10-28 (×2): 80 ug via INTRAVENOUS

## 2020-10-28 MED ORDER — ONDANSETRON HCL 4 MG/2ML IJ SOLN: 4.0000 mg | Freq: Once | INTRAMUSCULAR | Status: AC | PRN

## 2020-10-28 MED ORDER — BISACODYL 5 MG PO TBEC: 5.0000 mg | DELAYED_RELEASE_TABLET | Freq: Every day | ORAL | Status: DC | PRN

## 2020-10-28 MED ORDER — DIPHENHYDRAMINE HCL 50 MG/ML IJ SOLN
6.2500 mg | INTRAMUSCULAR | Status: AC
  Administered 2020-10-28: 6.25 mg via INTRAVENOUS

## 2020-10-28 MED ORDER — AMISULPRIDE (ANTIEMETIC) 5 MG/2ML IV SOLN
INTRAVENOUS | Status: AC
  Filled 2020-10-28: qty 2

## 2020-10-28 MED ORDER — POVIDONE-IODINE 10 % EX SWAB
2.0000 | Freq: Once | CUTANEOUS | Status: AC
Start: 2020-10-28 — End: 2020-10-28
  Administered 2020-10-28: 2 via TOPICAL

## 2020-10-28 MED ORDER — HYDROCODONE-ACETAMINOPHEN 7.5-325 MG PO TABS
1.0000 | ORAL_TABLET | ORAL | Status: DC | PRN
  Administered 2020-10-28 – 2020-10-29 (×4): 2 via ORAL
  Administered 2020-10-29 – 2020-10-30 (×3): 1 via ORAL
  Filled 2020-10-28 (×2): qty 2
  Filled 2020-10-28 (×3): qty 1
  Filled 2020-10-28: qty 2
  Filled 2020-10-28: qty 1
  Filled 2020-10-28: qty 2

## 2020-10-28 MED ORDER — ONDANSETRON HCL 4 MG/2ML IJ SOLN
INTRAMUSCULAR | Status: DC | PRN
  Administered 2020-10-28: 4 mg via INTRAVENOUS

## 2020-10-28 MED ORDER — LORAZEPAM 0.5 MG PO TABS
0.5000 mg | ORAL_TABLET | Freq: Every evening | ORAL | Status: DC | PRN
  Administered 2020-10-29: 0.5 mg via ORAL
  Filled 2020-10-28: qty 2

## 2020-10-28 MED ORDER — ROPIVACAINE HCL 7.5 MG/ML IJ SOLN
INTRAMUSCULAR | Status: DC | PRN
  Administered 2020-10-28: 20 mL via PERINEURAL

## 2020-10-28 MED ORDER — ACETAMINOPHEN 500 MG PO TABS
500.0000 mg | ORAL_TABLET | Freq: Four times a day (QID) | ORAL | Status: AC
  Administered 2020-10-28 – 2020-10-29 (×2): 500 mg via ORAL
  Filled 2020-10-28 (×3): qty 1

## 2020-10-28 MED ORDER — MORPHINE SULFATE (PF) 2 MG/ML IV SOLN
0.5000 mg | INTRAVENOUS | Status: DC | PRN
  Administered 2020-10-29: 1 mg via INTRAVENOUS
  Filled 2020-10-28: qty 1

## 2020-10-28 MED ORDER — DIPHENHYDRAMINE HCL 50 MG/ML IJ SOLN
INTRAMUSCULAR | Status: AC
  Filled 2020-10-28: qty 1

## 2020-10-28 MED ORDER — PHENYLEPHRINE HCL-NACL 10-0.9 MG/250ML-% IV SOLN
INTRAVENOUS | Status: DC | PRN
  Administered 2020-10-28: 25 ug/min via INTRAVENOUS

## 2020-10-28 MED ORDER — MAGNESIUM CITRATE PO SOLN: 1.0000 | Freq: Once | ORAL | Status: DC | PRN

## 2020-10-28 MED ORDER — OXYCODONE HCL 5 MG PO TABS
ORAL_TABLET | ORAL | Status: AC
  Administered 2020-10-28: 5 mg via ORAL
  Filled 2020-10-28: qty 1

## 2020-10-28 MED ORDER — PROPOFOL 1000 MG/100ML IV EMUL
INTRAVENOUS | Status: AC
  Filled 2020-10-28: qty 100

## 2020-10-28 MED ORDER — VANCOMYCIN HCL 2000 MG/400ML IV SOLN
2000.0000 mg | Freq: Once | INTRAVENOUS | Status: AC
  Administered 2020-10-28: 2000 mg via INTRAVENOUS
  Filled 2020-10-28: qty 400

## 2020-10-28 MED ORDER — ONDANSETRON HCL 4 MG/2ML IJ SOLN
4.0000 mg | Freq: Four times a day (QID) | INTRAMUSCULAR | Status: DC | PRN
  Administered 2020-10-28 – 2020-10-29 (×2): 4 mg via INTRAVENOUS
  Filled 2020-10-28 (×2): qty 2

## 2020-10-28 MED ORDER — ACETAMINOPHEN 10 MG/ML IV SOLN: 1000.0000 mg | Freq: Once | INTRAVENOUS | Status: DC | PRN

## 2020-10-28 MED ORDER — FENTANYL CITRATE (PF) 100 MCG/2ML IJ SOLN
25.0000 ug | INTRAMUSCULAR | Status: DC | PRN
  Administered 2020-10-28: 50 ug via INTRAVENOUS

## 2020-10-28 MED ORDER — CLONIDINE HCL (ANALGESIA) 100 MCG/ML EP SOLN
EPIDURAL | Status: DC | PRN
  Administered 2020-10-28: 100 ug

## 2020-10-28 MED ORDER — ACETAMINOPHEN 325 MG PO TABS: 325.0000 mg | ORAL_TABLET | Freq: Four times a day (QID) | ORAL | Status: DC | PRN

## 2020-10-28 MED ORDER — PHENYLEPHRINE HCL-NACL 10-0.9 MG/250ML-% IV SOLN
INTRAVENOUS | Status: AC
  Filled 2020-10-28: qty 250

## 2020-10-28 MED ORDER — VANCOMYCIN HCL IN DEXTROSE 1-5 GM/200ML-% IV SOLN
1000.0000 mg | INTRAVENOUS | Status: AC
  Administered 2020-10-28: 1000 mg via INTRAVENOUS
  Filled 2020-10-28: qty 200

## 2020-10-28 MED ORDER — OXYCODONE HCL 5 MG PO TABS: 5.0000 mg | ORAL_TABLET | Freq: Once | ORAL | Status: AC | PRN

## 2020-10-28 MED ORDER — METHOCARBAMOL 500 MG PO TABS
500.0000 mg | ORAL_TABLET | Freq: Four times a day (QID) | ORAL | Status: DC | PRN
  Administered 2020-10-28 – 2020-10-30 (×5): 500 mg via ORAL
  Filled 2020-10-28 (×5): qty 1

## 2020-10-28 SURGICAL SUPPLY — 63 items
ADH SKN CLS APL DERMABOND .7 (GAUZE/BANDAGES/DRESSINGS) ×1
ATTUNE MED DOME PAT 38 KNEE (Knees) ×1 IMPLANT
ATTUNE PSFEM RTSZ6 NARCEM KNEE (Femur) ×1 IMPLANT
ATTUNE PSRP INSR SZ6 7 KNEE (Insert) ×1 IMPLANT
BAG COUNTER SPONGE SURGICOUNT (BAG) ×2 IMPLANT
BAG SPEC THK2 15X12 ZIP CLS (MISCELLANEOUS) ×1
BAG SPNG CNTER NS LX DISP (BAG) ×1
BAG ZIPLOCK 12X15 (MISCELLANEOUS) ×2 IMPLANT
BASE TIBIA ATTUNE KNEE SYS SZ6 (Knees) IMPLANT
BLADE SAG 18X100X1.27 (BLADE) ×2 IMPLANT
BLADE SAW SGTL 11.0X1.19X90.0M (BLADE) ×2 IMPLANT
BNDG CMPR MED 10X6 ELC LF (GAUZE/BANDAGES/DRESSINGS) ×1
BNDG ELASTIC 3X5.8 VLCR STR LF (GAUZE/BANDAGES/DRESSINGS) ×1 IMPLANT
BNDG ELASTIC 4X5.8 VLCR STR LF (GAUZE/BANDAGES/DRESSINGS) ×2 IMPLANT
BNDG ELASTIC 6X10 VLCR STRL LF (GAUZE/BANDAGES/DRESSINGS) ×1 IMPLANT
BNDG ELASTIC 6X5.8 VLCR STR LF (GAUZE/BANDAGES/DRESSINGS) ×2 IMPLANT
BOWL SMART MIX CTS (DISPOSABLE) ×2 IMPLANT
BSPLAT TIB 6 CMNT ROT PLAT STR (Knees) ×1 IMPLANT
CEMENT HV SMART SET (Cement) ×2 IMPLANT
COVER SURGICAL LIGHT HANDLE (MISCELLANEOUS) ×2 IMPLANT
CUFF TOURN SGL QUICK 34 (TOURNIQUET CUFF) ×2
CUFF TRNQT CYL 34X4.125X (TOURNIQUET CUFF) ×1 IMPLANT
DECANTER SPIKE VIAL GLASS SM (MISCELLANEOUS) ×2 IMPLANT
DERMABOND ADVANCED (GAUZE/BANDAGES/DRESSINGS) ×1
DERMABOND ADVANCED .7 DNX12 (GAUZE/BANDAGES/DRESSINGS) ×1 IMPLANT
DRAPE U-SHAPE 47X51 STRL (DRAPES) ×2 IMPLANT
DRSG AQUACEL AG ADV 3.5X10 (GAUZE/BANDAGES/DRESSINGS) ×2 IMPLANT
DURAPREP 26ML APPLICATOR (WOUND CARE) ×4 IMPLANT
ELECT REM PT RETURN 15FT ADLT (MISCELLANEOUS) ×2 IMPLANT
GLOVE SRG 8 PF TXTR STRL LF DI (GLOVE) ×1 IMPLANT
GLOVE SURG LTX SZ8 (GLOVE) ×2 IMPLANT
GLOVE SURG ORTHO LTX SZ9 (GLOVE) ×2 IMPLANT
GLOVE SURG POLYISO LF SZ7 (GLOVE) ×2 IMPLANT
GLOVE SURG UNDER POLY LF SZ7.5 (GLOVE) ×2 IMPLANT
GLOVE SURG UNDER POLY LF SZ8 (GLOVE) ×2
GOWN STRL REUS W/TWL XL LVL3 (GOWN DISPOSABLE) ×4 IMPLANT
HANDPIECE INTERPULSE COAX TIP (DISPOSABLE) ×2
IRRIGATION SURGIPHOR STRL (IV SOLUTION) ×2 IMPLANT
KIT TURNOVER KIT A (KITS) ×2 IMPLANT
NS IRRIG 1000ML POUR BTL (IV SOLUTION) ×2 IMPLANT
PACK TOTAL KNEE CUSTOM (KITS) ×2 IMPLANT
PENCIL SMOKE EVACUATOR (MISCELLANEOUS) IMPLANT
PIN DRILL FIX HALF THREAD (BIT) ×1 IMPLANT
PIN STEINMAN FIXATION KNEE (PIN) ×2 IMPLANT
PROTECTOR NERVE ULNAR (MISCELLANEOUS) ×2 IMPLANT
SET HNDPC FAN SPRY TIP SCT (DISPOSABLE) ×1 IMPLANT
SET PAD KNEE POSITIONER (MISCELLANEOUS) ×2 IMPLANT
SPONGE SURGIFOAM ABS GEL 100 (HEMOSTASIS) ×2 IMPLANT
SPONGE T-LAP 18X18 ~~LOC~~+RFID (SPONGE) IMPLANT
STOCKINETTE 6  STRL (DRAPES) ×2
STOCKINETTE 6 STRL (DRAPES) ×1 IMPLANT
SUT MNCRL AB 3-0 PS2 18 (SUTURE) ×2 IMPLANT
SUT VIC AB 1 CT1 27 (SUTURE) ×6
SUT VIC AB 1 CT1 27XBRD ANTBC (SUTURE) ×3 IMPLANT
SUT VIC AB 2-0 CT1 27 (SUTURE) ×4
SUT VIC AB 2-0 CT1 TAPERPNT 27 (SUTURE) ×2 IMPLANT
SUT VLOC 180 0 24IN GS25 (SUTURE) ×2 IMPLANT
SYR 50ML LL SCALE MARK (SYRINGE) ×2 IMPLANT
TAPE STRIPS DRAPE STRL (GAUZE/BANDAGES/DRESSINGS) ×2 IMPLANT
TIBIA ATTUNE KNEE SYS BASE SZ6 (Knees) ×2 IMPLANT
TRAY CATH INTERMITTENT SS 16FR (CATHETERS) ×2 IMPLANT
WATER STERILE IRR 1000ML POUR (IV SOLUTION) ×4 IMPLANT
WRAP KNEE MAXI GEL POST OP (GAUZE/BANDAGES/DRESSINGS) ×2 IMPLANT

## 2020-10-28 NOTE — Anesthesia Postprocedure Evaluation (Signed)
Anesthesia Post Note  Patient: Natasha [name]  Procedure(s) Performed: TOTAL KNEE ARTHROPLASTY (Right: Knee)     Patient location during evaluation: Nursing Unit Anesthesia Type: Spinal Level of consciousness: oriented and awake and alert Pain management: pain level controlled Vital Signs Assessment: post-procedure vital signs reviewed and stable Respiratory status: spontaneous breathing and respiratory function stable Cardiovascular status: blood pressure returned to baseline and stable Postop Assessment: no headache, no backache, no apparent nausea or vomiting and patient able to bend at knees Anesthetic complications: no   No notable events documented.  Last Vitals:  Vitals:   10/28/20 1245 10/28/20 1300  BP: (!) 163/77 (!) 161/72  Pulse: (!) 57 61  Resp: 20 13  Temp:  (!) 35.7 C  SpO2: 100% 100%    Last Pain:  Vitals:   10/28/20 1300  TempSrc:   PainSc: 3                  Trevor Iha

## 2020-10-28 NOTE — Anesthesia Procedure Notes (Signed)
Spinal  Patient location during procedure: OR Start time: 10/28/2020 9:05 AM End time: 10/28/2020 9:15 AM Reason for block: surgical anesthesia Staffing Anesthesiologist: Trevor Iha, MD Preanesthetic Checklist Completed: patient identified, IV checked, site marked, risks and benefits discussed, surgical consent, monitors and equipment checked, pre-op evaluation and timeout performed Spinal Block Patient position: sitting Prep: DuraPrep Patient monitoring: heart rate, cardiac monitor, continuous pulse ox and blood pressure Approach: midline Location: L3-4 Injection technique: single-shot Needle Needle type: Sprotte  Needle gauge: 24 G Needle length: 9 cm Needle insertion depth: 7 cm Assessment Sensory level: T4 Events: CSF return and second provider

## 2020-10-28 NOTE — Plan of Care (Signed)

## 2020-10-28 NOTE — Anesthesia Procedure Notes (Signed)
Procedure Name: MAC Date/Time: 10/28/2020 9:05 AM Performed by: West Pugh, CRNA Pre-anesthesia Checklist: Patient identified, Emergency Drugs available, Suction available, Patient being monitored and Timeout performed Patient Re-evaluated:Patient Re-evaluated prior to induction Oxygen Delivery Method: Simple face mask Preoxygenation: Pre-oxygenation with 100% oxygen Induction Type: IV induction Placement Confirmation: positive ETCO2 Dental Injury: Teeth and Oropharynx as per pre-operative assessment

## 2020-10-28 NOTE — Evaluation (Signed)
Physical Therapy Evaluation Patient Details Name: Natasha [name] MRN: [id number] DOB: [birth year] Today's Date: 10/28/2020   History of Present Illness  Patient is 87 y.o. female s/p Rt TKA on 10/28/20 with PMH significant for CHF,GERD, COPD, HTN, hypothyroidism, PAF.   Clinical Impression  Natasha [name] is a 87 y.o. female POD 0 s/p Rt TKA. Patient reports independnece with mobility at baseline. Patient is now limited by functional impairments (see PT problem list below) and requires min assist for transfers and gait with RW. Patient was able to take several small steps to [facility] and then ambulate ~8 feet with RW and min assist to sit in recliner. Further gait deferred 2/2 nausea and pain. Patient instructed in exercise to facilitate circulation to manage edema and reduce risk of DVT. Patient will benefit from continued skilled PT interventions to address impairments and progress towards PLOF. Acute PT will follow to progress mobility and stair training in preparation for safe discharge home.     Follow Up Recommendations Follow surgeon's recommendation for DC plan and follow-up therapies;Home health PT    Equipment Recommendations  None recommended by PT    Recommendations for Other Services       Precautions / Restrictions Precautions Precautions: Fall Restrictions Weight Bearing Restrictions: No Other Position/Activity Restrictions: WBAT      Mobility  Bed Mobility Overal bed mobility: Needs Assistance Bed Mobility: Supine to Sit     Supine to sit: Min assist;HOB elevated          Transfers Overall transfer level: Needs assistance Equipment used: Rolling walker (2 wheeled) Transfers: Sit to/from [employer] Sit to Stand: Min assist;From elevated surface Stand pivot transfers: Min assist       General transfer comment: cues for hand placement and technique with RW. min assist to rise and steady with stand step transfer  bed>BSC.  Ambulation/Gait Ambulation/Gait assistance: Min assist Gait Distance (Feet): 8 Feet Assistive device: Rolling walker (2 wheeled) Gait Pattern/deviations: Step-to pattern;Decreased stride length;Decreased stance time - right;Decreased weight shift to right Gait velocity: decr   General Gait Details: pt ambulated short distance in room from [facility] to recliner, min assist to steady and prevent LOB  Stairs            Wheelchair Mobility    Modified Rankin (Stroke Patients Only)       Balance Overall balance assessment: Needs assistance Sitting-balance support: Feet supported;Bilateral upper extremity supported Sitting balance-Leahy Scale: Good     Standing balance support: During functional activity;Bilateral upper extremity supported Standing balance-Leahy Scale: Poor                               Pertinent Vitals/Pain Pain Assessment: Faces Faces Pain Scale: Hurts even more Pain Location: Rt knee and nausea Pain Descriptors / Indicators: Aching;Discomfort Pain Intervention(s): Limited activity within patient's tolerance;Monitored during session;Repositioned;Patient requesting pain meds-RN notified    Home Living Family/patient expects to be discharged to:: Private residence Living Arrangements: Spouse/significant other Available Help at Discharge: Family Type of Home: House Home Access: Stairs to enter Entrance Stairs-Rails: Doctor, general practice of Steps: 4 Home Layout: Two level;Full bath on main level;Able to live on main level with bedroom/bathroom Home Equipment: Dan Humphreys - 2 wheels;Cane - single point;Bedside commode;Shower seat;Grab bars - tub/shower      Prior Function Level of Independence: Independent               Hand Dominance  Dominant Hand: Right    Extremity/Trunk Assessment   Upper Extremity Assessment Upper Extremity Assessment: Overall WFL for tasks assessed    Lower Extremity Assessment Lower  Extremity Assessment: RLE deficits/detail RLE Deficits / Details: good quad activation, no extensor lag with SLR RLE Sensation: WNL RLE Coordination: WNL    Cervical / Trunk Assessment Cervical / Trunk Assessment: Normal  Communication   Communication: No difficulties  Cognition Arousal/Alertness: Awake/alert Behavior During Therapy: WFL for tasks assessed/performed Overall Cognitive Status: Within Functional Limits for tasks assessed                                        General Comments      Exercises Total Joint Exercises Ankle Circles/Pumps: AROM;Both;20 reps;Seated   Assessment/Plan    PT Assessment Patient needs continued PT services  PT Problem List Decreased strength;Decreased range of motion;Decreased activity tolerance;Decreased balance;Decreased mobility;Decreased knowledge of use of DME;Decreased knowledge of precautions;Pain       PT Treatment Interventions DME instruction;Stair training;Gait training;Functional mobility training;Therapeutic activities;Therapeutic exercise;Balance training;Patient/family education    PT Goals (Current goals can be found in the Care Plan section)  Acute Rehab PT Goals Patient Stated Goal: regain independence PT Goal Formulation: With patient Time For Goal Achievement: 11/04/20 Potential to Achieve Goals: Good    Frequency 7X/week   Barriers to discharge        Co-evaluation               AM-PAC PT "6 Clicks" Mobility  Outcome Measure Help needed turning from your back to your side while in a flat bed without using bedrails?: A Little Help needed moving from lying on your back to sitting on the side of a flat bed without using bedrails?: A Little Help needed moving to and from a bed to a chair (including a wheelchair)?: A Little Help needed standing up from a chair using your arms (e.g., wheelchair or bedside chair)?: A Little Help needed to walk in hospital room?: A Little Help needed climbing  3-5 steps with a railing? : A Little 6 Click Score: 18    End of Session Equipment Utilized During Treatment: Gait belt Activity Tolerance: Patient tolerated treatment well Patient left: in chair;with call bell/phone within reach;with chair alarm set;with family/visitor present Nurse Communication: Mobility status PT Visit Diagnosis: Muscle weakness (generalized) (M62.81);Difficulty in walking, not elsewhere classified (R26.2);Pain Pain - Right/Left: Right Pain - part of body: Knee    Time: 8588-5027 PT Time Calculation (min) (ACUTE ONLY): 21 min   Charges:   PT Evaluation $PT Eval Low Complexity: 1 Low          Wynn Maudlin, DPT Acute Rehabilitation Services Office [phone] Pager [phone]   Anitra Lauth 10/28/2020, 5:30 PM

## 2020-10-28 NOTE — Progress Notes (Addendum)
Pharmacy Antibiotic Note  Natasha [name] is a 87 y.o. female admitted on 10/28/2020 for  R TKR. Pharmacy has been consulted for vancomycin dosing for surgical prophylaxis . Vancomycin 1000 mg given preop at 0730 am.   Plan: Vancomycin 1000 mg IV x 1 dose tonight at 8 pm Pharmacy to sign off  Height: 5\' 8"  (172.7 cm) Weight: 91.2 kg (201 lb 1 oz) IBW/kg (Calculated) : 63.9  Temp (24hrs), Avg:96.3 F (35.7 C), Min:95.5 F (35.3 C), Max:97 F (36.1 C)  Recent Labs  Lab 10/28/20 0640  CREATININE 0.69    Estimated Creatinine Clearance: 59.6 mL/min (by C-G formula based on SCr of 0.69 mg/dL).    Allergies  Allergen Reactions   Codeine Nausea And Vomiting and Nausea Only   Penicillins Hives    Has patient had a PCN reaction causing immediate rash, facial/tongue/throat swelling, SOB or lightheadedness with hypotension: No Has patient had a PCN reaction causing severe rash involving mucus membranes or skin necrosis: No Has patient had a PCN reaction that required hospitalization No Has patient had a PCN reaction occurring within the last 10 years: No If all of the above answers are "NO", then may proceed with Cephalosporin use.   Tetracycline Rash   Tramadol Nausea Only   Digoxin Other (See Comments)    Flushed neck and face   Digoxin And Related Other (See Comments)    ' Makes my face blood red"   Erythromycin Other (See Comments) and Hives    Flushing on face   Other Other (See Comments)    ECG leads cause skin irritation. ECG leads cause skin irritation.   Sulfa Antibiotics Other (See Comments)    PRESERVATIVE OF SOME FOODS- MAKES FLUSHES TO FACE AND NECK   Tetracyclines & Related Other (See Comments)    "Sores on legs"   Diltiazem Hcl Rash    Rash with long acting form   Lyrica [Pregabalin] Rash   Sulfites Rash    PRESERVATIVE OF SOME FOODS- MAKES FLUSHES TO FACE AND NECK PRESERVATIVE OF SOME FOODS- MAKES FLUSHES TO FACE AND NECK    Thank you for allowing pharmacy  to be a part of this patient's care.  10/30/20, Pharm.D 10/28/2020 1:38 PM

## 2020-10-28 NOTE — Op Note (Signed)
DATE OF SURGERY:  10/28/2020  TIME: 10:53 AM  PATIENT NAME:  Natasha [name]    AGE: 87 y.o.   PRE-OPERATIVE DIAGNOSIS:  Osteoarthritis righ tknee  POST-OPERATIVE DIAGNOSIS:  Osteoarthritis righ tknee  PROCEDURE:  Procedure(s): TOTAL KNEE ARTHROPLASTY  SURGEON:  Dayvin Aber ANDREW  ASSISTANT:  Leeanne Haus, PA-C, present and scrubbed throughout the case, critical for assistance with exposure, retraction, instrumentation, and closure.  OPERATIVE IMPLANTS: Depuy PFC Attune Rotating Platform.  Femur size 6, Tibia size 6, Patella size 38 3-peg oval button, with a 7 mm polyethylene insert.   PREOPERATIVE INDICATIONS:   [name] is a 87 y.o. year old female with end stage bone on bone arthritis of the knee who failed conservative treatment and elected for Total Knee Arthroplasty.   The risks, benefits, and alternatives were discussed at length including but not limited to the risks of infection, bleeding, nerve injury, stiffness, blood clots, the need for revision surgery, cardiopulmonary complications, among others, and they were willing to proceed.  OPERATIVE DESCRIPTION:  The patient was brought to the operative room and placed in a supine position.  Spinal anesthesia was administered.  IV antibiotics were given.  The lower extremity was prepped and draped in the usual sterile fashion.  Time out was performed.  The leg was elevated and exsanguinated and the tourniquet was inflated.  Anterior quadriceps tendon splitting approach was performed.  The patella was retracted and osteophytes were removed.  The anterior horn of the medial and lateral meniscus was removed and cruciate ligaments resected.   The distal femur was opened with the drill and the intramedullary distal femoral cutting jig was utilized, set at 5 degrees resecting 10 mm off the distal femur.  Care was taken to protect the collateral ligaments.  The distal femoral sizing jig was applied, taking care to avoid  notching.  Then the 4-in-1 cutting jig was applied and the anterior and posterior femur was cut, along with the chamfer cuts.    Then the extramedullary tibial cutting jig was utilized making the appropriate cut using the anterior tibial crest as a reference building in appropriate posterior slope.  Care was taken during the cut to protect the medial and collateral ligaments.  The proximal tibia was removed along with the posterior horns of the menisci.   The posterior medial femoral osteophytes and posterior lateral femoral osteophytes were removed.    The flexion gap was then measured and was symmetric with the extension gap, measured at 7.  I completed the distal femoral preparation using the appropriate jig to prepare the box.  The patella was then measured, and cut with the saw.    The proximal tibia sized and prepared accordingly with the reamer and the punch, and then all components were trialed with the trial insert.  The knee was found to have excellent balance and full motion.    The above named components were then cemented into place and all excess cement was removed.  The trial polyethylene component was in place during cementation, and then was exchanged for the real polyethylene component.    The knee was easily taken through a range of motion and the patella tracked well and the knee irrigated copiously and the parapatellar and subcutaneous tissue closed with vicryl, and monocryl with steri strips for the skin.  The arthrotomy was closed at 90 of flexion. The wounds were dressed with sterile gauze and the tourniquet released and the patient was awakened and returned to the PACU in  stable and satisfactory condition.  There were no complications.  Total tourniquet time was 80 minutes.

## 2020-10-28 NOTE — Care Plan (Signed)
Ortho Bundle Case Management Note  Patient Details  Name: MEAGON DUSKIN MRN: [id number] Date of Birth: 06-Dec-1932  R TKA on 10-28-20 DCP:  Home with husband.  2 story home with 4-5 ste DME:  No needs.  Has a RW and 3-in-1 PT:  HHC with [facility].  Orders provided to Albertine Patricia.                   DME Arranged:  N/A DME Agency:  NA  HH Arranged:  PT HH Agency:  [facility] (now [facility])  Additional Comments: Please contact me with any questions of if this plan should need to change.  Ennis Forts, RN,CCM [facility]  [phone] 10/28/2020, 11:42 AM

## 2020-10-28 NOTE — Interval H&P Note (Signed)
History and Physical Interval Note:  10/28/2020 9:00 AM  Natasha [name]  has presented today for surgery, with the diagnosis of Osteoarthritis righ tknee.  The various methods of treatment have been discussed with the patient and family. After consideration of risks, benefits and other options for treatment, the patient has consented to  Procedure(s) with comments: TOTAL KNEE ARTHROPLASTY (Right) - with adductor canal as a surgical intervention.  The patient's history has been reviewed, patient examined, no change in status, stable for surgery.  I have reviewed the patient's chart and labs.  Questions were answered to the patient's satisfaction.     [name] ANDREW

## 2020-10-28 NOTE — Transfer of Care (Signed)
Immediate Anesthesia Transfer of Care Note  Patient: Natasha [name]  Procedure(s) Performed: TOTAL KNEE ARTHROPLASTY (Right: Knee)  Patient Location: PACU  Anesthesia Type:Spinal  Level of Consciousness: awake, alert  and oriented  Airway & Oxygen Therapy: Patient Spontanous Breathing and Patient connected to face mask oxygen  Post-op Assessment: Report given to RN and Post -op Vital signs reviewed and stable  Post vital signs: Reviewed and stable  Last Vitals:  Vitals Value Taken Time  BP 141/79 10/28/20 1121  Temp    Pulse 68 10/28/20 1122  Resp 19 10/28/20 1122  SpO2 100 % 10/28/20 1122  Vitals shown include unvalidated device data.  Last Pain:  Vitals:   10/28/20 0655  TempSrc:   PainSc: 0-No pain         Complications: No notable events documented.

## 2020-10-28 NOTE — Anesthesia Procedure Notes (Addendum)
Anesthesia Regional Block: Adductor canal block   Pre-Anesthetic Checklist: , timeout performed,  Correct Patient, Correct Site, Correct Laterality,  Correct Procedure, Correct Position, site marked,  Risks and benefits discussed,  Surgical consent,  Pre-op evaluation,  At surgeon's request and post-op pain management  Laterality: Lower and Right  Prep: chloraprep       Needles:  Injection technique: Single-shot  Needle Type: Echogenic Needle     Needle Length: 9cm  Needle Gauge: 22     Additional Needles:   Procedures:,,,, ultrasound used (permanent image in chart),,    Narrative:  Start time: 10/28/2020 7:53 AM End time: 10/28/2020 7:58 AM Injection made incrementally with aspirations every 5 mL.  Performed by: Personally  Anesthesiologist: Trevor Iha, MD  Additional Notes: Block assessed prior to surgery. Pt tolerated procedure well.

## 2020-10-29 ENCOUNTER — Encounter (HOSPITAL_COMMUNITY): Payer: Self-pay | Admitting: Specialist

## 2020-10-29 DIAGNOSIS — M1711 Unilateral primary osteoarthritis, right knee: Secondary | ICD-10-CM | POA: Diagnosis not present

## 2020-10-29 DIAGNOSIS — I4819 Other persistent atrial fibrillation: Secondary | ICD-10-CM | POA: Diagnosis not present

## 2020-10-29 DIAGNOSIS — I5032 Chronic diastolic (congestive) heart failure: Secondary | ICD-10-CM | POA: Diagnosis not present

## 2020-10-29 DIAGNOSIS — I110 Hypertensive heart disease with heart failure: Secondary | ICD-10-CM | POA: Diagnosis not present

## 2020-10-29 DIAGNOSIS — J449 Chronic obstructive pulmonary disease, unspecified: Secondary | ICD-10-CM | POA: Diagnosis not present

## 2020-10-29 DIAGNOSIS — E039 Hypothyroidism, unspecified: Secondary | ICD-10-CM | POA: Diagnosis not present

## 2020-10-29 MED ORDER — PROMETHAZINE HCL 12.5 MG PO TABS: 12.5000 mg | ORAL_TABLET | ORAL | 0 refills | Status: DC | PRN

## 2020-10-29 MED ORDER — METHOCARBAMOL 500 MG PO TABS: 500.0000 mg | ORAL_TABLET | Freq: Four times a day (QID) | ORAL | 0 refills | Status: DC

## 2020-10-29 MED ORDER — HYDROCODONE-ACETAMINOPHEN 5-325 MG PO TABS: 1.0000 | ORAL_TABLET | ORAL | 0 refills | Status: AC | PRN

## 2020-10-29 MED ORDER — LACTINEX PO CHEW: 1.0000 | CHEWABLE_TABLET | Freq: Every day | ORAL | 0 refills | Status: AC

## 2020-10-29 NOTE — Plan of Care (Signed)
  Problem: Coping: Goal: Level of anxiety will decrease Outcome: Progressing   Problem: Pain Managment: Goal: General experience of comfort will improve Outcome: Progressing   Problem: Safety: Goal: Ability to remain free from injury will improve Outcome: Progressing   Problem: Activity: Goal: Ability to avoid complications of mobility impairment will improve Outcome: Progressing

## 2020-10-29 NOTE — Plan of Care (Signed)
  Problem: Education: Goal: Knowledge of General Education information will improve Description: Including pain rating scale, medication(s)/side effects and non-pharmacologic comfort measures Outcome: Progressing   Problem: Pain Managment: Goal: General experience of comfort will improve Outcome: Progressing   Problem: Safety: Goal: Ability to remain free from injury will improve Outcome: Progressing   

## 2020-10-29 NOTE — Plan of Care (Signed)
  Problem: Coping: Goal: Level of anxiety will decrease Outcome: Progressing   Problem: Pain Managment: Goal: General experience of comfort will improve Outcome: Progressing   Problem: Safety: Goal: Ability to remain free from injury will improve Outcome: Progressing   

## 2020-10-29 NOTE — Progress Notes (Signed)
Physical Therapy Treatment Patient Details Name: Natasha [name] MRN: [id number] DOB: 11/16/1932 Today's Date: 10/29/2020    History of Present Illness Patient is 87 y.o. female s/p Rt TKA on 10/28/20 with PMH significant for CHF,GERD, COPD, HTN, hypothyroidism, PAF.    PT Comments    Pt very cooperative and progressing with mobility.  This am, pt performed therex program with assist and OOB for toileting and to ambulate increased distance in hall.   Follow Up Recommendations  Follow surgeon's recommendation for DC plan and follow-up therapies;Home health PT     Equipment Recommendations  None recommended by PT    Recommendations for Other Services       Precautions / Restrictions Precautions Precautions: Fall Restrictions Weight Bearing Restrictions: No Other Position/Activity Restrictions: WBAT    Mobility  Bed Mobility Overal bed mobility: Needs Assistance Bed Mobility: Supine to Sit     Supine to sit: Min assist;HOB elevated     General bed mobility comments: Min assist for R LE managment    Transfers Overall transfer level: Needs assistance Equipment used: Rolling walker (2 wheeled) Transfers: Sit to/from [employer] Sit to Stand: Min assist;From elevated surface Stand pivot transfers: Min assist       General transfer comment: cues for hand placement and technique with RW. min assist to rise and steady with stand step transfer bed>BSC.  Ambulation/Gait Ambulation/Gait assistance: Min assist Gait Distance (Feet): 68 Feet Assistive device: Rolling walker (2 wheeled) Gait Pattern/deviations: Step-to pattern;Decreased stride length;Decreased stance time - right;Decreased weight shift to right Gait velocity: decr   General Gait Details: Cues for sequence, posture and position from RW.  Increased time with multiple standing rest breaks   Stairs             Wheelchair Mobility    Modified Rankin (Stroke Patients Only)        Balance Overall balance assessment: Needs assistance Sitting-balance support: Feet supported;No upper extremity supported Sitting balance-Leahy Scale: Good     Standing balance support: Bilateral upper extremity supported Standing balance-Leahy Scale: Poor                              Cognition Arousal/Alertness: Awake/alert Behavior During Therapy: WFL for tasks assessed/performed Overall Cognitive Status: Within Functional Limits for tasks assessed                                        Exercises Total Joint Exercises Ankle Circles/Pumps: AROM;Both;20 reps;Seated Quad Sets: AROM;Both;10 reps;Supine Heel Slides: AAROM;Right;15 reps;Supine Straight Leg Raises: AAROM;Right;20 reps;Supine Goniometric ROM: AAROM R knee -8  - 40    General Comments        Pertinent Vitals/Pain Pain Assessment: 0-10 Pain Score: 5  Pain Location: R knee Pain Descriptors / Indicators: Aching;Sore Pain Intervention(s): Limited activity within patient's tolerance;Monitored during session;Premedicated before session;Ice applied    Home Living                      Prior Function            PT Goals (current goals can now be found in the care plan section) Acute Rehab PT Goals Patient Stated Goal: regain independence PT Goal Formulation: With patient Time For Goal Achievement: 11/04/20 Potential to Achieve Goals: Good Progress towards PT goals: Progressing toward goals    Frequency  7X/week      PT Plan Current plan remains appropriate    Co-evaluation              AM-PAC PT "6 Clicks" Mobility   Outcome Measure  Help needed turning from your back to your side while in a flat bed without using bedrails?: A Little Help needed moving from lying on your back to sitting on the side of a flat bed without using bedrails?: A Little Help needed moving to and from a bed to a chair (including a wheelchair)?: A Little Help needed standing up  from a chair using your arms (e.g., wheelchair or bedside chair)?: A Little Help needed to walk in hospital room?: A Little Help needed climbing 3-5 steps with a railing? : A Lot 6 Click Score: 17    End of Session Equipment Utilized During Treatment: Gait belt Activity Tolerance: Patient tolerated treatment well Patient left: in chair;with call bell/phone within reach;with chair alarm set;with family/visitor present Nurse Communication: Mobility status PT Visit Diagnosis: Muscle weakness (generalized) (M62.81);Difficulty in walking, not elsewhere classified (R26.2);Pain Pain - Right/Left: Right Pain - part of body: Knee     Time: 1003-1043 PT Time Calculation (min) (ACUTE ONLY): 40 min  Charges:  $Gait Training: 8-22 mins $Therapeutic Exercise: 8-22 mins $Therapeutic Activity: 8-22 mins                     Mauro Kaufmann PT Acute Rehabilitation Services Pager [phone] Office [phone]    Natasha [name] 10/29/2020, 10:53 AM

## 2020-10-29 NOTE — Progress Notes (Signed)
RN went to the room. Pt was resting on the bed. RN assessed the operative knee, there was moderate sanguineous drainage on dressing with some on blankets and sheets. Compression wrap removed, dressing reinforced with abdominal pads, kerelex and new compression wrap applied. Pedal pulse 2+, Rt foot CMS was within normal limit.

## 2020-10-29 NOTE — TOC Transition Note (Signed)
Transition of Care [facility]) - CM/SW Discharge Note  Patient Details  Name: Natasha [name] MRN: [id number] Date of Birth: Mar 17, 1933  Transition of Care [facility]) CM/SW Contact:  Sherie Don, LCSW Phone Number: 10/29/2020, 9:29 AM  Clinical Narrative: Patient expected to discharge after working with PT. CSW met with patient and her spouse to confirm discharge plan. Patient has been prearranged with HHPT through [place]. Patient has a rolling walker and 3N1 as well as a walk-in shower, so there are no DME needs at this time. TOC signing off.  Final next level of care: [place] Barriers to Discharge: No Barriers Identified  Patient Goals and CMS Choice Patient states their goals for this hospitalization and ongoing recovery are:: Discharge home with [place] CMS Medicare.gov Compare Post Acute Care list provided to:: Patient Choice offered to / list presented to : Patient  Discharge Plan and Services         DME Arranged: N/A DME Agency: NA HH Arranged: PT HH Agency: [place] Representative spoke with at [place]: Pre-arranged in orthopedist's office  Readmission Risk Interventions No flowsheet data found.

## 2020-10-29 NOTE — Progress Notes (Signed)
Subjective: 1 Day Post-Op Procedure(s) (LRB): TOTAL KNEE ARTHROPLASTY (Right) Patient reports pain as mild.   Patient had a big problem with Nausea yesterday. Had some difficulty with getting up for PT due to it Denies any fever/ chills  Objective: Vital signs in last 24 hours: Temp:  [95.5 F (35.3 C)-98 F (36.7 C)] 97.5 F (36.4 C) (07/14 0539) Pulse Rate:  [57-93] 93 (07/14 0539) Resp:  [9-22] 17 (07/14 0539) BP: (114-182)/(55-91) 114/55 (07/14 0539) SpO2:  [94 %-100 %] 96 % (07/14 0539)  Intake/Output from previous day: 07/13 0701 - 07/14 0700 In: 3267.9 [P.O.:720; I.V.:2165.9; IV Piggyback:382.1] Out: 1100 [Urine:1050; Blood:50] Intake/Output this shift: Total I/O In: 1947.9 [P.O.:600; I.V.:1065.9; IV Piggyback:282.1] Out: 650 [Urine:650]  No results for input(s): HGB in the last 72 hours. No results for input(s): WBC, RBC, HCT, PLT in the last 72 hours. Recent Labs    10/28/20 0640  NA 132*  K 4.0  CL 102  CO2 23  BUN 13  CREATININE 0.69  GLUCOSE 92  CALCIUM 9.0   No results for input(s): LABPT, INR in the last 72 hours.  Neurologically intact Neurovascular intact Sensation intact distally Intact pulses distally Dorsiflexion/Plantar flexion intact Incision: dressing C/D/I Compartment soft   Assessment/Plan: 1 Day Post-Op Procedure(s) (LRB): TOTAL KNEE ARTHROPLASTY (Right) Advance diet Up with therapy if patient is able to get up with therapy and they are cleared okay to be discharged home today Discharge home with home health Medications have been sent to pharmacy  Will start her Eliquis this am for DVT prophylaxis   Patient's anticipated LOS is less than 2 midnights, meeting these requirements: - Younger than 43 - Lives within 1 hour of care - Has a competent adult at home to recover with post-op recover - NO history of  - Chronic pain requiring opiods  - Diabetes  - Coronary Artery Disease  - Heart failure  - Heart attack  - Stroke  -  DVT/VTE  - Cardiac arrhythmia  - Respiratory Failure/COPD  - Renal failure  - Anemia  - Advanced Liver disease     Denman George [facility] [phone] 10/29/2020, 6:45 AM

## 2020-10-29 NOTE — Progress Notes (Signed)
Physical Therapy Treatment Patient Details Name: Natasha [name] MRN: [id number] DOB: [birth year] Today's Date: 10/29/2020    History of Present Illness Patient is 87 y.o. female s/p Rt TKA on 10/28/20 with PMH significant for CHF,GERD, COPD, HTN, hypothyroidism, PAF.    PT Comments    Pt continues very cooperative but with limited activity tolerance this pm 2* c/o nausea, fatigue, dizziness and "feeling so hot".  Pt up for toileting and ambulated limited distance into hall before leaning over against wall and c/o above listed symptoms - BP 157/79.  Pt assisted to chair, transported back to room and assisted back to bed.  Follow Up Recommendations  Follow surgeon's recommendation for DC plan and follow-up therapies;Home health PT     Equipment Recommendations  None recommended by PT    Recommendations for Other Services       Precautions / Restrictions Precautions Precautions: Fall Restrictions Weight Bearing Restrictions: No Other Position/Activity Restrictions: WBAT    Mobility  Bed Mobility Overal bed mobility: Needs Assistance Bed Mobility: Sit to Supine       Sit to supine: Min assist   General bed mobility comments: Min assist for R LE managment    Transfers Overall transfer level: Needs assistance Equipment used: Rolling walker (2 wheeled) Transfers: Sit to/from [employer] Sit to Stand: Min assist;From elevated surface Stand pivot transfers: Min assist       General transfer comment: cues for hand placement and technique with RW. min assist to rise and steady with stand step transfer recliner>BSC and recliner >EOB  Ambulation/Gait Ambulation/Gait assistance: Min assist Gait Distance (Feet): 35 Feet Assistive device: Rolling walker (2 wheeled) Gait Pattern/deviations: Step-to pattern;Decreased stride length;Decreased stance time - right;Decreased weight shift to right Gait velocity: decr   General Gait Details: Cues for sequence, posture  and position from RW.  Increased time with multiple standing rest breaks.  Distance ltd by nausea, fatigue and dizziness   Stairs             Wheelchair Mobility    Modified Rankin (Stroke Patients Only)       Balance Overall balance assessment: Needs assistance Sitting-balance support: Feet supported;No upper extremity supported Sitting balance-Leahy Scale: Good     Standing balance support: Bilateral upper extremity supported Standing balance-Leahy Scale: Poor                              Cognition Arousal/Alertness: Awake/alert Behavior During Therapy: WFL for tasks assessed/performed Overall Cognitive Status: Within Functional Limits for tasks assessed                                        Exercises      General Comments        Pertinent Vitals/Pain Pain Assessment: 0-10 Pain Score: 6  Pain Location: R knee Pain Descriptors / Indicators: Aching;Sore Pain Intervention(s): Limited activity within patient's tolerance;Monitored during session;Premedicated before session;Ice applied    Home Living                      Prior Function            PT Goals (current goals can now be found in the care plan section) Acute Rehab PT Goals Patient Stated Goal: regain independence PT Goal Formulation: With patient Time For Goal Achievement: 11/04/20 Potential to Achieve  Goals: Good Progress towards PT goals: Not progressing toward goals - comment (nausea, decreased activity tolerance)    Frequency    7X/week      PT Plan Current plan remains appropriate    Co-evaluation              AM-PAC PT "6 Clicks" Mobility   Outcome Measure  Help needed turning from your back to your side while in a flat bed without using bedrails?: A Little Help needed moving from lying on your back to sitting on the side of a flat bed without using bedrails?: A Little Help needed moving to and from a bed to a chair (including a  wheelchair)?: A Little Help needed standing up from a chair using your arms (e.g., wheelchair or bedside chair)?: A Little Help needed to walk in hospital room?: A Little Help needed climbing 3-5 steps with a railing? : A Lot 6 Click Score: 17    End of Session Equipment Utilized During Treatment: Gait belt Activity Tolerance: Patient limited by fatigue;Other (comment) (nausea) Patient left: in bed;with call bell/phone within reach;with bed alarm set;with family/visitor present Nurse Communication: Mobility status PT Visit Diagnosis: Muscle weakness (generalized) (M62.81);Difficulty in walking, not elsewhere classified (R26.2);Pain Pain - Right/Left: Right Pain - part of body: Knee     Time: 1350-1425 PT Time Calculation (min) (ACUTE ONLY): 35 min  Charges:  $Gait Training: 8-22 mins $Therapeutic Activity: 8-22 mins                     Mauro Kaufmann PT Acute Rehabilitation Services Pager [phone] Office [phone]    Natasha [name] 10/29/2020, 3:30 PM

## 2020-10-30 DIAGNOSIS — I110 Hypertensive heart disease with heart failure: Secondary | ICD-10-CM | POA: Diagnosis not present

## 2020-10-30 DIAGNOSIS — J449 Chronic obstructive pulmonary disease, unspecified: Secondary | ICD-10-CM | POA: Diagnosis not present

## 2020-10-30 DIAGNOSIS — E039 Hypothyroidism, unspecified: Secondary | ICD-10-CM | POA: Diagnosis not present

## 2020-10-30 DIAGNOSIS — I4819 Other persistent atrial fibrillation: Secondary | ICD-10-CM | POA: Diagnosis not present

## 2020-10-30 DIAGNOSIS — I5032 Chronic diastolic (congestive) heart failure: Secondary | ICD-10-CM | POA: Diagnosis not present

## 2020-10-30 DIAGNOSIS — M1711 Unilateral primary osteoarthritis, right knee: Secondary | ICD-10-CM | POA: Diagnosis not present

## 2020-10-30 NOTE — Progress Notes (Signed)
Physical Therapy Treatment Patient Details Name: Natasha [name] MRN: [id number] DOB: 12/03/1932 Today's Date: 10/30/2020    History of Present Illness Patient is 87 y.o. female s/p Rt TKA on 10/28/20 with PMH significant for CHF,GERD, COPD, HTN, hypothyroidism, PAF.    PT Comments    Pt continues motivated and with no c/o dizziness.  Pt up to ambulate increased distance in hall, negotiated stairs and performed toileting prior to returning to recliner.  Spouse present for session.  Pt and spouse eager for dc home this date.   Follow Up Recommendations  Follow surgeon's recommendation for DC plan and follow-up therapies;Home health PT     Equipment Recommendations  None recommended by PT    Recommendations for Other Services       Precautions / Restrictions Precautions Precautions: Fall Restrictions Weight Bearing Restrictions: No RLE Weight Bearing: Weight bearing as tolerated    Mobility  Bed Mobility               General bed mobility comments: Up in chair and requests back to same    Transfers Overall transfer level: Needs assistance Equipment used: Rolling walker (2 wheeled) Transfers: Sit to/from [employer] Transfers Sit to Stand: Min guard;Supervision Stand pivot transfers: Min guard       General transfer comment: cues for hand placement and technique with RW. min guard to rise and steady with stand step transfer BSC to recliner  Ambulation/Gait Ambulation/Gait assistance: Min guard;Supervision Gait Distance (Feet): 100 Feet Assistive device: Rolling walker (2 wheeled) Gait Pattern/deviations: Step-to pattern;Decreased stride length;Decreased stance time - right;Decreased weight shift to right;Trunk flexed Gait velocity: decr   General Gait Details: Cues for sequence, posture and position from RW.   Stairs Stairs: Yes Stairs assistance: Min assist Stair Management: One rail Right;Step to pattern;Forwards;With cane Number of Stairs:  4 General stair comments: 2+2 steps with rail, QC and cues for sequence and foot/QC placement   Wheelchair Mobility    Modified Rankin (Stroke Patients Only)       Balance Overall balance assessment: Needs assistance Sitting-balance support: Feet supported;No upper extremity supported Sitting balance-Leahy Scale: Good     Standing balance support: Bilateral upper extremity supported Standing balance-Leahy Scale: Poor                              Cognition Arousal/Alertness: Awake/alert Behavior During Therapy: WFL for tasks assessed/performed Overall Cognitive Status: Within Functional Limits for tasks assessed                                        Exercises      General Comments        Pertinent Vitals/Pain Pain Assessment: 0-10 Pain Score: 5  Pain Location: R knee Pain Descriptors / Indicators: Aching;Sore Pain Intervention(s): Limited activity within patient's tolerance;Monitored during session;Premedicated before session;Ice applied    Home Living                      Prior Function            PT Goals (current goals can now be found in the care plan section) Acute Rehab PT Goals Patient Stated Goal: regain independence PT Goal Formulation: With patient Time For Goal Achievement: 11/04/20 Potential to Achieve Goals: Good Progress towards PT goals: Progressing toward goals    Frequency  7X/week      PT Plan Current plan remains appropriate    Co-evaluation              AM-PAC PT "6 Clicks" Mobility   Outcome Measure  Help needed turning from your back to your side while in a flat bed without using bedrails?: A Little Help needed moving from lying on your back to sitting on the side of a flat bed without using bedrails?: A Little Help needed moving to and from a bed to a chair (including a wheelchair)?: A Little Help needed standing up from a chair using your arms (e.g., wheelchair or bedside  chair)?: A Little Help needed to walk in hospital room?: A Little Help needed climbing 3-5 steps with a railing? : A Little 6 Click Score: 18    End of Session Equipment Utilized During Treatment: Gait belt Activity Tolerance: Patient tolerated treatment well Patient left: in chair;with call bell/phone within reach;with chair alarm set Nurse Communication: Mobility status PT Visit Diagnosis: Muscle weakness (generalized) (M62.81);Difficulty in walking, not elsewhere classified (R26.2);Pain Pain - Right/Left: Right Pain - part of body: Knee     Time: 1005-1040 PT Time Calculation (min) (ACUTE ONLY): 35 min  Charges:  $Gait Training: 8-22 mins $Therapeutic Activity: 8-22 mins                     Mauro Kaufmann PT Acute Rehabilitation Services Pager [phone] Office [phone]    Holley Kocurek 10/30/2020, 1:31 PM

## 2020-10-30 NOTE — Progress Notes (Signed)
Physical Therapy Treatment Patient Details Name: Natasha [name] MRN: [id number] DOB: 1932/05/29 Today's Date: 10/30/2020    History of Present Illness Patient is 87 y.o. female s/p Rt TKA on 10/28/20 with PMH significant for CHF,GERD, COPD, HTN, hypothyroidism, PAF.    PT Comments    Pt with improvement in pain control and with no c/o dizziness.  Pt performed HEP with assist and OOB for toileting and then up to chair for bfast.  Follow Up Recommendations  Follow surgeon's recommendation for DC plan and follow-up therapies;Home health PT     Equipment Recommendations  None recommended by PT    Recommendations for Other Services       Precautions / Restrictions Precautions Precautions: Fall Restrictions Weight Bearing Restrictions: No RLE Weight Bearing: Weight bearing as tolerated    Mobility  Bed Mobility Overal bed mobility: Needs Assistance Bed Mobility: Supine to Sit     Supine to sit: Min guard     General bed mobility comments: cues for sequence with pt self assisting R LE with belt    Transfers Overall transfer level: Needs assistance Equipment used: Rolling walker (2 wheeled) Transfers: Sit to/from [employer] Sit to Stand: Min assist;From elevated surface Stand pivot transfers: Min assist       General transfer comment: cues for hand placement and technique with RW. min assist to rise and steady with stand step transfer rbed>BSC and BSC to recliner  Ambulation/Gait Ambulation/Gait assistance: Min assist Gait Distance (Feet): 5 Feet Assistive device: Rolling walker (2 wheeled) Gait Pattern/deviations: Step-to pattern;Decreased stride length;Decreased stance time - right;Decreased weight shift to right Gait velocity: decr   General Gait Details: Cues for sequence, posture and position from RW.   Stairs             Wheelchair Mobility    Modified Rankin (Stroke Patients Only)       Balance Overall balance assessment:  Needs assistance Sitting-balance support: Feet supported;No upper extremity supported Sitting balance-Leahy Scale: Good     Standing balance support: Bilateral upper extremity supported Standing balance-Leahy Scale: Poor                              Cognition Arousal/Alertness: Awake/alert Behavior During Therapy: WFL for tasks assessed/performed Overall Cognitive Status: Within Functional Limits for tasks assessed                                        Exercises Total Joint Exercises Ankle Circles/Pumps: AROM;Both;20 reps;Seated Quad Sets: AROM;Both;10 reps;Supine Heel Slides: AAROM;Right;15 reps;Supine Straight Leg Raises: AAROM;Right;20 reps;Supine    General Comments        Pertinent Vitals/Pain Pain Assessment: 0-10 Pain Score: 5  Pain Location: R knee Pain Descriptors / Indicators: Aching;Sore Pain Intervention(s): Limited activity within patient's tolerance;Monitored during session;Premedicated before session;Ice applied    Home Living                      Prior Function            PT Goals (current goals can now be found in the care plan section) Acute Rehab PT Goals Patient Stated Goal: regain independence PT Goal Formulation: With patient Time For Goal Achievement: 11/04/20 Potential to Achieve Goals: Good Progress towards PT goals: Progressing toward goals    Frequency    7X/week  PT Plan Current plan remains appropriate    Co-evaluation              AM-PAC PT "6 Clicks" Mobility   Outcome Measure  Help needed turning from your back to your side while in a flat bed without using bedrails?: A Little Help needed moving from lying on your back to sitting on the side of a flat bed without using bedrails?: A Little Help needed moving to and from a bed to a chair (including a wheelchair)?: A Little Help needed standing up from a chair using your arms (e.g., wheelchair or bedside chair)?: A  Little Help needed to walk in hospital room?: A Little Help needed climbing 3-5 steps with a railing? : A Lot 6 Click Score: 17    End of Session Equipment Utilized During Treatment: Gait belt Activity Tolerance: Patient tolerated treatment well Patient left: in chair;with call bell/phone within reach;with chair alarm set Nurse Communication: Mobility status PT Visit Diagnosis: Muscle weakness (generalized) (M62.81);Difficulty in walking, not elsewhere classified (R26.2);Pain Pain - Right/Left: Right Pain - part of body: Knee     Time: 0825-0850 PT Time Calculation (min) (ACUTE ONLY): 25 min  Charges:  $Therapeutic Exercise: 8-22 mins $Therapeutic Activity: 8-22 mins                     Mauro Kaufmann PT Acute Rehabilitation Services Pager [phone] Office [phone]    Natasha [name] 10/30/2020, 1:25 PM

## 2020-10-30 NOTE — Plan of Care (Signed)
  Problem: Education: Goal: Knowledge of General Education information will improve Description: Including pain rating scale, medication(s)/side effects and non-pharmacologic comfort measures Outcome: Progressing   Problem: Health Behavior/Discharge Planning: Goal: Ability to manage health-related needs will improve Outcome: Progressing   Problem: Activity: Goal: Risk for activity intolerance will decrease Outcome: Progressing   

## 2020-10-30 NOTE — Progress Notes (Signed)
Subjective: 2 Days Post-Op Procedure(s) (LRB): TOTAL KNEE ARTHROPLASTY (Right) Patient reports pain as mild.   Still limited by nausea with PT but improving  Objective: Vital signs in last 24 hours: Temp:  [97.7 F (36.5 C)-99.9 F (37.7 C)] 98.6 F (37 C) (07/15 0558) Pulse Rate:  [71-100] 88 (07/15 0558) Resp:  [16-18] 16 (07/15 0558) BP: (125-150)/(46-71) 150/71 (07/15 0558) SpO2:  [91 %-98 %] 97 % (07/15 0558)  Intake/Output from previous day: 07/14 0701 - 07/15 0700 In: 1283.7 [P.O.:480; I.V.:803.7] Out: -  Intake/Output this shift: No intake/output data recorded.  No results for input(s): HGB in the last 72 hours. No results for input(s): WBC, RBC, HCT, PLT in the last 72 hours. Recent Labs    10/28/20 0640  NA 132*  K 4.0  CL 102  CO2 23  BUN 13  CREATININE 0.69  GLUCOSE 92  CALCIUM 9.0   No results for input(s): LABPT, INR in the last 72 hours.  Neurologically intact Neurovascular intact Sensation intact distally Intact pulses distally Dorsiflexion/Plantar flexion intact Incision: dressing C/D/I Compartment soft   Assessment/Plan: 2 Days Post-Op Procedure(s) (LRB): TOTAL KNEE ARTHROPLASTY (Right) Advance diet Up with therapy, once cleared by PT okay to be discharged Discharge home with home health Meds already sent in, hopeful discharge today DVT: Eliquis   Anticipated LOS equal to or greater than 2 midnights due to - Age 54 and older with one or more of the following:  - Obesity  - Expected need for hospital services (PT, OT, Nursing) required for safe  discharge  - Anticipated need for postoperative skilled nursing care or inpatient rehab  - Active co-morbidities: None OR   - Unanticipated findings during/Post Surgery: Slow post-op progression: GI, pain control, mobility  - Patient is a high risk of re-admission due to: None   Natasha [name] [phone] 10/30/2020, 7:43 AM

## 2020-10-30 NOTE — Progress Notes (Signed)
The patient is alert and oriented and has been seen by her physician. The orders for discharge were written. IV has been removed. Went over discharge instructions with patient and family. She is about to be discharged via wheelchair with all of her belongings.   

## 2020-10-31 DIAGNOSIS — M1990 Unspecified osteoarthritis, unspecified site: Secondary | ICD-10-CM | POA: Diagnosis not present

## 2020-10-31 DIAGNOSIS — I5032 Chronic diastolic (congestive) heart failure: Secondary | ICD-10-CM | POA: Diagnosis not present

## 2020-10-31 DIAGNOSIS — E039 Hypothyroidism, unspecified: Secondary | ICD-10-CM | POA: Diagnosis not present

## 2020-10-31 DIAGNOSIS — R1310 Dysphagia, unspecified: Secondary | ICD-10-CM | POA: Diagnosis not present

## 2020-10-31 DIAGNOSIS — Z471 Aftercare following joint replacement surgery: Secondary | ICD-10-CM | POA: Diagnosis not present

## 2020-10-31 DIAGNOSIS — K219 Gastro-esophageal reflux disease without esophagitis: Secondary | ICD-10-CM | POA: Diagnosis not present

## 2020-10-31 DIAGNOSIS — E669 Obesity, unspecified: Secondary | ICD-10-CM | POA: Diagnosis not present

## 2020-10-31 DIAGNOSIS — I110 Hypertensive heart disease with heart failure: Secondary | ICD-10-CM | POA: Diagnosis not present

## 2020-10-31 DIAGNOSIS — J449 Chronic obstructive pulmonary disease, unspecified: Secondary | ICD-10-CM | POA: Diagnosis not present

## 2020-10-31 DIAGNOSIS — Z6828 Body mass index (BMI) 28.0-28.9, adult: Secondary | ICD-10-CM | POA: Diagnosis not present

## 2020-10-31 DIAGNOSIS — K449 Diaphragmatic hernia without obstruction or gangrene: Secondary | ICD-10-CM | POA: Diagnosis not present

## 2020-10-31 DIAGNOSIS — R5382 Chronic fatigue, unspecified: Secondary | ICD-10-CM | POA: Diagnosis not present

## 2020-10-31 DIAGNOSIS — G709 Myoneural disorder, unspecified: Secondary | ICD-10-CM | POA: Diagnosis not present

## 2020-10-31 DIAGNOSIS — K5790 Diverticulosis of intestine, part unspecified, without perforation or abscess without bleeding: Secondary | ICD-10-CM | POA: Diagnosis not present

## 2020-10-31 DIAGNOSIS — Z96651 Presence of right artificial knee joint: Secondary | ICD-10-CM | POA: Diagnosis not present

## 2020-10-31 DIAGNOSIS — G2581 Restless legs syndrome: Secondary | ICD-10-CM | POA: Diagnosis not present

## 2020-10-31 DIAGNOSIS — I480 Paroxysmal atrial fibrillation: Secondary | ICD-10-CM | POA: Diagnosis not present

## 2020-10-31 DIAGNOSIS — R911 Solitary pulmonary nodule: Secondary | ICD-10-CM | POA: Diagnosis not present

## 2020-10-31 DIAGNOSIS — Z9181 History of falling: Secondary | ICD-10-CM | POA: Diagnosis not present

## 2020-11-02 DIAGNOSIS — I110 Hypertensive heart disease with heart failure: Secondary | ICD-10-CM | POA: Diagnosis not present

## 2020-11-02 DIAGNOSIS — I5032 Chronic diastolic (congestive) heart failure: Secondary | ICD-10-CM | POA: Diagnosis not present

## 2020-11-02 DIAGNOSIS — M1990 Unspecified osteoarthritis, unspecified site: Secondary | ICD-10-CM | POA: Diagnosis not present

## 2020-11-02 DIAGNOSIS — J449 Chronic obstructive pulmonary disease, unspecified: Secondary | ICD-10-CM | POA: Diagnosis not present

## 2020-11-02 DIAGNOSIS — Z96651 Presence of right artificial knee joint: Secondary | ICD-10-CM | POA: Diagnosis not present

## 2020-11-02 DIAGNOSIS — Z471 Aftercare following joint replacement surgery: Secondary | ICD-10-CM | POA: Diagnosis not present

## 2020-11-03 DIAGNOSIS — Z471 Aftercare following joint replacement surgery: Secondary | ICD-10-CM | POA: Diagnosis not present

## 2020-11-03 DIAGNOSIS — M1990 Unspecified osteoarthritis, unspecified site: Secondary | ICD-10-CM | POA: Diagnosis not present

## 2020-11-03 DIAGNOSIS — I110 Hypertensive heart disease with heart failure: Secondary | ICD-10-CM | POA: Diagnosis not present

## 2020-11-03 DIAGNOSIS — J449 Chronic obstructive pulmonary disease, unspecified: Secondary | ICD-10-CM | POA: Diagnosis not present

## 2020-11-03 DIAGNOSIS — I5032 Chronic diastolic (congestive) heart failure: Secondary | ICD-10-CM | POA: Diagnosis not present

## 2020-11-03 DIAGNOSIS — Z96651 Presence of right artificial knee joint: Secondary | ICD-10-CM | POA: Diagnosis not present

## 2020-11-05 DIAGNOSIS — I5032 Chronic diastolic (congestive) heart failure: Secondary | ICD-10-CM | POA: Diagnosis not present

## 2020-11-05 DIAGNOSIS — I110 Hypertensive heart disease with heart failure: Secondary | ICD-10-CM | POA: Diagnosis not present

## 2020-11-05 DIAGNOSIS — J449 Chronic obstructive pulmonary disease, unspecified: Secondary | ICD-10-CM | POA: Diagnosis not present

## 2020-11-05 DIAGNOSIS — M1990 Unspecified osteoarthritis, unspecified site: Secondary | ICD-10-CM | POA: Diagnosis not present

## 2020-11-05 DIAGNOSIS — Z96651 Presence of right artificial knee joint: Secondary | ICD-10-CM | POA: Diagnosis not present

## 2020-11-05 DIAGNOSIS — Z471 Aftercare following joint replacement surgery: Secondary | ICD-10-CM | POA: Diagnosis not present

## 2020-11-06 DIAGNOSIS — Z471 Aftercare following joint replacement surgery: Secondary | ICD-10-CM | POA: Diagnosis not present

## 2020-11-06 DIAGNOSIS — I110 Hypertensive heart disease with heart failure: Secondary | ICD-10-CM | POA: Diagnosis not present

## 2020-11-06 DIAGNOSIS — J449 Chronic obstructive pulmonary disease, unspecified: Secondary | ICD-10-CM | POA: Diagnosis not present

## 2020-11-06 DIAGNOSIS — Z96651 Presence of right artificial knee joint: Secondary | ICD-10-CM | POA: Diagnosis not present

## 2020-11-06 DIAGNOSIS — I5032 Chronic diastolic (congestive) heart failure: Secondary | ICD-10-CM | POA: Diagnosis not present

## 2020-11-06 DIAGNOSIS — M1990 Unspecified osteoarthritis, unspecified site: Secondary | ICD-10-CM | POA: Diagnosis not present

## 2020-11-09 DIAGNOSIS — Z96651 Presence of right artificial knee joint: Secondary | ICD-10-CM | POA: Diagnosis not present

## 2020-11-09 DIAGNOSIS — J449 Chronic obstructive pulmonary disease, unspecified: Secondary | ICD-10-CM | POA: Diagnosis not present

## 2020-11-09 DIAGNOSIS — I5032 Chronic diastolic (congestive) heart failure: Secondary | ICD-10-CM | POA: Diagnosis not present

## 2020-11-09 DIAGNOSIS — M1990 Unspecified osteoarthritis, unspecified site: Secondary | ICD-10-CM | POA: Diagnosis not present

## 2020-11-09 DIAGNOSIS — Z471 Aftercare following joint replacement surgery: Secondary | ICD-10-CM | POA: Diagnosis not present

## 2020-11-09 DIAGNOSIS — I110 Hypertensive heart disease with heart failure: Secondary | ICD-10-CM | POA: Diagnosis not present

## 2020-11-11 DIAGNOSIS — Z4789 Encounter for other orthopedic aftercare: Secondary | ICD-10-CM | POA: Diagnosis not present

## 2020-11-12 ENCOUNTER — Other Ambulatory Visit: Payer: Self-pay | Admitting: Internal Medicine

## 2020-11-12 NOTE — Telephone Encounter (Signed)
Prescription refill request for Eliquis received. Indication: Afib Last office visit: 06/04/20 Natasha [name]) Scr: 0.69 (10/28/20) Age: 87 Weight: 91.2kg    Appropriate dose and refill sent to requested pharmacy.

## 2020-11-18 NOTE — Discharge Summary (Signed)
Physician Discharge Summary  Patient ID: Natasha [name] MRN: [id number] DOB/AGE: [birth year] 87 y.o.  Admit date: 10/28/2020 Discharge date: 11/18/2020  Admission Diagnoses: Right knee osteoarthritis  Discharge Diagnoses:  Active Problems:   Osteoarthritis of right knee   Discharged Condition: good  Hospital Course: Patient was admitted on 7/13 for a right total knee arthroplasty due to end stage osteoarthritis. She tolerated surgery well. She was sent to PACU and postop floor in stable condition. Postop day 1 PT was limited for patient due to nausea. She did slightly better with PT this afternoon. Postop day 2 nausea improved. She was able to get up with PT and ambulate. She was d/c home with OPPT. She will follow up in 2 weeks in the office.   Consults: None  Significant Diagnostic Studies: none  Treatments: IV hydration, antibiotics: vancomycin, analgesia: acetaminophen, Vicodin, and Dilaudid, anticoagulation: Eliquis, therapies: PT, and surgery: Right TKA  Discharge Exam: Blood pressure 126/61, pulse 87, temperature 98.6 F (37 C), temperature source Oral, resp. rate 16, height 5\' 8"  (1.727 m), weight 91.2 kg, SpO2 94 %. General appearance: alert, cooperative, appears stated age, and no distress Extremities: extremities normal, atraumatic, no cyanosis or edema and Homans sign is negative, no sign of DVT Pulses:  L brachial 2+ R brachial 2+  L radial 2+ R radial 2+  L inguinal 2+ R inguinal 2+  L popliteal 2+ R popliteal 2+  L posterior tibial 2+ R posterior tibial 2+  L dorsalis pedis 2+ R dorsalis pedis 2+   Skin: Skin color, texture, turgor normal. No rashes or lesions or normal Neurologic: Alert and oriented X 3, normal strength and tone. Normal symmetric reflexes. Normal coordination and gait Incision/Wound: Dressings clean dry and intact  Disposition: Discharge disposition: 01-Home or Self Care       Discharge Instructions     Ambulatory referral to Home Health    Complete by: As directed    Please evaluate [name] for admission to [facility].  Disciplines requested: Physical Therapy  Services to provide: Strengthening Exercises and Evaluate  Physician to follow patient's care (the person listed here will be responsible for signing ongoing orders): Referring Provider  Requested Start of Care Date: Within 2-3 days  I certify that this patient is under my care and that I, or a Nurse Practitioner or Physician's Assistant working with me, had a face-to-face encounter that meets the physician face-to-face requirements with patient on 10/29/2020. The encounter with the patient was in whole, or in part for the following medical condition(s) which is the primary reason for home health care (List medical condition). Right total knee arthroplasty  Special Instructions:  None   Does the patient have Medicare or Medicaid?: Yes   The encounter with the patient was in whole, or in part, for the following medical condition, which is the primary reason for home health care: Right total knee arthroplasty   Reason for Medically Necessary Home Health Services:  Therapy- Instruction on Safe use of Assistive Devices for ADLs Therapy- Therapeutic Exercises to Increase Strength and Endurance     My clinical findings support the need for the above services: Unable to leave home safely without assistance and/or assistive device   I certify that, based on my findings, the following services are medically necessary home health services: Physical therapy   Further, I certify that my clinical findings support that this patient is homebound due to: Unable to leave home safely without assistance   Call MD /  Call 911   Complete by: As directed    If you experience chest pain or shortness of breath, CALL 911 and be transported to the hospital emergency room.  If you develope a fever above 101 F, pus (white drainage) or increased drainage or redness at the wound, or calf pain,  call your surgeon's office.   Constipation Prevention   Complete by: As directed    Drink plenty of fluids.  Prune juice may be helpful.  You may use a stool softener, such as Colace (over the counter) 100 mg twice a day.  Use MiraLax (over the counter) for constipation as needed.   Diet - low sodium heart healthy   Complete by: As directed    Discharge instructions   Complete by: As directed    Dr. Eugenia Mcalpineobert [facility] [address] [place], [place] [address] [phone]  TOTAL KNEE REPLACEMENT POSTOPERATIVE DIRECTIONS  Knee Rehabilitation, Guidelines Following Surgery  Results after knee surgery are often greatly improved when you follow the exercise, range of motion and muscle strengthening exercises prescribed by your doctor. Safety measures are also important to protect the knee from further injury. Any time any of these exercises cause you to have increased pain or swelling in your knee joint, decrease the amount until you are comfortable again and slowly increase them. If you have problems or questions, call your caregiver or physical therapist for advice.   HOME CARE INSTRUCTIONS  Remove items at home which could result in a fall. This includes throw rugs or furniture in walking pathways.  ICE to the affected knee every three hours for 30 minutes at a time and then as needed for pain and swelling.  Continue to use ice on the knee for pain and swelling from surgery. You may notice swelling that will progress down to the foot and ankle.  This is normal after surgery.  Elevate the leg when you are not up walking on it.   Continue to use the breathing machine which will help keep your temperature down.  It is common for your temperature to cycle up and down following surgery, especially at night when you are not up moving around and exerting yourself.  The breathing machine keeps your lungs expanded and your temperature down. Do not place pillow under knee, focus on  keeping the knee straight while resting   DIET You may resume your previous home diet once your are discharged from the hospital.  DRESSING / WOUND CARE / SHOWERING Keep the surgical dressing until follow up.  The dressing is water proof, but you need to put extra covering over it like plastic wrap.  IF THE DRESSING FALLS OFF or the wound gets wet inside, change the dressing with sterile gauze.  Please use good hand washing techniques before changing the dressing.  Do not use any lotions or creams on the incision until instructed by your surgeon.   You may start showering once you are discharged home but do not submerge the incision under water.  You are sent home with an ACE bandage on over the leg, this can be removed at 3 days from surgery. At this time you can start showering. Please place the white TED stocking on the surgical leg after. This needs to be worn on the surgical leg for 2 weeks after surgery.   ACTIVITY Walk with your walker as instructed. Use walker as long as suggested by your caregivers. Avoid periods of inactivity such as sitting longer  than an hour when not asleep. This helps prevent blood clots.  You may resume a sexual relationship in one month or when given the OK by your doctor.  You may return to work once you are cleared by your doctor.  Do not drive a car for 6 weeks or until released by you surgeon.  Do not drive while taking narcotics.  WEIGHT BEARING Weight bearing as tolerated with assist device (walker, cane, etc) as directed, use it as long as suggested by your surgeon or therapist, typically at least 4-6 weeks.  POSTOPERATIVE CONSTIPATION PROTOCOL Constipation - defined medically as fewer than three stools per week and severe constipation as less than one stool per week.  One of the most common issues patients have following surgery is constipation.  Even if you have a regular bowel pattern at home, your normal regimen is likely to be disrupted due to  multiple reasons following surgery.  Combination of anesthesia, postoperative narcotics, change in appetite and fluid intake all can affect your bowels.  In order to avoid complications following surgery, here are some recommendations in order to help you during your recovery period.  Colace (docusate) - Pick up an over-the-counter form of Colace or another stool softener and take twice a day as long as you are requiring postoperative pain medications.  Take with a full glass of water daily.  If you experience loose stools or diarrhea, hold the colace until you stool forms back up.  If your symptoms do not get better within 1 week or if they get worse, check with your doctor.  Dulcolax (bisacodyl) - Pick up over-the-counter and take as directed by the product packaging as needed to assist with the movement of your bowels.  Take with a full glass of water.  Use this product as needed if not relieved by Colace only.   MiraLax (polyethylene glycol) - Pick up over-the-counter to have on hand.  MiraLax is a solution that will increase the amount of water in your bowels to assist with bowel movements.  Take as directed and can mix with a glass of water, juice, soda, coffee, or tea.  Take if you go more than two days without a movement. Do not use MiraLax more than once per day. Call your doctor if you are still constipated or irregular after using this medication for 7 days in a row.  If you continue to have problems with postoperative constipation, please contact the office for further assistance and recommendations.  If you experience "the worst abdominal pain ever" or develop nausea or vomiting, please contact the office immediatly for further recommendations for treatment.  ITCHING  If you experience itching with your medications, try taking only a single pain pill, or even half a pain pill at a time.  You can also use Benadryl over the counter for itching or also to help with sleep.   TED HOSE  STOCKINGS Wear the elastic stockings on both legs for two weeks following surgery during the day but you may remove then at night for sleeping.  Okay to remove ACE in 3 days, put TED on after  MEDICATIONS See your medication summary on the "After Visit Summary" that the nursing staff will review with you prior to discharge.  You may have some home medications which will be placed on hold until you complete the course of blood thinner medication.  It is important for you to complete the blood thinner medication as prescribed by your surgeon.  Continue your  approved medications as instructed at time of discharge. If you were not previously taking any blood thinners prior to surgery please start taking Aspirin 325 mg tabs twice daily for 6 weeks. If you are unable to take Aspirin please let your doctor know.   PRECAUTIONS If you experience chest pain or shortness of breath - call 911 immediately for transfer to the hospital emergency department.  If you develop a fever greater that 101 F, purulent drainage from wound, increased redness or drainage from wound, foul odor from the wound/dressing, or calf pain - CONTACT YOUR SURGEON.                                                   FOLLOW-UP APPOINTMENTS Make sure you keep all of your appointments after your operation with your surgeon and caregivers. You should call the office at the above phone number and make an appointment for approximately two weeks after the date of your surgery or on the date instructed by your surgeon outlined in the "After Visit Summary".   RANGE OF MOTION AND STRENGTHENING EXERCISES  Rehabilitation of the knee is important following a knee injury or an operation. After just a few days of immobilization, the muscles of the thigh which control the knee become weakened and shrink (atrophy). Knee exercises are designed to build up the tone and strength of the thigh muscles and to improve knee motion. Often times heat used for  twenty to thirty minutes before working out will loosen up your tissues and help with improving the range of motion but do not use heat for the first two weeks following surgery. These exercises can be done on a training (exercise) mat, on the floor, on a table or on a bed. Use what ever works the best and is most comfortable for you Knee exercises include:  Leg Lifts - While your knee is still immobilized in a splint or cast, you can do straight leg raises. Lift the leg to 60 degrees, hold for 3 sec, and slowly lower the leg. Repeat 10-20 times 2-3 times daily. Perform this exercise against resistance later as your knee gets better.  Quad and Hamstring Sets - Tighten up the muscle on the front of the thigh (Quad) and hold for 5-10 sec. Repeat this 10-20 times hourly. Hamstring sets are done by pushing the foot backward against an object and holding for 5-10 sec. Repeat as with quad sets.  Leg Slides: Lying on your back, slowly slide your foot toward your buttocks, bending your knee up off the floor (only go as far as is comfortable). Then slowly slide your foot back down until your leg is flat on the floor again. Angel Wings: Lying on your back spread your legs to the side as far apart as you can without causing discomfort.  A rehabilitation program following serious knee injuries can speed recovery and prevent re-injury in the future due to weakened muscles. Contact your doctor or a physical therapist for more information on knee rehabilitation.   IF YOU ARE TRANSFERRED TO A SKILLED REHAB FACILITY If the patient is transferred to a skilled rehab facility following release from the hospital, a list of the current medications will be sent to the facility for the patient to continue.  When discharged from the skilled rehab facility, please have the facility set up the  patient's Home Health Physical Therapy prior to being released. Also, the skilled facility will be responsible for providing the patient with  their medications at time of release from the facility to include their pain medication, the muscle relaxants, and their blood thinner medication. If the patient is still at the rehab facility at time of the two week follow up appointment, the skilled rehab facility will also need to assist the patient in arranging follow up appointment in our office and any transportation needs.  MAKE SURE YOU:  Understand these instructions.  Get help right away if you are not doing well or get worse.    Pick up stool softner and laxative for home use following surgery while on pain medications. May shower starting three days after surgery. Please use a clean towel to pat the leg dry following showers. Continue to use ice for pain and swelling after surgery. Do not use any lotions or creams on the incision until instructed by you Start Aspirin immediately following surgery.   Do not put a pillow under the knee. Place it under the heel.   Complete by: As directed    Driving restrictions   Complete by: As directed    No driving for two weeks   Post-operative opioid taper instructions:   Complete by: As directed    POST-OPERATIVE OPIOID TAPER INSTRUCTIONS: It is important to wean off of your opioid medication as soon as possible. If you do not need pain medication after your surgery it is ok to stop day one. Opioids include: Codeine, Hydrocodone(Norco, Vicodin), Oxycodone(Percocet, oxycontin) and hydromorphone amongst others.  Long term and even short term use of opiods can cause: Increased pain response Dependence Constipation Depression Respiratory depression And more.  Withdrawal symptoms can include Flu like symptoms Nausea, vomiting And more Techniques to manage these symptoms Hydrate well Eat regular healthy meals Stay active Use relaxation techniques(deep breathing, meditating, yoga) Do Not substitute Alcohol to help with tapering If you have been on opioids for less than two weeks and  do not have pain than it is ok to stop all together.  Plan to wean off of opioids This plan should start within one week post op of your joint replacement. Maintain the same interval or time between taking each dose and first decrease the dose.  Cut the total daily intake of opioids by one tablet each day Next start to increase the time between doses. The last dose that should be eliminated is the evening dose.      TED hose   Complete by: As directed    Use stockings (TED hose) for three weeks on both leg(s).  You may remove them at night for sleeping.   Weight bearing as tolerated   Complete by: As directed       Allergies as of 10/30/2020       Reactions   Codeine Nausea And Vomiting, Nausea Only   Penicillins Hives   Has patient had a PCN reaction causing immediate rash, facial/tongue/throat swelling, SOB or lightheadedness with hypotension: No Has patient had a PCN reaction causing severe rash involving mucus membranes or skin necrosis: No Has patient had a PCN reaction that required hospitalization No Has patient had a PCN reaction occurring within the last 10 years: No If all of the above answers are "NO", then may proceed with Cephalosporin use.   Tetracycline Rash   Tramadol Nausea Only   Digoxin Other (See Comments)   Flushed neck and face   Digoxin And  Related Other (See Comments)   ' Makes my face blood red"   Erythromycin Other (See Comments), Hives   Flushing on face   Other Other (See Comments)   ECG leads cause skin irritation. ECG leads cause skin irritation.   Sulfa Antibiotics Other (See Comments)   PRESERVATIVE OF SOME FOODS- MAKES FLUSHES TO FACE AND NECK   Tetracyclines & Related Other (See Comments)   "Sores on legs"   Diltiazem Hcl Rash   Rash with long acting form   Lyrica [pregabalin] Rash   Sulfites Rash   PRESERVATIVE OF SOME FOODS- MAKES FLUSHES TO FACE AND NECK PRESERVATIVE OF SOME FOODS- MAKES FLUSHES TO FACE AND NECK        Medication  List     STOP taking these medications    nortriptyline 10 MG capsule Commonly known as: PAMELOR       TAKE these medications    acetaminophen 500 MG tablet Commonly known as: TYLENOL Take 1,000 mg by mouth every 8 (eight) hours as needed for moderate pain.   B-12 1000 MCG Tbdp Take 1,000 mcg by mouth daily.   butalbital-acetaminophen-caffeine 50-325-40 MG tablet Commonly known as: FIORICET Take 1 tablet by mouth 2 (two) times daily as needed for headache.   esomeprazole 20 MG capsule Commonly known as: NEXIUM Take 20 mg by mouth every 3 (three) days.   estradiol 0.025 mg/24hr patch Commonly known as: CLIMARA - Dosed in mg/24 hr Place 0.025 mg onto the skin every Sunday.   furosemide 40 MG tablet Commonly known as: LASIX Take 40 mg by mouth daily.   gabapentin 400 MG capsule Commonly known as: NEURONTIN Take 800 mg by mouth daily.   KERASAL FUNGAL NAIL RENEWAL EX Apply 1 application topically at bedtime.   lactobacillus acidophilus & bulgar chewable tablet Chew 1 tablet by mouth daily.   lidocaine 4 % cream Commonly known as: LMX Apply 1 application topically 2 (two) times daily as needed (pain).   LORazepam 1 MG tablet Commonly known as: ATIVAN Take 0.5-1 mg by mouth at bedtime as needed for sleep. For sleep   MAGNESIUM PO Take 400 mg by mouth daily.   methocarbamol 500 MG tablet Commonly known as: Robaxin Take 1 tablet (500 mg total) by mouth 4 (four) times daily.   mupirocin ointment 2 % Commonly known as: BACTROBAN Apply 1 application topically 2 (two) times daily as needed (wound care).   OVER THE COUNTER MEDICATION Apply 1 application topically at bedtime as needed (pain). [employer]raworks pain relief cream   potassium chloride 10 MEQ tablet Commonly known as: KLOR-CON TAKE 1 TABLET BY MOUTH DAILY   pramipexole 0.125 MG tablet Commonly known as: MIRAPEX Take 0.375 mg by mouth at bedtime.   promethazine 12.5 MG tablet Commonly known as:  PHENERGAN Take 1 tablet (12.5 mg total) by mouth every 4 (four) hours as needed for nausea or vomiting.   Riboflavin 400 MG Caps Take 400 mg by mouth daily.   sennosides-docusate sodium 8.6-50 MG tablet Commonly known as: SENOKOT-S Take 1 tablet by mouth daily as needed for constipation.   Synthroid 150 MCG tablet Generic drug: levothyroxine Take 150 mcg by mouth daily before breakfast.   triamcinolone 0.025 % cream Commonly known as: KENALOG Apply 1 application topically 2 (two) times daily.   valsartan 80 MG tablet Commonly known as: DIOVAN Take 40 mg by mouth daily.   verapamil 120 MG CR tablet Commonly known as: CALAN-SR Take 120 mg by mouth daily as needed (Tachycardia).  vitamin D3 50 MCG (2000 UT) Caps Take 2,000 Units by mouth daily.       ASK your doctor about these medications    HYDROcodone-acetaminophen 5-325 MG tablet Commonly known as: NORCO/VICODIN Take 1 tablet by mouth every 4 (four) hours as needed for up to 7 days for moderate pain or severe pain. Ask about: Should I take this medication?               Discharge Care Instructions  (From admission, onward)           Start     Ordered   10/29/20 0000  Weight bearing as tolerated        10/29/20 0655            Follow-up Information     Cherie Dark, PA. Go on 11/11/2020.   Specialty: Orthopedic Surgery Why: You are scheduled for a follow up appointment on 11-11-20 at 1:40 pm. Contact information: [address] [place] [address] [phone]         [facility] Follow up.   Specialty: Home Health Services Why: PT Contact information: [address] [place] [address] [phone]                 Signed: Cherie Dark 11/18/2020, 11:08 AM

## 2020-11-20 ENCOUNTER — Other Ambulatory Visit: Payer: Self-pay

## 2020-11-20 ENCOUNTER — Ambulatory Visit
Admission: RE | Admit: 2020-11-20 | Discharge: 2020-11-20 | Disposition: A | Discharge status: Home / Self Care | Payer: Medicare Other | Source: Ambulatory Visit | Attending: Internal Medicine | Admitting: Internal Medicine

## 2020-11-20 DIAGNOSIS — Z1231 Encounter for screening mammogram for malignant neoplasm of breast: Secondary | ICD-10-CM

## 2020-11-20 DIAGNOSIS — M25561 Pain in right knee: Secondary | ICD-10-CM | POA: Diagnosis not present

## 2020-11-24 DIAGNOSIS — M25561 Pain in right knee: Secondary | ICD-10-CM | POA: Diagnosis not present

## 2020-11-26 DIAGNOSIS — M25561 Pain in right knee: Secondary | ICD-10-CM | POA: Diagnosis not present

## 2020-11-28 ENCOUNTER — Other Ambulatory Visit: Payer: Self-pay | Admitting: Cardiology

## 2020-12-01 DIAGNOSIS — E039 Hypothyroidism, unspecified: Secondary | ICD-10-CM | POA: Diagnosis not present

## 2020-12-01 DIAGNOSIS — M170 Bilateral primary osteoarthritis of knee: Secondary | ICD-10-CM | POA: Diagnosis not present

## 2020-12-01 DIAGNOSIS — E890 Postprocedural hypothyroidism: Secondary | ICD-10-CM | POA: Diagnosis not present

## 2020-12-01 DIAGNOSIS — J449 Chronic obstructive pulmonary disease, unspecified: Secondary | ICD-10-CM | POA: Diagnosis not present

## 2020-12-01 DIAGNOSIS — I4891 Unspecified atrial fibrillation: Secondary | ICD-10-CM | POA: Diagnosis not present

## 2020-12-01 DIAGNOSIS — I480 Paroxysmal atrial fibrillation: Secondary | ICD-10-CM | POA: Diagnosis not present

## 2020-12-01 DIAGNOSIS — I5032 Chronic diastolic (congestive) heart failure: Secondary | ICD-10-CM | POA: Diagnosis not present

## 2020-12-01 DIAGNOSIS — G43009 Migraine without aura, not intractable, without status migrainosus: Secondary | ICD-10-CM | POA: Diagnosis not present

## 2020-12-01 DIAGNOSIS — E038 Other specified hypothyroidism: Secondary | ICD-10-CM | POA: Diagnosis not present

## 2020-12-01 DIAGNOSIS — I119 Hypertensive heart disease without heart failure: Secondary | ICD-10-CM | POA: Diagnosis not present

## 2020-12-01 DIAGNOSIS — K219 Gastro-esophageal reflux disease without esophagitis: Secondary | ICD-10-CM | POA: Diagnosis not present

## 2020-12-01 DIAGNOSIS — I10 Essential (primary) hypertension: Secondary | ICD-10-CM | POA: Diagnosis not present

## 2020-12-02 DIAGNOSIS — M25561 Pain in right knee: Secondary | ICD-10-CM | POA: Diagnosis not present

## 2020-12-04 DIAGNOSIS — M25561 Pain in right knee: Secondary | ICD-10-CM | POA: Diagnosis not present

## 2020-12-07 DIAGNOSIS — Z4789 Encounter for other orthopedic aftercare: Secondary | ICD-10-CM | POA: Diagnosis not present

## 2020-12-07 DIAGNOSIS — Z96651 Presence of right artificial knee joint: Secondary | ICD-10-CM | POA: Diagnosis not present

## 2020-12-17 ENCOUNTER — Encounter: Payer: Self-pay | Admitting: Cardiology

## 2020-12-17 ENCOUNTER — Other Ambulatory Visit: Payer: Self-pay

## 2020-12-17 ENCOUNTER — Ambulatory Visit (INDEPENDENT_AMBULATORY_CARE_PROVIDER_SITE_OTHER): Payer: Medicare Other | Admitting: Cardiology

## 2020-12-17 VITALS — BP 134/70 | HR 73 | Ht 68.0 in | Wt 198.0 lb

## 2020-12-17 DIAGNOSIS — I4819 Other persistent atrial fibrillation: Secondary | ICD-10-CM

## 2020-12-17 DIAGNOSIS — I5032 Chronic diastolic (congestive) heart failure: Secondary | ICD-10-CM | POA: Diagnosis not present

## 2020-12-17 DIAGNOSIS — I10 Essential (primary) hypertension: Secondary | ICD-10-CM

## 2020-12-17 NOTE — Patient Instructions (Signed)

## 2020-12-17 NOTE — Progress Notes (Addendum)
Cardiology Office Note   Date:  12/17/2020   ID:  Natasha [name], DOB 02/26/1933, MRN [id number]  PCP:  Marden Noble, MD  Cardiologist:  Armanda Magic, MD  Electrophysiologist:  None   Chief Complaint:  Atrial fibrillation  History of Present Illness:    Natasha [name] is a 87 y.o. female with a hx of persistent atrial fibrillation with 2 failed ablations at [facility] and multiple antiarrythmic intolerances (Multaq , Rythmol, flecaide, tikosyn, and pt refused amiodarone) , HTN, hypothyroidism, mild pulmonary hypertension ( ) and chronic diastolic dysfunction.     She has been seen at [facility] as well as in our office by Dr. Ladona Ridgel with EP who recommended 3 options-->continue current course and prn bystolic 5 mg, begin amiodarone ( which she does not wish to do)  and AV node ablaltion with PPM. She chose to continue current meds with  verapmil, bystolic and prn bystolic. She was seen back by Dr. Ladona Ridgel and was having fatigue that was felt to possibly be due to her afib meds and it was recommended that her evening dose of Bystolic be stopped but she decided not to stop it.   Ultimately, Amio was stopped due to side effects.    She is here today for followup and is doing well.  She denies any chest pain or pressure, SOB, DOE, PND, orthopnea, LE edema, dizziness, palpitations or syncope. She is compliant with her meds and is tolerating meds with no SE.     Prior CV studies:   The following studies were reviewed today: none  Past Medical History:  Diagnosis Date   CHF (congestive heart failure) (HCC)    pt. denies   Chronic diastolic heart failure (HCC) 04/22/2015   Complication of anesthesia    COPD (chronic obstructive pulmonary disease) (HCC)    Diverticular disease    DJD (degenerative joint disease)    Dysphagia    Dysrhythmia    afib   GERD (gastroesophageal reflux disease)    Hiatal hernia    Hypertension    Hypothyroidism    Lower leg edema    Neuromuscular disorder (HCC)     neuropathy   PAF (paroxysmal atrial fibrillation) (HCC)    s/p ablation x several times now with occasional episodes of PAF.   She has failed sotolol/dofetilide/flecainide/propafenone/Amio and dronedarone either with not controlling her PAF or having side effects   Pneumonia    PONV (postoperative nausea and vomiting)    Restless leg syndrome    Seasonal allergies    Past Surgical History:  Procedure Laterality Date   ABDOMINAL HYSTERECTOMY     APPENDECTOMY     ATRIAL FIBRILLATION ABLATION  09/06/2013   2'14 -[facility]   BALLOON DILATION  03/03/2011   Procedure: BALLOON DILATION;  Surgeon: Charolett Bumpers, MD;  Location: [facility] ENDOSCOPY;  Service: Endoscopy;  Laterality: N/A;   bladder polyps     BREAST EXCISIONAL BIOPSY Left    BREAST SURGERY     left lumpectomy   CARDIOVERSION     x3   CARDIOVERSION N/A 12/30/2014   Procedure: CARDIOVERSION;  Surgeon: Lewayne Bunting, MD;  Location[facility] ENDOSCOPY;  Service: Cardiovascular;  Laterality: N/A;   CARDIOVERSION N/A 04/23/2015   Procedure: CARDIOVERSION;  Surgeon: Thurmon Fair, MD;  Location: [facility] ENDOSCOPY;  Service: Cardiovascular;  Laterality: N/A;   CATARACT EXTRACTION Left    COLONOSCOPY WITH PROPOFOL N/A 09/24/2013   Procedure: COLONOSCOPY WITH PROPOFOL;  Surgeon: Charolett Bumpers, MD;  Location: [facility] ENDOSCOPY;  Service: Endoscopy;  Laterality: N/A;   ESOPHAGOGASTRODUODENOSCOPY  03/03/2011   Procedure: ESOPHAGOGASTRODUODENOSCOPY (EGD);  Surgeon: Charolett Bumpers, MD;  Location: [facility] ENDOSCOPY;  Service: Endoscopy;  Laterality: N/A;   EYE SURGERY     cataracat   RHINOPLASTY     RIGHT HEART CATHETERIZATION N/A 07/11/2014   Procedure: RIGHT HEART CATH;  Surgeon: Laurey Morale, MD;  Location: [facility] CATH LAB;  Service: Cardiovascular;  Laterality: N/A;   ROTATOR CUFF REPAIR     THYROIDECTOMY  1973   TONSILLECTOMY  1942   TOTAL KNEE ARTHROPLASTY Right 10/28/2020   Procedure: TOTAL KNEE ARTHROPLASTY;  Surgeon: Eugenia Mcalpine, MD;   Location: [facility] ORS;  Service: Orthopedics;  Laterality: Right;  with adductor canal   TUBAL LIGATION       No outpatient medications have been marked as taking for the 12/17/20 encounter (Office Visit) with Quintella Reichert, MD.     Allergies:   Codeine, Penicillins, Tetracycline, Tramadol, Digoxin, Digoxin and related, Erythromycin, Other, Sulfa antibiotics, Tetracyclines & related, Diltiazem hcl, Lyrica [pregabalin], and Sulfites   Social History   Tobacco Use   Smoking status: Never    Passive exposure: Past   Smokeless tobacco: Never  Vaping Use   Vaping Use: Never used  Substance Use Topics   Alcohol use: Not Currently    Comment: rare wine   Drug use: No     Family Hx: The patient's family history includes CVA in her father; Cancer in her brother; Heart failure in her father; Hypertension in her sister; Lung cancer in her father; Sudden death in her brother; Transient ischemic attack in her mother. There is no history of Anesthesia problems, Hypotension, Malignant hyperthermia, Pseudochol deficiency, or Heart attack.  ROS:   Please see the history of present illness.     All other systems reviewed and are negative.   Labs/Other Tests and Data Reviewed:    Recent Labs: 04/03/2020: Magnesium 2.1 10/16/2020: Hemoglobin 14.3; Platelets 228 10/28/2020: ALT 15; BUN 13; Creatinine, Ser 0.69; Potassium 4.0; Sodium 132   Recent Lipid Panel No results found for: CHOL, TRIG, HDL, CHOLHDL, LDLCALC, LDLDIRECT  Wt Readings from Last 3 Encounters:  12/17/20 198 lb (89.8 kg)  10/28/20 201 lb 1 oz (91.2 kg)  10/16/20 201 lb (91.2 kg)     Objective:    Vital Signs:  BP 134/70   Pulse 73   Ht 5\' 8"  (1.727 m)   Wt 198 lb (89.8 kg)   SpO2 98%   BMI 30.11 kg/m    GEN: Well nourished, well developed in no acute distress HEENT: Normal NECK: No JVD; No carotid bruits LYMPHATICS: No lymphadenopathy CARDIAC:RRR, no murmurs, rubs, gallops RESPIRATORY:  Clear to auscultation without  rales, wheezing or rhonchi  ABDOMEN: Soft, non-tender, non-distended MUSCULOSKELETAL:  No edema; No deformity  SKIN: Warm and dry NEUROLOGIC:  Alert and oriented x 3 PSYCHIATRIC:  Normal affect   ASSESSMENT & PLAN:    1.  Chronic diastolic CHF -she denies any SOB or LE edema -she appears euvolemic on exam today -Continue prescription drug management with Lasix 40mg  daily> refilled  -SCr 0.69 and K+ 4 in July 2022  2.  Hypertension -Bp is adequately controlled on exam today -Continue prescription drug management with Diovan 40mg  daily with PRN refills  3.  Persistent atrial fibrillation -She has not had any palpitations  -Amio, BB and CCB stopped due to side effects -no bleeding on DOAC -Continue prescription drug management with Eliquis 5mg  BID>refilled  -I have  personally reviewed and interpreted outside labs performed by patient's PCP which showed SCr 0.69 and Hbg 14.3 in July 2022   Medication Adjustments/Labs and Tests Ordered: Current medicines are reviewed at length with the patient today.  Concerns regarding medicines are outlined above.  Tests Ordered: No orders of the defined types were placed in this encounter.  Medication Changes: No orders of the defined types were placed in this encounter.   Disposition:  Follow up 1 year  Signed, Armanda Magic, MD  12/17/2020 10:28 AM    [facility]

## 2020-12-22 ENCOUNTER — Other Ambulatory Visit: Payer: Self-pay | Admitting: Cardiology

## 2021-01-12 DIAGNOSIS — Z23 Encounter for immunization: Secondary | ICD-10-CM | POA: Diagnosis not present

## 2021-01-18 DIAGNOSIS — Z4789 Encounter for other orthopedic aftercare: Secondary | ICD-10-CM | POA: Diagnosis not present

## 2021-01-28 NOTE — Progress Notes (Signed)
PCP:  Marden Noble, MD Primary Cardiologist: Armanda Magic, MD Electrophysiologist: Lewayne Bunting, MD   Natasha [name] is a 87 y.o. female seen today for Lewayne Bunting, MD for routine electrophysiology followup.  Since last being seen in our clinic the patient reports doing doing very well.  she denies chest pain, palpitations, dyspnea, PND, orthopnea, nausea, vomiting, dizziness, syncope, edema, weight gain, or early satiety.  Past Medical History:  Diagnosis Date   CHF (congestive heart failure) (HCC)    pt. denies   Chronic diastolic heart failure (HCC) 04/22/2015   Complication of anesthesia    COPD (chronic obstructive pulmonary disease) (HCC)    Diverticular disease    DJD (degenerative joint disease)    Dysphagia    Dysrhythmia    afib   GERD (gastroesophageal reflux disease)    Hiatal hernia    Hypertension    Hypothyroidism    Lower leg edema    Neuromuscular disorder (HCC)    neuropathy   PAF (paroxysmal atrial fibrillation) (HCC)    s/p ablation x several times now with occasional episodes of PAF.   She has failed sotolol/dofetilide/flecainide/propafenone/Amio and dronedarone either with not controlling her PAF or having side effects   Pneumonia    PONV (postoperative nausea and vomiting)    Restless leg syndrome    Seasonal allergies    Past Surgical History:  Procedure Laterality Date   ABDOMINAL HYSTERECTOMY     APPENDECTOMY     ATRIAL FIBRILLATION ABLATION  09/06/2013   2'14 -[facility]   BALLOON DILATION  03/03/2011   Procedure: BALLOON DILATION;  Surgeon: Charolett Bumpers, MD;  Location: [facility] ENDOSCOPY;  Service: Endoscopy;  Laterality: N/A;   bladder polyps     BREAST EXCISIONAL BIOPSY Left    BREAST SURGERY     left lumpectomy   CARDIOVERSION     x3   CARDIOVERSION N/A 12/30/2014   Procedure: CARDIOVERSION;  Surgeon: Lewayne Bunting, MD;  Location: [facility] ENDOSCOPY;  Service: Cardiovascular;  Laterality: N/A;   CARDIOVERSION N/A 04/23/2015    Procedure: CARDIOVERSION;  Surgeon: Thurmon Fair, MD;  Location: [facility] ENDOSCOPY;  Service: Cardiovascular;  Laterality: N/A;   CATARACT EXTRACTION Left    COLONOSCOPY WITH PROPOFOL N/A 09/24/2013   Procedure: COLONOSCOPY WITH PROPOFOL;  Surgeon: Charolett Bumpers, MD;  Location: [facility] ENDOSCOPY;  Service: Endoscopy;  Laterality: N/A;   ESOPHAGOGASTRODUODENOSCOPY  03/03/2011   Procedure: ESOPHAGOGASTRODUODENOSCOPY (EGD);  Surgeon: Charolett Bumpers, MD;  Location: [facility] ENDOSCOPY;  Service: Endoscopy;  Laterality: N/A;   EYE SURGERY     cataracat   RHINOPLASTY     RIGHT HEART CATHETERIZATION N/A 07/11/2014   Procedure: RIGHT HEART CATH;  Surgeon: Laurey Morale, MD;  Location: [facility] CATH LAB;  Service: Cardiovascular;  Laterality: N/A;   ROTATOR CUFF REPAIR     THYROIDECTOMY  1973   TONSILLECTOMY  1942   TOTAL KNEE ARTHROPLASTY Right 10/28/2020   Procedure: TOTAL KNEE ARTHROPLASTY;  Surgeon: Eugenia Mcalpine, MD;  Location: [facility];  Service: Orthopedics;  Laterality: Right;  with adductor canal   TUBAL LIGATION      Current Outpatient Medications  Medication Sig Dispense Refill   acetaminophen (TYLENOL) 500 MG tablet Take 1,000 mg by mouth every 8 (eight) hours as needed for moderate pain.     apixaban (ELIQUIS) 5 MG TABS tablet TAKE 1 TABLET BY MOUTH 2 TIMES DAILY. 60 tablet 5   butalbital-acetaminophen-caffeine (FIORICET) 50-325-40 MG tablet Take 1 tablet by mouth 2 (two) times daily  as needed for headache.     Dermatological Products, Misc. (KERASAL FUNGAL NAIL RENEWAL EX) Apply 1 application topically at bedtime.     esomeprazole (NEXIUM) 20 MG capsule Take 20 mg by mouth every 3 (three) days.      estradiol (CLIMARA - DOSED IN MG/24 HR) 0.025 mg/24hr patch Place 0.025 mg onto the skin every Sunday.     furosemide (LASIX) 40 MG tablet TAKE 1 AND 1/2 TABLETS BY MOUTH DAILY. 90 tablet 1   LORazepam (ATIVAN) 1 MG tablet Take 0.5-1 mg by mouth at bedtime as needed for sleep. For sleep     MAGNESIUM  PO Take 400 mg by mouth daily.     methocarbamol (ROBAXIN) 500 MG tablet Take 1 tablet (500 mg total) by mouth 4 (four) times daily. 40 tablet 0   Methylcobalamin (B-12) 1000 MCG TBDP Take 1,000 mcg by mouth daily.     mupirocin ointment (BACTROBAN) 2 % Apply 1 application topically 2 (two) times daily as needed (wound care).     OVER THE COUNTER MEDICATION Apply 1 application topically at bedtime as needed (pain). [employer] pain relief cream     potassium chloride (KLOR-CON) 10 MEQ tablet TAKE 1 TABLET BY MOUTH DAILY 90 tablet 3   pramipexole (MIRAPEX) 0.125 MG tablet Take 0.375 mg by mouth at bedtime.     promethazine (PHENERGAN) 12.5 MG tablet Take 1 tablet (12.5 mg total) by mouth every 4 (four) hours as needed for nausea or vomiting. 60 tablet 0   Riboflavin 400 MG CAPS Take 400 mg by mouth daily.     sennosides-docusate sodium (SENOKOT-S) 8.6-50 MG tablet Take 1 tablet by mouth daily as needed for constipation.     SYNTHROID 150 MCG tablet Take 150 mcg by mouth daily before breakfast.     triamcinolone (KENALOG) 0.025 % cream Apply 1 application topically 2 (two) times daily.     valsartan (DIOVAN) 80 MG tablet Take 40 mg by mouth daily.     verapamil (CALAN-SR) 120 MG CR tablet Take 120 mg by mouth daily as needed (Tachycardia).     Vitamin D, Cholecalciferol, 50 MCG (2000 UT) CAPS Take 2,000 Units by mouth daily.     No current facility-administered medications for this visit.    Allergies  Allergen Reactions   Codeine Nausea And Vomiting and Nausea Only   Penicillins Hives    Has patient had a PCN reaction causing immediate rash, facial/tongue/throat swelling, SOB or lightheadedness with hypotension: No Has patient had a PCN reaction causing severe rash involving mucus membranes or skin necrosis: No Has patient had a PCN reaction that required hospitalization No Has patient had a PCN reaction occurring within the last 10 years: No If all of the above answers are "NO", then may  proceed with Cephalosporin use.   Tetracycline Rash   Tramadol Nausea Only   Digoxin Other (See Comments)    Flushed neck and face   Digoxin And Related Other (See Comments)    ' Makes my face blood red"   Erythromycin Other (See Comments) and Hives    Flushing on face   Other Other (See Comments)    ECG leads cause skin irritation. ECG leads cause skin irritation.   Sulfa Antibiotics Other (See Comments)    PRESERVATIVE OF SOME FOODS- MAKES FLUSHES TO FACE AND NECK   Tetracyclines & Related Other (See Comments)    "Sores on legs"   Diltiazem Hcl Rash    Rash with long acting form  Lyrica [Pregabalin] Rash   Sulfites Rash    PRESERVATIVE OF SOME FOODS- MAKES FLUSHES TO FACE AND NECK PRESERVATIVE OF SOME FOODS- MAKES FLUSHES TO FACE AND NECK    Social History   Socioeconomic History   Marital status: Married    Spouse name: Not on file   Number of children: Not on file   Years of education: Not on file   Highest education level: Not on file  Occupational History   Occupation: retired  Tobacco Use   Smoking status: Never    Passive exposure: Past   Smokeless tobacco: Never  Vaping Use   Vaping Use: Never used  Substance and Sexual Activity   Alcohol use: Not Currently    Comment: rare wine   Drug use: No   Sexual activity: Not Currently    Birth control/protection: Post-menopausal  Other Topics Concern   Not on file  Social History Narrative   Right handed   Two story home   Drinks caffeine   Social Determinants of Health   Financial Resource Strain: Not on file  Food Insecurity: Not on file  Transportation Needs: Not on file  Physical Activity: Not on file  Stress: Not on file  Social Connections: Not on file  Intimate Partner Violence: Not on file     Review of Systems: All other systems reviewed and are otherwise negative except as noted above.  Physical Exam: Vitals:   01/29/21 1201  BP: 120/68  Pulse: 81  SpO2: 98%  Weight: 196 lb 6.4 oz  (89.1 kg)  Height: 5\' 8"  (1.727 m)    GEN- The patient is well appearing, alert and oriented x 3 today.   HEENT: normocephalic, atraumatic; sclera clear, conjunctiva pink; hearing intact; oropharynx clear; neck supple, no JVP Lymph- no cervical lymphadenopathy Lungs- Clear to ausculation bilaterally, normal work of breathing.  No wheezes, rales, rhonchi Heart- Regular rate and rhythm, no murmurs, rubs or gallops, PMI not laterally displaced GI- soft, non-tender, non-distended, bowel sounds present, no hepatosplenomegaly Extremities- no clubbing, cyanosis, or edema; DP/PT/radial pulses 2+ bilaterally MS- no significant deformity or atrophy Skin- warm and dry, no rash or lesion Psych- euthymic mood, full affect Neuro- strength and sensation are intact  EKG is ordered. Personal review of EKG from today shows NSR at 81 bpm, mild 1st degree AV block  Additional studies reviewed include: Previous EP office notes.   Assessment and Plan:  1. Paroxysmal atrial fibrillation EKG today shows NSR. Asymptomatic. Continue eliquis for CHA2DS2/VASc of at least 5 Continue verapamil Has apple watch to check with symptoms Labs stable July. Has upcoming PCP visit.   2. HTN Stable on current regimen   3. Chronic diastolic CHF Volume status stable with NYHA II symptoms in NSR, III in AF.   Follow up with Dr. August in 6 months   Ladona Ridgel, PA-C  01/29/21 12:48 PM

## 2021-01-29 ENCOUNTER — Other Ambulatory Visit: Payer: Self-pay

## 2021-01-29 ENCOUNTER — Ambulatory Visit (INDEPENDENT_AMBULATORY_CARE_PROVIDER_SITE_OTHER): Payer: Medicare Other | Admitting: Student

## 2021-01-29 ENCOUNTER — Encounter: Payer: Self-pay | Admitting: Student

## 2021-01-29 VITALS — BP 120/68 | HR 81 | Ht 68.0 in | Wt 196.4 lb

## 2021-01-29 DIAGNOSIS — I480 Paroxysmal atrial fibrillation: Secondary | ICD-10-CM

## 2021-01-29 DIAGNOSIS — I10 Essential (primary) hypertension: Secondary | ICD-10-CM | POA: Diagnosis not present

## 2021-01-29 DIAGNOSIS — I5032 Chronic diastolic (congestive) heart failure: Secondary | ICD-10-CM | POA: Diagnosis not present

## 2021-01-29 NOTE — Patient Instructions (Signed)
Medication Instructions:  Your physician recommends that you continue on your current medications as directed. Please refer to the Current Medication list given to you today.  *If you need a refill on your cardiac medications before your next appointment, please call your pharmacy*   Lab Work: None If you have labs (blood work) drawn today and your tests are completely normal, you will receive your results only by: MyChart Message (if you have MyChart) OR A paper copy in the mail If you have any lab test that is abnormal or we need to change your treatment, we will call you to review the results.   Follow-Up: At [facility], you and your health needs are our priority.  As part of our continuing mission to provide you with exceptional heart care, we have created designated Provider Care Teams.  These Care Teams include your primary Cardiologist (physician) and Advanced Practice Providers (APPs -  Physician Assistants and Nurse Practitioners) who all work together to provide you with the care you need, when you need it.   Your next appointment:   6 month(s)  The format for your next appointment:   In Person  Provider:   You may see Gregg Taylor, MD or one of the following Advanced Practice Providers on your designated Care Team:   Renee Ursuy, PA-C Michael "Andy" Tillery, PA-C    

## 2021-03-02 DIAGNOSIS — M170 Bilateral primary osteoarthritis of knee: Secondary | ICD-10-CM | POA: Diagnosis not present

## 2021-03-02 DIAGNOSIS — I480 Paroxysmal atrial fibrillation: Secondary | ICD-10-CM | POA: Diagnosis not present

## 2021-03-02 DIAGNOSIS — E890 Postprocedural hypothyroidism: Secondary | ICD-10-CM | POA: Diagnosis not present

## 2021-03-02 DIAGNOSIS — E039 Hypothyroidism, unspecified: Secondary | ICD-10-CM | POA: Diagnosis not present

## 2021-03-02 DIAGNOSIS — I10 Essential (primary) hypertension: Secondary | ICD-10-CM | POA: Diagnosis not present

## 2021-03-02 DIAGNOSIS — E038 Other specified hypothyroidism: Secondary | ICD-10-CM | POA: Diagnosis not present

## 2021-03-02 DIAGNOSIS — K219 Gastro-esophageal reflux disease without esophagitis: Secondary | ICD-10-CM | POA: Diagnosis not present

## 2021-03-02 DIAGNOSIS — J449 Chronic obstructive pulmonary disease, unspecified: Secondary | ICD-10-CM | POA: Diagnosis not present

## 2021-03-02 DIAGNOSIS — I5032 Chronic diastolic (congestive) heart failure: Secondary | ICD-10-CM | POA: Diagnosis not present

## 2021-03-22 DIAGNOSIS — Z4789 Encounter for other orthopedic aftercare: Secondary | ICD-10-CM | POA: Diagnosis not present

## 2021-03-29 ENCOUNTER — Ambulatory Visit: Payer: Medicare Other | Admitting: Neurology

## 2021-04-04 DIAGNOSIS — Z23 Encounter for immunization: Secondary | ICD-10-CM | POA: Diagnosis not present

## 2021-04-26 NOTE — Progress Notes (Signed)
NEUROLOGY FOLLOW UP OFFICE NOTE  Natasha [name] [id number]  Assessment/Plan:   Peripheral neuropathy Lumbar spondylosis Osteoarthritis in knees Restless leg syndrome While she has symptoms of restless leg syndrome, I believe that her problem is mainly pain rather than restless leg syndrome, pain related to neuropathy, lumbar radiculitis, degenerative disease of the lumbar spine and arthritis of her knees.    For treatment of neuropathic pain, I recommended reconsidering nortriptyline.  She has already failed gabapentin and Lyrica.  I wouldn't use Cymbalta as serotonin reuptake inhibitors may aggravate restless leg.  I have checked her ECG in October which showed normal QT interval.  Nortriptyline may be helpful for the RLS as well.  Will start 10mg  at bedtime.  If tolerating and no improvement in 4 weeks, we can increase dose to 20mg  at bedtime. Follow up in 6 months.   Subjective:  Natasha [name] is an 88 year old white female with HTN, PAF, chronic diastolic heart failure, DJD, hypothyroidism, and restless leg syndrome who follows up for neuropathy and restless leg syndrome.  She is accompanied by her husband who supplements history.   UPDATE: Current medications:  pramipexole 0.375mg  2-3 hours prior to bedtime 6-7 (second dose earlier in evening/late afternoon ineffective)., lorazepam 0.5-1mg  QHS PRN (to help with sleep)    Discontinued gabapentin due to rash.  Numbness in fingers resolved.  Still with pain and numbness in feet and legs.  Recent right knee surgery.     HISTORY:  She has had a very gradual onset of lower extremity weakness beginning around 2016 or 2017.  She feels wobbly on her feet.  Over the past year, she no longer is able to walk up steps with her two feet.  She needs to pull herself, either grabbing the bannister or bending over and climbing up the steps on all fours.  Her legs ache.  She was evaluated by pain management for low back and left hip pain.  She has an  MRI of the lumbar spine without contrast on 05/02/2018, which was personally reviewed, and showed multilevel lumbar spondylosis and degenerative endplate marrow edema at L1-L2 with left lateral disc bulge and spurring and mild left L1 foraminal stenosis as well as edema in the medial left psoas muscle at the L1-L2 level.  She was told surgery wasn't an option.       She presented to the ED on 04/07/2019 following a fall in which she struck the back of her head, sustaining a contusion.  CT of head and cervical spine personally reviewed showed no evidence of trauma or other acute abnormality.  No associated dizziness or passing out.  She also reports numbness and tingling involving her fingers and toes.  She feels shooting pain in the right ring finger and left middle finger.  She reports spurs in her neck but denies any significant neck pain.  No weakness in the upper extremities.  She has restless leg syndrome which has gotten worse over the past several months.  It feels like that her legs are jumping.  She needs to keep moving her legs for relief.  She is starting to notice it earlier in the evening while sitting and watching TV.  She was started on pramipexole 0.125mg  2 to 3 hours before bedtime which initially was helpful but then needed to increase to 0.25mg .  Labs from February 2021 showed B12 346, folate 12.7, TSH 1.09, CK 54, aldolase 5.1, ferritin 65.  SPEP/IFE from 08/01/2019 showed polyclonal increase in  IgA but no M-spike.  NCV-EMG of lower extremities on 07/24/2019 demonstrated a chronic sensorimotor axonal polyneuropathy.   Past medications:  Gabapentin (rash), Lyrica (rash), nortriptyline (only tried 10mg  for less than a month - ineffective and concerned about potential side effects on elderly)  PAST MEDICAL HISTORY: Past Medical History:  Diagnosis Date   CHF (congestive heart failure) (HCC)    pt. denies   Chronic diastolic heart failure ([place]) [id number]   Complication of anesthesia    COPD  (chronic obstructive pulmonary disease) (HCC)    Diverticular disease    DJD (degenerative joint disease)    Dysphagia    Dysrhythmia    afib   GERD (gastroesophageal reflux disease)    Hiatal hernia    Hypertension    Hypothyroidism    Lower leg edema    Neuromuscular disorder (HCC)    neuropathy   PAF (paroxysmal atrial fibrillation) (HCC)    s/p ablation x several times now with occasional episodes of PAF.   She has failed sotolol/dofetilide/flecainide/propafenone/Amio and dronedarone either with not controlling her PAF or having side effects   Pneumonia    PONV (postoperative nausea and vomiting)    Restless leg syndrome    Seasonal allergies     MEDICATIONS: Current Outpatient Medications on File Prior to Visit  Medication Sig Dispense Refill   acetaminophen (TYLENOL) 500 MG tablet Take 1,000 mg by mouth every 8 (eight) hours as needed for moderate pain.     apixaban (ELIQUIS) 5 MG TABS tablet TAKE 1 TABLET BY MOUTH 2 TIMES DAILY. 60 tablet 5   butalbital-acetaminophen-caffeine (FIORICET) 50-325-40 MG tablet Take 1 tablet by mouth 2 (two) times daily as needed for headache.     Dermatological Products, Misc. (KERASAL FUNGAL NAIL RENEWAL EX) Apply 1 application topically at bedtime.     esomeprazole (NEXIUM) 20 MG capsule Take 20 mg by mouth every 3 (three) days.      estradiol (CLIMARA - DOSED IN MG/24 HR) 0.025 mg/24hr patch Place 0.025 mg onto the skin every Sunday.     furosemide (LASIX) 40 MG tablet TAKE 1 AND 1/2 TABLETS BY MOUTH DAILY. 90 tablet 1   LORazepam (ATIVAN) 1 MG tablet Take 0.5-1 mg by mouth at bedtime as needed for sleep. For sleep     MAGNESIUM PO Take 400 mg by mouth daily.     methocarbamol (ROBAXIN) 500 MG tablet Take 1 tablet (500 mg total) by mouth 4 (four) times daily. 40 tablet 0   Methylcobalamin (B-12) 1000 MCG TBDP Take 1,000 mcg by mouth daily.     mupirocin ointment (BACTROBAN) 2 % Apply 1 application topically 2 (two) times daily as needed  (wound care).     OVER THE COUNTER MEDICATION Apply 1 application topically at bedtime as needed (pain). [employer] pain relief cream     potassium chloride (KLOR-CON) 10 MEQ tablet TAKE 1 TABLET BY MOUTH DAILY 90 tablet 3   pramipexole (MIRAPEX) 0.125 MG tablet Take 0.375 mg by mouth at bedtime.     promethazine (PHENERGAN) 12.5 MG tablet Take 1 tablet (12.5 mg total) by mouth every 4 (four) hours as needed for nausea or vomiting. 60 tablet 0   Riboflavin 400 MG CAPS Take 400 mg by mouth daily.     sennosides-docusate sodium (SENOKOT-S) 8.6-50 MG tablet Take 1 tablet by mouth daily as needed for constipation.     SYNTHROID 150 MCG tablet Take 150 mcg by mouth daily before breakfast.     triamcinolone (KENALOG) 0.025 %  cream Apply 1 application topically 2 (two) times daily.     valsartan (DIOVAN) 80 MG tablet Take 40 mg by mouth daily.     verapamil (CALAN-SR) 120 MG CR tablet Take 120 mg by mouth daily as needed (Tachycardia).     Vitamin D, Cholecalciferol, 50 MCG (2000 UT) CAPS Take 2,000 Units by mouth daily.     No current facility-administered medications on file prior to visit.    ALLERGIES: Allergies  Allergen Reactions   Codeine Nausea And Vomiting and Nausea Only   Penicillins Hives    Has patient had a PCN reaction causing immediate rash, facial/tongue/throat swelling, SOB or lightheadedness with hypotension: No Has patient had a PCN reaction causing severe rash involving mucus membranes or skin necrosis: No Has patient had a PCN reaction that required hospitalization No Has patient had a PCN reaction occurring within the last 10 years: No If all of the above answers are "NO", then may proceed with Cephalosporin use.   Tetracycline Rash   Tramadol Nausea Only   Digoxin Other (See Comments)    Flushed neck and face   Digoxin And Related Other (See Comments)    ' Makes my face blood red"   Erythromycin Other (See Comments) and Hives    Flushing on face   Other Other (See  Comments)    ECG leads cause skin irritation. ECG leads cause skin irritation.   Sulfa Antibiotics Other (See Comments)    PRESERVATIVE OF SOME FOODS- MAKES FLUSHES TO FACE AND NECK   Tetracyclines & Related Other (See Comments)    "Sores on legs"   Diltiazem Hcl Rash    Rash with long acting form   Lyrica [Pregabalin] Rash   Sulfites Rash    PRESERVATIVE OF SOME FOODS- MAKES FLUSHES TO FACE AND NECK PRESERVATIVE OF SOME FOODS- MAKES FLUSHES TO FACE AND NECK    FAMILY HISTORY: Family History  Problem Relation Age of Onset   Transient ischemic attack Mother    Heart failure Father    CVA Father    Lung cancer Father    Hypertension Sister    Cancer Brother    Sudden death Brother    Anesthesia problems Neg Hx    Hypotension Neg Hx    Malignant hyperthermia Neg Hx    Pseudochol deficiency Neg Hx    Heart attack Neg Hx       Objective:  Blood pressure 120/64, pulse 87, height 5\' 6"  (1.676 m), weight 195 lb (88.5 kg), SpO2 97 %. General: No acute distress.  Patient appears well-groomed.   Head:  Normocephalic/atraumatic Eyes:  Fundi examined but not visualized Neck: supple, no paraspinal tenderness, full range of motion Heart:  Regular rate and rhythm Lungs:  Clear to auscultation bilaterally Back: No paraspinal tenderness Neurological Exam: alert and oriented to person, place, and time.  Speech fluent and not dysarthric, language intact.  CN II-XII intact. Bulk and tone normal, muscle strength 5/5 throughout.  Sensation to pinprick mildly reduced in feet; vibratory sensation reduced in feet.  Deep tendon reflexes 2+ throughout, toes downgoing.  Finger to nose and heel to shin testing intact.  Cautious wide based gait.     Metta Clines, DO  CC: Josetta Huddle, MD

## 2021-04-27 ENCOUNTER — Ambulatory Visit (INDEPENDENT_AMBULATORY_CARE_PROVIDER_SITE_OTHER): Payer: Medicare Other | Admitting: Neurology

## 2021-04-27 ENCOUNTER — Other Ambulatory Visit: Payer: Self-pay

## 2021-04-27 ENCOUNTER — Encounter: Payer: Self-pay | Admitting: Neurology

## 2021-04-27 VITALS — BP 120/64 | HR 87 | Ht 66.0 in | Wt 195.0 lb

## 2021-04-27 DIAGNOSIS — G2581 Restless legs syndrome: Secondary | ICD-10-CM | POA: Diagnosis not present

## 2021-04-27 DIAGNOSIS — G629 Polyneuropathy, unspecified: Secondary | ICD-10-CM

## 2021-04-27 DIAGNOSIS — M47816 Spondylosis without myelopathy or radiculopathy, lumbar region: Secondary | ICD-10-CM | POA: Diagnosis not present

## 2021-04-27 MED ORDER — NORTRIPTYLINE HCL 10 MG PO CAPS: 10.0000 mg | ORAL_CAPSULE | Freq: Every day | ORAL | 5 refills | Status: DC

## 2021-04-27 NOTE — Patient Instructions (Signed)
Restart nortriptyline 10mg  at bedtime.  If no improvement in neuropathy pain in 4 weeks, contact me Otherwise follow up 6 months.

## 2021-05-11 ENCOUNTER — Encounter: Payer: Self-pay | Admitting: Neurology

## 2021-05-18 ENCOUNTER — Encounter: Payer: Self-pay | Admitting: Neurology

## 2021-05-18 ENCOUNTER — Other Ambulatory Visit: Payer: Self-pay | Admitting: Internal Medicine

## 2021-05-18 NOTE — Telephone Encounter (Signed)
Pt last saw Otilio Saber, [place] on 01/29/21, last labs 10/28/20 Creat 0.69, age 88, weight 88.5kg, based on specified criteria pt is on appropriate dosage of Eliquis 5mg  BID for afib.  Will refill rx.

## 2021-07-08 DIAGNOSIS — H04123 Dry eye syndrome of bilateral lacrimal glands: Secondary | ICD-10-CM | POA: Diagnosis not present

## 2021-07-08 DIAGNOSIS — Z961 Presence of intraocular lens: Secondary | ICD-10-CM | POA: Diagnosis not present

## 2021-07-09 ENCOUNTER — Other Ambulatory Visit: Payer: Self-pay | Admitting: Internal Medicine

## 2021-07-09 DIAGNOSIS — E038 Other specified hypothyroidism: Secondary | ICD-10-CM | POA: Diagnosis not present

## 2021-07-09 DIAGNOSIS — I480 Paroxysmal atrial fibrillation: Secondary | ICD-10-CM | POA: Diagnosis not present

## 2021-07-09 DIAGNOSIS — K219 Gastro-esophageal reflux disease without esophagitis: Secondary | ICD-10-CM | POA: Diagnosis not present

## 2021-07-09 DIAGNOSIS — I10 Essential (primary) hypertension: Secondary | ICD-10-CM | POA: Diagnosis not present

## 2021-08-23 DIAGNOSIS — I4891 Unspecified atrial fibrillation: Secondary | ICD-10-CM | POA: Diagnosis not present

## 2021-08-23 DIAGNOSIS — G2581 Restless legs syndrome: Secondary | ICD-10-CM | POA: Diagnosis not present

## 2021-08-23 DIAGNOSIS — G629 Polyneuropathy, unspecified: Secondary | ICD-10-CM | POA: Diagnosis not present

## 2021-08-23 DIAGNOSIS — Z780 Asymptomatic menopausal state: Secondary | ICD-10-CM | POA: Diagnosis not present

## 2021-08-23 DIAGNOSIS — Z0000 Encounter for general adult medical examination without abnormal findings: Secondary | ICD-10-CM | POA: Diagnosis not present

## 2021-08-23 DIAGNOSIS — Z1389 Encounter for screening for other disorder: Secondary | ICD-10-CM | POA: Diagnosis not present

## 2021-08-23 DIAGNOSIS — E039 Hypothyroidism, unspecified: Secondary | ICD-10-CM | POA: Diagnosis not present

## 2021-08-23 DIAGNOSIS — Z7901 Long term (current) use of anticoagulants: Secondary | ICD-10-CM | POA: Diagnosis not present

## 2021-08-23 DIAGNOSIS — J449 Chronic obstructive pulmonary disease, unspecified: Secondary | ICD-10-CM | POA: Diagnosis not present

## 2021-08-23 DIAGNOSIS — I5032 Chronic diastolic (congestive) heart failure: Secondary | ICD-10-CM | POA: Diagnosis not present

## 2021-08-23 DIAGNOSIS — R350 Frequency of micturition: Secondary | ICD-10-CM | POA: Diagnosis not present

## 2021-09-24 DIAGNOSIS — M85851 Other specified disorders of bone density and structure, right thigh: Secondary | ICD-10-CM | POA: Diagnosis not present

## 2021-09-24 DIAGNOSIS — Z780 Asymptomatic menopausal state: Secondary | ICD-10-CM | POA: Diagnosis not present

## 2021-09-28 DIAGNOSIS — J449 Chronic obstructive pulmonary disease, unspecified: Secondary | ICD-10-CM | POA: Diagnosis not present

## 2021-09-28 DIAGNOSIS — I10 Essential (primary) hypertension: Secondary | ICD-10-CM | POA: Diagnosis not present

## 2021-09-28 DIAGNOSIS — I5032 Chronic diastolic (congestive) heart failure: Secondary | ICD-10-CM | POA: Diagnosis not present

## 2021-09-28 DIAGNOSIS — K219 Gastro-esophageal reflux disease without esophagitis: Secondary | ICD-10-CM | POA: Diagnosis not present

## 2021-09-28 DIAGNOSIS — E039 Hypothyroidism, unspecified: Secondary | ICD-10-CM | POA: Diagnosis not present

## 2021-09-28 DIAGNOSIS — M170 Bilateral primary osteoarthritis of knee: Secondary | ICD-10-CM | POA: Diagnosis not present

## 2021-09-28 DIAGNOSIS — I4891 Unspecified atrial fibrillation: Secondary | ICD-10-CM | POA: Diagnosis not present

## 2021-10-04 DIAGNOSIS — E039 Hypothyroidism, unspecified: Secondary | ICD-10-CM | POA: Diagnosis not present

## 2021-10-04 DIAGNOSIS — E559 Vitamin D deficiency, unspecified: Secondary | ICD-10-CM | POA: Diagnosis not present

## 2021-10-07 ENCOUNTER — Other Ambulatory Visit: Payer: Self-pay | Admitting: Family Medicine

## 2021-10-07 ENCOUNTER — Other Ambulatory Visit: Payer: Self-pay | Admitting: Vascular Surgery

## 2021-10-07 ENCOUNTER — Other Ambulatory Visit: Payer: Self-pay | Admitting: Internal Medicine

## 2021-10-07 DIAGNOSIS — Z1231 Encounter for screening mammogram for malignant neoplasm of breast: Secondary | ICD-10-CM

## 2021-11-01 NOTE — Progress Notes (Unsigned)
NEUROLOGY FOLLOW UP OFFICE NOTE  Natasha [name] [id number]  Assessment/Plan:   Peripheral neuropathy Lumbar spondylosis Osteoarthritis in knees Restless leg syndrome     For treatment of neuropathic pain, I recommended reconsidering nortriptyline.  She has already failed gabapentin and Lyrica.  I wouldn't use Cymbalta as serotonin reuptake inhibitors may aggravate restless leg.  I have checked her ECG in October which showed normal QT interval.  Nortriptyline may be helpful for the RLS as well.  Will start 10mg  at bedtime.  If tolerating and no improvement in 4 weeks, we can increase dose to 20mg  at bedtime. Follow up in 6 months.   Subjective:  Natasha [name] is an 88 year old white female with HTN, PAF, chronic diastolic heart failure, DJD, hypothyroidism, and restless leg syndrome who follows up for neuropathy and restless leg syndrome.  She is accompanied by her husband who supplements history.   UPDATE: Current medications:  pramipexole 0.375mg  2-3 hours prior to bedtime 6-7 (second dose earlier in evening/late afternoon ineffective)., lorazepam 0.5-1mg  QHS PRN (to help with sleep)    Started nortriptyline 10mg  for neuralgia.  However, it caused elevated blood pressure.  She also started losing hair, which she attributes to the pramipexole.  ***   HISTORY:  She has had a very gradual onset of lower extremity weakness beginning around 2016 or 2017.  She feels wobbly on her feet.  Over the past year, she no longer is able to walk up steps with her two feet.  She needs to pull herself, either grabbing the bannister or bending over and climbing up the steps on all fours.  Her legs ache.  She was evaluated by pain management for low back and left hip pain.  She has an MRI of the lumbar spine without contrast on 05/02/2018, which was personally reviewed, and showed multilevel lumbar spondylosis and degenerative endplate marrow edema at L1-L2 with left lateral disc bulge and spurring and mild  left L1 foraminal stenosis as well as edema in the medial left psoas muscle at the L1-L2 level.  She was told surgery wasn't an option.       She presented to the ED on 04/07/2019 following a fall in which she struck the back of her head, sustaining a contusion.  CT of head and cervical spine personally reviewed showed no evidence of trauma or other acute abnormality.  No associated dizziness or passing out.  She also reports numbness and tingling involving her fingers and toes.  She feels shooting pain in the right ring finger and left middle finger.  She reports spurs in her neck but denies any significant neck pain.  No weakness in the upper extremities.  She has restless leg syndrome which has gotten worse over the past several months.  It feels like that her legs are jumping.  She needs to keep moving her legs for relief.  She is starting to notice it earlier in the evening while sitting and watching TV.  She was started on pramipexole 0.125mg  2 to 3 hours before bedtime which initially was helpful but then needed to increase to 0.25mg .  Labs from February 2021 showed B12 346, folate 12.7, TSH 1.09, CK 54, aldolase 5.1, ferritin 65.  SPEP/IFE from 08/01/2019 showed polyclonal increase in IgA but no M-spike.  NCV-EMG of lower extremities on 07/24/2019 demonstrated a chronic sensorimotor axonal polyneuropathy.   Past medications:  Gabapentin (rash), Lyrica (rash), nortriptyline (only tried 10mg  for less than a month - ineffective and  concerned about potential side effects on elderly)    PAST MEDICAL HISTORY: Past Medical History:  Diagnosis Date   CHF (congestive heart failure) (HCC)    pt. denies   Chronic diastolic heart failure (HCC) 04/22/2015   Complication of anesthesia    COPD (chronic obstructive pulmonary disease) (HCC)    Diverticular disease    DJD (degenerative joint disease)    Dysphagia    Dysrhythmia    afib   GERD (gastroesophageal reflux disease)    Hiatal hernia     Hypertension    Hypothyroidism    Lower leg edema    Neuromuscular disorder (HCC)    neuropathy   PAF (paroxysmal atrial fibrillation) (HCC)    s/p ablation x several times now with occasional episodes of PAF.   She has failed sotolol/dofetilide/flecainide/propafenone/Amio and dronedarone either with not controlling her PAF or having side effects   Pneumonia    PONV (postoperative nausea and vomiting)    Restless leg syndrome    Seasonal allergies     MEDICATIONS: Current Outpatient Medications on File Prior to Visit  Medication Sig Dispense Refill   acetaminophen (TYLENOL) 500 MG tablet Take 1,000 mg by mouth every 8 (eight) hours as needed for moderate pain.     butalbital-acetaminophen-caffeine (FIORICET) 50-325-40 MG tablet Take 1 tablet by mouth 2 (two) times daily as needed for headache.     Dermatological Products, Misc. (KERASAL FUNGAL NAIL RENEWAL EX) Apply 1 application topically at bedtime.     ELIQUIS 5 MG TABS tablet TAKE 1 TABLET BY MOUTH 2 TIMES DAILY. 60 tablet 5   esomeprazole (NEXIUM) 20 MG capsule Take 20 mg by mouth every 3 (three) days.      estradiol (CLIMARA - DOSED IN MG/24 HR) 0.025 mg/24hr patch Place 0.025 mg onto the skin every Sunday.     furosemide (LASIX) 40 MG tablet TAKE 1 AND 1/2 TABLETS BY MOUTH DAILY. 135 tablet 1   LORazepam (ATIVAN) 1 MG tablet Take 0.5-1 mg by mouth at bedtime as needed for sleep. For sleep     MAGNESIUM PO Take 400 mg by mouth daily.     Methylcobalamin (B-12) 1000 MCG TBDP Take 1,000 mcg by mouth daily.     mupirocin ointment (BACTROBAN) 2 % Apply 1 application topically 2 (two) times daily as needed (wound care).     nortriptyline (PAMELOR) 10 MG capsule Take 1 capsule (10 mg total) by mouth at bedtime. 30 capsule 5   OVER THE COUNTER MEDICATION Apply 1 application topically at bedtime as needed (pain). [employer] pain relief cream     potassium chloride (KLOR-CON) 10 MEQ tablet TAKE 1 TABLET BY MOUTH DAILY 90 tablet 3    pramipexole (MIRAPEX) 0.125 MG tablet Take 0.375 mg by mouth at bedtime.     promethazine (PHENERGAN) 12.5 MG tablet Take 1 tablet (12.5 mg total) by mouth every 4 (four) hours as needed for nausea or vomiting. 60 tablet 0   Riboflavin 400 MG CAPS Take 400 mg by mouth daily.     sennosides-docusate sodium (SENOKOT-S) 8.6-50 MG tablet Take 1 tablet by mouth daily as needed for constipation.     SYNTHROID 150 MCG tablet Take 150 mcg by mouth daily before breakfast.     triamcinolone (KENALOG) 0.025 % cream Apply 1 application topically 2 (two) times daily.     valsartan (DIOVAN) 80 MG tablet Take 40 mg by mouth daily.     verapamil (CALAN-SR) 120 MG CR tablet Take 120 mg by mouth  daily as needed (Tachycardia).     Vitamin D, Cholecalciferol, 50 MCG (2000 UT) CAPS Take 2,000 Units by mouth daily.     No current facility-administered medications on file prior to visit.    ALLERGIES: Allergies  Allergen Reactions   Codeine Nausea And Vomiting and Nausea Only   Penicillins Hives    Has patient had a PCN reaction causing immediate rash, facial/tongue/throat swelling, SOB or lightheadedness with hypotension: No Has patient had a PCN reaction causing severe rash involving mucus membranes or skin necrosis: No Has patient had a PCN reaction that required hospitalization No Has patient had a PCN reaction occurring within the last 10 years: No If all of the above answers are "NO", then may proceed with Cephalosporin use.   Tetracycline Rash   Tramadol Nausea Only   Digoxin Other (See Comments)    Flushed neck and face   Digoxin And Related Other (See Comments)    ' Makes my face blood red"   Erythromycin Other (See Comments) and Hives    Flushing on face   Other Other (See Comments)    ECG leads cause skin irritation. ECG leads cause skin irritation.   Sulfa Antibiotics Other (See Comments)    PRESERVATIVE OF SOME FOODS- MAKES FLUSHES TO FACE AND NECK   Tetracyclines & Related Other (See  Comments)    "Sores on legs"   Diltiazem Hcl Rash    Rash with long acting form   Lyrica [Pregabalin] Rash   Sulfites Rash    PRESERVATIVE OF SOME FOODS- MAKES FLUSHES TO FACE AND NECK PRESERVATIVE OF SOME FOODS- MAKES FLUSHES TO FACE AND NECK    FAMILY HISTORY: Family History  Problem Relation Age of Onset   Transient ischemic attack Mother    Heart failure Father    CVA Father    Lung cancer Father    Hypertension Sister    Cancer Brother    Sudden death Brother    Anesthesia problems Neg Hx    Hypotension Neg Hx    Malignant hyperthermia Neg Hx    Pseudochol deficiency Neg Hx    Heart attack Neg Hx       Objective:  *** General: No acute distress.  Patient appears ***-groomed.   Head:  Normocephalic/atraumatic Eyes:  Fundi examined but not visualized Neck: supple, no paraspinal tenderness, full range of motion Heart:  Regular rate and rhythm Neurological Exam: alert and oriented to person, place, and time.  Speech fluent and not dysarthric, language intact.  CN II-XII intact. Bulk and tone normal, muscle strength 5/5 throughout.  Sensation to light touch intact.  Deep tendon reflexes 2+ throughout, toes downgoing.  Finger to nose testing intact.  Gait normal, Romberg negative.   Shon Millet, DO  CC: Marden Noble, MD

## 2021-11-02 ENCOUNTER — Encounter: Payer: Self-pay | Admitting: Neurology

## 2021-11-02 ENCOUNTER — Ambulatory Visit (INDEPENDENT_AMBULATORY_CARE_PROVIDER_SITE_OTHER): Payer: Medicare Other | Admitting: Neurology

## 2021-11-02 VITALS — BP 173/85 | HR 74 | Resp 18 | Ht 69.0 in

## 2021-11-02 DIAGNOSIS — G629 Polyneuropathy, unspecified: Secondary | ICD-10-CM | POA: Diagnosis not present

## 2021-11-02 DIAGNOSIS — M47812 Spondylosis without myelopathy or radiculopathy, cervical region: Secondary | ICD-10-CM | POA: Diagnosis not present

## 2021-11-02 DIAGNOSIS — M47816 Spondylosis without myelopathy or radiculopathy, lumbar region: Secondary | ICD-10-CM | POA: Diagnosis not present

## 2021-11-02 DIAGNOSIS — G2581 Restless legs syndrome: Secondary | ICD-10-CM

## 2021-11-02 DIAGNOSIS — I10 Essential (primary) hypertension: Secondary | ICD-10-CM

## 2021-11-02 DIAGNOSIS — M5481 Occipital neuralgia: Secondary | ICD-10-CM | POA: Diagnosis not present

## 2021-11-02 MED ORDER — DULOXETINE HCL 30 MG PO CPEP: 30.0000 mg | ORAL_CAPSULE | Freq: Every day | ORAL | 3 refills | Status: DC

## 2021-11-02 NOTE — Patient Instructions (Signed)
To help with the headache and nerve pain in back of head, start duloxetine 30mg  daily.  We can increase dose in 4 weeks if needed.    Follow up 4 months.

## 2021-11-03 DIAGNOSIS — I5032 Chronic diastolic (congestive) heart failure: Secondary | ICD-10-CM | POA: Diagnosis not present

## 2021-11-03 DIAGNOSIS — K219 Gastro-esophageal reflux disease without esophagitis: Secondary | ICD-10-CM | POA: Diagnosis not present

## 2021-11-03 DIAGNOSIS — I4891 Unspecified atrial fibrillation: Secondary | ICD-10-CM | POA: Diagnosis not present

## 2021-11-03 DIAGNOSIS — J449 Chronic obstructive pulmonary disease, unspecified: Secondary | ICD-10-CM | POA: Diagnosis not present

## 2021-11-03 DIAGNOSIS — I10 Essential (primary) hypertension: Secondary | ICD-10-CM | POA: Diagnosis not present

## 2021-11-03 DIAGNOSIS — M170 Bilateral primary osteoarthritis of knee: Secondary | ICD-10-CM | POA: Diagnosis not present

## 2021-11-03 DIAGNOSIS — E039 Hypothyroidism, unspecified: Secondary | ICD-10-CM | POA: Diagnosis not present

## 2021-11-15 DIAGNOSIS — Z4789 Encounter for other orthopedic aftercare: Secondary | ICD-10-CM | POA: Diagnosis not present

## 2021-11-16 DIAGNOSIS — E039 Hypothyroidism, unspecified: Secondary | ICD-10-CM | POA: Diagnosis not present

## 2021-11-22 ENCOUNTER — Ambulatory Visit
Admission: RE | Admit: 2021-11-22 | Discharge: 2021-11-22 | Disposition: A | Discharge status: Home / Self Care | Payer: Medicare Other | Source: Ambulatory Visit | Attending: Internal Medicine | Admitting: Internal Medicine

## 2021-11-22 ENCOUNTER — Other Ambulatory Visit: Payer: Self-pay | Admitting: Internal Medicine

## 2021-11-22 DIAGNOSIS — Z1231 Encounter for screening mammogram for malignant neoplasm of breast: Secondary | ICD-10-CM | POA: Diagnosis not present

## 2021-11-22 NOTE — Telephone Encounter (Signed)
Prescription refill request for Eliquis received. Indication: PAF Last office visit: 01/29/21  Polly Cobia PA-C Scr: 0.61 on 08/23/21 Age: 88 Weight: 89.1kg  Based on above findings Eliquis 5mg  twice daily is the appropriate dose.  Refill approved.

## 2021-12-13 ENCOUNTER — Other Ambulatory Visit: Payer: Self-pay | Admitting: Internal Medicine

## 2022-02-01 DIAGNOSIS — Z23 Encounter for immunization: Secondary | ICD-10-CM | POA: Diagnosis not present

## 2022-02-09 DIAGNOSIS — E039 Hypothyroidism, unspecified: Secondary | ICD-10-CM | POA: Diagnosis not present

## 2022-02-09 DIAGNOSIS — K921 Melena: Secondary | ICD-10-CM | POA: Diagnosis not present

## 2022-02-23 ENCOUNTER — Other Ambulatory Visit: Payer: Self-pay | Admitting: Internal Medicine

## 2022-02-23 ENCOUNTER — Ambulatory Visit
Admission: RE | Admit: 2022-02-23 | Discharge: 2022-02-23 | Disposition: A | Discharge status: Home / Self Care | Payer: Medicare Other | Source: Ambulatory Visit | Attending: Internal Medicine | Admitting: Internal Medicine

## 2022-02-23 DIAGNOSIS — R059 Cough, unspecified: Secondary | ICD-10-CM | POA: Diagnosis not present

## 2022-02-23 DIAGNOSIS — R052 Subacute cough: Secondary | ICD-10-CM

## 2022-02-23 DIAGNOSIS — R195 Other fecal abnormalities: Secondary | ICD-10-CM | POA: Diagnosis not present

## 2022-02-23 DIAGNOSIS — R109 Unspecified abdominal pain: Secondary | ICD-10-CM | POA: Diagnosis not present

## 2022-02-28 DIAGNOSIS — D649 Anemia, unspecified: Secondary | ICD-10-CM | POA: Diagnosis not present

## 2022-02-28 DIAGNOSIS — R1319 Other dysphagia: Secondary | ICD-10-CM | POA: Diagnosis not present

## 2022-02-28 DIAGNOSIS — K921 Melena: Secondary | ICD-10-CM | POA: Diagnosis not present

## 2022-02-28 DIAGNOSIS — Z7901 Long term (current) use of anticoagulants: Secondary | ICD-10-CM | POA: Diagnosis not present

## 2022-03-01 ENCOUNTER — Other Ambulatory Visit: Payer: Self-pay | Admitting: Neurology

## 2022-03-02 ENCOUNTER — Telehealth: Payer: Self-pay | Admitting: Cardiology

## 2022-03-02 ENCOUNTER — Other Ambulatory Visit: Payer: Self-pay | Admitting: Gastroenterology

## 2022-03-02 ENCOUNTER — Ambulatory Visit: Payer: Medicare Other | Attending: Nurse Practitioner | Admitting: Nurse Practitioner

## 2022-03-02 ENCOUNTER — Encounter: Payer: Self-pay | Admitting: Nurse Practitioner

## 2022-03-02 VITALS — BP 158/80 | HR 80 | Ht 69.0 in | Wt 194.0 lb

## 2022-03-02 DIAGNOSIS — I4819 Other persistent atrial fibrillation: Secondary | ICD-10-CM | POA: Insufficient documentation

## 2022-03-02 DIAGNOSIS — R9431 Abnormal electrocardiogram [ECG] [EKG]: Secondary | ICD-10-CM | POA: Diagnosis not present

## 2022-03-02 DIAGNOSIS — I5032 Chronic diastolic (congestive) heart failure: Secondary | ICD-10-CM | POA: Insufficient documentation

## 2022-03-02 DIAGNOSIS — Z7901 Long term (current) use of anticoagulants: Secondary | ICD-10-CM | POA: Diagnosis not present

## 2022-03-02 DIAGNOSIS — Z01810 Encounter for preprocedural cardiovascular examination: Secondary | ICD-10-CM | POA: Insufficient documentation

## 2022-03-02 NOTE — Telephone Encounter (Signed)
I s/w the pt and she has been scheduled today to see Eligha Bridegroom, NP at the [facility] office @ 1:55. I stated this is the best that I could get for her in trying to help her. Pt is very upset with our office. I did tell her that a doctors office cannot give the pt's cardiology office just 2 days notice that a procedure is going to be done, especially when the pt is on blood thinners such as herself. I stated that it is our responsibility to be sure that we make a well informed decision as to clearing a  for surgery/procedures.   Pt agreeable to plan of care for today. She said she needs to take a shower and wash and her hair and that they live 30 minutes away. She said she will try to get there on time. I will update all parties involved in this matter.

## 2022-03-02 NOTE — Telephone Encounter (Signed)
   Name: Natasha [name]  DOB: [birth year]  MRN: [id number]  Primary Cardiologist: Armanda Magic, MD  Chart reviewed as part of pre-operative protocol coverage. Because of Rexanne B Schiraldi's past medical history and time since last visit, she will require a follow-up in-office visit in order to better assess preoperative cardiovascular risk.  Pre-op covering staff: - Please schedule appointment and call patient to inform them. If patient already had an upcoming appointment within acceptable timeframe, please add "pre-op clearance" to the appointment notes so provider is aware. - Please contact requesting surgeon's office via preferred method (i.e, phone, fax) to inform them of need for appointment prior to surgery.  Anticoagulation addressed by pharmacy below.  Procedure will likely need to be postponed.  Sharlene Dory, PA-C  03/02/2022, 11:13 AM

## 2022-03-02 NOTE — Patient Instructions (Signed)
Medication Instructions:   Your physician recommends that you continue on your current medications as directed. Please refer to the Current Medication list given to you today.   *If you need a refill on your cardiac medications before your next appointment, please call your pharmacy*   Lab Work:  None ordered.  If you have labs (blood work) drawn today and your tests are completely normal, you will receive your results only by: MyChart Message (if you have MyChart) OR A paper copy in the mail If you have any lab test that is abnormal or we need to change your treatment, we will call you to review the results.   Testing/Procedures:  You are scheduled for a Myocardial Perfusion Imaging Study on Thursday, November 16 at 10:00 am.   Please arrive 15 minutes prior to your appointment time for registration and insurance purposes.   The test will take approximately 3 to 4 hours to complete; you may bring reading material. If someone comes with you to your appointment, they will need to remain in the main lobby due to limited space in the testing area.   How to prepare for your Myocardial Perfusion test:   Do not eat or drink 3 hours prior to your test, except you may have water.    Do not consume products containing caffeine (regular or decaffeinated) 12 hours prior to your test (ex: coffee, chocolate, soda, tea)   Do bring a list of your current medications with you. If not listed below, you may take your medications as normal.    Do not take Lasix prior to the test.   Bring any held medication to your appointment, as you may be required to take it once the test is complete.   Do wear comfortable clothes (no dresses ) and walking shoes. Tennis shoes are preferred. No heels or open toed shoes.  Do not wear  perfume, or lotions (deodorant is allowed).   If these instructions are not followed, you test will have to be rescheduled.   Please report to [address] for your test. If you have questions or concerns about your appointment, please call the Nuclear Lab at #[phone].  If you cannot keep your appointment, please provide 24 hour notification to the Nuclear lab to avoid a possible $50 charge to your account.    Follow-Up: At [facility], you and your health needs are our priority.  As part of our continuing mission to provide you with exceptional heart care, we have created designated Provider Care Teams.  These Care Teams include your primary Cardiologist (physician) and Advanced Practice Providers (APPs -  Physician Assistants and Nurse Practitioners) who all work together to provide you with the care you need, when you need it.  We recommend signing up for the patient portal called "MyChart".  Sign up information is provided on this After Visit Summary.  MyChart is used to connect with patients for Virtual Visits (Telemedicine).  Patients are able to view lab/test results, encounter notes, upcoming appointments, etc.  Non-urgent messages can be sent to your provider as well.   To learn more about what you can do with MyChart, go to [email or web address].    Your next appointment:   1 month(s)  The format for your next appointment:   In Person  Provider:   Sharrell Ku, MD   Other Instructions   Important Information About Sugar

## 2022-03-02 NOTE — Telephone Encounter (Signed)
   Pre-operative Risk Assessment    Patient Name: Natasha [name]  DOB: [birth year] MRN: [id number]      Request for Surgical Clearance    Procedure:   Endoscopy  Date of Surgery:  Clearance 03/04/22                                 Surgeon:  Dr. Lizbeth [name] Surgeon's Group or Practice Name:  [facility] Phone number:  [phone], [phone] Fax number:  [phone]   Type of Clearance Requested:   - Medical  - Pharmacy:  Hold Apixaban (Eliquis)     Type of Anesthesia:  MAC   Additional requests/questions:   Caller stated they will need medical and pharmacy clearance.  Signed, Heloise Beecham   03/02/2022, 8:23 AM

## 2022-03-02 NOTE — Telephone Encounter (Signed)
I s/w the pt and informed her per the pre op provider the pt is going to need an appt for pre op clearance in office appt. I called the pt and stated to her that I have

## 2022-03-02 NOTE — Telephone Encounter (Signed)
Patient with diagnosis of atrial fibriliation on Eliquis for anticoagulation.    Procedure: Endoscopy Date of procedure: 03/04/22       CHA2DS2-VASc Score = 5   This indicates a 7.2% annual risk of stroke. The patient's score is based upon age, gender, CHF, hypertension)  CrCl 76 ml/min (SrCr 0.61 08/23/2021 ) Platelet count - last lab from 10/2020    Per office protocol, patient can hold Eliquis for 1-2 days prior to procedure.   Patient will not need bridging with Lovenox (enoxaparin) around procedure.  **This guidance is not considered finalized until pre-operative APP has relayed final recommendations.**

## 2022-03-02 NOTE — Progress Notes (Signed)
Cardiology Office Note:    Date:  03/02/2022   ID:  Natasha [name], DOB January 20, 1933, MRN [id number]  PCP:  Thana Ates, MD   [facility] Providers Cardiologist:  Armanda Magic, MD Electrophysiologist:  Lewayne Bunting, MD     Referring MD: Thana Ates, MD   Chief Complaint: Preoperative cardiac evaluation  History of Present Illness:    Natasha [name] is a very pleasant  88 y.o. female with a hx of HTN, chronic HFpEF, persistent atrial fibrillation on Eliquis, pulmonary hypertension, and hypothyroidism.   She underwent right heart cath 07/11/2014 for elevated PA pressure on echo which revealed mild pulmonary hypertension felt to be primarily pulmonary venous hypertension with elevated left atrial pressures, LV diastolic dysfunction and elevated right-sided filling pressure. Advised to start Lasix.   History of 2 failed A-fib ablations at [facility] and multiple antiarrhythmic intolerances (Multaq, Rythmol, flecainide, Tikosyn) and she refused amiodarone.   Last cardiology clinic visit was 01/29/2021 with Otilio Saber, PA at which time she was in [facility]. No changes were made to her treatment plan and 6 month follow-up was recommended. She was seen by Dr. Mayford Knife on 12/17/2020 with no significant cardiac concerns.   Today, she is here for preoperative cardiac evaluation for endoscopy that is scheduled for 03/04/2022. Reports approximately one month ago, she aspirated a piece of hamburger and coughed extensively. Following that incident, she had black tarry stools for which she stopped Eliquis. She resumed Eliquis when stools turned brown but she has since stopped it again in preparation for EGD. Also had pneumonia after the aspiration. Constant "gnawing" sensation in her abdomen. She denies chest pain. Has mild shortness of breath that she feels is stable, no orthopnea, PND, presyncope, or syncope. Walks 15-20 minutes most when weather is nice. Uses her rolling walker or cane when going out due to  feeling off-balance. She is unable to achieve > 4 METS activity.   Past Medical History:  Diagnosis Date   CHF (congestive heart failure) (HCC)    pt. denies   Chronic diastolic heart failure (HCC) 04/22/2015   Complication of anesthesia    COPD (chronic obstructive pulmonary disease) (HCC)    Diverticular disease    DJD (degenerative joint disease)    Dysphagia    Dysrhythmia    afib   GERD (gastroesophageal reflux disease)    Hiatal hernia    Hypertension    Hypothyroidism    Lower leg edema    Neuromuscular disorder (HCC)    neuropathy   PAF (paroxysmal atrial fibrillation) (HCC)    s/p ablation x several times now with occasional episodes of PAF.   She has failed sotolol/dofetilide/flecainide/propafenone/Amio and dronedarone either with not controlling her PAF or having side effects   Pneumonia    PONV (postoperative nausea and vomiting)    Restless leg syndrome    Seasonal allergies     Past Surgical History:  Procedure Laterality Date   ABDOMINAL HYSTERECTOMY     APPENDECTOMY     ATRIAL FIBRILLATION ABLATION  09/06/2013   2'14 -[facility]   BALLOON DILATION  03/03/2011   Procedure: BALLOON DILATION;  Surgeon: Charolett Bumpers, MD;  Location: [facility] ENDOSCOPY;  Service: Endoscopy;  Laterality: N/A;   bladder polyps     BREAST EXCISIONAL BIOPSY Left    BREAST SURGERY     left lumpectomy   CARDIOVERSION     x3   CARDIOVERSION N/A 12/30/2014   Procedure: CARDIOVERSION;  Surgeon: Lewayne Bunting, MD;  Location: [facility] ENDOSCOPY;  Service: Cardiovascular;  Laterality: N/A;   CARDIOVERSION N/A 04/23/2015   Procedure: CARDIOVERSION;  Surgeon: Thurmon Fair, MD;  Location: [facility] ENDOSCOPY;  Service: Cardiovascular;  Laterality: N/A;   CATARACT EXTRACTION Left    COLONOSCOPY WITH PROPOFOL N/A 09/24/2013   Procedure: COLONOSCOPY WITH PROPOFOL;  Surgeon: Charolett Bumpers, MD;  Location: [facility] ENDOSCOPY;  Service: Endoscopy;  Laterality: N/A;   ESOPHAGOGASTRODUODENOSCOPY   03/03/2011   Procedure: ESOPHAGOGASTRODUODENOSCOPY (EGD);  Surgeon: Charolett Bumpers, MD;  Location: [facility];  Service: Endoscopy;  Laterality: N/A;   EYE SURGERY     cataracat   RHINOPLASTY     RIGHT HEART CATHETERIZATION N/A 07/11/2014   Procedure: RIGHT HEART CATH;  Surgeon: Laurey Morale, MD;  Location: [facility] CATH LAB;  Service: Cardiovascular;  Laterality: N/A;   ROTATOR CUFF REPAIR     THYROIDECTOMY  1973   TONSILLECTOMY  1942   TOTAL KNEE ARTHROPLASTY Right 10/28/2020   Procedure: TOTAL KNEE ARTHROPLASTY;  Surgeon: Eugenia Mcalpine, MD;  Location: [facility] ORS;  Service: Orthopedics;  Laterality: Right;  with adductor canal   TUBAL LIGATION      Current Medications: No outpatient medications have been marked as taking for the 03/02/22 encounter (Office Visit) with Lissa Hoard, Zachary George, NP.     Allergies:   Codeine, Penicillins, Tetracycline, Tramadol, Digoxin, Digoxin and related, Erythromycin, Other, Sulfa antibiotics, Tetracyclines & related, Diltiazem hcl, Lyrica [pregabalin], and Sulfites   Social History   Socioeconomic History   Marital status: Married    Spouse name: Not on file   Number of children: Not on file   Years of education: Not on file   Highest education level: Not on file  Occupational History   Occupation: retired  Tobacco Use   Smoking status: Never    Passive exposure: Past   Smokeless tobacco: Never  Vaping Use   Vaping Use: Never used  Substance and Sexual Activity   Alcohol use: Not Currently    Comment: rare wine   Drug use: No   Sexual activity: Not Currently    Birth control/protection: Post-menopausal  Other Topics Concern   Not on file  Social History Narrative   Right handed   Two story home   Drinks caffeine   Social Determinants of Health   Financial Resource Strain: Not on file  Food Insecurity: Not on file  Transportation Needs: Not on file  Physical Activity: Not on file  Stress: Not on file  Social Connections: Not on  file     Family History: The patient's family history includes CVA in her father; Cancer in her brother; Heart failure in her father; Hypertension in her sister; Lung cancer in her father; Sudden death in her brother; Transient ischemic attack in her mother. There is no history of Anesthesia problems, Hypotension, Malignant hyperthermia, Pseudochol deficiency, or Heart attack.  ROS:   Please see the history of present illness.  All other systems reviewed and are negative.  Labs/Other Studies Reviewed:    The following studies were reviewed today:  Echo 01/15/2018  Left ventricle: The cavity size was mildly reduced. There was    mild focal basal hypertrophy of the septum. Systolic function was    normal. The estimated ejection fraction was in the range of 55%    to 60%. Wall motion was normal; there were no regional wall    motion abnormalities. Doppler parameters are consistent with a    reversible restrictive pattern, indicative of decreased left  ventricular diastolic compliance and/or increased left atrial    pressure (grade 3 diastolic dysfunction).  - Aortic valve: There was no significant regurgitation.  - Mitral valve: There was mild regurgitation.  - Atrial septum: No defect or patent foramen ovale was identified.  - Tricuspid valve: There was moderate regurgitation.  - Pulmonic valve: There was mild regurgitation.  - Pulmonary arteries: Systolic pressure was within the normal    range.   Impressions:   - Normal LV EF with Doppler parameters consistent with restrictive    filling/grade 3 diastolic dysfunction.  Lexiscan Myoview 03/08/2017  Nuclear stress EF: 80%. There was no ST segment deviation noted during stress. The study is normal. This is a low risk study. The left ventricular ejection fraction is hyperdynamic (>65%).   Breast attenuation no ischemia or infarct EF 80%    Recent Labs:  Recent Lipid Panel    Risk Assessment/Calculations:     CHA2DS2-VASc Score = 5   This indicates a 7.2% annual risk of stroke. The patient's score is based upon: CHF History: 1 HTN History: 1 Diabetes History: 0 Stroke History: 0 Vascular Disease History: 0 Age Score: 2 Gender Score: 1    Physical Exam:    VS:  BP (!) 158/80   Pulse 80   Ht 5\' 9"  (1.753 m)   Wt 194 lb (88 kg)   SpO2 97%   BMI 28.65 kg/m     Wt Readings from Last 3 Encounters:  03/02/22 194 lb (88 kg)  04/27/21 195 lb (88.5 kg)  01/29/21 196 lb 6.4 oz (89.1 kg)     GEN: Well nourished, well developed in no acute distress HEENT: Normal NECK: No JVD; No carotid bruits CARDIAC: RRR, no murmurs, rubs, gallops RESPIRATORY:  Clear to auscultation without rales, wheezing or rhonchi  ABDOMEN: Soft, non-tender, non-distended MUSCULOSKELETAL:  No edema; No deformity. 2+ pedal pulses, equal bilaterally SKIN: Warm and dry NEUROLOGIC:  Alert and oriented x 3 PSYCHIATRIC:  Normal affect   EKG:  EKG is ordered today.  The ekg ordered today demonstrates sinus rhythm at 80 bpm with first-degree AV block, PR 272 ms, new lateral T wave inversions  Diagnoses:    1. Preoperative cardiovascular examination   2. Abnormal electrocardiogram   3. Persistent atrial fibrillation (HCC)   4. Chronic diastolic heart failure (HCC)   5. Chronic anticoagulation    Assessment and Plan:     Preoperative cardiac evaluation: The patient is feeling well from a cardiac perspective. She is unable to achieve > 4 METS activity and has new TWI on EKG as noted below. Based on  ACC/AHA guidelines, she will need cardiovascular testing before we can provide clearance. We will get Lexiscan Myoview to evaluate for ischemia. No chest pain, DOE, or other symptoms concerning for angina. Per office protocol, patient can hold Eliquis for 1-2 days prior to procedure. She will not need bridging with Lovenox (enoxaparin) around procedure. According to the Revised Cardiac Risk Index (RCRI), her Perioperative  Risk of Major Cardiac Event is (%): 0.9. Her Functional Capacity in METs is: 3.97 according to the [facility] Activity Status Index (DASI).  Abnormal EKG: New T wave inversions on EKG and lateral leads in comparison to last EKG 01/29/2021. She is asymptomatic. No chest pain, dyspnea, fatigue, or other concerning symptoms over the previous year. We will get a nuclear stress test to evaluate for ischemia.   Chronic HFpEF: LVEF 55-60%, G3DD on echo 12/2017.  Has mild shortness of breath that she feels  is stable.  No edema, orthopnea, PND.  Appears euvolemic on exam. BP is elevated. She rushed to get here today for this appointment and we failed to recheck it today. Will check at return office visit in 1 month.   Persistent atrial fibrillation on chronic anticoagulation: Maintaining sinus rhythm today, HR 80 bpm.  No concerns with rate sent return of A-fib. No bleeding concerns. Eliquis 5 mg twice daily is appropriate for age/weight/creatinine. Continue verapamil, Eliquis.  Shared Decision Making/Informed Consent The risks [chest pain, shortness of breath, cardiac arrhythmias, dizziness, blood pressure fluctuations, myocardial infarction, stroke/transient ischemic attack, nausea, vomiting, allergic reaction, radiation exposure, metallic taste sensation and life-threatening complications (estimated to be 1 in 10,000)], benefits (risk stratification, diagnosing coronary artery disease, treatment guidance) and alternatives of a nuclear stress test were discussed in detail with Natasha [name] and she agrees to proceed.   Disposition: Keep your December appointment with Dr. Ladona Ridgel and January appointment with Dr. Mayford Knife  Medication Adjustments/Labs and Tests Ordered: Current medicines are reviewed at length with the patient today.  Concerns regarding medicines are outlined above.  Orders Placed This Encounter  Procedures   Cardiac Stress Test: Informed Consent Details: Physician/Practitioner Attestation; Transcribe to  consent form and obtain patient signature   Myocardial Perfusion Imaging   EKG 12-Lead   No orders of the defined types were placed in this encounter.   Patient Instructions  Medication Instructions:   Your physician recommends that you continue on your current medications as directed. Please refer to the Current Medication list given to you today.   *If you need a refill on your cardiac medications before your next appointment, please call your pharmacy*   Lab Work:  None ordered.  If you have labs (blood work) drawn today and your tests are completely normal, you will receive your results only by: MyChart Message (if you have MyChart) OR A paper copy in the mail If you have any lab test that is abnormal or we need to change your treatment, we will call you to review the results.   Testing/Procedures:  You are scheduled for a Myocardial Perfusion Imaging Study on Thursday, November 16 at 10:00 am.   Please arrive 15 minutes prior to your appointment time for registration and insurance purposes.   The test will take approximately 3 to 4 hours to complete; you may bring reading material. If someone comes with you to your appointment, they will need to remain in the main lobby due to limited space in the testing area.   How to prepare for your Myocardial Perfusion test:   Do not eat or drink 3 hours prior to your test, except you may have water.    Do not consume products containing caffeine (regular or decaffeinated) 12 hours prior to your test (ex: coffee, chocolate, soda, tea)   Do bring a list of your current medications with you. If not listed below, you may take your medications as normal.    Do not take Lasix prior to the test.   Bring any held medication to your appointment, as you may be required to take it once the test is complete.   Do wear comfortable clothes (no dresses ) and walking shoes. Tennis shoes are preferred. No heels or open toed shoes.  Do not  wear  perfume, or lotions (deodorant is allowed).   If these instructions are not followed, you test will have to be rescheduled.   Please report to [address] for your  test. If you have questions or concerns about your appointment, please call the Nuclear Lab at #[phone].  If you cannot keep your appointment, please provide 24 hour notification to the Nuclear lab to avoid a possible $50 charge to your account.    Follow-Up: At [facility], you and your health needs are our priority.  As part of our continuing mission to provide you with exceptional heart care, we have created designated Provider Care Teams.  These Care Teams include your primary Cardiologist (physician) and Advanced Practice Providers (APPs -  Physician Assistants and Nurse Practitioners) who all work together to provide you with the care you need, when you need it.  We recommend signing up for the patient portal called "MyChart".  Sign up information is provided on this After Visit Summary.  MyChart is used to connect with patients for Virtual Visits (Telemedicine).  Patients are able to view lab/test results, encounter notes, upcoming appointments, etc.  Non-urgent messages can be sent to your provider as well.   To learn more about what you can do with MyChart, go to [email or web address].    Your next appointment:   1 month(s)  The format for your next appointment:   In Person  Provider:   Sharrell Ku, MD   Other Instructions   Important Information About Sugar         Signed, Levi Aland, NP  03/02/2022 4:32 PM    [facility]

## 2022-03-03 ENCOUNTER — Ambulatory Visit (HOSPITAL_COMMUNITY): Payer: Medicare Other | Attending: Nurse Practitioner

## 2022-03-03 DIAGNOSIS — R9431 Abnormal electrocardiogram [ECG] [EKG]: Secondary | ICD-10-CM

## 2022-03-03 LAB — MYOCARDIAL PERFUSION IMAGING
LV dias vol: 57 mL (ref 46–106)
LV sys vol: 19 mL
Nuc Stress EF: 66 %
Peak HR: 88 {beats}/min
Rest HR: 76 {beats}/min
Rest Nuclear Isotope Dose: 10.6 mCi
SDS: 4
SRS: 1
SSS: 5
ST Depression (mm): 0 mm
Stress Nuclear Isotope Dose: 31 mCi
TID: 0.95

## 2022-03-03 MED ORDER — REGADENOSON 0.4 MG/5ML IV SOLN
0.4000 mg | Freq: Once | INTRAVENOUS | Status: AC
  Administered 2022-03-03: 0.4 mg via INTRAVENOUS

## 2022-03-03 MED ORDER — TECHNETIUM TC 99M TETROFOSMIN IV KIT
10.6000 | PACK | Freq: Once | INTRAVENOUS | Status: AC | PRN
  Administered 2022-03-03: 10.6 via INTRAVENOUS

## 2022-03-03 MED ORDER — TECHNETIUM TC 99M TETROFOSMIN IV KIT
31.0000 | PACK | Freq: Once | INTRAVENOUS | Status: AC | PRN
  Administered 2022-03-03: 31 via INTRAVENOUS

## 2022-03-04 ENCOUNTER — Ambulatory Visit (HOSPITAL_COMMUNITY): Payer: Medicare Other | Admitting: Certified Registered"

## 2022-03-04 ENCOUNTER — Encounter (HOSPITAL_COMMUNITY): Payer: Self-pay | Admitting: Gastroenterology

## 2022-03-04 ENCOUNTER — Other Ambulatory Visit: Payer: Self-pay

## 2022-03-04 ENCOUNTER — Telehealth: Payer: Self-pay | Admitting: Cardiology

## 2022-03-04 ENCOUNTER — Ambulatory Visit (HOSPITAL_BASED_OUTPATIENT_CLINIC_OR_DEPARTMENT_OTHER): Payer: Medicare Other | Admitting: Certified Registered"

## 2022-03-04 ENCOUNTER — Encounter (HOSPITAL_COMMUNITY)
Admission: RE | Disposition: A | Discharge status: Home / Self Care | Payer: Self-pay | Source: Home / Self Care | Attending: Gastroenterology

## 2022-03-04 ENCOUNTER — Ambulatory Visit (HOSPITAL_COMMUNITY)
Admission: RE | Admit: 2022-03-04 | Discharge: 2022-03-04 | Disposition: A | Discharge status: Home / Self Care | Payer: Medicare Other | Attending: Gastroenterology | Admitting: Gastroenterology

## 2022-03-04 DIAGNOSIS — R1319 Other dysphagia: Secondary | ICD-10-CM | POA: Diagnosis not present

## 2022-03-04 DIAGNOSIS — J449 Chronic obstructive pulmonary disease, unspecified: Secondary | ICD-10-CM | POA: Insufficient documentation

## 2022-03-04 DIAGNOSIS — I480 Paroxysmal atrial fibrillation: Secondary | ICD-10-CM | POA: Diagnosis not present

## 2022-03-04 DIAGNOSIS — D649 Anemia, unspecified: Secondary | ICD-10-CM | POA: Insufficient documentation

## 2022-03-04 DIAGNOSIS — K921 Melena: Secondary | ICD-10-CM | POA: Diagnosis not present

## 2022-03-04 DIAGNOSIS — I110 Hypertensive heart disease with heart failure: Secondary | ICD-10-CM | POA: Insufficient documentation

## 2022-03-04 DIAGNOSIS — I509 Heart failure, unspecified: Secondary | ICD-10-CM | POA: Insufficient documentation

## 2022-03-04 DIAGNOSIS — R1314 Dysphagia, pharyngoesophageal phase: Secondary | ICD-10-CM | POA: Diagnosis not present

## 2022-03-04 DIAGNOSIS — K449 Diaphragmatic hernia without obstruction or gangrene: Secondary | ICD-10-CM | POA: Insufficient documentation

## 2022-03-04 DIAGNOSIS — E039 Hypothyroidism, unspecified: Secondary | ICD-10-CM | POA: Diagnosis not present

## 2022-03-04 DIAGNOSIS — K219 Gastro-esophageal reflux disease without esophagitis: Secondary | ICD-10-CM | POA: Diagnosis not present

## 2022-03-04 DIAGNOSIS — Z7901 Long term (current) use of anticoagulants: Secondary | ICD-10-CM | POA: Diagnosis not present

## 2022-03-04 HISTORY — PX: ESOPHAGOGASTRODUODENOSCOPY (EGD) WITH PROPOFOL: SHX5813

## 2022-03-04 SURGERY — ESOPHAGOGASTRODUODENOSCOPY (EGD) WITH PROPOFOL
Anesthesia: Monitor Anesthesia Care | Laterality: Bilateral

## 2022-03-04 MED ORDER — SODIUM CHLORIDE 0.9 % IV SOLN: INTRAVENOUS | Status: DC

## 2022-03-04 MED ORDER — PROPOFOL 10 MG/ML IV BOLUS
INTRAVENOUS | Status: DC | PRN
  Administered 2022-03-04 (×2): 20 mg via INTRAVENOUS
  Administered 2022-03-04: 30 mg via INTRAVENOUS

## 2022-03-04 MED ORDER — LACTATED RINGERS IV SOLN: INTRAVENOUS | Status: DC

## 2022-03-04 MED ORDER — PROPOFOL 500 MG/50ML IV EMUL
INTRAVENOUS | Status: DC | PRN
  Administered 2022-03-04: 100 ug/kg/min via INTRAVENOUS

## 2022-03-04 MED ORDER — LIDOCAINE 2% (20 MG/ML) 5 ML SYRINGE
INTRAMUSCULAR | Status: DC | PRN
  Administered 2022-03-04: 60 mg via INTRAVENOUS

## 2022-03-04 SURGICAL SUPPLY — 15 items

## 2022-03-04 NOTE — Anesthesia Preprocedure Evaluation (Addendum)
Anesthesia Evaluation  Patient identified by MRN, date of birth, ID band Patient awake    Reviewed: Allergy & Precautions, NPO status , Patient's Chart, lab work & pertinent test results  History of Anesthesia Complications (+) PONV and history of anesthetic complications  Airway Mallampati: II  TM Distance: >3 FB Neck ROM: Full    Dental  (+) Dental Advisory Given, Caps, Implants   Pulmonary COPD   Pulmonary exam normal breath sounds clear to auscultation       Cardiovascular hypertension, Pt. on medications +CHF  Normal cardiovascular exam+ dysrhythmias (2 failed A-fib ablations at [facility] and multiple antiarrhythmic intolerances (Multaq, Rythmol, flecainide, Tikosyn) and she refused amiodarone) Atrial Fibrillation  Rhythm:Regular Rate:Normal     Neuro/Psych  Neuromuscular disease    GI/Hepatic Neg liver ROS,GERD  Medicated,,dysphagia, melena   Endo/Other  Hypothyroidism    Renal/GU negative Renal ROS     Musculoskeletal  (+) Arthritis ,    Abdominal   Peds  Hematology  (+) Blood dyscrasia (Eliquis)   Anesthesia Other Findings Day of surgery medications reviewed with the patient.  Reproductive/Obstetrics                             Anesthesia Physical Anesthesia Plan  ASA: 3  Anesthesia Plan: MAC   Post-op Pain Management: Minimal or no pain anticipated   Induction: Intravenous  PONV Risk Score and Plan: 3 and TIVA and Treatment may vary due to age or medical condition  Airway Management Planned: Nasal Cannula and Natural Airway  Additional Equipment:   Intra-op Plan:   Post-operative Plan:   Informed Consent: I have reviewed the patients History and Physical, chart, labs and discussed the procedure including the risks, benefits and alternatives for the proposed anesthesia with the patient or authorized representative who has indicated his/her understanding and acceptance.      Dental advisory given  Plan Discussed with: CRNA and Anesthesiologist  Anesthesia Plan Comments:        Anesthesia Quick Evaluation

## 2022-03-04 NOTE — Transfer of Care (Signed)
Immediate Anesthesia Transfer of Care Note  Patient: Natasha [name]  Procedure(s) Performed: ESOPHAGOGASTRODUODENOSCOPY (EGD) WITH PROPOFOL (Bilateral) BALLOON DILATION (Bilateral)  Patient Location: PACU and Endoscopy Unit  Anesthesia Type:MAC  Level of Consciousness: drowsy and patient cooperative  Airway & Oxygen Therapy: Patient Spontanous Breathing and Patient connected to face mask oxygen  Post-op Assessment: Report given to RN and Post -op Vital signs reviewed and stable  Post vital signs: Reviewed and stable  Last Vitals:  Vitals Value Taken Time  BP    Temp    Pulse 70 03/04/22 1156  Resp 20 03/04/22 1156  SpO2 100 % 03/04/22 1156  Vitals shown include unvalidated device data.  Last Pain:  Vitals:   03/04/22 1059  TempSrc: Temporal  PainSc: 0-No pain         Complications: No notable events documented.

## 2022-03-04 NOTE — Telephone Encounter (Signed)
Caller is following-up on status of patient's clearance.  Caller stated patient has already been holding her Eliquis.

## 2022-03-04 NOTE — Anesthesia Postprocedure Evaluation (Signed)
Anesthesia Post Note  Patient: REVECA DESMARAIS  Procedure(s) Performed: ESOPHAGOGASTRODUODENOSCOPY (EGD) WITH PROPOFOL (Bilateral) BALLOON DILATION (Bilateral)     Patient location during evaluation: Endoscopy Anesthesia Type: MAC Level of consciousness: oriented, awake and alert and awake Pain management: pain level controlled Vital Signs Assessment: post-procedure vital signs reviewed and stable Respiratory status: spontaneous breathing, nonlabored ventilation, respiratory function stable and patient connected to nasal cannula oxygen Cardiovascular status: blood pressure returned to baseline and stable Postop Assessment: no headache, no backache and no apparent nausea or vomiting Anesthetic complications: no   No notable events documented.  Last Vitals:  Vitals:   03/04/22 1205 03/04/22 1215  BP: 134/61 122/72  Pulse: 73 73  Resp: (!) 22 17  Temp:    SpO2: 100% 100%    Last Pain:  Vitals:   03/04/22 1215  TempSrc:   PainSc: 0-No pain                 Santa Lighter

## 2022-03-04 NOTE — Anesthesia Procedure Notes (Signed)
Procedure Name: MAC Date/Time: 03/04/2022 11:45 AM  Performed by: Renato Shin, CRNAPre-anesthesia Checklist: Patient identified, Emergency Drugs available, Suction available and Patient being monitored Patient Re-evaluated:Patient Re-evaluated prior to induction Oxygen Delivery Method: Simple face mask Preoxygenation: Pre-oxygenation with 100% oxygen Induction Type: IV induction Airway Equipment and Method: Bite block Placement Confirmation: positive ETCO2 and breath sounds checked- equal and bilateral Dental Injury: Teeth and Oropharynx as per pre-operative assessment

## 2022-03-04 NOTE — Op Note (Signed)
[facility] Patient Name: Natasha [name] Procedure Date: 03/04/2022 MRN: [id number] Attending MD: Vida Rigger , MD, [id number] Date of Birth: 03/12/1933 CSN: [id number] Age: 88 Admit Type: Outpatient Procedure:                Upper GI endoscopy Indications:              Melena Providers:                Vida Rigger, MD, Lorenza Evangelist, RN, Beryle Beams,                            Technician, Leona Singleton CRNA Referring MD:              Medicines:                Monitored Anesthesia Care Complications:            No immediate complications. Estimated Blood Loss:     Estimated blood loss: none. Procedure:                Pre-Anesthesia Assessment:                           - Prior to the procedure, a History and Physical                            was performed, and patient medications and                            allergies were reviewed. The patient's tolerance of                            previous anesthesia was also reviewed. The risks                            and benefits of the procedure and the sedation                            options and risks were discussed with the patient.                            All questions were answered, and informed consent                            was obtained. Prior Anticoagulants: The patient has                            taken Eliquis (apixaban), last dose was 3 days                            prior to procedure. ASA Grade Assessment: III - A                            patient with severe systemic disease. After  reviewing the risks and benefits, the patient was                            deemed in satisfactory condition to undergo the                            procedure.                           After obtaining informed consent, the endoscope was                            passed under direct vision. Throughout the                            procedure, the patient's blood pressure, pulse, and                             oxygen saturations were monitored continuously. The                            [id number]-H190 ([id number]) Olympus endoscope was introduced                            through the mouth, and advanced to the fourth part                            of duodenum. The upper GI endoscopy was                            accomplished without difficulty. The patient                            tolerated the procedure well. Scope In: Scope Out: Findings:      A large hiatal hernia was present.      The entire examined stomach was normal.      The duodenal bulb, first portion of the duodenum, second portion of the       duodenum, third portion of the duodenum and fourth portion of the       duodenum were normal.      The exam was otherwise without abnormality. Impression:               - Large hiatal hernia.                           - Normal stomach.                           - Normal duodenal bulb, first portion of the                            duodenum, second portion of the duodenum, third                            portion of the duodenum and fourth  portion of the                            duodenum.                           - The examination was otherwise normal.                           - No specimens collected. Moderate Sedation:      Not Applicable - Patient had care per Anesthesia. Recommendation:           - Patient has a contact number available for                            emergencies. The signs and symptoms of potential                            delayed complications were discussed with the                            patient. Return to normal activities tomorrow.                            Written discharge instructions were provided to the                            patient.                           - Soft diet today.                           - Continue present medications.                           - Return to GI clinic in 4 weeks.                           -  Telephone GI clinic if symptomatic PRN.                           - Resume Eliquis (apixaban) at prior dose tomorrow                            if primary team wants you to restart it otherwise                            okay with me if you switch to a baby aspirin a day                            as long as you continue your Nexium. Procedure Code(s):        --- Professional ---                           [phone], Esophagogastroduodenoscopy,  flexible,                            transoral; diagnostic, including collection of                            specimen(s) by brushing or washing, when performed                            (separate procedure) Diagnosis Code(s):        --- Professional ---                           K44.9, Diaphragmatic hernia without obstruction or                            gangrene                           K92.1, Melena (includes Hematochezia) CPT copyright 2022 [employer]. All rights reserved. The codes documented in this report are preliminary and upon coder review may  be revised to meet current compliance requirements. Vida Rigger, MD 03/04/2022 12:04:57 PM This report has been signed electronically. Number of Addenda: 0

## 2022-03-04 NOTE — Telephone Encounter (Signed)
I s/w Tiffany with requesting office and she is aware the pt has been cleared. Tiffany thanked me for the call

## 2022-03-04 NOTE — Progress Notes (Signed)
Natasha [name] 11:37 AM  Subjective: Patient seen and examined in her hospital computer chart reviewed and her case discussed with our PA in the office and she had a few days of melena on Eliquis which has since been stopped and it all started after a food impaction which resolved with her coughing fit and otherwise she has not had any GI issues but has been stretched years ago and her last colonoscopy was about 8 years ago and she has no other complaints and does not take any extra aspirin and nonsteroidals  Objective: Vital signs stable afebrile no acute distress exam please see preassessment evaluation hemoglobin minimal drop in the office  Assessment: Melena in a patient on Eliquis  Plan: We discussed endoscopy and okay to proceed with anesthesia assistance with further work-up and plans pending those findings  [facility]  office [phone] After 5PM or if no answer call [phone]

## 2022-03-04 NOTE — Discharge Instructions (Addendum)
YOU HAD AN ENDOSCOPIC PROCEDURE TODAY: Refer to the procedure report and other information in the discharge instructions given to you for any specific questions about what was found during the examination. If this information does not answer your questions, please call [facility] office at [phone] to clarify.   YOU SHOULD EXPECT: Some feelings of bloating in the abdomen. Passage of more gas than usual. Walking can help get rid of the air that was put into your GI tract during the procedure and reduce the bloating. If you had a lower endoscopy (such as a colonoscopy or flexible sigmoidoscopy) you may notice spotting of blood in your stool or on the toilet paper. Some abdominal soreness may be present for a day or two, also.  DIET: Your first meal following the procedure should be a light meal and then it is ok to progress to your normal diet. A half-sandwich or bowl of soup is an example of a good first meal. Heavy or fried foods are harder to digest and may make you feel nauseous or bloated. Drink plenty of fluids but you should avoid alcoholic beverages for 24 hours. If you had a esophageal dilation, please see attached instructions for diet.    ACTIVITY: Your care partner should take you home directly after the procedure. You should plan to take it easy, moving slowly for the rest of the day. You can resume normal activity the day after the procedure however YOU SHOULD NOT DRIVE, use power tools, machinery or perform tasks that involve climbing or major physical exertion for 24 hours (because of the sedation medicines used during the test).   SYMPTOMS TO REPORT IMMEDIATELY: A gastroenterologist can be reached at any hour. Please call [phone]  for any of the following symptoms:  Following lower endoscopy (colonoscopy, flexible sigmoidoscopy) Excessive amounts of blood in the stool  Significant tenderness, worsening of abdominal pains  Swelling of the abdomen that is new, acute  Fever of 100  or higher  Following upper endoscopy (EGD, EUS, ERCP, esophageal dilation) Vomiting of blood or coffee ground material  New, significant abdominal pain  New, significant chest pain or pain under the shoulder blades  Painful or persistently difficult swallowing  New shortness of breath  Black, tarry-looking or red, bloody stools  FOLLOW UP:  If any biopsies were taken you will be contacted by phone or by letter within the next 1-3 weeks. Call [phone]  if you have not heard about the biopsies in 3 weeks.  Please also call with any specific questions about appointments or follow up tests. YOU HAD AN ENDOSCOPIC PROCEDURE TODAY: Refer to the procedure report and other information in the discharge instructions given to you for any specific questions about what was found during the examination. If this information does not answer your questions, please call [facility] office at [phone] to clarify.   YOU SHOULD EXPECT: Some feelings of bloating in the abdomen. Passage of more gas than usual. Walking can help get rid of the air that was put into your GI tract during the procedure and reduce the bloating. If you had a lower endoscopy (such as a colonoscopy or flexible sigmoidoscopy) you may notice spotting of blood in your stool or on the toilet paper. Some abdominal soreness may be present for a day or two, also.  DIET: Your first meal following the procedure should be a light meal and then it is ok to progress to your normal diet. A half-sandwich or bowl of soup is an   example of a good first meal. Heavy or fried foods are harder to digest and may make you feel nauseous or bloated. Drink plenty of fluids but you should avoid alcoholic beverages for 24 hours. If you had a esophageal dilation, please see attached instructions for diet.    ACTIVITY: Your care partner should take you home directly after the procedure. You should plan to take it easy, moving slowly for the rest of the day. You can resume  normal activity the day after the procedure however YOU SHOULD NOT DRIVE, use power tools, machinery or perform tasks that involve climbing or major physical exertion for 24 hours (because of the sedation medicines used during the test).   SYMPTOMS TO REPORT IMMEDIATELY: A gastroenterologist can be reached at any hour. Please call [phone]  for any of the following symptoms:   Following upper endoscopy (EGD, EUS, ERCP, esophageal dilation) Vomiting of blood or coffee ground material  New, significant abdominal pain  New, significant chest pain or pain under the shoulder blades  Painful or persistently difficult swallowing  New shortness of breath  Black, tarry-looking or red, bloody stools  FOLLOW UP:  If any biopsies were taken you will be contacted by phone or by letter within the next 1-3 weeks. Call [phone]  if you have not heard about the biopsies in 3 weeks.  Please also call with any specific questions about appointments or follow up tests.  Either keep appointment with Dr. Pati Gallo or switch to follow-up with me and call sooner if ongoing GI issues and okay with me to decrease Nexium to once a day call your cardiologist to see if you still need Eliquis or if they would be okay with just 1 baby aspirin a day

## 2022-03-06 ENCOUNTER — Encounter (HOSPITAL_COMMUNITY): Payer: Self-pay | Admitting: Gastroenterology

## 2022-03-08 NOTE — Progress Notes (Signed)
NEUROLOGY FOLLOW UP OFFICE NOTE  JANETTE MUSZYNSKI [id number]  Assessment/Plan:   Left sided occipital neuralgia/cervicogenic headache Cervical spondylosis Peripheral neuropathy - failed multiple medications (side effects) Restless leg syndrome - treated by PCP Hypertension     For occipital neuralgia, will continue duloxetine 30mg  daily.  We can increase to 60mg  daily if needed Follow up in 5 months.   Subjective:  Meiya B. Macklin is an 88 year old white female with HTN, PAF, chronic diastolic heart failure, DJD, hypothyroidism, polyneuropathy, and restless leg syndrome who follows up for headache.  She is accompanied by her husband who supplements history.   UPDATE: Current medications:  duloxetine 30mg  daily, pramipexole 0.375mg  2-3 hours prior to bedtime 6-7 (second dose earlier in evening/late afternoon ineffective)., lorazepam 0.5-1mg  QHS PRN (to help with sleep)    Started duloxetine last visit to treat headaches.  Headaches improved.      HISTORY: She has longstanding history of migraines when she was younger.  She was on magnesium and riboflavin for many years and migraines were controlled.  However she reports recurrence of headaches a couple of months ago.  It occurs daily.  It is left sided (same side as her previous migraines) involving he left temple and occipital region.  She reports "creepy crawly" feeling in the back of head.  Head sore to touch.  She has chronic neck pain.  Some photophobia but no nausea, phonophobia, or visual aura.  Sometimes wakes up in middle of night with it.  She takes Fioricet (which she has for years) and maybe lorazepam, which helps.   She has had a very gradual onset of lower extremity weakness beginning around 2016 or 2017.  She feels wobbly on her feet.  Over the past year, she no longer is able to walk up steps with her two feet.  She needs to pull herself, either grabbing the bannister or bending over and climbing up the steps on all fours.   Her legs ache.  She was evaluated by pain management for low back and left hip pain.  She has an MRI of the lumbar spine without contrast on 05/02/2018, which was personally reviewed, and showed multilevel lumbar spondylosis and degenerative endplate marrow edema at L1-L2 with left lateral disc bulge and spurring and mild left L1 foraminal stenosis as well as edema in the medial left psoas muscle at the L1-L2 level.  She was told surgery wasn't an option.       She presented to the ED on 04/07/2019 following a fall in which she struck the back of her head, sustaining a contusion.  CT of head and cervical spine personally reviewed showed no evidence of trauma or other acute abnormality.  No associated dizziness or passing out.  She also reports numbness and tingling involving her fingers and toes.  She feels shooting pain in the right ring finger and left middle finger.  She reports spurs in her neck but denies any significant neck pain.  No weakness in the upper extremities.  She has restless leg syndrome which has gotten worse over the past several months.  It feels like that her legs are jumping.  She needs to keep moving her legs for relief.  She is starting to notice it earlier in the evening while sitting and watching TV.  She was started on pramipexole 0.125mg  2 to 3 hours before bedtime which initially was helpful but then needed to increase to 0.25mg .  Labs from February 2021 showed B12 346,  folate 12.7, TSH 1.09, CK 54, aldolase 5.1, ferritin 65.  SPEP/IFE from 08/01/2019 showed polyclonal increase in IgA but no M-spike.  NCV-EMG of lower extremities on 07/24/2019 demonstrated a chronic sensorimotor axonal polyneuropathy.   Past medications:  Gabapentin (rash), Lyrica (rash), nortriptyline (elevated blood pressure)    PAST MEDICAL HISTORY: Past Medical History:  Diagnosis Date   CHF (congestive heart failure) (HCC)    pt. denies   Chronic diastolic heart failure ([facility]) [id number]   Complication of  anesthesia    COPD (chronic obstructive pulmonary disease) (HCC)    Diverticular disease    DJD (degenerative joint disease)    Dysphagia    Dysrhythmia    afib   GERD (gastroesophageal reflux disease)    Hiatal hernia    Hypertension    Hypothyroidism    Lower leg edema    Neuromuscular disorder (HCC)    neuropathy   PAF (paroxysmal atrial fibrillation) (HCC)    s/p ablation x several times now with occasional episodes of PAF.   She has failed sotolol/dofetilide/flecainide/propafenone/Amio and dronedarone either with not controlling her PAF or having side effects   Pneumonia    PONV (postoperative nausea and vomiting)    Restless leg syndrome    Seasonal allergies     MEDICATIONS: Current Outpatient Medications on File Prior to Visit  Medication Sig Dispense Refill   acetaminophen (TYLENOL) 500 MG tablet Take 1,000 mg by mouth every 8 (eight) hours as needed for moderate pain.     butalbital-acetaminophen-caffeine (FIORICET) 50-325-40 MG tablet Take 1 tablet by mouth 2 (two) times daily as needed for headache.     clotrimazole (FUNGICURE INTENSIVE/NAILGUARD) 1 % external solution Apply 1 Application topically 2 (two) times daily.     cyanocobalamin (VITAMIN B12) 1000 MCG tablet Take 1,000 mcg by mouth daily.     dextromethorphan (DELSYM) 30 MG/5ML liquid Take 60 mg by mouth as needed for cough.     DULoxetine (CYMBALTA) 30 MG capsule TAKE 1 CAPSULE (30 MG TOTAL) BY MOUTH DAILY. 30 capsule 0   ELIQUIS 5 MG TABS tablet TAKE 1 TABLET BY MOUTH 2 TIMES DAILY. 60 tablet 5   esomeprazole (NEXIUM) 20 MG capsule Take 20 mg by mouth daily at 12 noon.     esomeprazole (NEXIUM) 40 MG capsule Take 40 mg by mouth 2 (two) times daily before a meal.     estradiol (CLIMARA - DOSED IN MG/24 HR) 0.025 mg/24hr patch Place 0.025 mg onto the skin once a week.     furosemide (LASIX) 40 MG tablet TAKE 1 AND 1/2 TABLETS BY MOUTH DAILY. (Patient taking differently: Take 40 mg by mouth daily.) 135 tablet 1    LORazepam (ATIVAN) 1 MG tablet Take 0.5-1 mg by mouth at bedtime as needed for sleep. For sleep     MAGNESIUM PO Take 400 mg by mouth daily.     mupirocin ointment (BACTROBAN) 2 % Apply 1 application topically 2 (two) times daily as needed (wound care).     OVER THE COUNTER MEDICATION Apply 1 application topically at bedtime as needed (pain). [employer] pain relief cream     potassium chloride (KLOR-CON) 10 MEQ tablet TAKE 1 TABLET BY MOUTH DAILY 90 tablet 3   pramipexole (MIRAPEX) 1 MG tablet Take 1 mg by mouth at bedtime.     Riboflavin 400 MG CAPS Take 400 mg by mouth daily.     sennosides-docusate sodium (SENOKOT-S) 8.6-50 MG tablet Take 1 tablet by mouth daily as needed for constipation.  SYNTHROID 125 MCG tablet Take 125 mcg by mouth every morning.     valsartan (DIOVAN) 80 MG tablet Take 40 mg by mouth daily as needed (Take 40 mg if blood pressure is over 130).     verapamil (CALAN-SR) 120 MG CR tablet Take 120 mg by mouth daily as needed (Tachycardia).     Vitamin D, Cholecalciferol, 50 MCG (2000 UT) CAPS Take 2,000 Units by mouth daily.     No current facility-administered medications on file prior to visit.    ALLERGIES: Allergies  Allergen Reactions   Codeine Nausea And Vomiting and Nausea Only   Penicillins Hives    Has patient had a PCN reaction causing immediate rash, facial/tongue/throat swelling, SOB or lightheadedness with hypotension: No Has patient had a PCN reaction causing severe rash involving mucus membranes or skin necrosis: No Has patient had a PCN reaction that required hospitalization No Has patient had a PCN reaction occurring within the last 10 years: No If all of the above answers are "NO", then may proceed with Cephalosporin use.   Tetracycline Rash   Tramadol Nausea Only   Digoxin Other (See Comments)    Flushed neck and face   Digoxin And Related Other (See Comments)    ' Makes my face blood red"   Erythromycin Other (See Comments) and Hives     Flushing on face   Sulfa Antibiotics Other (See Comments)    PRESERVATIVE OF SOME FOODS- MAKES FLUSHES TO FACE AND NECK   Tetracyclines & Related Other (See Comments)    "Sores on legs"   Diltiazem Hcl Rash    Rash with long acting form   Lyrica [Pregabalin] Rash   Sulfites Rash    PRESERVATIVE OF SOME FOODS- MAKES FLUSHES TO FACE AND NECK PRESERVATIVE OF SOME FOODS- MAKES FLUSHES TO FACE AND NECK    FAMILY HISTORY: Family History  Problem Relation Age of Onset   Transient ischemic attack Mother    Heart failure Father    CVA Father    Lung cancer Father    Hypertension Sister    Cancer Brother    Sudden death Brother    Anesthesia problems Neg Hx    Hypotension Neg Hx    Malignant hyperthermia Neg Hx    Pseudochol deficiency Neg Hx    Heart attack Neg Hx       Objective:  Blood pressure (!) 144/74, pulse 89, height 5\' 8"  (1.727 m), weight 190 lb 6.4 oz (86.4 kg), SpO2 (!) 74 %.  General: No acute distress.  Patient appears well-groomed.     , DO  CC: Shon Millet, MD

## 2022-03-15 ENCOUNTER — Ambulatory Visit (INDEPENDENT_AMBULATORY_CARE_PROVIDER_SITE_OTHER): Payer: Medicare Other | Admitting: Neurology

## 2022-03-15 ENCOUNTER — Encounter: Payer: Self-pay | Admitting: Neurology

## 2022-03-15 VITALS — BP 144/74 | HR 89 | Ht 68.0 in | Wt 190.4 lb

## 2022-03-15 DIAGNOSIS — M5481 Occipital neuralgia: Secondary | ICD-10-CM | POA: Diagnosis not present

## 2022-03-15 NOTE — Patient Instructions (Signed)
Continue duloxetine 30mg  daily Follow up in 5 months.

## 2022-03-18 DIAGNOSIS — K449 Diaphragmatic hernia without obstruction or gangrene: Secondary | ICD-10-CM | POA: Diagnosis not present

## 2022-03-26 ENCOUNTER — Other Ambulatory Visit: Payer: Self-pay | Admitting: Neurology

## 2022-04-05 ENCOUNTER — Encounter: Payer: Self-pay | Admitting: Internal Medicine

## 2022-04-05 ENCOUNTER — Ambulatory Visit: Payer: Medicare Other | Attending: Internal Medicine | Admitting: Internal Medicine

## 2022-04-05 VITALS — BP 140/84 | HR 72 | Ht 68.5 in | Wt 189.2 lb

## 2022-04-05 DIAGNOSIS — I5032 Chronic diastolic (congestive) heart failure: Secondary | ICD-10-CM | POA: Insufficient documentation

## 2022-04-05 DIAGNOSIS — I10 Essential (primary) hypertension: Secondary | ICD-10-CM | POA: Insufficient documentation

## 2022-04-05 DIAGNOSIS — I4819 Other persistent atrial fibrillation: Secondary | ICD-10-CM | POA: Diagnosis not present

## 2022-04-05 NOTE — Patient Instructions (Signed)

## 2022-04-05 NOTE — Progress Notes (Signed)
HPI  [name] returns today for follow-up of paroxysmal atrial fibrillation, and hypertension. She is status post ablation remotely twice. She was placed on amiodarone secondary to recurrent atrial fibrillation and flutter. This was discontinued secondary to severe fatigue and malaise. She has developed worsening musculoskeletal pain and is considering knee replacement surgery. She has not fallen. She denies syncope or chest pain. Minimal shortness of breath except when she goes out of rhythm. She had recurrent atrial fib but reverted back to NSR spontaneously. She describes an episode of aspiration which caused enough coughing to tear the esophagus and result in a GI bleed and pneumonia. She is better. She did not have any atrial fib.     Current Outpatient Medications  Medication Sig Dispense Refill   acetaminophen (TYLENOL) 500 MG tablet Take 1,000 mg by mouth every 8 (eight) hours as needed for moderate pain.     butalbital-acetaminophen-caffeine (FIORICET) 50-325-40 MG tablet Take 1 tablet by mouth 2 (two) times daily as needed for headache.     clotrimazole (FUNGICURE INTENSIVE/NAILGUARD) 1 % external solution Apply 1 Application topically 2 (two) times daily.     cyanocobalamin (VITAMIN B12) 1000 MCG tablet Take 1,000 mcg by mouth daily.     dextromethorphan (DELSYM) 30 MG/5ML liquid Take 60 mg by mouth as needed for cough.     DULoxetine (CYMBALTA) 30 MG capsule TAKE 1 CAPSULE BY MOUTH DAILY. 30 capsule 0   ELIQUIS 5 MG TABS tablet TAKE 1 TABLET BY MOUTH 2 TIMES DAILY. 60 tablet 5   esomeprazole (NEXIUM) 20 MG capsule Take 20 mg by mouth daily at 12 noon.     estradiol (CLIMARA - DOSED IN MG/24 HR) 0.025 mg/24hr patch Place 0.025 mg onto the skin once a week.     furosemide (LASIX) 40 MG tablet TAKE 1 AND 1/2 TABLETS BY MOUTH DAILY. (Patient taking differently: Take 40 mg by mouth daily.) 135 tablet 1   LORazepam (ATIVAN) 1 MG tablet Take 0.5-1 mg by mouth at bedtime as needed  for sleep. For sleep     MAGNESIUM PO Take 400 mg by mouth daily.     mupirocin ointment (BACTROBAN) 2 % Apply 1 application topically 2 (two) times daily as needed (wound care).     OVER THE COUNTER MEDICATION Apply 1 application topically at bedtime as needed (pain). [employer] pain relief cream     potassium chloride (KLOR-CON) 10 MEQ tablet TAKE 1 TABLET BY MOUTH DAILY 90 tablet 3   pramipexole (MIRAPEX) 1 MG tablet Take 1 mg by mouth at bedtime.     Riboflavin 400 MG CAPS Take 400 mg by mouth daily.     sennosides-docusate sodium (SENOKOT-S) 8.6-50 MG tablet Take 1 tablet by mouth daily as needed for constipation.     SYNTHROID 125 MCG tablet Take 125 mcg by mouth every morning.     valsartan (DIOVAN) 80 MG tablet Take 40 mg by mouth daily as needed (Take 40 mg if blood pressure is over 130).     verapamil (CALAN-SR) 120 MG CR tablet Take 120 mg by mouth daily as needed (Tachycardia).     Vitamin D, Cholecalciferol, 50 MCG (2000 UT) CAPS Take 2,000 Units by mouth daily.     No current facility-administered medications for this visit.     Past Medical History:  Diagnosis Date   CHF (congestive heart failure) (HCC)    pt. denies   Chronic diastolic heart failure (HCC) 04/22/2015   Complication of anesthesia  COPD (chronic obstructive pulmonary disease) (HCC)    Diverticular disease    DJD (degenerative joint disease)    Dysphagia    Dysrhythmia    afib   GERD (gastroesophageal reflux disease)    Hiatal hernia    Hypertension    Hypothyroidism    Lower leg edema    Neuromuscular disorder (HCC)    neuropathy   PAF (paroxysmal atrial fibrillation) (HCC)    s/p ablation x several times now with occasional episodes of PAF.   She has failed sotolol/dofetilide/flecainide/propafenone/Amio and dronedarone either with not controlling her PAF or having side effects   Pneumonia    PONV (postoperative nausea and vomiting)    Restless leg syndrome    Seasonal allergies     ROS:    All systems reviewed and negative except as noted in the HPI.   Past Surgical History:  Procedure Laterality Date   ABDOMINAL HYSTERECTOMY     APPENDECTOMY     ATRIAL FIBRILLATION ABLATION  09/06/2013   2'14 -[facility]   BALLOON DILATION  03/03/2011   Procedure: Marvis Repress DILATION;  Surgeon: Charolett Bumpers, MD;  Location: [facility];  Service: Endoscopy;  Laterality: N/A;   bladder polyps     BREAST EXCISIONAL BIOPSY Left    BREAST SURGERY     left lumpectomy   CARDIOVERSION     x3   CARDIOVERSION N/A 12/30/2014   Procedure: CARDIOVERSION;  Surgeon: Lewayne Bunting, MD;  Location: [facility] ENDOSCOPY;  Service: Cardiovascular;  Laterality: N/A;   CARDIOVERSION N/A 04/23/2015   Procedure: CARDIOVERSION;  Surgeon: Thurmon Fair, MD;  Location: [facility] ENDOSCOPY;  Service: Cardiovascular;  Laterality: N/A;   CATARACT EXTRACTION Left    COLONOSCOPY WITH PROPOFOL N/A 09/24/2013   Procedure: COLONOSCOPY WITH PROPOFOL;  Surgeon: Charolett Bumpers, MD;  Location: [facility];  Service: Endoscopy;  Laterality: N/A;   ESOPHAGOGASTRODUODENOSCOPY  03/03/2011   Procedure: ESOPHAGOGASTRODUODENOSCOPY (EGD);  Surgeon: Charolett Bumpers, MD;  Location: [facility]COPY;  Service: Endoscopy;  Laterality: N/A;   ESOPHAGOGASTRODUODENOSCOPY (EGD) WITH PROPOFOL Bilateral 03/04/2022   Procedure: ESOPHAGOGASTRODUODENOSCOPY (EGD) WITH PROPOFOL;  Surgeon: Vida Rigger, MD;  Location: [facility] ENDOS[facility];  Service: Gastroenterology;  Laterality: Bilateral;   EYE SURGERY     cataracat   RHINOPLASTY     RIGHT HEART CATHETERIZATION N/A 07/11/2014   Procedure: RIGHT HEART CATH;  Surgeon: Laurey Morale, MD;  Location: [facility] CATH LAB;  Service: Cardiovascular;  Laterality: N/A;   ROTATOR CUFF REPAIR     THYROIDECTOMY  1973   TONSILLECTOMY  1942   TOTAL KNEE ARTHROPLASTY Right 10/28/2020   Procedure: TOTAL KNEE ARTHROPLASTY;  Surgeon: Eugenia Mcalpine, MD;  Location: [facility] ORS;  Service: Orthopedics;  Laterality: Right;  with adductor  canal   TUBAL LIGATION       Family History  Problem Relation Age of Onset   Transient ischemic attack Mother    Heart failure Father    CVA Father    Lung cancer Father    Hypertension Sister    Cancer Brother    Sudden death Brother    Anesthesia problems Neg Hx    Hypotension Neg Hx    Malignant hyperthermia Neg Hx    Pseudochol deficiency Neg Hx    Heart attack Neg Hx      Social History   Socioeconomic History   Marital status: Married    Spouse name: Not on file   Number of children: Not on file   Years of education: Not on file  Highest education level: Not on file  Occupational History   Occupation: retired  Tobacco Use   Smoking status: Never    Passive exposure: Past   Smokeless tobacco: Never  Vaping Use   Vaping Use: Never used  Substance and Sexual Activity   Alcohol use: Not Currently    Comment: rare wine   Drug use: No   Sexual activity: Not Currently    Birth control/protection: Post-menopausal  Other Topics Concern   Not on file  Social History Narrative   Right handed   Two story home   Drinks caffeine   Social Determinants of Health   Financial Resource Strain: Not on file  Food Insecurity: Not on file  Transportation Needs: Not on file  Physical Activity: Not on file  Stress: Not on file  Social Connections: Not on file  Intimate Partner Violence: Not on file     BP (!) 140/84   Pulse 72   Ht 5' 8.5" (1.74 m)   Wt 189 lb 3.2 oz (85.8 kg)   SpO2 97%   BMI 28.35 kg/m   Physical Exam:  Well appearing NAD HEENT: Unremarkable Neck:  No JVD, no thyromegally Lymphatics:  No adenopathy Back:  No CVA tenderness Lungs:  Clear HEART:  Regular rate rhythm, no murmurs, no rubs, no clicks Abd:  soft, positive bowel sounds, no organomegally, no rebound, no guarding Ext:  2 plus pulses, no edema, no cyanosis, no clubbing Skin:  No rashes no nodules Neuro:  CN II through XII intact, motor grossly intact  Assess/Plan:  PAF -  she is maintaining NSR. No change in meds. 2. HTN - her bp is well controlled.  3. Diastolic heart failure - her symptoms are class 2A when she is in rhythm and 3B in atrial fib. Continue current treatment. 4. Coags - she has not had any bleeding on systemic anti-coagulation.   Sharlot Gowda Cora Brierley,MD

## 2022-05-05 ENCOUNTER — Other Ambulatory Visit: Payer: Self-pay | Admitting: Neurology

## 2022-05-05 ENCOUNTER — Other Ambulatory Visit: Payer: Self-pay | Admitting: Internal Medicine

## 2022-05-13 ENCOUNTER — Ambulatory Visit: Payer: Medicare Other | Attending: Cardiology | Admitting: Cardiology

## 2022-05-13 ENCOUNTER — Telehealth: Payer: Self-pay

## 2022-05-13 ENCOUNTER — Encounter: Payer: Self-pay | Admitting: Cardiology

## 2022-05-13 VITALS — BP 127/69 | HR 81 | Temp 97.2°F | Ht 68.5 in | Wt 175.0 lb

## 2022-05-13 DIAGNOSIS — I5032 Chronic diastolic (congestive) heart failure: Secondary | ICD-10-CM | POA: Diagnosis not present

## 2022-05-13 DIAGNOSIS — I10 Essential (primary) hypertension: Secondary | ICD-10-CM

## 2022-05-13 DIAGNOSIS — I480 Paroxysmal atrial fibrillation: Secondary | ICD-10-CM | POA: Diagnosis not present

## 2022-05-13 NOTE — Patient Instructions (Signed)
Medication Instructions:  Your physician recommends that you continue on your current medications as directed. Please refer to the Current Medication list given to you today.  *If you need a refill on your cardiac medications before your next appointment, please call your pharmacy*   Lab Work: None.  If you have labs (blood work) drawn today and your tests are completely normal, you will receive your results only by: MyChart Message (if you have MyChart) OR A paper copy in the mail If you have any lab test that is abnormal or we need to change your treatment, we will call you to review the results.   Testing/Procedures: None.   Follow-Up: At [facility], you and your health needs are our priority.  As part of our continuing mission to provide you with exceptional heart care, we have created designated Provider Care Teams.  These Care Teams include your primary Cardiologist (physician) and Advanced Practice Providers (APPs -  Physician Assistants and Nurse Practitioners) who all work together to provide you with the care you need, when you need it.  We recommend signing up for the patient portal called "MyChart".  Sign up information is provided on this After Visit Summary.  MyChart is used to connect with patients for Virtual Visits (Telemedicine).  Patients are able to view lab/test results, encounter notes, upcoming appointments, etc.  Non-urgent messages can be sent to your provider as well.   To learn more about what you can do with MyChart, go to [email or web address].    Your next appointment:   1 year(s)  Provider:   Traci Turner, MD     

## 2022-05-13 NOTE — Telephone Encounter (Signed)
Patient requesting in-person office visit be changed to virtual visit. Appointment type changed, link and consent sent. Advised patient to have medication list as well as recent BP, HR and weight ready for visit. Patient verbalizes understanding.

## 2022-05-13 NOTE — Progress Notes (Signed)
Virtual Visit via Video Note   Because of Natasha [name]'s co-morbid illnesses, she is at least at moderate risk for complications without adequate follow up.  This format is felt to be most appropriate for this patient at this time.  All issues noted in this document were discussed and addressed.  A limited physical exam was performed with this format.  Please refer to the patient's chart for her consent to telehealth for [facility].   Date:  05/13/2022   ID:  Natasha [name], DOB 1933/04/11, MRN [id number] The patient was identified using 2 identifiers.  Patient Location: Home Provider Location: Office/Clinic   PCP:  Charlane Ferretti, MD   [facility] Prov[facility] Cardiologist:  Fransico Him, MD Electrophysiologist:  Cristopher Peru, MD     Evaluation Performed:  Follow-Up Visit  Chief Complaint:  atrial fibrillation, HTN  History of Present Illness:    Natasha [name] is a 89 y.o. female with a hx of persistent atrial fibrillation with 2 failed ablations at [facility] and multiple antiarrythmic intolerances (Multaq , Rythmol, flecaide, tikosyn, and pt refused amiodarone) , HTN, hypothyroidism, mild pulmonary hypertension (54mmHg) and chronic diastolic dysfunction.     She has been seen at [facility] as well as in our office by Dr. Lovena Le with EP who recommended 3 options-->continue current course and prn bystolic 5 mg, begin amiodarone ( which she does not wish to do)  and AV node ablaltion with PPM. She chose to continue current meds with  verapmil, bystolic and prn bystolic. She was seen back by Dr. Lovena Le and was having fatigue that was felt to possibly be due to her afib meds and it was recommended that her evening dose of Bystolic be stopped but she decided not to stop it.   Ultimately, Amio was stopped due to side effects.    She was seen by Dr. Lovena Le in December 2023 and was doing well.  She was maintaining normal sinus rhythm.  She does have chronic diastolic CHF and was felt to  be class IIa but does get very symptomatic when she goes into A-fib.  No changes were made to her medications at that time.  She is here today for followup and is doing well.  She denies any chest pain or pressure, SOB, DOE, PND, orthopnea, LE edema, dizziness, palpitations or syncope. She is having balance problems and walks with a rollator.  She is compliant with her meds and is tolerating meds with no SE.    Past Medical History:  Diagnosis Date   CHF (congestive heart failure) (HCC)    pt. denies   Chronic diastolic heart failure ([place]) 85/05/7739   Complication of anesthesia    COPD (chronic obstructive pulmonary disease) (HCC)    Diverticular disease    DJD (degenerative joint disease)    Dysphagia    Dysrhythmia    afib   GERD (gastroesophageal reflux disease)    Hiatal hernia    Hypertension    Hypothyroidism    Lower leg edema    Neuromuscular disorder (HCC)    neuropathy   PAF (paroxysmal atrial fibrillation) (HCC)    s/p ablation x several times now with occasional episodes of PAF.   She has failed sotolol/dofetilide/flecainide/propafenone/Amio and dronedarone either with not controlling her PAF or having side effects   Pneumonia    PONV (postoperative nausea and vomiting)    Restless leg syndrome    Seasonal allergies    Past Surgical History:  Procedure Laterality  Date   ABDOMINAL HYSTERECTOMY     APPENDECTOMY     ATRIAL FIBRILLATION ABLATION  09/06/2013   2'14 -[facility]   BALLOON DILATION  03/03/2011   Procedure: Marvis Repress DILATION;  Surgeon: Charolett Bumpers, MD;  Location: [facility]OS[facility];  Service: Endoscopy;  Laterality: N/A;   bladder polyps     BREAST EXCISIONAL BIOPSY Left    BREAST SURGERY     left lumpectomy   CARDIOVERSION     x3   CARDIOVERSION N/A 12/30/2014   Procedure: CARDIOVERSION;  Surgeon: Lewayne Bunting, MD;  Location: [facility] ENDOSCOPY;  Service: Cardiovascular;  Laterality: N/A;   CARDIOVERSION N/A 04/23/2015   Procedure: CARDIOVERSION;   Surgeon: Thurmon Fair, MD;  Location: [facility] ENDOSCOPY;  Service: Cardiovascular;  Laterality: N/A;   CATARACT EXTRACTION Left    COLONOSCOPY WITH PROPOFOL N/A 09/24/2013   Procedure: COLONOSCOPY WITH PROPOFOL;  Surgeon: Charolett Bumpers, MD;  Location: [facility];  Service: Endoscopy;  Laterality: N/A;   ESOPHAGOGASTRODUODENOSCOPY  03/03/2011   Procedure: ESOPHAGOGASTRODUODENOSCOPY (EGD);  Surgeon: Charolett Bumpers, MD;  Location: [facility] ENDOSCOPY;  Service: Endoscopy;  Laterality: N/A;   ESOPHAGOGASTRODUODENOSCOPY (EGD) WITH PROPOFOL Bilateral 03/04/2022   Procedure: ESOPHAGOGASTRODUODENOSCOPY (EGD) WITH PROPOFOL;  Surgeon: Vida Rigger, MD;  Location: [facility];  Service: Gastroenterology;  Laterality: Bilateral;   EYE SURGERY     cataracat   RHINOPLASTY     RIGHT HEART CATHETERIZATION N/A 07/11/2014   Procedure: RIGHT HEART CATH;  Surgeon: Laurey Morale, MD;  Location: [facility] CATH LAB;  Service: Cardiovascular;  Laterality: N/A;   ROTATOR CUFF REPAIR     THYROIDECTOMY  1973   TONSILLECTOMY  1942   TOTAL KNEE ARTHROPLASTY Right 10/28/2020   Procedure: TOTAL KNEE ARTHROPLASTY;  Surgeon: Eugenia Mcalpine, MD;  Location: [facility];  Service: Orthopedics;  Laterality: Right;  with adductor canal   TUBAL LIGATION       No outpatient medications have been marked as taking for the 05/13/22 encounter (Video Visit) with Quintella Reichert, MD.     Allergies:   Codeine, Penicillins, Tetracycline, Tramadol, Digoxin, Digoxin and related, Erythromycin, Sulfa antibiotics, Tetracyclines & related, Diltiazem hcl, Lyrica [pregabalin], and Sulfites   Social History   Tobacco Use   Smoking status: Never    Passive exposure: Past   Smokeless tobacco: Never  Vaping Use   Vaping Use: Never used  Substance Use Topics   Alcohol use: Not Currently    Comment: rare wine   Drug use: No     Family Hx: The patient's family history includes CVA in her father; Cancer in her brother; Heart failure in her father;  Hypertension in her sister; Lung cancer in her father; Sudden death in her brother; Transient ischemic attack in her mother. There is no history of Anesthesia problems, Hypotension, Malignant hyperthermia, Pseudochol deficiency, or Heart attack.  ROS:   Please see the history of present illness.     All other systems reviewed and are negative.   Prior CV studies:   The following studies were reviewed today:  None  Labs/Other Tests and Data Reviewed:    EKG: not performed  Recent Labs: No results found for requested labs within last 365 days.   Recent Lipid Panel No results found for: "CHOL", "TRIG", "HDL", "CHOLHDL", "LDLCALC", "LDLDIRECT"  Wt Readings from Last 3 Encounters:  05/13/22 175 lb (79.4 kg)  04/05/22 189 lb 3.2 oz (85.8 kg)  03/15/22 190 lb 6.4 oz (86.4 kg)     Risk Assessment/Calculations:  CHA2DS2-VASc Score = 5   This indicates a 7.2% annual risk of stroke. The patient's score is based upon: CHF History: 1 HTN History: 1 Diabetes History: 0 Stroke History: 0 Vascular Disease History: 0 Age Score: 2 Gender Score: 1         Objective:    Vital Signs:  BP 127/69   Pulse 81   Temp (!) 97.2 F (36.2 C)   Ht 5' 8.5" (1.74 m)   Wt 175 lb (79.4 kg)   SpO2 98%   BMI 26.22 kg/m    VITAL SIGNS:  reviewed GEN:  no acute distress EYES:  sclerae anicteric, EOMI - Extraocular Movements Intact RESPIRATORY:  normal respiratory effort, symmetric expansion CARDIOVASCULAR:  no peripheral edema SKIN:  no rash, lesions or ulcers. MUSCULOSKELETAL:  no obvious deformities. NEURO:  alert and oriented x 3, no obvious focal deficit PSYCH:  normal affect  ASSESSMENT & PLAN:    1.  Chronic diastolic CHF -She has not had any shortness of breath or lower extremity edema -She is NYHA class IIa -Continue prescription drug managed with Lasix 60 mg daily with as needed refills -I have personally reviewed and interpreted outside labs performed by patient's PCP  which showed serum creatinine 0.61 and potassium 4.3 on 08/23/2021   2.  Hypertension -BP is controlled at home -Continue with drug management with Diovan 40 mg daily >> she takes it daily unless her BP starts to get low  3.  Persistent atrial fibrillation -she denies any palpitations and no bleeding problems on DOAC -Amio, BB and CCB stopped due to side effects -Continue prescription drug with Eliquis 5 mg twice daily and verapamil SR 120 mg daily as needed for breakthrough palpitations with as needed refills (she has not had to take any Verapamil recently) -I have personally reviewed and interpreted outside labs performed by patient's PCP which showed hemoglobin 11.7 on 02/23/2022     Time:   Today, I have spent 15 minutes with the patient with telehealth technology discussing the above problems.     Medication Adjustments/Labs and Tests Ordered: Current medicines are reviewed at length with the patient today.  Concerns regarding medicines are outlined above.   Tests Ordered: No orders of the defined types were placed in this encounter.   Medication Changes: No orders of the defined types were placed in this encounter.   Follow Up:  In Person in 1 year(s)  Signed, Fransico Him, MD  05/13/2022 1:15 PM    [facility]

## 2022-05-24 DIAGNOSIS — I5032 Chronic diastolic (congestive) heart failure: Secondary | ICD-10-CM | POA: Diagnosis not present

## 2022-05-24 DIAGNOSIS — J449 Chronic obstructive pulmonary disease, unspecified: Secondary | ICD-10-CM | POA: Diagnosis not present

## 2022-05-24 DIAGNOSIS — I10 Essential (primary) hypertension: Secondary | ICD-10-CM | POA: Diagnosis not present

## 2022-05-24 DIAGNOSIS — I4891 Unspecified atrial fibrillation: Secondary | ICD-10-CM | POA: Diagnosis not present

## 2022-05-24 DIAGNOSIS — M170 Bilateral primary osteoarthritis of knee: Secondary | ICD-10-CM | POA: Diagnosis not present

## 2022-05-24 DIAGNOSIS — K219 Gastro-esophageal reflux disease without esophagitis: Secondary | ICD-10-CM | POA: Diagnosis not present

## 2022-05-24 DIAGNOSIS — E039 Hypothyroidism, unspecified: Secondary | ICD-10-CM | POA: Diagnosis not present

## 2022-06-06 ENCOUNTER — Other Ambulatory Visit: Payer: Self-pay | Admitting: Internal Medicine

## 2022-06-06 DIAGNOSIS — I4819 Other persistent atrial fibrillation: Secondary | ICD-10-CM

## 2022-06-06 NOTE — Telephone Encounter (Signed)
Eliquis 72m refill request received. Patient is 89years old, weight-79.4kg, Crea-0.61 [place]23[place] [facility] [place] and last seen by Dr. TRadford Paxvia TFunkon 05/13/22. Dose is appropriate based on dosing criteria. Will send in refill to requested pharmacy.

## 2022-07-13 DIAGNOSIS — H04123 Dry eye syndrome of bilateral lacrimal glands: Secondary | ICD-10-CM | POA: Diagnosis not present

## 2022-07-13 DIAGNOSIS — Z961 Presence of intraocular lens: Secondary | ICD-10-CM | POA: Diagnosis not present

## 2022-07-13 IMAGING — MG MM DIGITAL SCREENING BILAT W/ TOMO AND CAD
8 series · 9 of 24 positions shown · non-contrast
Comparison: Previous exam(s).

CLINICAL DATA: Screening.

EXAM:
DIGITAL SCREENING BILATERAL MAMMOGRAM WITH TOMOSYNTHESIS AND CAD
TECHNIQUE: Bilateral screening digital craniocaudal and mediolateral oblique
mammograms were obtained. Bilateral screening digital breast
tomosynthesis was performed. The images were evaluated with
computer-aided detection.

[L MLO synth-2D]
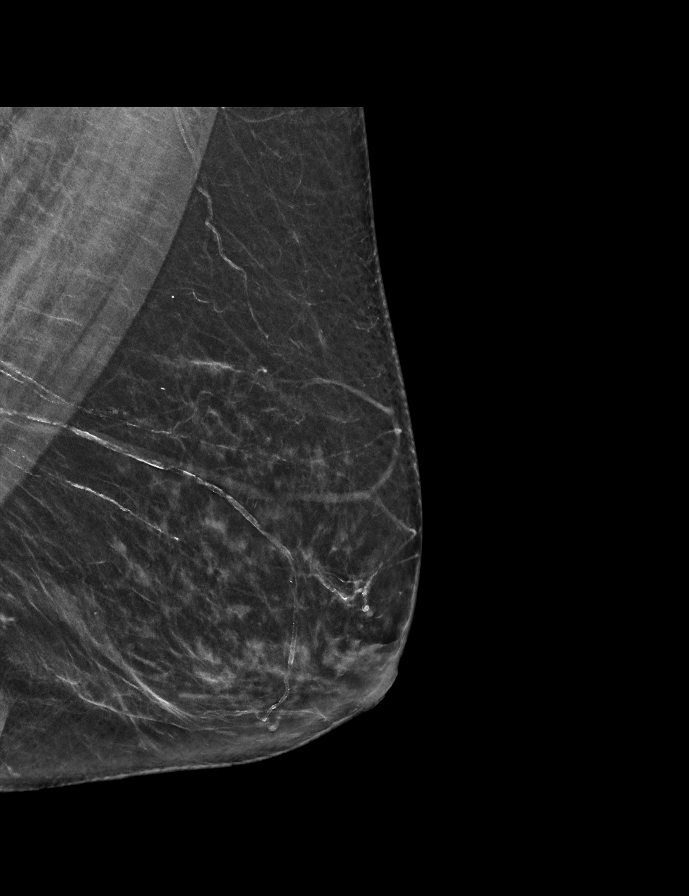

[R CC synth-2D]
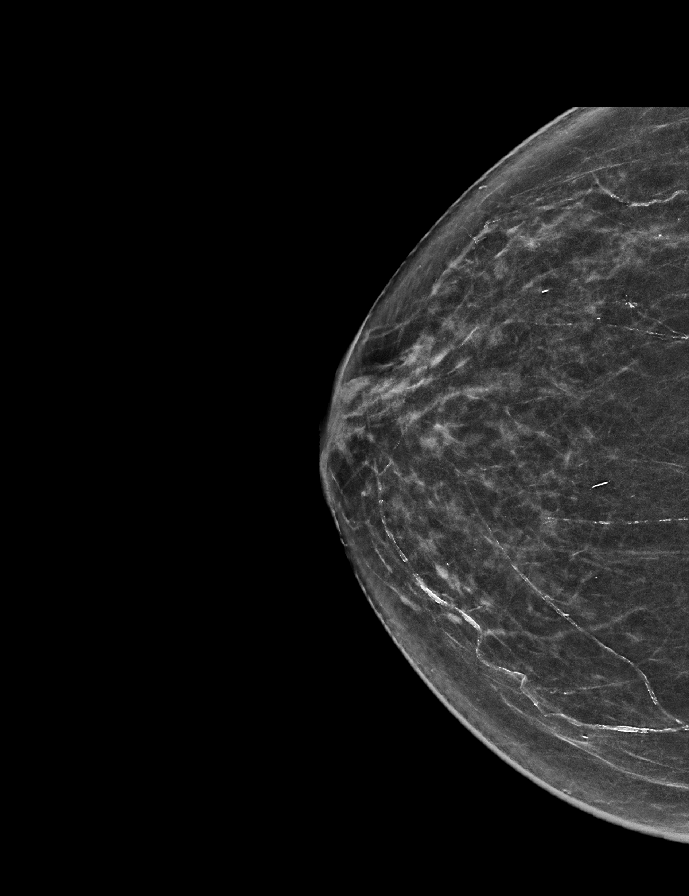

[R MLO synth-2D]
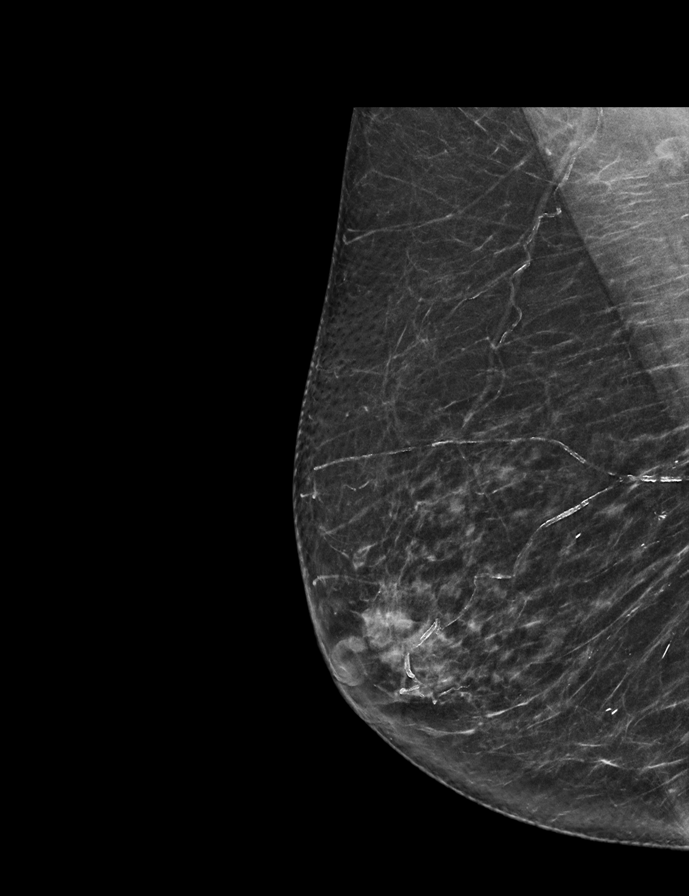

[L CC synth-2D]
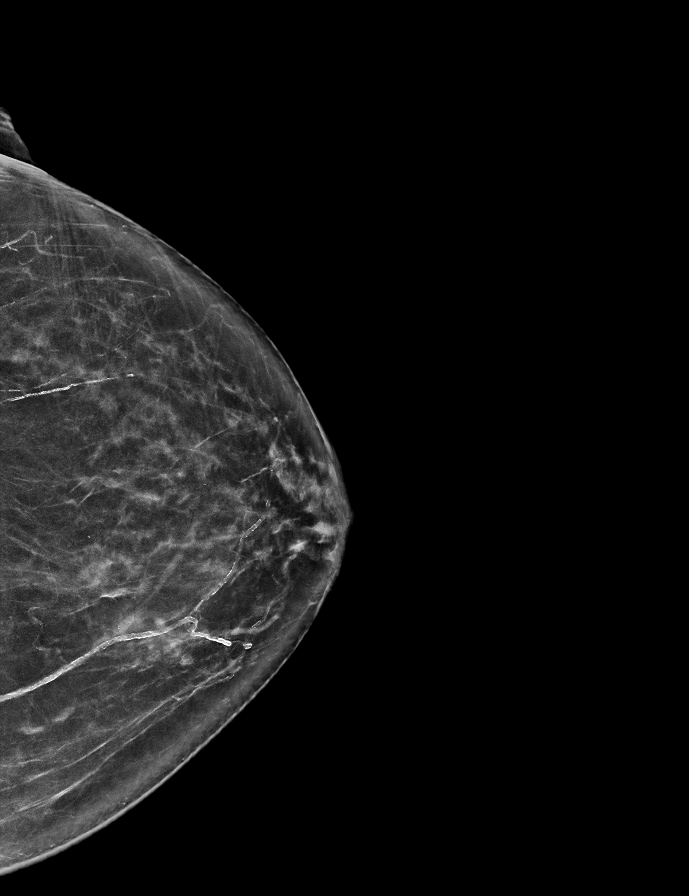

[L MLO tomo · 2 of 61 frames shown]
[frame 20/61]
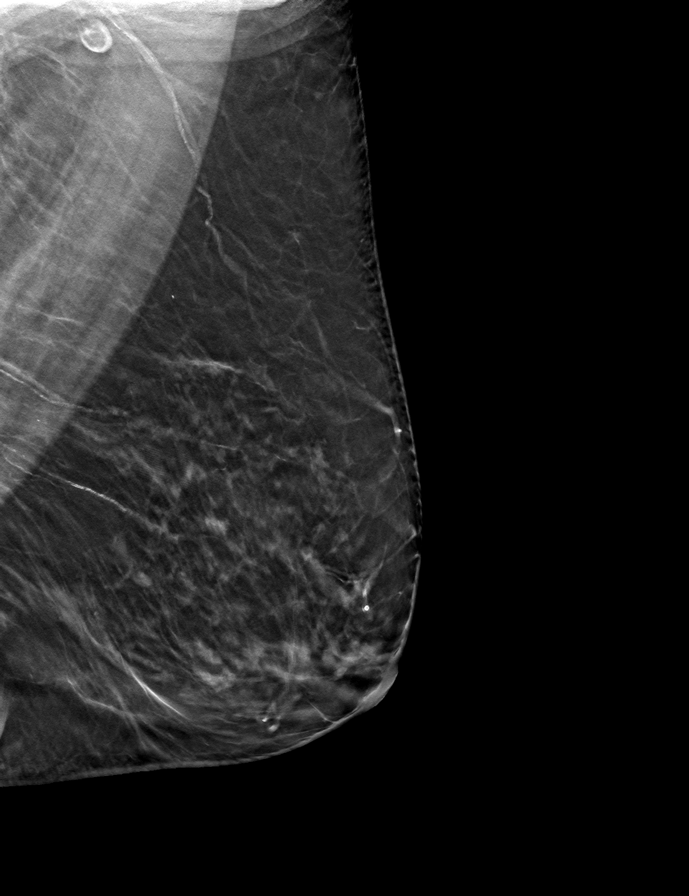
[frame 31/61]
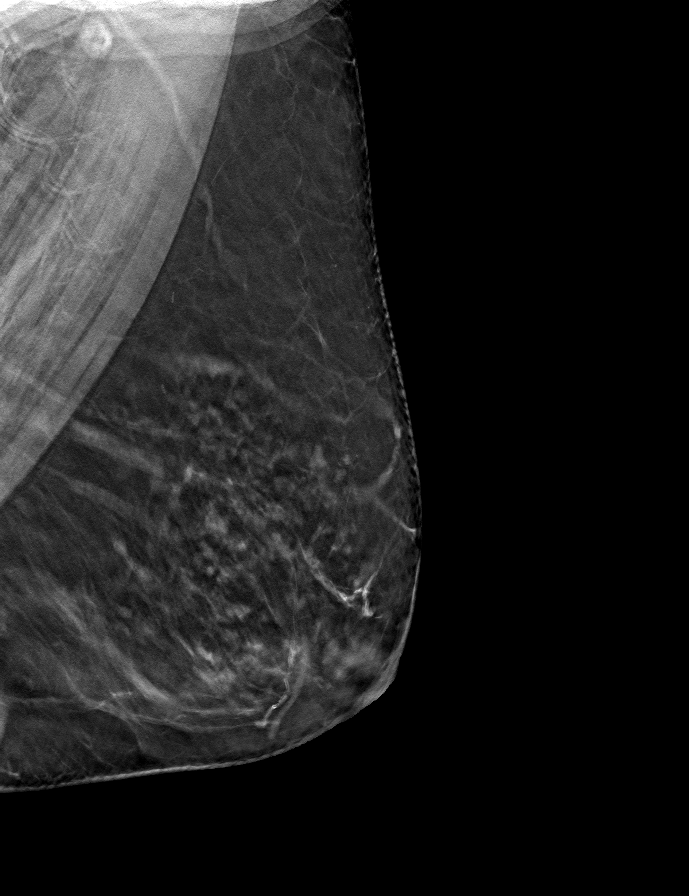

[L CC tomo · tomo slice 31/62.0]
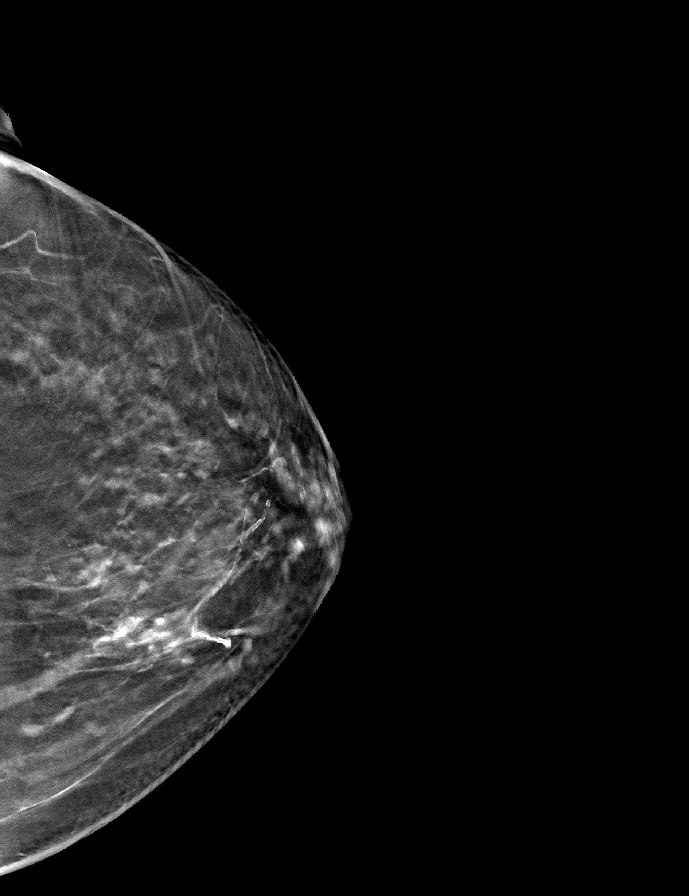

[R MLO tomo · tomo slice 33/65.0]
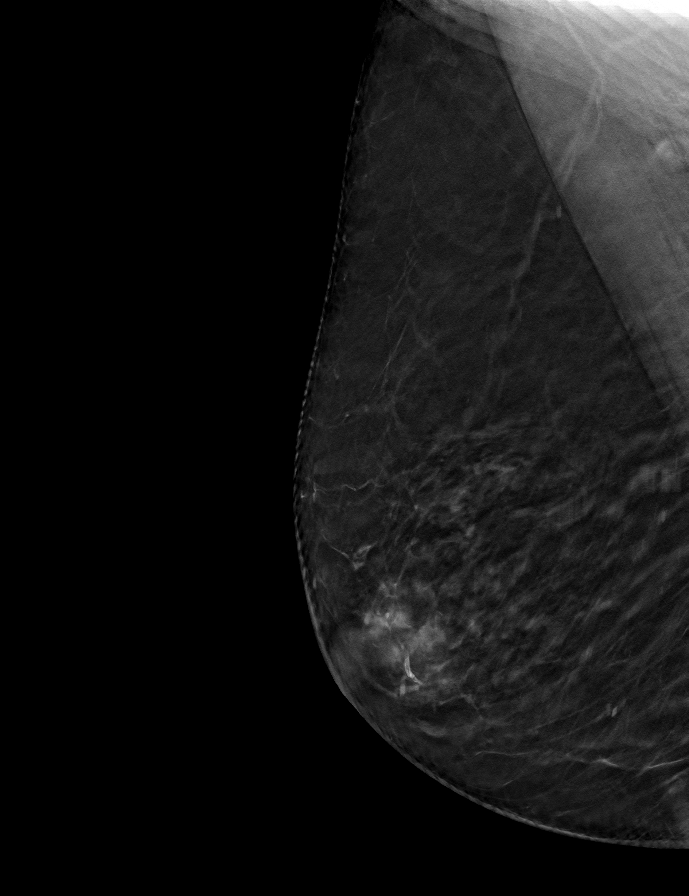

[R CC tomo · tomo slice 31/61.0]
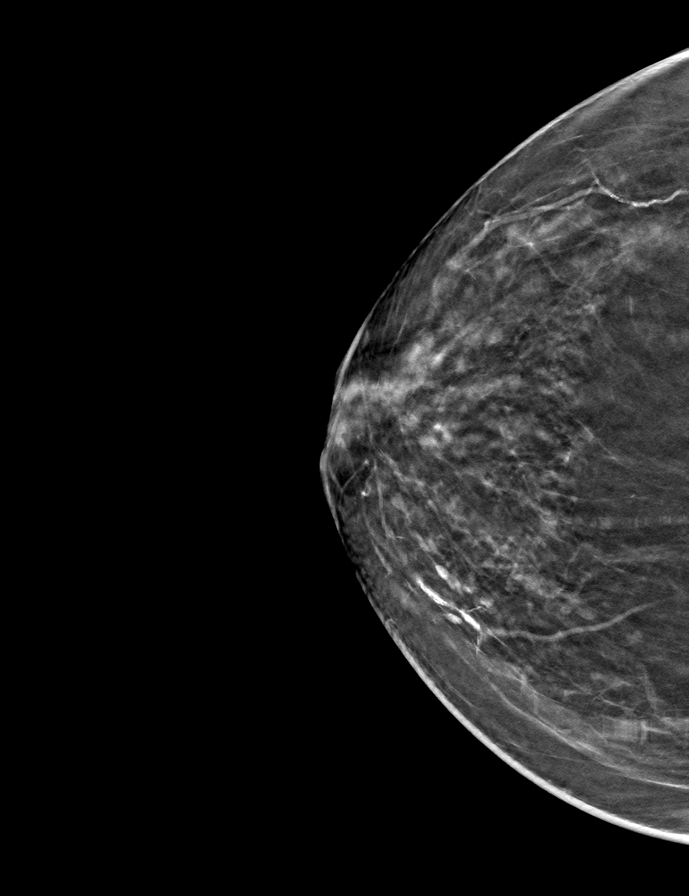

[9 of 24 positions shown; findings below may reference images not displayed]

ACR Breast Density Category b: There are scattered areas of
fibroglandular density.
FINDINGS: There are no findings suspicious for malignancy.
IMPRESSION: No mammographic evidence of malignancy. A result letter of this
screening mammogram will be mailed directly to the patient.

RECOMMENDATION:
Screening mammogram in one year. (Code:51-O-LD2)

BI-RADS CATEGORY  1: Negative.

## 2022-08-05 DIAGNOSIS — T148XXA Other injury of unspecified body region, initial encounter: Secondary | ICD-10-CM | POA: Diagnosis not present

## 2022-08-05 DIAGNOSIS — B351 Tinea unguium: Secondary | ICD-10-CM | POA: Diagnosis not present

## 2022-08-12 NOTE — Progress Notes (Unsigned)
NEUROLOGY FOLLOW UP OFFICE NOTE  Natasha [name] [id number]  Assessment/Plan:   Left sided occipital neuralgia/cervicogenic headache Cervical spondylosis Peripheral neuropathy - failed multiple medications (side effects) Restless leg syndrome Hypertension     For occipital neuralgia, will continue duloxetine 30mg  daily.  We can increase to 60mg  daily if needed Follow up in 5 months.   Subjective:  Natasha [name] is an 89 year old white female with HTN, PAF, chronic diastolic heart failure, DJD, hypothyroidism, polyneuropathy, and restless leg syndrome who follows up for headache.  She is accompanied by her husband who supplements history.   UPDATE: Current medications:  duloxetine 30mg  daily, pramipexole 0.375mg  2-3 hours prior to bedtime 6-7 (second dose earlier in evening/late afternoon ineffective)., lorazepam 0.5-1mg  QHS PRN (to help with sleep)    Started duloxetine last visit to treat headaches.  Headaches improved.       HISTORY: She has longstanding history of migraines when she was younger.  She was on magnesium and riboflavin for many years and migraines were controlled.  However she reports recurrence of headaches a couple of months ago.  It occurs daily.  It is left sided (same side as her previous migraines) involving he left temple and occipital region.  She reports "creepy crawly" feeling in the back of head.  Head sore to touch.  She has chronic neck pain.  Some photophobia but no nausea, phonophobia, or visual aura.  Sometimes wakes up in middle of night with it.  She takes Fioricet (which she has for years) and maybe lorazepam, which helps.    She has had a very gradual onset of lower extremity weakness beginning around 2016 or 2017.  She feels wobbly on her feet.  Over the past year, she no longer is able to walk up steps with her two feet.  She needs to pull herself, either grabbing the bannister or bending over and climbing up the steps on all fours.  Her legs  ache.  She was evaluated by pain management for low back and left hip pain.  She has an MRI of the lumbar spine without contrast on 05/02/2018, which was personally reviewed, and showed multilevel lumbar spondylosis and degenerative endplate marrow edema at L1-L2 with left lateral disc bulge and spurring and mild left L1 foraminal stenosis as well as edema in the medial left psoas muscle at the L1-L2 level.  She was told surgery wasn't an option.       She presented to the ED on 04/07/2019 following a fall in which she struck the back of her head, sustaining a contusion.  CT of head and cervical spine personally reviewed showed no evidence of trauma or other acute abnormality.  No associated dizziness or passing out.  She also reports numbness and tingling involving her fingers and toes.  She feels shooting pain in the right ring finger and left middle finger.  She reports spurs in her neck but denies any significant neck pain.  No weakness in the upper extremities.  She has restless leg syndrome which has gotten worse over the past several months.  It feels like that her legs are jumping.  She needs to keep moving her legs for relief.  She is starting to notice it earlier in the evening while sitting and watching TV.  She was started on pramipexole 0.125mg  2 to 3 hours before bedtime which initially was helpful but then needed to increase to 0.25mg .  Labs from February 2021 showed B12 346, folate 12.7,  TSH 1.09, CK 54, aldolase 5.1, ferritin 65.  SPEP/IFE from 08/01/2019 showed polyclonal increase in IgA but no M-spike.  NCV-EMG of lower extremities on 07/24/2019 demonstrated a chronic sensorimotor axonal polyneuropathy.   Past medications:  Gabapentin (rash), Lyrica (rash), nortriptyline (elevated blood pressure)  PAST MEDICAL HISTORY: Past Medical History:  Diagnosis Date   CHF (congestive heart failure) (HCC)    pt. denies   Chronic diastolic heart failure (HCC) 04/22/2015   Complication of anesthesia     COPD (chronic obstructive pulmonary disease) (HCC)    Diverticular disease    DJD (degenerative joint disease)    Dysphagia    Dysrhythmia    afib   GERD (gastroesophageal reflux disease)    Hiatal hernia    Hypertension    Hypothyroidism    Lower leg edema    Neuromuscular disorder (HCC)    neuropathy   PAF (paroxysmal atrial fibrillation) (HCC)    s/p ablation x several times now with occasional episodes of PAF.   She has failed sotolol/dofetilide/flecainide/propafenone/Amio and dronedarone either with not controlling her PAF or having side effects   Pneumonia    PONV (postoperative nausea and vomiting)    Restless leg syndrome    Seasonal allergies     MEDICATIONS: Current Outpatient Medications on File Prior to Visit  Medication Sig Dispense Refill   acetaminophen (TYLENOL) 500 MG tablet Take 1,000 mg by mouth every 8 (eight) hours as needed for moderate pain.     butalbital-acetaminophen-caffeine (FIORICET) 50-325-40 MG tablet Take 1 tablet by mouth 2 (two) times daily as needed for headache.     clotrimazole (FUNGICURE INTENSIVE/NAILGUARD) 1 % external solution Apply 1 Application topically 2 (two) times daily.     cyanocobalamin (VITAMIN B12) 1000 MCG tablet Take 1,000 mcg by mouth daily.     dextromethorphan (DELSYM) 30 MG/5ML liquid Take 60 mg by mouth as needed for cough.     DULoxetine (CYMBALTA) 30 MG capsule TAKE 1 CAPSULE BY MOUTH DAILY. 30 capsule 4   ELIQUIS 5 MG TABS tablet TAKE 1 TABLET BY MOUTH TWO TIMES DAILY. 60 tablet 5   esomeprazole (NEXIUM) 20 MG capsule Take 20 mg by mouth daily at 12 noon.     estradiol (CLIMARA - DOSED IN MG/24 HR) 0.025 mg/24hr patch Place 0.025 mg onto the skin once a week.     furosemide (LASIX) 40 MG tablet TAKE 1 AND 1/2 TABLETS BY MOUTH DAILY. 135 tablet 3   LORazepam (ATIVAN) 1 MG tablet Take 0.5-1 mg by mouth at bedtime as needed for sleep. For sleep     MAGNESIUM PO Take 400 mg by mouth daily.     mupirocin ointment  (BACTROBAN) 2 % Apply 1 application topically 2 (two) times daily as needed (wound care).     OVER THE COUNTER MEDICATION Apply 1 application topically at bedtime as needed (pain). [employer] pain relief cream     potassium chloride (KLOR-CON) 10 MEQ tablet TAKE 1 TABLET BY MOUTH DAILY 90 tablet 3   pramipexole (MIRAPEX) 1 MG tablet Take 1 mg by mouth at bedtime.     Riboflavin 400 MG CAPS Take 400 mg by mouth daily.     sennosides-docusate sodium (SENOKOT-S) 8.6-50 MG tablet Take 1 tablet by mouth daily as needed for constipation.     SYNTHROID 125 MCG tablet Take 125 mcg by mouth every morning.     valsartan (DIOVAN) 80 MG tablet Take 40 mg by mouth daily as needed (Take 40 mg if blood pressure  is over 130).     verapamil (CALAN-SR) 120 MG CR tablet Take 120 mg by mouth daily as needed (Tachycardia).     Vitamin D, Cholecalciferol, 50 MCG (2000 UT) CAPS Take 2,000 Units by mouth daily.     No current facility-administered medications on file prior to visit.    ALLERGIES: Allergies  Allergen Reactions   Codeine Nausea And Vomiting and Nausea Only   Penicillins Hives    Has patient had a PCN reaction causing immediate rash, facial/tongue/throat swelling, SOB or lightheadedness with hypotension: No Has patient had a PCN reaction causing severe rash involving mucus membranes or skin necrosis: No Has patient had a PCN reaction that required hospitalization No Has patient had a PCN reaction occurring within the last 10 years: No If all of the above answers are "NO", then may proceed with Cephalosporin use.   Tetracycline Rash   Tramadol Nausea Only   Digoxin Other (See Comments)    Flushed neck and face   Digoxin And Related Other (See Comments)    ' Makes my face blood red"   Erythromycin Other (See Comments) and Hives    Flushing on face   Sulfa Antibiotics Other (See Comments)    PRESERVATIVE OF SOME FOODS- MAKES FLUSHES TO FACE AND NECK   Tetracyclines & Related Other (See Comments)     "Sores on legs"   Diltiazem Hcl Rash    Rash with long acting form   Lyrica [Pregabalin] Rash   Sulfites Rash    PRESERVATIVE OF SOME FOODS- MAKES FLUSHES TO FACE AND NECK PRESERVATIVE OF SOME FOODS- MAKES FLUSHES TO FACE AND NECK    FAMILY HISTORY: Family History  Problem Relation Age of Onset   Transient ischemic attack Mother    Heart failure Father    CVA Father    Lung cancer Father    Hypertension Sister    Cancer Brother    Sudden death Brother    Anesthesia problems Neg Hx    Hypotension Neg Hx    Malignant hyperthermia Neg Hx    Pseudochol deficiency Neg Hx    Heart attack Neg Hx       Objective:  *** General: No acute distress.  Patient appears ***-groomed.   Head:  Normocephalic/atraumatic Eyes:  Fundi examined but not visualized Neck: supple, no paraspinal tenderness, full range of motion Heart:  Regular rate and rhythm Lungs:  Clear to auscultation bilaterally Back: No paraspinal tenderness Neurological Exam: alert and oriented to person, place, and time.  Speech fluent and not dysarthric, language intact.  CN II-XII intact. Bulk and tone normal, muscle strength 5/5 throughout.  Sensation to light touch intact.  Deep tendon reflexes 2+ throughout, toes downgoing.  Finger to nose testing intact.  Gait normal, Romberg negative.   Shon Millet, DO  CC: ***

## 2022-08-15 ENCOUNTER — Ambulatory Visit (INDEPENDENT_AMBULATORY_CARE_PROVIDER_SITE_OTHER): Payer: Medicare Other | Admitting: Neurology

## 2022-08-15 ENCOUNTER — Encounter: Payer: Self-pay | Admitting: Neurology

## 2022-08-15 VITALS — BP 162/83 | HR 98 | Ht 68.0 in | Wt 189.0 lb

## 2022-08-15 DIAGNOSIS — G629 Polyneuropathy, unspecified: Secondary | ICD-10-CM

## 2022-08-15 DIAGNOSIS — G2581 Restless legs syndrome: Secondary | ICD-10-CM | POA: Diagnosis not present

## 2022-08-15 DIAGNOSIS — M5481 Occipital neuralgia: Secondary | ICD-10-CM | POA: Diagnosis not present

## 2022-08-15 DIAGNOSIS — M47812 Spondylosis without myelopathy or radiculopathy, cervical region: Secondary | ICD-10-CM | POA: Diagnosis not present

## 2022-08-15 DIAGNOSIS — I10 Essential (primary) hypertension: Secondary | ICD-10-CM | POA: Diagnosis not present

## 2022-08-15 MED ORDER — DULOXETINE HCL 60 MG PO CPEP: 60.0000 mg | ORAL_CAPSULE | Freq: Every day | ORAL | 5 refills | Status: DC

## 2022-08-15 MED ORDER — DULOXETINE HCL 60 MG PO CSDR: 60.0000 mg | DELAYED_RELEASE_CAPSULE | Freq: Every day | ORAL | 5 refills | Status: DC

## 2022-08-15 NOTE — Patient Instructions (Signed)
Increase duloxetine to 60 mg daily

## 2022-08-25 DIAGNOSIS — I4891 Unspecified atrial fibrillation: Secondary | ICD-10-CM | POA: Diagnosis not present

## 2022-08-25 DIAGNOSIS — I10 Essential (primary) hypertension: Secondary | ICD-10-CM | POA: Diagnosis not present

## 2022-08-25 DIAGNOSIS — Z79899 Other long term (current) drug therapy: Secondary | ICD-10-CM | POA: Diagnosis not present

## 2022-08-25 DIAGNOSIS — R32 Unspecified urinary incontinence: Secondary | ICD-10-CM | POA: Diagnosis not present

## 2022-08-25 DIAGNOSIS — J449 Chronic obstructive pulmonary disease, unspecified: Secondary | ICD-10-CM | POA: Diagnosis not present

## 2022-08-25 DIAGNOSIS — E559 Vitamin D deficiency, unspecified: Secondary | ICD-10-CM | POA: Diagnosis not present

## 2022-08-25 DIAGNOSIS — E039 Hypothyroidism, unspecified: Secondary | ICD-10-CM | POA: Diagnosis not present

## 2022-08-25 DIAGNOSIS — G629 Polyneuropathy, unspecified: Secondary | ICD-10-CM | POA: Diagnosis not present

## 2022-08-25 DIAGNOSIS — T148XXA Other injury of unspecified body region, initial encounter: Secondary | ICD-10-CM | POA: Diagnosis not present

## 2022-08-25 DIAGNOSIS — Z0000 Encounter for general adult medical examination without abnormal findings: Secondary | ICD-10-CM | POA: Diagnosis not present

## 2022-08-25 DIAGNOSIS — I5032 Chronic diastolic (congestive) heart failure: Secondary | ICD-10-CM | POA: Diagnosis not present

## 2022-08-25 DIAGNOSIS — R350 Frequency of micturition: Secondary | ICD-10-CM | POA: Diagnosis not present

## 2022-08-25 DIAGNOSIS — G2581 Restless legs syndrome: Secondary | ICD-10-CM | POA: Diagnosis not present

## 2022-08-25 DIAGNOSIS — Z7901 Long term (current) use of anticoagulants: Secondary | ICD-10-CM | POA: Diagnosis not present

## 2022-09-06 ENCOUNTER — Encounter (HOSPITAL_BASED_OUTPATIENT_CLINIC_OR_DEPARTMENT_OTHER): Payer: Medicare Other | Attending: Internal Medicine | Admitting: Internal Medicine

## 2022-09-06 DIAGNOSIS — Z7901 Long term (current) use of anticoagulants: Secondary | ICD-10-CM | POA: Insufficient documentation

## 2022-09-06 DIAGNOSIS — K449 Diaphragmatic hernia without obstruction or gangrene: Secondary | ICD-10-CM | POA: Insufficient documentation

## 2022-09-06 DIAGNOSIS — G2581 Restless legs syndrome: Secondary | ICD-10-CM | POA: Insufficient documentation

## 2022-09-06 DIAGNOSIS — M1990 Unspecified osteoarthritis, unspecified site: Secondary | ICD-10-CM | POA: Diagnosis not present

## 2022-09-06 DIAGNOSIS — I4819 Other persistent atrial fibrillation: Secondary | ICD-10-CM | POA: Diagnosis not present

## 2022-09-06 DIAGNOSIS — I5032 Chronic diastolic (congestive) heart failure: Secondary | ICD-10-CM | POA: Diagnosis not present

## 2022-09-06 DIAGNOSIS — J449 Chronic obstructive pulmonary disease, unspecified: Secondary | ICD-10-CM | POA: Insufficient documentation

## 2022-09-06 DIAGNOSIS — Z96651 Presence of right artificial knee joint: Secondary | ICD-10-CM | POA: Insufficient documentation

## 2022-09-06 DIAGNOSIS — K219 Gastro-esophageal reflux disease without esophagitis: Secondary | ICD-10-CM | POA: Diagnosis not present

## 2022-09-06 DIAGNOSIS — I110 Hypertensive heart disease with heart failure: Secondary | ICD-10-CM | POA: Insufficient documentation

## 2022-09-06 DIAGNOSIS — I872 Venous insufficiency (chronic) (peripheral): Secondary | ICD-10-CM | POA: Insufficient documentation

## 2022-09-06 DIAGNOSIS — X12XXXA Contact with other hot fluids, initial encounter: Secondary | ICD-10-CM | POA: Insufficient documentation

## 2022-09-06 DIAGNOSIS — E039 Hypothyroidism, unspecified: Secondary | ICD-10-CM | POA: Diagnosis not present

## 2022-09-06 DIAGNOSIS — Z9049 Acquired absence of other specified parts of digestive tract: Secondary | ICD-10-CM | POA: Insufficient documentation

## 2022-09-06 DIAGNOSIS — Z9089 Acquired absence of other organs: Secondary | ICD-10-CM | POA: Diagnosis not present

## 2022-09-06 DIAGNOSIS — T25221A Burn of second degree of right foot, initial encounter: Secondary | ICD-10-CM | POA: Diagnosis not present

## 2022-09-06 NOTE — Progress Notes (Signed)
Natasha [name], Natasha [name] ([id number]) [phone] [id number] Page 1 of 4 Visit Report for 09/06/2022 Abuse Risk Screen Details Patient Name: Date of Service: Natasha [name], Natasha [name] 09/06/2022 2:45 PM Medical Record Number: [id number] Patient Account Number: [id number] Date of Birth/Sex: Treating RN: 01-06-1933 (89 y.o. Orville Govern Primary Care Fayola Meckes: Hillard Danker Other Clinician: Referring Ashyah Quizon: Treating Jamaria Amborn/Extender: Kizzie Furnish Weeks in Treatment: 0 Abuse Risk Screen Items Answer ABUSE RISK SCREEN: Has anyone close to you tried to hurt or harm you recentlyo No Do you feel uncomfortable with anyone in your familyo No Has anyone forced you do things that you didnt want to doo No Electronic Signature(s) Signed: 09/06/2022 4:26:34 PM By: Redmond Pulling RN, BSN Entered By: Redmond Pulling on 09/06/2022 15:10:42 -------------------------------------------------------------------------------- Activities of Daily Living Details Patient Name: Date of Service: Natasha [name], Natasha [name] 09/06/2022 2:45 PM Medical Record Number: [id number] Patient Account Number: [id number] Date of Birth/Sex: Treating RN: Mar 09, 1933 (89 y.o. Orville Govern Primary Care Danniela Mcbrearty: Hillard Danker Other Clinician: Referring Rasheida Broden: Treating Noemi Bellissimo/Extender: Kizzie Furnish Weeks in Treatment: 0 Activities of Daily Living Items Answer Activities of Daily Living (Please select one for each item) Drive Automobile Completely Able T Medications ake Completely Able Use T elephone Completely Able Care for Appearance Completely Able Use T oilet Completely Able Bath / Shower Completely Able Dress Self Completely Able Feed Self Completely Able Walk Completely Able Get In / Out Bed Completely Able Housework Completely Able Prepare Meals Completely Able Handle Money Completely Able Shop for Self Completely Able Electronic Signature(s) Signed: 09/06/2022 4:26:34 PM By:  Redmond Pulling RN, BSN Entered By: Redmond Pulling on 09/06/2022 15:11:09 -------------------------------------------------------------------------------- Education Screening Details Patient Name: Date of Service: Natasha Mems NE [name]. 09/06/2022 2:45 PM Medical Record Number: [id number] Patient Account Number: [id number] Date of Birth/Sex: Treating RN: 12-21-1932 (89 y.o. Orville Govern Primary Care Mahagony Grieb: Hillard Danker Other Clinician: Referring Zamarion Longest: Treating Dashanna Kinnamon/Extender: Kizzie Furnish Weeks in Treatment: 0 Natasha [name], Natasha [name] ([id number]) [id number] Page 2 of 4 Primary Learner Assessed: Patient Learning Preferences/Education Level/Primary Language Learning Preference: Explanation, Demonstration, Printed Material Preferred Language: English Cognitive Barrier Language Barrier: No Translator Needed: No Memory Deficit: No Emotional Barrier: No Cultural/Religious Beliefs Affecting Medical Care: No Physical Barrier Impaired Vision: No Impaired Hearing: No Decreased Hand dexterity: No Knowledge/Comprehension Knowledge Level: High Comprehension Level: High Ability to understand written instructions: High Ability to understand verbal instructions: High Motivation Anxiety Level: Calm Cooperation: Cooperative Education Importance: Acknowledges Need Interest in Health Problems: Asks Questions Perception: Coherent Willingness to Engage in Self-Management High Activities: Readiness to Engage in Self-Management High Activities: Electronic Signature(s) Signed: 09/06/2022 4:26:34 PM By: Redmond Pulling RN, BSN Entered By: Redmond Pulling on 09/06/2022 15:11:39 -------------------------------------------------------------------------------- Fall Risk Assessment Details Patient Name: Date of Service: Natasha Mems NE [name]. 09/06/2022 2:45 PM Medical Record Number: [id number] Patient Account Number: [id number] Date of Birth/Sex: Treating  RN: 05-21-1932 (89 y.o. Orville Govern Primary Care Berdell Nevitt: Hillard Danker Other Clinician: Referring Dainelle Hun: Treating Savreen Gebhardt/Extender: Kizzie Furnish Weeks in Treatment: 0 Fall Risk Assessment Items Have you had 2 or more falls in the last 12 monthso 0 No Have you had any fall that resulted in injury in the last 12 monthso 0 No FALLS RISK SCREEN History of falling - immediate or within 3 months 25 Yes Secondary diagnosis (Do you have 2 or more medical diagnoseso) 0 No Ambulatory aid None/bed rest/wheelchair/nurse 0 Yes Crutches/cane/walker 0 No Furniture 0 No Intravenous therapy Access/Saline/Heparin Lock 0 No  Gait/Transferring Normal/ bed rest/ wheelchair 0 Yes Weak (short steps with or without shuffle, stooped but able to lift head while walking, may seek 0 No support from furniture) Impaired (short steps with shuffle, may have difficulty arising from chair, head down, impaired 0 No balance) Mental Status Oriented to own ability 0 Yes Overestimates or forgets limitations 0 No Risk Level: Medium Risk Score: 25 Natasha [name], Natasha [name] ([id number]) [phone] [id number] Page 3 of 4 Electronic Signature(s) -------------------------------------------------------------------------------- Foot Assessment Details Patient Name: Date of Service: Natasha [name], Natasha [name] 09/06/2022 2:45 PM Medical Record Number: [id number] Patient Account Number: [id number] Date of Birth/Sex: Treating RN: [birth year] (89 y.o. Orville Govern Primary Care Asjah Rauda: Hillard Danker Other Clinician: Referring Jamylah Marinaccio: Treating Kahron Kauth/Extender: Kizzie Furnish Weeks in Treatment: 0 Foot Assessment Items Site Locations + = Sensation present, - = Sensation absent, C = Callus, U = Ulcer R = Redness, W = Warmth, M = Maceration, PU = Pre-ulcerative lesion F = Fissure, S = Swelling, D = Dryness Assessment Right: Left: Other Deformity: No No Prior Foot Ulcer: No  No Prior Amputation: No No Charcot Joint: No No Ambulatory Status: Ambulatory Without Help Gait: Steady Electronic Signature(s) Signed: 09/06/2022 4:26:34 PM By: Redmond Pulling RN, BSN Entered By: Redmond Pulling on 09/06/2022 15:14:04 -------------------------------------------------------------------------------- Nutrition Risk Screening Details Patient Name: Date of Service: Natasha [name], Natasha [name] 09/06/2022 2:45 PM Medical Record Number: [id number] Patient Account Number: [id number] Date of Birth/Sex: Treating RN: [birth year] (89 y.o. Orville Govern Primary Care Wojciech Willetts: Hillard Danker Other Clinician: Referring Holbert Caples: Treating Renardo Cheatum/Extender: Kizzie Furnish Weeks in Treatment: 0 Height (in): 69 Weight (lbs): 190 Body Mass Index (BMI): 28.1 Natasha [name], Natasha [name] ([id number]) [phone] [id number] Page 4 of 4 Nutrition Risk Screening Items Score Screening NUTRITION RISK SCREEN: I have an illness or condition that made me change the kind and/or amount of food I eat 0 No I eat fewer than two meals per day 0 No I eat few fruits and vegetables, or milk products 0 No I have three or more drinks of beer, liquor or wine almost every day 0 No I have tooth or mouth problems that make it hard for me to eat 0 No I don't always have enough money to buy the food I need 0 No I eat alone most of the time 0 No I take three or more different prescribed or over-the-counter drugs a day 1 Yes Without wanting to, I have lost or gained 10 pounds in the last six months 0 No I am not always physically able to shop, cook and/or feed myself 0 No Nutrition Protocols Good Risk Protocol Moderate Risk Protocol High Risk Proctocol Risk Level: Good Risk Score: 1 Electronic Signature(s) Signed: 09/06/2022 4:26:34 PM By: Redmond Pulling RN, BSN Entered By: Redmond Pulling on 09/06/2022 15:12:37

## 2022-09-30 DIAGNOSIS — B351 Tinea unguium: Secondary | ICD-10-CM | POA: Diagnosis not present

## 2022-10-05 DIAGNOSIS — H538 Other visual disturbances: Secondary | ICD-10-CM | POA: Diagnosis not present

## 2022-10-05 DIAGNOSIS — G43909 Migraine, unspecified, not intractable, without status migrainosus: Secondary | ICD-10-CM | POA: Diagnosis not present

## 2022-10-12 DIAGNOSIS — E871 Hypo-osmolality and hyponatremia: Secondary | ICD-10-CM | POA: Diagnosis not present

## 2022-10-17 NOTE — Progress Notes (Signed)
SRIJA, CHRISLEY ([id number]) [id number] Page 1 of 7 Visit Report for 09/06/2022 Chief Complaint Document Details Patient Name: Date of Service: Natasha [name], Natasha [name] 09/06/2022 2:45 PM Medical Record Number: [id number] Patient Account Number: [id number] Date of Birth/Sex: Treating RN: April 18, 1933 (89 y.o. F) Primary Care Provider: Hillard Danker Other Clinician: Referring Provider: Treating Provider/Extender: Kizzie Furnish Weeks in Treatment: 0 Information Obtained from: Patient Chief Complaint 09/06/2022; burn to the right great toe Electronic Signature(s) Signed: 09/06/2022 4:32:32 PM By: Geralyn Corwin DO Entered By: Geralyn Corwin on 09/06/2022 15:23:35 -------------------------------------------------------------------------------- HPI Details Patient Name: Date of Service: Natasha Palma [name]. 09/06/2022 2:45 PM Medical Record Number: [id number] Patient Account Number: [id number] Date of Birth/Sex: Treating RN: 09-09-1932 (89 y.o. F) Primary Care Provider: Hillard Danker Other Clinician: Referring Provider: Treating Provider/Extender: Kizzie Furnish Weeks in Treatment: 0 History of Present Illness HPI Description: 09/06/2022 Ms. Natasha [name] is an 89 year old female with a past medical history of venous insufficiency and chronic diastolic heart failure that presents to the clinic for evaluation of 2-3-week history of burn to her right foot. She states that she was boiling water and tipped it over by accident creating a burn to her right great toe. She followed up with her PCP for this issue and has been using mupirocin ointment on the wound. Fortunately, the wound has healed and is completely epithelialized. She has no issues or complaints today. Electronic Signature(s) Signed: 09/06/2022 4:32:32 PM By: Geralyn Corwin DO Entered By: Geralyn Corwin on 09/06/2022  15:25:14 -------------------------------------------------------------------------------- Physical Exam Details Patient Name: Date of Service: Natasha [name], Natasha [name] 09/06/2022 2:45 PM Medical Record Number: [id number] Patient Account Number: [id number] Date of Birth/Sex: Treating RN: 27-Aug-1932 (89 y.o. F) Primary Care Provider: Hillard Danker Other Clinician: Referring Provider: Treating Provider/Extender: Lyberty, Moss ([id number]) [id number] Page 2 of 7 Weeks in Treatment: 0 Constitutional respirations regular, non-labored and within target range for patient.. Cardiovascular 2+ dorsalis pedis/posterior tibialis pulses. Psychiatric pleasant and cooperative. Notes T the right great toe there is epithelization to the previous burn site. No signs of infection. o Electronic Signature(s) Signed: 09/06/2022 4:32:32 PM By: Geralyn Corwin DO Entered By: Geralyn Corwin on 09/06/2022 15:25:35 -------------------------------------------------------------------------------- Physician Orders Details Patient Name: Date of Service: Natasha [name], Natasha [name] 09/06/2022 2:45 PM Medical Record Number: [id number] Patient Account Number: [id number] Date of Birth/Sex: Treating RN: 1933-03-24 (89 y.o. Natasha [name] Primary Care Provider: Hillard Danker Other Clinician: Referring Provider: Treating Provider/Extender: Kizzie Furnish Weeks in Treatment: 0 Verbal / Phone Orders: No Diagnosis Coding ICD-10 Coding Code Description T25.221A Burn of second degree of right foot, initial encounter I87.2 Venous insufficiency (chronic) (peripheral) I50.32 Chronic diastolic (congestive) heart failure I48.19 Other persistent atrial fibrillation Z79.01 Long term (current) use of anticoagulants Discharge From [facility] Discharge from [facility] - YAY!!!!! no wounds If you need [place] in the future, please call Keep area covered to protect fragile  new skin. Moisturize with lotion. Electronic Signature(s) Signed: 09/06/2022 4:32:32 PM By: Geralyn Corwin DO Entered By: Geralyn Corwin on 09/06/2022 15:25:40 -------------------------------------------------------------------------------- Problem List Details Patient Name: Date of Service: Natasha [name], Natasha NE [name]. 09/06/2022 2:45 PM Medical Record Number: [id number] Patient Account Number: [id number] Date of Birth/Sex: Treating RN: May 31, 1932 (89 y.o. F) Primary Care Provider: Hillard Danker Other Clinician: Referring Provider: Treating Provider/Extender: Kizzie Furnish Weeks in Treatment: [address] RUBINA, Natasha [name] ([id number]) [id number] Page 3 of 7 Active Problems ICD-10 Encounter Code Description Active Date MDM Diagnosis T25.221A  Burn of second degree of right foot, initial encounter 09/06/2022 No Yes I87.2 Venous insufficiency (chronic) (peripheral) 09/06/2022 No Yes I50.32 Chronic diastolic (congestive) heart failure 09/06/2022 No Yes I48.19 Other persistent atrial fibrillation 09/06/2022 No Yes Z79.01 Long term (current) use of anticoagulants 09/06/2022 No Yes Inactive Problems Resolved Problems Electronic Signature(s) Signed: 09/06/2022 4:32:32 PM By: Geralyn Corwin DO Entered By: Geralyn Corwin on 09/06/2022 15:23:12 -------------------------------------------------------------------------------- Progress Note Details Patient Name: Date of Service: Natasha Shay. 09/06/2022 2:45 PM Medical Record Number: [id number] Patient Account Number: [id number] Date of Birth/Sex: Treating RN: 12/06/1932 (89 y.o. F) Primary Care Provider: Hillard Danker Other Clinician: Referring Provider: Treating Provider/Extender: Kizzie Furnish Weeks in Treatment: 0 Subjective Chief Complaint Information obtained from Patient 09/06/2022; burn to the right great toe History of Present Illness (HPI) 09/06/2022 Ms. Natasha [name] is an 89 year old female with a  past medical history of venous insufficiency and chronic diastolic heart failure that presents to the clinic for evaluation of 2-3-week history of burn to her right foot. She states that she was boiling water and tipped it over by accident creating a burn to her right great toe. She followed up with her PCP for this issue and has been using mupirocin ointment on the wound. Fortunately, the wound has healed and is completely epithelialized. She has no issues or complaints today. Patient History Information obtained from Patient. Allergies codeine (Severity: Mild, Reaction: N/V), penicillin (Severity: Severe, Reaction: hives), tetracycline (Severity: Severe, Reaction: rash), tramadol, digoxin, erythromycin base, Sulfa (Sulfonamide Antibiotics), diltiazem, Lyrica Family History Cancer - Father, Heart Disease - Father, Hypertension - Siblings, Lung Disease - Father, Stroke - Mother,Father. Social History ARZELLA, SALGUEIRO ([id number]) [id number] Page 4 of 7 Never smoker, Marital Status - Married, Alcohol Use - Never, Drug Use - No History, Caffeine Use - Daily. Medical History Eyes Patient has history of Cataracts - removed Respiratory Patient has history of Chronic Obstructive Pulmonary Disease (COPD) Cardiovascular Patient has history of Arrhythmia - A.fib, Congestive Heart Failure, Hypertension Integumentary (Skin) Denies history of History of Burn Musculoskeletal Patient has history of Osteoarthritis Neurologic Patient has history of Neuropathy Hospitalization/Surgery History - right knee replacement 2022. - cardioversion. - A. fib ablation. - tonsils removed. - hysterectomy. - thyroidectomy 1973. - appendectomy. Medical A Surgical History Notes nd Constitutional Symptoms (General Health) hypothyroidism Cardiovascular A-fib in the past, no longer has it Gastrointestinal GERD hiatal hernia Musculoskeletal Restless leg syndrome DJD neuromuscular  disorder Review of Systems (ROS) Eyes Complains or has symptoms of Glasses / Contacts. Ear/Nose/Mouth/Throat Denies complaints or symptoms of Chronic sinus problems or rhinitis. Gastrointestinal Denies complaints or symptoms of Frequent diarrhea, Nausea, Vomiting. Endocrine Denies complaints or symptoms of Heat/cold intolerance, Thyroid removed Genitourinary Complains or has symptoms of Frequent urination. Integumentary (Skin) Denies complaints or symptoms of Wounds. Neurologic Denies complaints or symptoms of Numbness/parasthesias. Psychiatric Denies complaints or symptoms of Claustrophobia. Objective Constitutional respirations regular, non-labored and within target range for patient.. Vitals Time Taken: 3:02 PM, Height: 69 in, Source: Stated, Weight: 190 lbs, Source: Stated, BMI: 28.1, Temperature: 97.9 F, Pulse: 78 bpm, Respiratory Rate: 18 breaths/min, Blood Pressure: 172/91 mmHg. Cardiovascular 2+ dorsalis pedis/posterior tibialis pulses. Psychiatric pleasant and cooperative. General Notes: T the right great toe there is epithelization to the previous burn site. No signs of infection. o Assessment Active Problems ICD-10 Burn of second degree of right foot, initial encounter Venous insufficiency (chronic) (peripheral) Chronic diastolic (congestive) heart failure Other persistent atrial fibrillation Long term (current) use of anticoagulants Natasha [name], Natasha [name] (  [id number]) [id number] Page 5 of 7 Patient has a recent history of burn to the right great toe secondary to boiling water. Fortunately with her home care and use of mupirocin her wound has healed. Nothing further to do. She may follow-up as needed. Plan Discharge From [facility] Services: Discharge from Wound Care Center - YAY!!!!! no wounds If you need [place] in the future, please call Keep area covered to protect fragile new skin. Moisturize with lotion. 1. Follow-up as needed Electronic  Signature(s) Signed: 10/17/2022 1:53:12 PM By: Pearletha Alfred Signed: 10/17/2022 2:57:08 PM By: Geralyn Corwin DO Previous Signature: 09/06/2022 4:32:32 PM Version By: Geralyn Corwin DO Entered By: Pearletha Alfred on 10/17/2022 13:53:12 -------------------------------------------------------------------------------- HxROS Details Patient Name: Date of Service: Natasha [name], Natasha NE [name]. 09/06/2022 2:45 PM Medical Record Number: [id number] Patient Account Number: [id number] Date of Birth/Sex: Treating RN: 1932/07/11 (89 y.o. Arta Silence Primary Care Provider: Hillard Danker Other Clinician: Referring Provider: Treating Provider/Extender: Kizzie Furnish Weeks in Treatment: 0 Information Obtained From Patient Eyes Complaints and Symptoms: Positive for: Glasses / Contacts Medical History: Positive for: Cataracts - removed Ear/Nose/Mouth/Throat Complaints and Symptoms: Negative for: Chronic sinus problems or rhinitis Gastrointestinal Complaints and Symptoms: Negative for: Frequent diarrhea; Nausea; Vomiting Medical History: Past Medical History Notes: GERD hiatal hernia Endocrine Complaints and Symptoms: Negative for: Heat/cold intolerance Review of System Notes: Thyroid removed Genitourinary Complaints and Symptoms: Positive for: Frequent urination Integumentary (Skin) Natasha [name], Natasha [name] ([id number]) [id number] Page 6 of 7 Complaints and Symptoms: Negative for: Wounds Medical History: Negative for: History of Burn Neurologic Complaints and Symptoms: Negative for: Numbness/parasthesias Medical History: Positive for: Neuropathy Psychiatric Complaints and Symptoms: Negative for: Claustrophobia Constitutional Symptoms (General Health) Medical History: Past Medical History Notes: hypothyroidism Hematologic/Lymphatic Respiratory Medical History: Positive for: Chronic Obstructive Pulmonary Disease (COPD) Cardiovascular Medical  History: Positive for: Arrhythmia - A.fib; Congestive Heart Failure; Hypertension Past Medical History Notes: A-fib in the past, no longer has it Immunological Musculoskeletal Medical History: Positive for: Osteoarthritis Past Medical History Notes: Restless leg syndrome DJD neuromuscular disorder Oncologic HBO Extended History Items Eyes: Cataracts Immunizations Pneumococcal Vaccine: Received Pneumococcal Vaccination: Yes Received Pneumococcal Vaccination On or After 60th Birthday: Yes Implantable Devices None Hospitalization / Surgery History Type of Hospitalization/Surgery right knee replacement 2022 cardioversion A. fib ablation tonsils removed hysterectomy thyroidectomy 1973 appendectomy Family and Social History Cancer: Yes - Father; Heart Disease: Yes - Father; Hypertension: Yes - Siblings; Lung Disease: Yes - Father; Stroke: Yes - Mother,Father; Never smoker; Natasha [name], Natasha [name] ([id number]) [id number] Page 7 of 7 Marital Status - Married; Alcohol Use: Never; Drug Use: No History; Caffeine Use: Daily; Financial Concerns: No; Food, Clothing or Shelter Needs: No; Support System Lacking: No; Transportation Concerns: No Psychologist, prison and probation services) Signed: 09/06/2022 4:26:34 PM By: Redmond Pulling RN, BSN Signed: 09/06/2022 4:32:32 PM By: Geralyn Corwin DO Signed: 09/06/2022 5:53:38 PM By: Shawn Stall RN, BSN Entered By: Redmond Pulling on 09/06/2022 15:10:33 -------------------------------------------------------------------------------- SuperBill Details Patient Name: Date of Service: NIKA, NOVICKI 09/06/2022 Medical Record Number: [id number] Patient Account Number: [id number] Date of Birth/Sex: Treating RN: Apr 06, 1933 (89 y.o. F) Primary Care Provider: Hillard Danker Other Clinician: Referring Provider: Treating Provider/Extender: Kizzie Furnish Weeks in Treatment: 0 Diagnosis Coding ICD-10 Codes Code Description T25.221A Burn  of second degree of right foot, initial encounter I87.2 Venous insufficiency (chronic) (peripheral) I50.32 Chronic diastolic (congestive) heart failure I48.19 Other persistent atrial fibrillation Z79.01 Long term (current) use of anticoagulants Facility Procedures : CPT4 Code: [id number] Description: 99213 - WOUND CARE VISIT-LEV 3  EST PT Modifier: Quantity: 1 Physician Procedures : CPT4 Code Description Modifier [id number] WC PHYS LEVEL 3 NEW PT ICD-10 Diagnosis Description T25.221A Burn of second degree of right foot, initial encounter I87.2 Venous insufficiency (chronic) (peripheral) I50.32 Chronic diastolic (congestive) heart  failure I48.19 Other persistent atrial fibrillation Quantity: 1 Electronic Signature(s) Signed: 09/06/2022 4:26:34 PM By: Redmond Pulling RN, BSN Signed: 09/06/2022 4:32:32 PM By: Geralyn Corwin DO Entered By: Redmond Pulling on 09/06/2022 15:40:09

## 2022-11-02 ENCOUNTER — Other Ambulatory Visit: Payer: Self-pay | Admitting: Internal Medicine

## 2022-11-02 DIAGNOSIS — Z1231 Encounter for screening mammogram for malignant neoplasm of breast: Secondary | ICD-10-CM

## 2022-11-03 DIAGNOSIS — E039 Hypothyroidism, unspecified: Secondary | ICD-10-CM | POA: Diagnosis not present

## 2022-11-03 DIAGNOSIS — I10 Essential (primary) hypertension: Secondary | ICD-10-CM | POA: Diagnosis not present

## 2022-11-03 DIAGNOSIS — I5032 Chronic diastolic (congestive) heart failure: Secondary | ICD-10-CM | POA: Diagnosis not present

## 2022-11-03 DIAGNOSIS — I4891 Unspecified atrial fibrillation: Secondary | ICD-10-CM | POA: Diagnosis not present

## 2022-11-03 DIAGNOSIS — K219 Gastro-esophageal reflux disease without esophagitis: Secondary | ICD-10-CM | POA: Diagnosis not present

## 2022-11-03 DIAGNOSIS — G43009 Migraine without aura, not intractable, without status migrainosus: Secondary | ICD-10-CM | POA: Diagnosis not present

## 2022-11-03 DIAGNOSIS — M170 Bilateral primary osteoarthritis of knee: Secondary | ICD-10-CM | POA: Diagnosis not present

## 2022-11-25 ENCOUNTER — Ambulatory Visit: Payer: Medicare Other

## 2022-12-09 ENCOUNTER — Other Ambulatory Visit: Payer: Self-pay | Admitting: Cardiology

## 2022-12-09 DIAGNOSIS — I4819 Other persistent atrial fibrillation: Secondary | ICD-10-CM

## 2022-12-09 NOTE — Telephone Encounter (Signed)
Prescription refill request for Eliquis received. Indication: Afib  Last office visit: 05/13/22 Mayford Knife)  Scr: 0.7 (10/12/22 via [facility])  Age: 89 Weight: 85.7kg  Appropriate dose. Refill sent.

## 2022-12-14 ENCOUNTER — Ambulatory Visit: Admission: RE | Admit: 2022-12-14 | Payer: Medicare Other | Source: Ambulatory Visit

## 2022-12-14 DIAGNOSIS — Z1231 Encounter for screening mammogram for malignant neoplasm of breast: Secondary | ICD-10-CM | POA: Diagnosis not present

## 2023-02-05 ENCOUNTER — Other Ambulatory Visit: Payer: Self-pay

## 2023-02-05 ENCOUNTER — Ambulatory Visit
Admission: EM | Admit: 2023-02-05 | Discharge: 2023-02-05 | Disposition: A | Discharge status: Home / Self Care | Payer: Medicare Other | Attending: Internal Medicine | Admitting: Internal Medicine

## 2023-02-05 ENCOUNTER — Encounter: Payer: Self-pay | Admitting: *Deleted

## 2023-02-05 ENCOUNTER — Ambulatory Visit: Payer: Medicare Other

## 2023-02-05 DIAGNOSIS — R918 Other nonspecific abnormal finding of lung field: Secondary | ICD-10-CM | POA: Diagnosis not present

## 2023-02-05 DIAGNOSIS — R053 Chronic cough: Secondary | ICD-10-CM | POA: Diagnosis not present

## 2023-02-05 DIAGNOSIS — R21 Rash and other nonspecific skin eruption: Secondary | ICD-10-CM

## 2023-02-05 DIAGNOSIS — R059 Cough, unspecified: Secondary | ICD-10-CM

## 2023-02-05 MED ORDER — BENZONATATE 100 MG PO CAPS: 100.0000 mg | ORAL_CAPSULE | Freq: Three times a day (TID) | ORAL | 0 refills | Status: DC | PRN

## 2023-02-05 MED ORDER — TRIAMCINOLONE ACETONIDE 0.1 % EX CREA: 1.0000 | TOPICAL_CREAM | Freq: Two times a day (BID) | CUTANEOUS | 0 refills | Status: DC

## 2023-02-05 NOTE — ED Triage Notes (Addendum)
Pt reports chronic cough after aspirating hamburger approx 1 year. Cough worse after the last few weeks. States she has been working at [employer] this week and had a bad coughing spell. Wants to be checked out before she goes back to work tomorrow. She is out of cough meds. Also has patchy rash on leg and arm. She had covid this summer and had a negative covid test today at home. She also reports she weaned herself off her duloxetine over the last few weeks

## 2023-02-05 NOTE — ED Provider Notes (Signed)
[name]    CSN: [id number] Arrival date & time: 02/05/23  1510      History   Chief Complaint Chief Complaint  Patient presents with   Cough    HPI Natasha [name] is a 89 y.o. female.   Patient presents with multiple different chief complaints today.  Reports that she has had a chronic cough for most a year after aspirating hamburger.  Reports that she was evaluated by her primary care doctor at that time and developed pneumonia related to this.  She was treated with cough medication and antibiotics with mild improvement.  Although, cough has been persistent over the past year.  She reports that she became worried given that the cough is seem to worsen over the past few weeks.  Cough is dry.  She denies any fever or upper respiratory symptoms.  Denies any known sick contacts.  Not taken any medication for symptoms.  She has not noticed any leg swelling since coughing is worsened.  Patient also reporting an itchy rash to left arm and right leg.  Reports it has been present for a few days.  Denies any changes in environment that could have caused rash.  Denies any known sick contacts or associated fever.  She has applied hydrocortisone cream with minimal and temporary improvement.  Also reports that her scalp is itchy but she has not noticed a rash to that area.  Patient's blood pressure is elevated but she reports is typical when she comes to the doctor's office.  She has been taking her medication as prescribed.  She also mentions to nursing staff during triage that she has recently weaned herself off of Cymbalta with no significant side effects.  She has not yet notified her PCP of this.   Cough   Past Medical History:  Diagnosis Date   CHF (congestive heart failure) (HCC)    pt. denies   Chronic diastolic heart failure (HCC) 04/22/2015   Complication of anesthesia    COPD (chronic obstructive pulmonary disease) (HCC)    Diverticular disease    DJD (degenerative  joint disease)    Dysphagia    Dysrhythmia    afib   GERD (gastroesophageal reflux disease)    Hiatal hernia    Hypertension    Hypothyroidism    Lower leg edema    Neuromuscular disorder (HCC)    neuropathy   PAF (paroxysmal atrial fibrillation) (HCC)    s/p ablation x several times now with occasional episodes of PAF.   She has failed sotolol/dofetilide/flecainide/propafenone/Amio and dronedarone either with not controlling her PAF or having side effects   Pneumonia    PONV (postoperative nausea and vomiting)    Restless leg syndrome    Seasonal allergies     Patient Active Problem List   Diagnosis Date Noted   Osteoarthritis of right knee 10/28/2020   Chronic fatigue 01/10/2017   Chronic diastolic heart failure (HCC) 04/22/2015   Nodule of right lung 09/07/2014   SOB (shortness of breath) 12/12/2013   Femoral bruit 08/27/2013   Claudication (HCC) 08/02/2013   GERD (gastroesophageal reflux disease)    Hypertension    Lower leg edema    Hypothyroidism    Restless leg syndrome    DJD (degenerative joint disease)    Diverticular disease    PONV (postoperative nausea and vomiting)    Hiatal hernia    History of anticoagulant therapy 05/18/2011   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   History of migraine headaches 05/18/2011  Persistent atrial fibrillation (HCC) 05/18/2011   Dysphagia 03/03/2011    Past Surgical History:  Procedure Laterality Date   ABDOMINAL HYSTERECTOMY     APPENDECTOMY     ATRIAL FIBRILLATION ABLATION  09/06/2013   2'14 -[facility]   BALLOON DILATION  03/03/2011   Procedure: BALLOON DILATION;  Surgeon: Charolett Bumpers, MD;  Location: [facility];  Service: Endoscopy;  Laterality: N/A;   bladder polyps     BREAST EXCISIONAL BIOPSY Left    BREAST SURGERY     left lumpectomy   CARDIOVERSION     x3   CARDIOVERSION N/A 12/30/2014   Procedure: CARDIOVERSION;  Surgeon: Lewayne Bunting, MD;  Location: [facility] ENDOSCOPY;  Service:  Cardiovascular;  Laterality: N/A;   CARDIOVERSION N/A 04/23/2015   Procedure: CARDIOVERSION;  Surgeon: Thurmon Fair, MD;  Location: [facility] ENDOSCOPY;  Service: Cardiovascular;  Laterality: N/A;   CATARACT EXTRACTION Left    COLONOSCOPY WITH PROPOFOL N/A 09/24/2013   Procedure: COLONOSCOPY WITH PROPOFOL;  Surgeon: Charolett Bumpers, MD;  Location: [facility];  Service: Endoscopy;  Laterality: N/A;   ESOPHAGOGASTRODUODENOSCOPY  03/03/2011   Procedure: ESOPHAGOGASTRODUODENOSCOPY (EGD);  Surgeon: Charolett Bumpers, MD;  Location: [facility];  Service: Endoscopy;  Laterality: N/A;   ESOPHAGOGASTRODUODENOSCOPY (EGD) WITH PROPOFOL Bilateral 03/04/2022   Procedure: ESOPHAGOGASTRODUODENOSCOPY (EGD) WITH PROPOFOL;  Surgeon: Vida Rigger, MD;  Location: [facility] ENDOSCOPY;  Service: Gastroenterology;  Laterality: Bilateral;   EYE SURGERY     cataracat   RHINOPLASTY     RIGHT HEART CATHETERIZATION N/A 07/11/2014   Procedure: RIGHT HEART CATH;  Surgeon: Laurey Morale, MD;  Location: [facility] CATH LAB;  Service: Cardiovascular;  Laterality: N/A;   ROTATOR CUFF REPAIR     THYROIDECTOMY  1973   TONSILLECTOMY  1942   TOTAL KNEE ARTHROPLASTY Right 10/28/2020   Procedure: TOTAL KNEE ARTHROPLASTY;  Surgeon: Eugenia Mcalpine, MD;  Location: [facility] ORS;  Service: Orthopedics;  Laterality: Right;  with adductor canal   TUBAL LIGATION      OB History   No obstetric history on file.      Home Medications    Prior to Admission medications   Medication Sig Start Date End Date Taking? Authorizing Provider  ELIQUIS 5 MG TABS tablet TAKE 1 TABLET BY MOUTH TWO TIMES DAILY. 12/09/22  Yes Turner, Cornelious Bryant, MD  estradiol (CLIMARA - DOSED IN MG/24 HR) 0.025 mg/24hr patch Place 0.025 mg onto the skin once a week. 06/03/19  Yes [provider]  furosemide (LASIX) 40 MG tablet TAKE 1 AND 1/2 TABLETS BY MOUTH DAILY. 05/06/22  Yes Marinus Maw, MD  potassium chloride (KLOR-CON) 10 MEQ tablet TAKE 1 TABLET BY MOUTH DAILY 12/14/21   Yes Marinus Maw, MD  pramipexole (MIRAPEX) 1 MG tablet Take 1 mg by mouth at bedtime.   Yes [provider]  SYNTHROID 125 MCG tablet Take 125 mcg by mouth every morning.   Yes [provider]  valsartan (DIOVAN) 80 MG tablet Take 40 mg by mouth daily as needed (Take 40 mg if blood pressure is over 130). 10/05/20  Yes [provider]  acetaminophen (TYLENOL) 500 MG tablet Take 1,000 mg by mouth every 8 (eight) hours as needed for moderate pain.    [provider]  butalbital-acetaminophen-caffeine (FIORICET) 50-325-40 MG tablet Take 1 tablet by mouth 2 (two) times daily as needed for headache.    [provider]  cyanocobalamin (VITAMIN B12) 1000 MCG tablet Take 1,000 mcg by mouth daily.    [provider]  DULoxetine (CYMBALTA) 60 MG capsule Take 1 capsule (60 mg total) by mouth daily. 08/15/22   Drema Dallas, DO  esomeprazole (NEXIUM) 20 MG capsule Take 20 mg by mouth daily at 12 noon.    [provider]  LORazepam (ATIVAN) 1 MG tablet Take 0.5-1 mg by mouth at bedtime as needed for sleep. For sleep    [provider]  MAGNESIUM PO Take 400 mg by mouth daily.    [provider]  mupirocin ointment (BACTROBAN) 2 % Apply 1 application topically 2 (two) times daily as needed (wound care).    [provider]  OVER THE COUNTER MEDICATION Apply 1 application topically at bedtime as needed (pain). Theraworks pain relief cream    [provider]  Riboflavin 400 MG CAPS Take 400 mg by mouth daily.    [provider]  sennosides-docusate sodium (SENOKOT-S) 8.6-50 MG tablet Take 1 tablet by mouth daily as needed for constipation.    [provider]  verapamil (CALAN-SR) 120 MG CR tablet Take 120 mg by mouth daily as needed (Tachycardia).    [provider]  Vitamin D, Cholecalciferol, 50 MCG (2000 UT) CAPS Take 2,000 Units by mouth daily.    [provider]    Family  History Family History  Problem Relation Age of Onset   Transient ischemic attack Mother    Heart failure Father    CVA Father    Lung cancer Father    Hypertension Sister    Cancer Brother    Sudden death Brother    Anesthesia problems Neg Hx    Hypotension Neg Hx    Malignant hyperthermia Neg Hx    Pseudochol deficiency Neg Hx    Heart attack Neg Hx     Social History Social History   Tobacco Use   Smoking status: Never    Passive exposure: Past   Smokeless tobacco: Never  Vaping Use   Vaping status: Never Used  Substance Use Topics   Alcohol use: Not Currently    Comment: rare wine   Drug use: No     Allergies   Codeine, Penicillins, Tetracycline, Tramadol, Digoxin, Digoxin and related, Erythromycin, Sulfa antibiotics, Tetracyclines & related, Diltiazem hcl, Lyrica [pregabalin], and Sulfites   Review of Systems Review of Systems Per HPI  Physical Exam Triage Vital Signs ED Triage Vitals  Encounter Vitals Group     BP 02/05/23 1526 (!) 178/78     Systolic BP Percentile --      Diastolic BP Percentile --      Pulse Rate 02/05/23 1526 76     Resp 02/05/23 1526 20     Temp 02/05/23 1526 97.8 F (36.6 C)     Temp Source 02/05/23 1526 Oral     SpO2 02/05/23 1526 98 %     Weight --      Height --      Head Circumference --      Peak Flow --      Pain Score 02/05/23 1543 0     Pain Loc --      Pain Education --      Exclude from Growth Chart --    No data found.  Updated Vital Signs BP (!) 150/70 (BP Location: Left Arm)   Pulse 76   Temp 97.8 F (36.6 C) (Oral)   Resp 20   SpO2 98%   Visual Acuity Right Eye Distance:   Left Eye Distance:   Bilateral  Distance:    Right Eye Near:   Left Eye Near:    Bilateral Near:     Physical Exam Constitutional:      General: She is not in acute distress.    Appearance: Normal appearance. She is not toxic-appearing or diaphoretic.  HENT:     Head: Normocephalic and atraumatic.  Eyes:     Extraocular  Movements: Extraocular movements intact.     Conjunctiva/sclera: Conjunctivae normal.  Cardiovascular:     Rate and Rhythm: Normal rate and regular rhythm.     Pulses: Normal pulses.     Heart sounds: Normal heart sounds.  Pulmonary:     Effort: Pulmonary effort is normal. No respiratory distress.     Breath sounds: Normal breath sounds. No stridor. No wheezing, rhonchi or rales.  Skin:    Comments: Patient has maculopapular rash and small clusters present to left forearm and right medial knee.  No drainage noted.  Scalp appears normal but possibly slightly dry.  No lower extremity swelling.  Neurological:     General: No focal deficit present.     Mental Status: She is alert and oriented to person, place, and time. Mental status is at baseline.  Psychiatric:        Mood and Affect: Mood normal.        Behavior: Behavior normal.        Thought Content: Thought content normal.        Judgment: Judgment normal.      UC Treatments / Results  Labs (all labs ordered are listed, but only abnormal results are displayed) Labs Reviewed - No data to display  EKG   Radiology No results found.  Procedures Procedures (including critical care time)  Medications Ordered in UC Medications - No data to display  Initial Impression / Assessment and Plan / UC Course  I have reviewed the triage vital signs and the nursing notes.  Pertinent labs & imaging results that were available during my care of the patient were reviewed by me and considered in my medical decision making (see chart for details).     1.  Chronic cough  Chest x-ray completed that was negative for any acute cardiopulmonary process.  Discussed with patient following up with her PCP for this as she may need pulmonology referral.  Benzonatate prescribed to take as needed for cough.  No signs of bacterial infection on exam that would necessitate antibiotic therapy.  2.  Rash  Suspect some form of allergic versus atopic  contact dermatitis for rash to leg and arm.  Will treat with topical triamcinolone.  No significant rash noted to scalp but scalp appears slightly dry so recommended over-the-counter shampoo such as Selsun Blue to see if this will be helpful.  Encouraged follow-up with dermatology I provided contact information if rashes persist or worsen.  Also discussed with patient monitoring her blood pressure closely at home.  Blood pressure improved prior to discharge.  She is asymptomatic regarding blood pressures to do not think that emergent evaluation is necessary.  Also discussed with patient notifying her PCP that she has recently weaned herself off of Cymbalta. Final Clinical Impressions(s) / UC Diagnoses   Final diagnoses:  Chronic cough  Rash and nonspecific skin eruption     Discharge Instructions      I have prescribed you cough medication to take as needed.  Chest x-ray is pending.  Will call if it is abnormal.  Please follow-up with your primary care doctor to  let them know about this chronic and worsening cough as well as you stopping Cymbalta.  Suspect that you are having some type of allergic reaction to your skin.  I have prescribed you topical cream to apply to area.  Do not apply to scalp or face.  Your scalp appears to be possibly dry.  Recommend Selsun Blue over-the-counter.  Follow-up with dermatology if skin issues persist or worsen.   ED Prescriptions   None    PDMP not reviewed this encounter.

## 2023-02-05 NOTE — Discharge Instructions (Addendum)
I have prescribed you cough medication to take as needed.  Chest x-ray is pending.  Will call if it is abnormal.  Please follow-up with your primary care doctor to let them know about this chronic and worsening cough as well as you stopping Cymbalta.  Suspect that you are having some type of allergic reaction to your skin.  I have prescribed you topical cream to apply to area.  Do not apply to scalp or face.  Your scalp appears to be possibly dry.  Recommend Selsun Blue over-the-counter.  Follow-up with dermatology if skin issues persist or worsen.

## 2023-02-06 ENCOUNTER — Ambulatory Visit: Payer: Self-pay

## 2023-02-08 ENCOUNTER — Telehealth: Payer: Self-pay | Admitting: Internal Medicine

## 2023-02-08 MED ORDER — AZITHROMYCIN 250 MG PO TABS: ORAL_TABLET | ORAL | 0 refills | Status: AC

## 2023-02-08 NOTE — Telephone Encounter (Signed)
Chest x-ray notable for atypical infection versus edema versus interstitial lung disease.  Called patient to discuss x-ray results.  I do have a very low suspicion for edema given physical exam.  Will opt to treat for atypical infection with azithromycin. I am very suspicious of interstitial lung disease given chronicity of symptoms. Patient states that she has sent over x-ray results to her PCP so encouraged her to continue follow-up with PCP and cardiology.  Also discussed strict ER precautions.  Patient states she has taken azithromycin without reaction previously despite allergy to erythromycin.  All questions answered.  Patient verbalized understanding and was agreeable with plan.

## 2023-02-14 ENCOUNTER — Ambulatory Visit: Payer: Medicare Other | Admitting: Neurology

## 2023-02-21 ENCOUNTER — Other Ambulatory Visit: Payer: Self-pay | Admitting: Internal Medicine

## 2023-02-21 DIAGNOSIS — R053 Chronic cough: Secondary | ICD-10-CM

## 2023-02-23 NOTE — Progress Notes (Signed)
NEUROLOGY FOLLOW UP OFFICE NOTE  Natasha [name] [id number]  Assessment/Plan:   Left sided occipital neuralgia/cervicogenic headache Cervical spondylosis Peripheral neuropathy - failed multiple medications (side effects) Restless leg syndrome - taking pramipexole 1mg  at 6-7 PM as managed by PCP Hypertension - elevated today      At this point, she has already tried and failed (ineffective or side effect) to medications that I would prescribe.  Follow up with PCP regarding blood pressure.  She will follow up with me as needed.  Total time spent in chart and face to face with patient:  23 minutes   Subjective:  Natasha [name] is an 89 year old white female with HTN, PAF, chronic diastolic heart failure, DJD, hypothyroidism, polyneuropathy, and restless leg syndrome who follows up for headache   UPDATE: Current medications:  duloxetine 60mg  daily, pramipexole 1mg  between 6-7 PM., lorazepam 0.5-1mg  QHS PRN (to help with sleep)   Increased duloxetine from 30mg  to 60mg  daily to treat occipital neuralgia.  It caused constipation and aggravated her RLS, so she discontinued.    Overall not feeling well.  Back pain is worse.  Also notes dyspnea on exertion, which is hindering her balance and ambulation.  This is being worked up by her other providers.  Toes and hands are sore and painful.      HISTORY: She has longstanding history of migraines when she was younger.  She was on magnesium and riboflavin for many years and migraines were controlled.  However she reports recurrence of headaches a couple of months ago.  It occurs daily.  It is left sided (same side as her previous migraines) involving he left temple and occipital region.  She reports "creepy crawly" feeling in the back of head.  Head sore to touch.  She has chronic neck pain.  Some photophobia but no nausea, phonophobia, or visual aura.  Sometimes wakes up in middle of night with it.  She takes Fioricet (which she has for years) and  maybe lorazepam, which helps.    She has had a very gradual onset of lower extremity weakness beginning around 2016 or 2017.  She feels wobbly on her feet.  Over the past year, she no longer is able to walk up steps with her two feet.  She needs to pull herself, either grabbing the bannister or bending over and climbing up the steps on all fours.  Her legs ache.  She was evaluated by pain management for low back and left hip pain.  She has an MRI of the lumbar spine without contrast on 05/02/2018, which was personally reviewed, and showed multilevel lumbar spondylosis and degenerative endplate marrow edema at L1-L2 with left lateral disc bulge and spurring and mild left L1 foraminal stenosis as well as edema in the medial left psoas muscle at the L1-L2 level.  She was told surgery wasn't an option.       She presented to the ED on 04/07/2019 following a fall in which she struck the back of her head, sustaining a contusion.  CT of head and cervical spine personally reviewed showed no evidence of trauma or other acute abnormality.  No associated dizziness or passing out.  She also reports numbness and tingling involving her fingers and toes.  She feels shooting pain in the right ring finger and left middle finger.  She reports spurs in her neck but denies any significant neck pain.  No weakness in the upper extremities.  She has restless leg syndrome which  has gotten worse over the past several months.  It feels like that her legs are jumping.  She needs to keep moving her legs for relief.  She is starting to notice it earlier in the evening while sitting and watching TV.  She was started on pramipexole 0.125mg  2 to 3 hours before bedtime which initially was helpful but then needed to increase to 0.25mg .  Labs from February 2021 showed B12 346, folate 12.7, TSH 1.09, CK 54, aldolase 5.1, ferritin 65.  SPEP/IFE from 08/01/2019 showed polyclonal increase in IgA but no M-spike.  NCV-EMG of lower extremities on 07/24/2019  demonstrated a chronic sensorimotor axonal polyneuropathy.   Past medications:  Gabapentin (rash), Lyrica (rash), nortriptyline (elevated blood pressure), duloxetine (constipation, aggravated RLS)  PAST MEDICAL HISTORY: Past Medical History:  Diagnosis Date   CHF (congestive heart failure) (HCC)    pt. denies   Chronic diastolic heart failure (HCC) 04/22/2015   Complication of anesthesia    COPD (chronic obstructive pulmonary disease) (HCC)    Diverticular disease    DJD (degenerative joint disease)    Dysphagia    Dysrhythmia    afib   GERD (gastroesophageal reflux disease)    Hiatal hernia    Hypertension    Hypothyroidism    Lower leg edema    Neuromuscular disorder (HCC)    neuropathy   PAF (paroxysmal atrial fibrillation) (HCC)    s/p ablation x several times now with occasional episodes of PAF.   She has failed sotolol/dofetilide/flecainide/propafenone/Amio and dronedarone either with not controlling her PAF or having side effects   Pneumonia    PONV (postoperative nausea and vomiting)    Restless leg syndrome    Seasonal allergies     MEDICATIONS: Current Outpatient Medications on File Prior to Visit  Medication Sig Dispense Refill   acetaminophen (TYLENOL) 500 MG tablet Take 1,000 mg by mouth every 8 (eight) hours as needed for moderate pain.     benzonatate (TESSALON) 100 MG capsule Take 1 capsule (100 mg total) by mouth every 8 (eight) hours as needed for cough. 21 capsule 0   butalbital-acetaminophen-caffeine (FIORICET) 50-325-40 MG tablet Take 1 tablet by mouth 2 (two) times daily as needed for headache.     cyanocobalamin (VITAMIN B12) 1000 MCG tablet Take 1,000 mcg by mouth daily.     DULoxetine (CYMBALTA) 60 MG capsule Take 1 capsule (60 mg total) by mouth daily. 30 capsule 5   ELIQUIS 5 MG TABS tablet TAKE 1 TABLET BY MOUTH TWO TIMES DAILY. 60 tablet 5   esomeprazole (NEXIUM) 20 MG capsule Take 20 mg by mouth daily at 12 noon.     estradiol (CLIMARA - DOSED  IN MG/24 HR) 0.025 mg/24hr patch Place 0.025 mg onto the skin once a week.     furosemide (LASIX) 40 MG tablet TAKE 1 AND 1/2 TABLETS BY MOUTH DAILY. 135 tablet 3   LORazepam (ATIVAN) 1 MG tablet Take 0.5-1 mg by mouth at bedtime as needed for sleep. For sleep     MAGNESIUM PO Take 400 mg by mouth daily.     mupirocin ointment (BACTROBAN) 2 % Apply 1 application topically 2 (two) times daily as needed (wound care).     OVER THE COUNTER MEDICATION Apply 1 application topically at bedtime as needed (pain). Theraworks pain relief cream     potassium chloride (KLOR-CON) 10 MEQ tablet TAKE 1 TABLET BY MOUTH DAILY 90 tablet 3   pramipexole (MIRAPEX) 1 MG tablet Take 1 mg by mouth at  bedtime.     Riboflavin 400 MG CAPS Take 400 mg by mouth daily.     sennosides-docusate sodium (SENOKOT-S) 8.6-50 MG tablet Take 1 tablet by mouth daily as needed for constipation.     SYNTHROID 125 MCG tablet Take 125 mcg by mouth every morning.     triamcinolone cream (KENALOG) 0.1 % Apply 1 Application topically 2 (two) times daily. Do not apply to scalp or face. 30 g 0   valsartan (DIOVAN) 80 MG tablet Take 40 mg by mouth daily as needed (Take 40 mg if blood pressure is over 130).     verapamil (CALAN-SR) 120 MG CR tablet Take 120 mg by mouth daily as needed (Tachycardia).     Vitamin D, Cholecalciferol, 50 MCG (2000 UT) CAPS Take 2,000 Units by mouth daily.     No current facility-administered medications on file prior to visit.     ALLERGIES: Allergies  Allergen Reactions   Codeine Nausea And Vomiting and Nausea Only   Penicillins Hives    Has patient had a PCN reaction causing immediate rash, facial/tongue/throat swelling, SOB or lightheadedness with hypotension: No Has patient had a PCN reaction causing severe rash involving mucus membranes or skin necrosis: No Has patient had a PCN reaction that required hospitalization No Has patient had a PCN reaction occurring within the last 10 years: No If all of the  above answers are "NO", then may proceed with Cephalosporin use.   Tetracycline Rash   Tramadol Nausea Only   Digoxin Other (See Comments)    Flushed neck and face   Digoxin And Related Other (See Comments)    ' Makes my face blood red"   Erythromycin Other (See Comments) and Hives    Flushing on face   Sulfa Antibiotics Other (See Comments)    PRESERVATIVE OF SOME FOODS- MAKES FLUSHES TO FACE AND NECK   Tetracyclines & Related Other (See Comments)    "Sores on legs"   Diltiazem Hcl Rash    Rash with long acting form   Lyrica [Pregabalin] Rash   Sulfites Rash    PRESERVATIVE OF SOME FOODS- MAKES FLUSHES TO FACE AND NECK PRESERVATIVE OF SOME FOODS- MAKES FLUSHES TO FACE AND NECK    FAMILY HISTORY: Family History  Problem Relation Age of Onset   Transient ischemic attack Mother    Heart failure Father    CVA Father    Lung cancer Father    Hypertension Sister    Cancer Brother    Sudden death Brother    Anesthesia problems Neg Hx    Hypotension Neg Hx    Malignant hyperthermia Neg Hx    Pseudochol deficiency Neg Hx    Heart attack Neg Hx       Objective:  Blood pressure (!) 161/81, pulse 76, height 5\' 9"  (1.753 m), weight 194 lb (88 kg), SpO2 98%. General: No acute distress.  Patient appears well-groomed.   Head:  Normocephalic/atraumatic Eyes:  Fundi examined but not visualized Neck: supple, no paraspinal tenderness, full range of motion Heart:  Regular rate and rhythm Neurological Exam: alert and oriented.  Speech fluent and not dysarthric, language intact.  CN II-XII intact. Bulk and tone normal, muscle strength 5/5 throughout.  Sensation to light touch intact.  Deep tendon reflexes 1+ throughout.  Finger to nose testing intact.  Cautious, antalgic.  Romberg with sway.   Shon Millet, DO  CC: Hillard Danker, MD

## 2023-02-24 ENCOUNTER — Encounter: Payer: Self-pay | Admitting: Neurology

## 2023-02-24 ENCOUNTER — Ambulatory Visit (INDEPENDENT_AMBULATORY_CARE_PROVIDER_SITE_OTHER): Payer: Medicare Other | Admitting: Neurology

## 2023-02-24 VITALS — BP 161/81 | HR 76 | Ht 69.0 in | Wt 194.0 lb

## 2023-02-24 DIAGNOSIS — M5481 Occipital neuralgia: Secondary | ICD-10-CM | POA: Diagnosis not present

## 2023-02-24 DIAGNOSIS — M47812 Spondylosis without myelopathy or radiculopathy, cervical region: Secondary | ICD-10-CM | POA: Diagnosis not present

## 2023-02-24 DIAGNOSIS — I10 Essential (primary) hypertension: Secondary | ICD-10-CM | POA: Diagnosis not present

## 2023-02-24 DIAGNOSIS — M47816 Spondylosis without myelopathy or radiculopathy, lumbar region: Secondary | ICD-10-CM

## 2023-02-24 DIAGNOSIS — G2581 Restless legs syndrome: Secondary | ICD-10-CM

## 2023-02-24 DIAGNOSIS — G629 Polyneuropathy, unspecified: Secondary | ICD-10-CM

## 2023-03-04 DIAGNOSIS — Z23 Encounter for immunization: Secondary | ICD-10-CM | POA: Diagnosis not present

## 2023-03-09 ENCOUNTER — Ambulatory Visit
Admission: RE | Admit: 2023-03-09 | Discharge: 2023-03-09 | Disposition: A | Discharge status: Home / Self Care | Payer: Medicare Other | Source: Ambulatory Visit | Attending: Internal Medicine | Admitting: Internal Medicine

## 2023-03-09 DIAGNOSIS — R053 Chronic cough: Secondary | ICD-10-CM

## 2023-03-09 DIAGNOSIS — K449 Diaphragmatic hernia without obstruction or gangrene: Secondary | ICD-10-CM | POA: Diagnosis not present

## 2023-03-09 DIAGNOSIS — R918 Other nonspecific abnormal finding of lung field: Secondary | ICD-10-CM | POA: Diagnosis not present

## 2023-03-09 MED ORDER — IOPAMIDOL (ISOVUE-300) INJECTION 61%
500.0000 mL | Freq: Once | INTRAVENOUS | Status: AC | PRN
  Administered 2023-03-09: 75 mL via INTRAVENOUS

## 2023-03-10 DIAGNOSIS — R0602 Shortness of breath: Secondary | ICD-10-CM | POA: Diagnosis not present

## 2023-03-10 DIAGNOSIS — I10 Essential (primary) hypertension: Secondary | ICD-10-CM | POA: Diagnosis not present

## 2023-03-10 DIAGNOSIS — R2689 Other abnormalities of gait and mobility: Secondary | ICD-10-CM | POA: Diagnosis not present

## 2023-03-10 DIAGNOSIS — K5909 Other constipation: Secondary | ICD-10-CM | POA: Diagnosis not present

## 2023-03-10 DIAGNOSIS — R9389 Abnormal findings on diagnostic imaging of other specified body structures: Secondary | ICD-10-CM | POA: Diagnosis not present

## 2023-03-10 DIAGNOSIS — I5032 Chronic diastolic (congestive) heart failure: Secondary | ICD-10-CM | POA: Diagnosis not present

## 2023-04-28 DIAGNOSIS — L309 Dermatitis, unspecified: Secondary | ICD-10-CM | POA: Diagnosis not present

## 2023-05-05 ENCOUNTER — Ambulatory Visit: Payer: Medicare Other | Attending: Internal Medicine | Admitting: Internal Medicine

## 2023-05-05 ENCOUNTER — Encounter: Payer: Self-pay | Admitting: Internal Medicine

## 2023-05-05 VITALS — BP 122/84 | HR 74 | Ht 69.0 in | Wt 190.0 lb

## 2023-05-05 DIAGNOSIS — I10 Essential (primary) hypertension: Secondary | ICD-10-CM | POA: Diagnosis not present

## 2023-05-05 DIAGNOSIS — I480 Paroxysmal atrial fibrillation: Secondary | ICD-10-CM | POA: Diagnosis not present

## 2023-05-05 NOTE — Progress Notes (Signed)
HPI Natasha [name] returns today for follow-up of paroxysmal atrial fibrillation, and hypertension. She is status post ablation remotely twice. She was placed on amiodarone secondary to recurrent atrial fibrillation and flutter. This was discontinued secondary to severe fatigue and malaise. She has developed worsening musculoskeletal pain and is considering knee replacement surgery. She has not fallen. She denies syncope or chest pain. Minimal shortness of breath except when she goes out of rhythm. She has not have any atrial fib since her last visit.  Allergies  Allergen Reactions   Codeine Nausea And Vomiting and Nausea Only   Penicillins Hives    Has patient had a PCN reaction causing immediate rash, facial/tongue/throat swelling, SOB or lightheadedness with hypotension: No Has patient had a PCN reaction causing severe rash involving mucus membranes or skin necrosis: No Has patient had a PCN reaction that required hospitalization No Has patient had a PCN reaction occurring within the last 10 years: No If all of the above answers are "NO", then may proceed with Cephalosporin use.   Tetracycline Rash   Tramadol Nausea Only   Digoxin Other (See Comments)    Flushed neck and face   Digoxin And Related Other (See Comments)    ' Makes my face blood red"   Erythromycin Other (See Comments) and Hives    Flushing on face   Sulfa Antibiotics Other (See Comments)    PRESERVATIVE OF SOME FOODS- MAKES FLUSHES TO FACE AND NECK   Tetracyclines & Related Other (See Comments)    "Sores on legs"   Diltiazem Hcl Rash    Rash with long acting form   Lyrica [Pregabalin] Rash   Sulfites Rash    PRESERVATIVE OF SOME FOODS- MAKES FLUSHES TO FACE AND NECK PRESERVATIVE OF SOME FOODS- MAKES FLUSHES TO FACE AND NECK     Current Outpatient Medications  Medication Sig Dispense Refill   acetaminophen (TYLENOL) 500 MG tablet Take 1,000 mg by mouth every 8 (eight) hours as needed for moderate pain.      augmented betamethasone dipropionate (DIPROLENE-AF) 0.05 % cream Apply topically daily as needed.     butalbital-acetaminophen-caffeine (FIORICET) 50-325-40 MG tablet Take 1 tablet by mouth 2 (two) times daily as needed for headache.     cyanocobalamin (VITAMIN B12) 1000 MCG tablet Take 1,000 mcg by mouth daily.     ELIQUIS 5 MG TABS tablet TAKE 1 TABLET BY MOUTH TWO TIMES DAILY. 60 tablet 5   esomeprazole (NEXIUM) 20 MG capsule Take 20 mg by mouth daily at 12 noon.     estradiol (CLIMARA - DOSED IN MG/24 HR) 0.025 mg/24hr patch Place 0.025 mg onto the skin once a week.     furosemide (LASIX) 40 MG tablet TAKE 1 AND 1/2 TABLETS BY MOUTH DAILY. 135 tablet 3   LORazepam (ATIVAN) 1 MG tablet Take 0.5-1 mg by mouth at bedtime as needed for sleep. For sleep     MAGNESIUM PO Take 400 mg by mouth daily.     mupirocin ointment (BACTROBAN) 2 % Apply 1 application topically 2 (two) times daily as needed (wound care).     OVER THE COUNTER MEDICATION Apply 1 application topically at bedtime as needed (pain). [employer]raworks pain relief cream     potassium chloride (KLOR-CON) 10 MEQ tablet TAKE 1 TABLET BY MOUTH DAILY 90 tablet 3   pramipexole (MIRAPEX) 1 MG tablet Take 1 mg by mouth at bedtime.     Riboflavin 400 MG CAPS Take 400 mg by mouth daily.  sennosides-docusate sodium (SENOKOT-S) 8.6-50 MG tablet Take 1 tablet by mouth daily as needed for constipation.     SYNTHROID 125 MCG tablet Take 125 mcg by mouth every morning.     valsartan (DIOVAN) 80 MG tablet Take 40 mg by mouth daily as needed (Take 40 mg if blood pressure is over 130).     verapamil (CALAN-SR) 120 MG CR tablet Take 120 mg by mouth daily as needed (Tachycardia).     Vitamin D, Cholecalciferol, 50 MCG (2000 UT) CAPS Take 2,000 Units by mouth daily.     No current facility-administered medications for this visit.     Past Medical History:  Diagnosis Date   CHF (congestive heart failure) (HCC)    pt. denies   Chronic diastolic heart  failure (HCC) 29/52/8413   Complication of anesthesia    COPD (chronic obstructive pulmonary disease) (HCC)    Diverticular disease    DJD (degenerative joint disease)    Dysphagia    Dysrhythmia    afib   GERD (gastroesophageal reflux disease)    Hiatal hernia    Hypertension    Hypothyroidism    Lower leg edema    Neuromuscular disorder (HCC)    neuropathy   PAF (paroxysmal atrial fibrillation) (HCC)    s/p ablation x several times now with occasional episodes of PAF.   She has failed sotolol/dofetilide/flecainide/propafenone/Amio and dronedarone either with not controlling her PAF or having side effects   Pneumonia    PONV (postoperative nausea and vomiting)    Restless leg syndrome    Seasonal allergies     ROS:   All systems reviewed and negative except as noted in the HPI.   Past Surgical History:  Procedure Laterality Date   ABDOMINAL HYSTERECTOMY     APPENDECTOMY     ATRIAL FIBRILLATION ABLATION  09/06/2013   2'14 -[facility]   BALLOON DILATION  03/03/2011   Procedure: Marvis Repress DILATION;  Surgeon: Charolett Bumpers, MD;  Location: [facility];  Service: Endoscopy;  Laterality: N/A;   bladder polyps     BREAST EXCISIONAL BIOPSY Left    BREAST SURGERY     left lumpectomy   CARDIOVERSION     x3   CARDIOVERSION N/A 12/30/2014   Procedure: CARDIOVERSION;  Surgeon: Lewayne Bunting, MD;  Location: [facility] ENDOSCOPY;  Service: Cardiovascular;  Laterality: N/A;   CARDIOVERSION N/A 04/23/2015   Procedure: CARDIOVERSION;  Surgeon: Thurmon Fair, MD;  Location: [facility] ENDOSCOPY;  Service: Cardiovascular;  Laterality: N/A;   CATARACT EXTRACTION Left    COLONOSCOPY WITH PROPOFOL N/A 09/24/2013   Procedure: COLONOSCOPY WITH PROPOFOL;  Surgeon: Charolett Bumpers, MD;  Location: [facility] ENDOS[facility];  Service: Endoscopy;  Laterality: N/A;   ESOPHAGOGASTRODUODENOSCOPY  03/03/2011   Procedure: ESOPHAGOGASTRODUODENOSCOPY (EGD);  Surgeon: Charolett Bumpers, MD;  Location: [facility];   Service: Endoscopy;  Laterality: N/A;   ESOPHAGOGASTRODUODENOSCOPY (EGD) WITH PROPOFOL Bilateral 03/04/2022   Procedure: ESOPHAGOGASTRODUODENOSCOPY (EGD) WITH PROPOFOL;  Surgeon: Vida Rigger, MD;  Location: [facility] ENDOSCOPY;  Service: Gastroenterology;  Laterality: Bilateral;   EYE SURGERY     cataracat   RHINOPLASTY     RIGHT HEART CATHETERIZATION N/A 07/11/2014   Procedure: RIGHT HEART CATH;  Surgeon: Laurey Morale, MD;  Location: [facility] CATH LAB;  Service: Cardiovascular;  Laterality: N/A;   ROTATOR CUFF REPAIR     THYROIDECTOMY  1973   TONSILLECTOMY  1942   TOTAL KNEE ARTHROPLASTY Right 10/28/2020   Procedure: TOTAL KNEE ARTHROPLASTY;  Surgeon: Eugenia Mcalpine, MD;  Location:  [facility] ORS;  Service: Orthopedics;  Laterality: Right;  with adductor canal   TUBAL LIGATION       Family History  Problem Relation Age of Onset   Transient ischemic attack Mother    Heart failure Father    CVA Father    Lung cancer Father    Hypertension Sister    Cancer Brother    Sudden death Brother    Anesthesia problems Neg Hx    Hypotension Neg Hx    Malignant hyperthermia Neg Hx    Pseudochol deficiency Neg Hx    Heart attack Neg Hx      Social History   Socioeconomic History   Marital status: Married    Spouse name: Not on file   Number of children: Not on file   Years of education: Not on file   Highest education level: Not on file  Occupational History   Occupation: retired  Tobacco Use   Smoking status: Never    Passive exposure: Past   Smokeless tobacco: Never  Vaping Use   Vaping status: Never Used  Substance and Sexual Activity   Alcohol use: Not Currently    Comment: rare wine   Drug use: No   Sexual activity: Not Currently    Birth control/protection: Post-menopausal  Other Topics Concern   Not on file  Social History Narrative   Right handed   Two story home   Drinks caffeine   Social Drivers of Health   Financial Resource Strain: Not on file  Food Insecurity: Not on  file  Transportation Needs: Not on file  Physical Activity: Not on file  Stress: Not on file  Social Connections: Not on file  Intimate Partner Violence: Not on file     BP 122/84   Pulse 74   Ht 5\' 9"  (1.753 m)   Wt 190 lb (86.2 kg)   SpO2 98%   BMI 28.06 kg/m   Physical Exam:  Well appearing NAD HEENT: Unremarkable Neck:  No JVD, no thyromegally Lymphatics:  No adenopathy Back:  No CVA tenderness Lungs:  Clear HEART:  Regular rate rhythm, no murmurs, no rubs, no clicks Abd:  soft, positive bowel sounds, no organomegally, no rebound, no guarding Ext:  2 plus pulses, no edema, no cyanosis, no clubbing Skin:  No rashes no nodules Neuro:  CN II through XII intact, motor grossly intact  EKG - nsr with ILBBB    Assess/Plan:  PAF - she is maintaining NSR. No change in meds. 2. HTN - her bp is well controlled.  3. Diastolic heart failure - her symptoms are class 2A when she is in rhythm and 3B in atrial fib. Continue current treatment. 4. Coags - she has not had any bleeding on systemic anti-coagulation.   Sharlot Gowda April Colter,MD

## 2023-05-05 NOTE — Patient Instructions (Addendum)

## 2023-05-11 DIAGNOSIS — M5416 Radiculopathy, lumbar region: Secondary | ICD-10-CM | POA: Diagnosis not present

## 2023-05-11 DIAGNOSIS — M51362 Other intervertebral disc degeneration, lumbar region with discogenic back pain and lower extremity pain: Secondary | ICD-10-CM | POA: Diagnosis not present

## 2023-05-11 DIAGNOSIS — I4891 Unspecified atrial fibrillation: Secondary | ICD-10-CM | POA: Insufficient documentation

## 2023-05-11 DIAGNOSIS — D6869 Other thrombophilia: Secondary | ICD-10-CM | POA: Insufficient documentation

## 2023-05-15 ENCOUNTER — Other Ambulatory Visit: Payer: Self-pay | Admitting: Internal Medicine

## 2023-05-26 ENCOUNTER — Ambulatory Visit: Payer: Medicare Other | Attending: Cardiology | Admitting: Cardiology

## 2023-05-26 ENCOUNTER — Encounter: Payer: Self-pay | Admitting: Cardiology

## 2023-05-26 VITALS — BP 130/80 | HR 80 | Ht 68.0 in | Wt 193.0 lb

## 2023-05-26 DIAGNOSIS — Z79899 Other long term (current) drug therapy: Secondary | ICD-10-CM | POA: Diagnosis present

## 2023-05-26 DIAGNOSIS — I10 Essential (primary) hypertension: Secondary | ICD-10-CM | POA: Diagnosis not present

## 2023-05-26 DIAGNOSIS — I4819 Other persistent atrial fibrillation: Secondary | ICD-10-CM | POA: Diagnosis not present

## 2023-05-26 DIAGNOSIS — R0600 Dyspnea, unspecified: Secondary | ICD-10-CM

## 2023-05-26 DIAGNOSIS — I5032 Chronic diastolic (congestive) heart failure: Secondary | ICD-10-CM

## 2023-05-26 MED ORDER — METOPROLOL TARTRATE 100 MG PO TABS: 100.0000 mg | ORAL_TABLET | Freq: Once | ORAL | 0 refills | Status: DC

## 2023-05-26 NOTE — Progress Notes (Signed)
 Date:  05/26/2023   ID:  Natasha [name], DOB [birth year], MRN [id number] The patient was identified using 2 identifiers.  PCP:  Dwight Trula SQUIBB, MD   [facility] Providers Cardiologist:  Wilbert Bihari, MD Electrophysiologist:  Danelle Birmingham, MD     Evaluation Performed:  Follow-Up Visit  Chief Complaint:  atrial fibrillation, HTN  History of Present Illness:    Natasha [name] is a 89 y.o. female with a hx of persistent atrial fibrillation with 2 failed ablations at [facility] and multiple antiarrythmic intolerances (Multaq , Rythmol, flecaide, tikosyn, and pt refused amiodarone ) , HTN, hypothyroidism, mild pulmonary hypertension ( ) and chronic diastolic dysfunction.     She has been seen at [facility] as well as in our office by Dr. Birmingham with EP who recommended 3 options-->continue current course and prn bystolic  5 mg, begin amiodarone  ( which she does not wish to do)  and AV node ablaltion with PPM. She chose to continue current meds with  verapmil, bystolic  and prn bystolic . She was seen back by Dr. Birmingham and was having fatigue that was felt to possibly be due to her afib meds and it was recommended that her evening dose of Bystolic  be stopped but she decided not to stop it.   Ultimately, Amio was stopped due to side effects.    She was seen by Dr. Birmingham in December 2023 and was doing well.  She was maintaining normal sinus rhythm.  She does have chronic diastolic CHF and was felt to be class IIa but does get very symptomatic when she goes into A-fib.  No changes were made to her medications at that time.  She is here today for followup and is doing well.  She has had problems with DOE but had a Chest CT that showed ground glass opacities and has an appt with Pulmonary.  She also has a large hiatal hernia that likely contributing to her SOB.  She had a stress test in 2023 that was fine but her SOB has increased since then. She denies any chest pain or pressure, PND, orthopnea, LE edema,  dizziness, palpitations or syncope. SHe is compliant with her meds and is tolerating meds with no SE.    Past Medical History:  Diagnosis Date   CHF (congestive heart failure) (HCC)    pt. denies   Chronic diastolic heart failure (HCC) 04/22/2015   Complication of anesthesia    COPD (chronic obstructive pulmonary disease) (HCC)    Diverticular disease    DJD (degenerative joint disease)    Dysphagia    Dysrhythmia    afib   GERD (gastroesophageal reflux disease)    Hiatal hernia    Hypertension    Hypothyroidism    Lower leg edema    Neuromuscular disorder (HCC)    neuropathy   PAF (paroxysmal atrial fibrillation) (HCC)    s/p ablation x several times now with occasional episodes of PAF.   She has failed sotolol/dofetilide/flecainide/propafenone/Amio and dronedarone either with not controlling her PAF or having side effects   Pneumonia    PONV (postoperative nausea and vomiting)    Restless leg syndrome    Seasonal allergies    Past Surgical History:  Procedure Laterality Date   ABDOMINAL HYSTERECTOMY     APPENDECTOMY     ATRIAL FIBRILLATION ABLATION  09/06/2013   2'14 -[facility]   BALLOON DILATION  03/03/2011   Procedure: BALLOON DILATION;  Surgeon: Gladis MARLA Louder, MD;  Location: [facility] END[facility];  Service: Endoscopy;  Laterality: N/A;   bladder polyps     BREAST EXCISIONAL BIOPSY Left    BREAST SURGERY     left lumpectomy   CARDIOVERSION     x3   CARDIOVERSION N/A 12/30/2014   Procedure: CARDIOVERSION;  Surgeon: Redell GORMAN Shallow, MD;  Location: [facility] ENDOSCOPY;  Service: Cardiovascular;  Laterality: N/A;   CARDIOVERSION N/A 04/23/2015   Procedure: CARDIOVERSION;  Surgeon: Jerel Balding, MD;  Location: [facility] ENDOSCOPY;  Service: Cardiovascular;  Laterality: N/A;   CATARACT EXTRACTION Left    COLONOSCOPY WITH PROPOFOL  N/A 09/24/2013   Procedure: COLONOSCOPY WITH PROPOFOL ;  Surgeon: Gladis MARLA Louder, MD;  Location: [facility];  Service: Endoscopy;  Laterality: N/A;    ESOPHAGOGASTRODUODENOSCOPY  03/03/2011   Procedure: ESOPHAGOGASTRODUODENOSCOPY (EGD);  Surgeon: Gladis MARLA Louder, MD;  Location: [facility];  Service: Endoscopy;  Laterality: N/A;   ESOPHAGOGASTRODUODENOSCOPY (EGD) WITH PROPOFOL  Bilateral 03/04/2022   Procedure: ESOPHAGOGASTRODUODENOSCOPY (EGD) WITH PROPOFOL ;  Surgeon: Rosalie Kitchens, MD;  Location: [facility];  Service: Gastroenterology;  Laterality: Bilateral;   EYE SURGERY     cataracat   RHINOPLASTY     RIGHT HEART CATHETERIZATION N/A 07/11/2014   Procedure: RIGHT HEART CATH;  Surgeon: Ezra GORMAN Shuck, MD;  Location: [facility] CATH LAB;  Service: Cardiovascular;  Laterality: N/A;   ROTATOR CUFF REPAIR     THYROIDECTOMY  1973   TONSILLECTOMY  1942   TOTAL KNEE ARTHROPLASTY Right 10/28/2020   Procedure: TOTAL KNEE ARTHROPLASTY;  Surgeon: Gerome Charleston, MD;  Location: [facility];  Service: Orthopedics;  Laterality: Right;  with adductor canal   TUBAL LIGATION       Current Meds  Medication Sig   acetaminophen  (TYLENOL ) 500 MG tablet Take 1,000 mg by mouth every 8 (eight) hours as needed for moderate pain.   augmented betamethasone dipropionate (DIPROLENE-AF) 0.05 % cream Apply topically daily as needed.   butalbital-acetaminophen -caffeine (FIORICET) 50-325-40 MG tablet Take 1 tablet by mouth 2 (two) times daily as needed for headache.   cyanocobalamin (VITAMIN B12) 1000 MCG tablet Take 1,000 mcg by mouth daily.   ELIQUIS  5 MG TABS tablet TAKE 1 TABLET BY MOUTH TWO TIMES DAILY.   esomeprazole (NEXIUM) 20 MG capsule Take 20 mg by mouth daily at 12 noon.   estradiol  (CLIMARA  - DOSED IN MG/24 HR) 0.025 mg/24hr patch Place 0.025 mg onto the skin once a week.   furosemide  (LASIX ) 40 MG tablet TAKE 1 AND 1/2 TABLETS BY MOUTH DAILY. (Patient taking differently: Take 40 mg by mouth as needed.)   LORazepam  (ATIVAN ) 1 MG tablet Take 0.5-1 mg by mouth at bedtime as needed for sleep. For sleep   MAGNESIUM  PO Take 400 mg by mouth daily.   mupirocin ointment  (BACTROBAN) 2 % Apply 1 application topically 2 (two) times daily as needed (wound care).   OVER THE COUNTER MEDICATION Apply 1 application topically at bedtime as needed (pain). [employer] pain relief cream   potassium chloride  (KLOR-CON ) 10 MEQ tablet TAKE 1 TABLET BY MOUTH DAILY   pramipexole  (MIRAPEX ) 1 MG tablet Take 1 mg by mouth at bedtime.   Riboflavin 400 MG CAPS Take 400 mg by mouth daily.   sennosides-docusate sodium  (SENOKOT-S) 8.6-50 MG tablet Take 1 tablet by mouth daily as needed for constipation.   SYNTHROID  125 MCG tablet Take 125 mcg by mouth every morning.   valsartan (DIOVAN) 80 MG tablet Take 40 mg by mouth daily as needed (Take 40 mg if blood pressure is over 130).   verapamil  (CALAN -SR) 120 MG CR tablet  Take 120 mg by mouth daily as needed (Tachycardia).   Vitamin D, Cholecalciferol, 50 MCG (2000 UT) CAPS Take 2,000 Units by mouth daily.     Allergies:   Codeine, Penicillins, Tetracycline, Tramadol, Digoxin , Digoxin  and related, Erythromycin, Sulfa antibiotics, Tetracyclines & related, Diltiazem  hcl, Lyrica  [pregabalin ], and Sulfites   Social History   Tobacco Use   Smoking status: Never    Passive exposure: Past   Smokeless tobacco: Never  Vaping Use   Vaping status: Never Used  Substance Use Topics   Alcohol use: Not Currently    Comment: rare wine   Drug use: No     Family Hx: The patient's family history includes CVA in her father; Cancer in her brother; Heart failure in her father; Hypertension in her sister; Lung cancer in her father; Sudden death in her brother; Transient ischemic attack in her mother. There is no history of Anesthesia problems, Hypotension, Malignant hyperthermia, Pseudochol deficiency, or Heart attack.  ROS:   Please see the history of present illness.     All other systems reviewed and are negative.   Prior CV studies:   The following studies were reviewed today:  None  Labs/Other Tests and Data Reviewed:     Recent  Labs: No results found for requested labs within last 365 days.   Recent Lipid Panel No results found for: CHOL, TRIG, HDL, CHOLHDL, LDLCALC, LDLDIRECT  Wt Readings from Last 3 Encounters:  05/26/23 193 lb (87.5 kg)  05/05/23 190 lb (86.2 kg)  02/24/23 194 lb (88 kg)     Risk Assessment/Calculations:    CHA2DS2-VASc Score =     This indicates a  % annual risk of stroke. The patient's score is based upon:           Objective:    Vital Signs:  BP 130/80   Pulse 80   Ht 5' 8 (1.727 m)   Wt 193 lb (87.5 kg)   BMI 29.35 kg/m    GEN: Well nourished, well developed in no acute distress HEENT: Normal NECK: No JVD; No carotid bruits LYMPHATICS: No lymphadenopathy CARDIAC:irregularly irregular, no murmurs, rubs, gallops RESPIRATORY:  Clear to auscultation without rales, wheezing or rhonchi  ABDOMEN: Soft, non-tender, non-distended MUSCULOSKELETAL:  No edema; No deformity  SKIN: Warm and dry NEUROLOGIC:  Alert and oriented x 3 PSYCHIATRIC:  Normal affect  ASSESSMENT & PLAN:    1.  Chronic diastolic CHF -She has chronic SOB but seems to be worse recently -she is NYHA class 2a-2b -recent Chest CT showed ground glass opacities and has an appt to see Pulmonary -she does not appear volume overloaded -Continue prescription drug management with Lasix  40 mg daily with as needed refills -she has been having worsening SOB but has an abnormality Chest CT with ground glass opacities after having COVID and also has a large hiatal hernia which is likely contributing -Check BMET/BNP -I think we should do a coronary CTA to define coronary anatomy>>her EKG compared to 2023 shows a new iLBBB -repeat 2D echo   2. Hypertension -BP adequately controlled on exam today -Continue prescription drug management with valsartan 40 mg daily as needed if systolic blood pressure greater than 130  3.  Persistent atrial fibrillation -Heart rate is controlled in A-fib and has not had any  significant palpitations -Amio, BB and CCB stopped due to side effects -No bleeding problems on DOAC -Continue prescription drug management with Eliquis  5 mg twice daily as well as verapamil  SR 120 mg daily as  needed for palpitations -Check CBC and bmet    Time:   Today, I have spent 20 minutes with the patient with telehealth technology discussing the above problems.    Followup with me in 1 year   Medication Adjustments/Labs and Tests Ordered: Current medicines are reviewed at length with the patient today.  Concerns regarding medicines are outlined above.   Tests Ordered: No orders of the defined types were placed in this encounter.   Medication Changes: No orders of the defined types were placed in this encounter.   Follow Up:  In Person in 1 year(s)  Signed, Wilbert Bihari, MD  05/26/2023 3:02 PM    [facility]

## 2023-05-26 NOTE — Addendum Note (Signed)
 Addended by: Cherylyn Cos on: 05/26/2023 03:25 PM   Modules accepted: Orders

## 2023-05-26 NOTE — Patient Instructions (Addendum)
 Medication Instructions:  Your physician recommends that you continue on your current medications as directed. Please refer to the Current Medication list given to you today.  *If you need a refill on your cardiac medications before your next appointment, please call your pharmacy*   Lab Work: Please complete a BNP, BMET, CBC, TSH at the [facility] on the first floor of our building today before you leave. You do not need to be fasting for these labs.  If you have labs (blood work) drawn today and your tests are completely normal, you will receive your results only by: MyChart Message (if you have MyChart) OR A paper copy in the mail If you have any lab test that is abnormal or we need to change your treatment, we will call you to review the results.   Testing/Procedures: Your physician has requested that you have an echocardiogram. Echocardiography is a painless test that uses sound waves to create images of your heart. It provides your doctor with information about the size and shape of your heart and how well your heart's chambers and valves are working. This procedure takes approximately one hour. There are no restrictions for this procedure. Please do NOT wear cologne, perfume, aftershave, or lotions (deodorant is allowed). Please arrive 15 minutes prior to your appointment time.  Please note: We ask at that you not bring children with you during ultrasound (echo/ vascular) testing. Due to room size and safety concerns, children are not allowed in the ultrasound rooms during exams. Our front office staff cannot provide observation of children in our lobby area while testing is being conducted. An adult accompanying a patient to their appointment will only be allowed in the ultrasound room at the discretion of the ultrasound technician under special circumstances. We apologize for any inconvenience.    Your cardiac CT will be scheduled at:   [facility] [address] [place], [place] [address] [phone]  Please arrive at the [facility] and Children's Entrance (Entrance C2) of [facility] 30 minutes prior to test start time. You can use the FREE valet parking offered at entrance C (encouraged to control the heart rate for the test)  Proceed to the [facility] Radiology Department (first floor) to check-in and test prep.  All radiology patients and guests should use entrance C2 at [facility], accessed from [facility], even though the hospital's physical address listed is [address].     Please follow these instructions carefully (unless otherwise directed):  An IV will be required for this test and Nitroglycerin  will be given.   On the Night Before the Test: Be sure to Drink plenty of water. Do not consume any caffeinated/decaffeinated beverages or chocolate 12 hours prior to your test. Do not take any antihistamines 12 hours prior to your test.  On the Day of the Test: Drink plenty of water until 1 hour prior to the test. Do not eat any food 1 hour prior to test. You may take your regular medications prior to the test.  Take 100 mg of metoprolol  (Lopressor ) two hours prior to test. If you take Furosemide /Hydrochlorothiazide/Spironolactone/Chlorthalidone, please HOLD on the morning of the test. FEMALES- please wear underwire-free bra if available, avoid dresses & tight clothing  After the Test: Drink plenty of water. After receiving IV contrast, you may experience a mild flushed feeling. This is normal. On occasion, you may experience a mild rash up to 24 hours after the test. This is not  dangerous. If this occurs, you can take Benadryl  25 mg, Zyrtec, Claritin, or Allegra and increase your fluid intake. (Patients taking Tikosyn should avoid Benadryl , and may take Zyrtec, Claritin, or Allegra) If you experience trouble breathing, this can be serious. If it is severe call 911 IMMEDIATELY. If it is mild,  please call our office.  We will call to schedule your test 2-4 weeks out understanding that some insurance companies will need an authorization prior to the service being performed.   For more information and frequently asked questions, please visit our website : [email or web address]  For non-scheduling related questions, please contact the cardiac imaging nurse navigator should you have any questions/concerns: Cardiac Imaging Nurse Navigators Direct Office Dial: [phone]   For scheduling needs, including cancellations and rescheduling, please call Brittany, [phone].    Follow-Up: At [facility], you and your health needs are our priority.  As part of our continuing mission to provide you with exceptional heart care, we have created designated Provider Care Teams.  These Care Teams include your primary Cardiologist (physician) and Advanced Practice Providers (APPs -  Physician Assistants and Nurse Practitioners) who all work together to provide you with the care you need, when you need it.  We recommend signing up for the patient portal called MyChart.  Sign up information is provided on this After Visit Summary.  MyChart is used to connect with patients for Virtual Visits (Telemedicine).  Patients are able to view lab/test results, encounter notes, upcoming appointments, etc.  Non-urgent messages can be sent to your provider as well.   To learn more about what you can do with MyChart, go to [email or web address].    Your next appointment:   1 year(s)  Provider:   Wilbert Bihari, MD

## 2023-05-27 LAB — TSH: TSH: 2.08 u[IU]/mL (ref 0.450–4.500)

## 2023-05-27 LAB — BASIC METABOLIC PANEL
BUN/Creatinine Ratio: 18 (ref 12–28)
BUN: 15 mg/dL (ref 8–27)
CO2: 20 mmol/L (ref 20–29)
Calcium: 9 mg/dL (ref 8.7–10.3)
Chloride: 96 mmol/L (ref 96–106)
Creatinine, Ser: 0.83 mg/dL (ref 0.57–1.00)
Glucose: 84 mg/dL (ref 70–99)
Potassium: 4.4 mmol/L (ref 3.5–5.2)
Sodium: 132 mmol/L — ABNORMAL LOW (ref 134–144)
eGFR: 68 mL/min/{1.73_m2} (ref >59)

## 2023-05-27 LAB — CBC WITH DIFFERENTIAL/PLATELET
Basophils Absolute: 0 10*3/uL (ref 0.0–0.2)
Basos: 1 %
EOS (ABSOLUTE): 0.1 10*3/uL (ref 0.0–0.4)
Eos: 2 %
Hematocrit: 40.2 % (ref 34.0–46.6)
Hemoglobin: 13.9 g/dL (ref 11.1–15.9)
Immature Grans (Abs): 0 10*3/uL (ref 0.0–0.1)
Immature Granulocytes: 0 %
Lymphocytes Absolute: 1 10*3/uL (ref 0.7–3.1)
Lymphs: 20 %
MCH: 33.3 pg — ABNORMAL HIGH (ref 26.6–33.0)
MCHC: 34.6 g/dL (ref 31.5–35.7)
MCV: 96 fL (ref 79–97)
Monocytes Absolute: 0.7 10*3/uL (ref 0.1–0.9)
Monocytes: 14 %
Neutrophils Absolute: 3.2 10*3/uL (ref 1.4–7.0)
Neutrophils: 63 %
Platelets: 237 10*3/uL (ref 150–450)
RBC: 4.18 x10E6/uL (ref 3.77–5.28)
RDW: 12.8 % (ref 11.7–15.4)
WBC: 5.1 10*3/uL (ref 3.4–10.8)

## 2023-05-27 LAB — PRO B NATRIURETIC PEPTIDE: NT-Pro BNP: 204 pg/mL (ref 0–738)

## 2023-06-02 DIAGNOSIS — M5459 Other low back pain: Secondary | ICD-10-CM | POA: Diagnosis not present

## 2023-06-02 DIAGNOSIS — M5416 Radiculopathy, lumbar region: Secondary | ICD-10-CM | POA: Diagnosis not present

## 2023-06-10 DIAGNOSIS — M5416 Radiculopathy, lumbar region: Secondary | ICD-10-CM | POA: Diagnosis not present

## 2023-06-10 DIAGNOSIS — M539 Dorsopathy, unspecified: Secondary | ICD-10-CM | POA: Diagnosis not present

## 2023-06-10 DIAGNOSIS — M47819 Spondylosis without myelopathy or radiculopathy, site unspecified: Secondary | ICD-10-CM | POA: Diagnosis not present

## 2023-06-10 DIAGNOSIS — M48061 Spinal stenosis, lumbar region without neurogenic claudication: Secondary | ICD-10-CM | POA: Diagnosis not present

## 2023-06-10 DIAGNOSIS — M47816 Spondylosis without myelopathy or radiculopathy, lumbar region: Secondary | ICD-10-CM | POA: Diagnosis not present

## 2023-06-12 ENCOUNTER — Telehealth: Payer: Self-pay | Admitting: Internal Medicine

## 2023-06-12 ENCOUNTER — Ambulatory Visit (INDEPENDENT_AMBULATORY_CARE_PROVIDER_SITE_OTHER): Payer: Medicare Other | Admitting: Internal Medicine

## 2023-06-12 VITALS — BP 160/76 | HR 73

## 2023-06-12 DIAGNOSIS — Z8679 Personal history of other diseases of the circulatory system: Secondary | ICD-10-CM | POA: Diagnosis not present

## 2023-06-12 DIAGNOSIS — R0609 Other forms of dyspnea: Secondary | ICD-10-CM

## 2023-06-12 DIAGNOSIS — R053 Chronic cough: Secondary | ICD-10-CM | POA: Diagnosis not present

## 2023-06-12 DIAGNOSIS — Z8719 Personal history of other diseases of the digestive system: Secondary | ICD-10-CM

## 2023-06-12 LAB — POCT EXHALED NITRIC OXIDE: FeNO level (ppb): 6

## 2023-06-12 NOTE — Addendum Note (Signed)
 Addended by: Kalman Shan on: 06/12/2023 03:35 PM   Modules accepted: Orders

## 2023-06-12 NOTE — Telephone Encounter (Signed)
 Vernel Donlan B Curtisis scheduled 06/18/24 for cardiac CT. I also need HRCT without contrast to better asess her lung but can wait if cardiac is needed.   ThoughtS?  Thanks    SIGNATURE    Dr. Kalman Shan, M.D., F.C.C.P,  Pulmonary and Critical Care Medicine Staff Physician, [facility] Director - Interstitial Lung Disease  Program  Pulmonary Fibrosis [facility] at [facility] [place], [place], [address]   Pager: [phone], If no answer  -> Check AMION or Try [phone] Telephone (clinical office): [phone] Telephone (research): [phone]  1:34 PM 06/12/2023

## 2023-06-12 NOTE — Progress Notes (Addendum)
 Subjective:     Patient ID: Natasha [name], female   DOB: 1933-03-18, 89 y.o.   MRN: [id number]  HPI    PCP Pearla Dubonnet, MD Cards: Dr Armanda Magic EP: Dr Velta Addison at [facility]PI  IOV 05/15/2014  Chief Complaint  Patient presents with   Pulmonary Consult    Pt referred by Dr. Kevan Ny for dyspnea with activity. Pt c/o mild dry cough.     89 year old obese lady with the multiple medical problems and atrial fibrillation. Referred for dyspnea. She's had a long-standing history with persistent atrial fibrillation. In 2014 was evaluated by a Dr. Velta Addison at [facility] and was status post ablation 2 years ago. However a to fibrillation recurred in 2015 and she's been on  Flecainide for several months along with baseline diltiazem and bysytolic. Then subsequently in late 2015 underwent a second ablation and according to her history it was an incomplete procedure and since then she feels that she's had dyspnea on exertion which she notices for climbing flight of stairs and walking more than 500 feet on level ground. It is relieved by rest. She is concerned it may be related to her flecainide. She does have mild chronic cough with mild postnasal drainage but this is old persistent and chronic and unchanged and is unrelated to her dyspnea. There is no associated wheezing or pedal edema. She had a recent spirometry that suggestive restriction with low DLCO and a chest x-ray that suggested interstitial lung disease changes and this is an added concern and she's been referred here   CT chest 08/23/2005: Clear lung fields but with a large hiatal hernia   CT sinus 12/02/2005: No evidence of sinusitis   Spirometry with DLCO: 05/12/2014: FVC 2.35/72%, FEV1 1.8 L/72% with a ratio of 102%. Suggestive of restriction. DLCO of 18.2/58%. Overall spirometry suggests restriction with low diffusion capacity.  Chest x-ray 04/24/2014: Suggests possible ILD but she also has a hiatal hernia  ECHO 03/19/14-  normal. PASP 40s per report  Walking desaturation test 185 feet 3 laps on room air: She got dyspneic at the second lap but was able to continue and finish the third lap. Heart rate remained in the 70s   REC workup   OV 09/05/2014   Chief Complaint  Patient presents with   Follow-up    Pt here to f/u after super D CT chest. Pt stated her breathing has improved. Pt c/o mild dry cough which increases when lying down. Pt denies CP/tightness.    Follow-up dyspnea  - Has completed her workup. All documented below.. After Lasix following right heart catheterization dyspnea is 50% better. But she still has 50% residual dyspnea which is present on exertion relieved by rest. She's not interested in a trial of pulmonary rehabilitation or inhaler therapy. She is known to have hiatal hernia that is moderate in size  Follow-up lung nodule right upper lobe  - This is somewhat decreased between February 2016 andMay 2016   Dyspnea workup    - . Hiatal hernia also seen - this is known to her. Moderate in size   - ONO test - ono 2/2/216 revwed - total time </= 88% st 20sec. Basically normal. No need for o2 at night.     - Right heart cath Procedural Findings: Hemodynamics (mmHg) RA mean 10 RV 41/12 PA 41/18, mean 30 PCWP mean 20 with v-waves to 27  Oxygen saturations: PA 82% AO 99%  Cardiac Output (Fick) 8.65  Cardiac Index (Fick) 4.08  PVR 1.15 WU  Cardiac Output (Thermo) 5.3 Cardiac Index (Thermo) 2.5 PVR 1.89 WU  Final Conclusions:  Preserved cardiac output (though numbers by Fick and thermodilution were somewhat different).  She has mild pulmonary hypertension.  I think that this is primarily pulmonary venous hypertension from elevated left atrial pressures (PCWP 20 with v-waves to 27).  Her echo showed mild MR, so I suspect the prominent v-waves are due to LV diastolic dysfunction.  Right-sided filling pressure is also elevated.   IMPRESSION: CT chest May 2016  1. A right  upper lobe pulmonary nodule measures slightly smaller than on the exam of 05/21/2014. Favored to be due to differences in slice selection. This may have enlarged minimally since 2007, therefore warrants followup attention. 2. Other pulmonary nodules which measure 4 mm or less. If the patient is at high risk for bronchogenic carcinoma, follow-up chest CT at 1 year is recommended. If the patient is at low risk, no follow-up is needed. This recommendation follows the consensus statement: "Guidelines for Management of Small Pulmonary Nodules Detected on CT Scans: A Statement from the Fleischner Society" as published in Radiology 2005; 237:395-400. Available online at: DietDisorder.cz.     Electronically Signed   By: Jeronimo Greaves M.D.   On: 09/02/2014 16:55  OV 06/18/2015 Chief Complaint  Patient presents with   Follow-up    Pt here after CT from 02/2015. Pt states her SOB has improved since last OV. Pt denies cough and CP/tightness.    follow-up lung nodule multiple with the largest in the right upper lobe in this nonsmoker. CT chest #2016 visualized personally it shows the lung nodules are stable since May 2016 which itself was an improvement. Results-below  Follow dyspnea: This was improved after right heart cath that showed pulmonary hypertension associated with elevated wedge pressure and starting her on Lasix. At last visit she was significant better. Since then she has continued Lasix and now dyspnea is almost resolved. She is highly frustrated by her atrial fibrillation which is failed ablation many times. She is on multiple medications. She has a large associated hiatal hernia.    COMPARISON:  09/02/2014   FINDINGS: The lungs are well aerated bilaterally. No focal infiltrate or sizable effusion is seen. Areas of scarring as well as multiple small pulmonary nodules are again identified and stable from the prior exam. The largest of these is  less well visualized on the current study in the right upper lobe best seen on image number 19 of series 3. It is stable in appearance measuring between 6 and 8 mm depending on the image plane. Multiple smaller nodules are again seen and stable. The thoracic inlet is within normal limits.   Scattered small mediastinal lymph nodes are again identified and stable. No significant hilar adenopathy is noted.   Large hiatal hernia is seen. The upper abdomen is otherwise within normal limits. The osseous structures show degenerative change of the thoracic spine.   IMPRESSION: Multiple pulmonary nodules. The largest of which lies in the right upper lobe and is stable from the prior study. Followup examination in 1 year is recommended to assess for stability.   The remainder of the exam is stable from the prior study.     Electronically Signed   By: Alcide Clever M.D.   On: 03/06/2015 15:17   OV 03/23/2016  Chief Complaint  Patient presents with   Follow-up    CT results of right lung. Pt. c/o of fatigue when walking a long distance.  Breathing has been ok.    Fu dyspnea - this has resolved aftrer carfdiac intervention but still has exertional fatigue which she thinks is due to Chronotropic incompetence because of atrial fibrillation treatment. She says she cannot do aerobic exercise because of arthralgia and vaccine. She says she has difficulty losing weight. But admits that weight loss would help  Follow-up right upper lobe 9 mm lung nodule: This is stable from February 2016 through November 2017. She is a nonsmoker.     IMPRESSION: 1. Similar size of a 9 x 8 mm right upper lobe pulmonary nodule back to 05/21/2014. This strongly favors a benign etiology. as 2 years of stability are required to confirm a benign lesion, recommend follow-up chest CT on or after 05/21/2016. 2. Other smaller pulmonary nodules are primarily felt to be similar, but many are better delineated today  secondary to thinner slice collimation. Recommend attention on follow-up. 3. Moderate hiatal hernia.     Electronically Signed   By: Jeronimo Greaves M.D.   On: 03/04/2016 14:15   OV 06/12/2023 -new consult because last visit was in 2017.  Subjective:  Patient ID: Natasha [name], female , DOB: 1932/04/23 , age 46 y.o. , MRN: [id number] , ADDRESS: [address] [place] [address] PCP Thana Ates, MD Patient Care Team: Thana Ates, MD as PCP - General (Internal Medicine) Quintella Reichert, MD as PCP - Cardiology (Cardiology) Marinus Maw, MD as PCP - Electrophysiology (Cardiology) Drema Dallas, DO as Consulting Physician (Neurology)  This Provider for this visit: Treatment Team:  Attending Provider: Kalman Shan, MD    06/12/2023 -   Chief Complaint  Patient presents with   Follow-up     HPI TASHANNA DOLIN 89 y.o. -new consult evaluation because the last time I personally saw her was in 2017.  She is here with her husband.  She is known to have large hiatal hernia.  Previous CT scans including high-resolution has been reported as normal [although there might be some air trapping] she tells me that she is having insidious onset of shortness of breath with exertion relieved by rest.  Going to the bedroom from the bathroom or vice versa is quite difficult.  Here she walked 200 feet and was quite dyspneic although she did not desaturate.  It has been going on for several months and is getting worse.  She says this summer 2024 she had COVID [not hospitalized].  She also had a choking episode a few years ago while eating a hamburger.  After that she had internal bleeding according to history but not admitted.  Another episode of dysphagia in 2024 again internal bleeding all again treated outpatient.  No admissions no ER visits.  Currently not anemic anymore.  She has chronic mild low sodium.     She is also reporting mild to moderate chronic cough with raspy voice -there is no associated  wheezing.  It happens with exertion but also randomly.   Last echocardiogram 2019 with grade 3 diastolic dysfunction.  She is got an upcoming echo  Chart review also stress test coming cardiac CT.  I have written to Dr. Mayford Knife to see if high-resolution CT chest is possible-> got message back from Tanna Savoy, RN.  She said that coronary CT does not visualize the lungs fully.  Therefore I have ordered a high-resolution CT chest.  This will be done at the [facility] time at [facility] when she has a coronary CT.  FENO: 6 ppb 06/12/2023   Simple office walk 224 (66+46 x 2) feet Pod A at [facility] x  3 laps goal with forehead probe 06/12/2023    O2 used ra   Number laps completed 1 of 3 laps with cane   Comments about pace Slow pace   Resting Pulse Ox/HR 100% and 70/min   Final Pulse Ox/HR 97% and 98/min   Desaturated </= 88% no   Desaturated <= 3% points yes   Got Tachycardic >/= 90/min yes   Symptoms at end of test Modertte dyspna   Miscellaneous comments x      PFT     Latest Ref Rng & Units 03/05/2018    1:39 PM 05/12/2014    3:22 PM  PFT Results  FVC-Pre L 2.12  2.35   FVC-Predicted Pre % 70  72   Pre FEV1/FVC % % 78  76   FEV1-Pre L 1.64  1.78   FEV1-Predicted Pre % 73  72   DLCO uncorrected ml/min/mmHg 18.02  18.26   DLCO UNC% % 60  58   DLCO corrected ml/min/mmHg 17.65    DLCO COR %Predicted % 59    DLVA Predicted % 99  80   TLC L 4.55    TLC % Predicted % 80    RV % Predicted % 89      IMPRESSION: 1. Mosaic ground-glass opacity in the lungs bilaterally, a nonspecific finding but can be seen in the setting of small airways disease/air trapping. 2. Large hiatal hernia. 3. Numerous bilateral pulmonary nodules are again identified. Some of these are minimally more conspicuous but not substantially changed in the more than 5 year interval since the prior study consistent with benign etiology. No followup imaging is recommended.     Electronically Signed    By: Kennith Center M.D.   On: 03/09/2023 15:31      LAB RESULTS last 96 hours No results found.       has a past medical history of CHF (congestive heart failure) (HCC), Chronic diastolic heart failure (HCC) (47/82/9562), Complication of anesthesia, COPD (chronic obstructive pulmonary disease) (HCC), Diverticular disease, DJD (degenerative joint disease), Dysphagia, Dysrhythmia, GERD (gastroesophageal reflux disease), Hiatal hernia, Hypertension, Hypothyroidism, Lower leg edema, Neuromuscular disorder (HCC), PAF (paroxysmal atrial fibrillation) (HCC), Pneumonia, PONV (postoperative nausea and vomiting), Restless leg syndrome, and Seasonal allergies.   reports that she has never smoked. She has been exposed to tobacco smoke. She has never used smokeless tobacco.  Past Surgical History:  Procedure Laterality Date   ABDOMINAL HYSTERECTOMY     APPENDECTOMY     ATRIAL FIBRILLATION ABLATION  09/06/2013   2'14 -[facility]   BALLOON DILATION  03/03/2011   Procedure: Marvis Repress DILATION;  Surgeon: Charolett Bumpers, MD;  Location: [facility] ENDOSCOPY;  Service: Endoscopy;  Laterality: N/A;   bladder polyps     BREAST EXCISIONAL BIOPSY Left    BREAST SURGERY     left lumpectomy   CARDIOVERSION     x3   CARDIOVERSION N/A 12/30/2014   Procedure: CARDIOVERSION;  Surgeon: Lewayne Bunting, MD;  Location: [facility] ENDOSCOPY;  Service: Cardiovascular;  Laterality: N/A;   CARDIOVERSION N/A 04/23/2015   Procedure: CARDIOVERSION;  Surgeon: Thurmon Fair, MD;  Location: [facility] ENDOSCOPY;  Service: Cardiovascular;  Laterality: N/A;   CATARACT EXTRACTION Left    COLONOSCOPY WITH PROPOFOL N/A 09/24/2013   Procedure: COLONOSCOPY WITH PROPOFOL;  Surgeon: Charolett Bumpers, MD;  Location: [facility] ENDOSCOPY;  Service: Endoscopy;  Laterality: N/A;  ESOPHAGOGASTRODUODENOSCOPY  03/03/2011   Procedure: ESOPHAGOGASTRODUODENOSCOPY (EGD);  Surgeon: Charolett Bumpers, MD;  Location: [facility];  Service: Endoscopy;  Laterality: N/A;    ESOPHAGOGASTRODUODENOSCOPY (EGD) WITH PROPOFOL Bilateral 03/04/2022   Procedure: ESOPHAGOGASTRODUODENOSCOPY (EGD) WITH PROPOFOL;  Surgeon: Vida Rigger, MD;  Location: [facility] ENDOSCOPY;  Service: Gastroenterology;  Laterality: Bilateral;   EYE SURGERY     cataracat   RHINOPLASTY     RIGHT HEART CATHETERIZATION N/A 07/11/2014   Procedure: RIGHT HEART CATH;  Surgeon: Laurey Morale, MD;  Location: [facility] CATH LAB;  Service: Cardiovascular;  Laterality: N/A;   ROTATOR CUFF REPAIR     THYROIDECTOMY  1973   TONSILLECTOMY  1942   TOTAL KNEE ARTHROPLASTY Right 10/28/2020   Procedure: TOTAL KNEE ARTHROPLASTY;  Surgeon: Eugenia Mcalpine, MD;  Location: [facility] ORS;  Service: Orthopedics;  Laterality: Right;  with adductor canal   TUBAL LIGATION      Allergies  Allergen Reactions   Codeine Nausea And Vomiting and Nausea Only   Penicillins Hives    Has patient had a PCN reaction causing immediate rash, facial/tongue/throat swelling, SOB or lightheadedness with hypotension: No Has patient had a PCN reaction causing severe rash involving mucus membranes or skin necrosis: No Has patient had a PCN reaction that required hospitalization No Has patient had a PCN reaction occurring within the last 10 years: No If all of the above answers are "NO", then may proceed with Cephalosporin use.   Tetracycline Rash   Tramadol Nausea Only   Digoxin Other (See Comments)    Flushed neck and face   Digoxin And Related Other (See Comments)    ' Makes my face blood red"   Erythromycin Other (See Comments) and Hives    Flushing on face   Sulfa Antibiotics Other (See Comments)    PRESERVATIVE OF SOME FOODS- MAKES FLUSHES TO FACE AND NECK   Tetracyclines & Related Other (See Comments)    "Sores on legs"   Diltiazem Hcl Rash    Rash with long acting form   Lyrica [Pregabalin] Rash   Sulfites Rash    PRESERVATIVE OF SOME FOODS- MAKES FLUSHES TO FACE AND NECK PRESERVATIVE OF SOME FOODS- MAKES FLUSHES TO FACE AND NECK     Immunization History  Administered Date(s) Administered   Influenza Split 04/18/2014   Influenza-Unspecified 12/16/2014   Pneumococcal Conjugate-13 04/24/2014    Family History  Problem Relation Age of Onset   Transient ischemic attack Mother    Heart failure Father    CVA Father    Lung cancer Father    Hypertension Sister    Cancer Brother    Sudden death Brother    Anesthesia problems Neg Hx    Hypotension Neg Hx    Malignant hyperthermia Neg Hx    Pseudochol deficiency Neg Hx    Heart attack Neg Hx      Current Outpatient Medications:    acetaminophen (TYLENOL) 500 MG tablet, Take 1,000 mg by mouth every 8 (eight) hours as needed for moderate pain., Disp: , Rfl:    augmented betamethasone dipropionate (DIPROLENE-AF) 0.05 % cream, Apply topically daily as needed., Disp: , Rfl:    butalbital-acetaminophen-caffeine (FIORICET) 50-325-40 MG tablet, Take 1 tablet by mouth 2 (two) times daily as needed for headache., Disp: , Rfl:    cyanocobalamin (VITAMIN B12) 1000 MCG tablet, Take 1,000 mcg by mouth daily., Disp: , Rfl:    ELIQUIS 5 MG TABS tablet, TAKE 1 TABLET BY MOUTH TWO TIMES DAILY., Disp: 60 tablet, Rfl:  5   esomeprazole (NEXIUM) 20 MG capsule, Take 20 mg by mouth daily at 12 noon., Disp: , Rfl:    estradiol (CLIMARA - DOSED IN MG/24 HR) 0.025 mg/24hr patch, Place 0.025 mg onto the skin once a week., Disp: , Rfl:    furosemide (LASIX) 40 MG tablet, TAKE 1 AND 1/2 TABLETS BY MOUTH DAILY. (Patient taking differently: Take 40 mg by mouth as needed.), Disp: 135 tablet, Rfl: 3   LORazepam (ATIVAN) 1 MG tablet, Take 0.5-1 mg by mouth at bedtime as needed for sleep. For sleep, Disp: , Rfl:    MAGNESIUM PO, Take 400 mg by mouth daily., Disp: , Rfl:    mupirocin ointment (BACTROBAN) 2 %, Apply 1 application topically 2 (two) times daily as needed (wound care)., Disp: , Rfl:    OVER THE COUNTER MEDICATION, Apply 1 application topically at bedtime as needed (pain). [employer] pain  relief cream, Disp: , Rfl:    potassium chloride (KLOR-CON) 10 MEQ tablet, TAKE 1 TABLET BY MOUTH DAILY, Disp: 90 tablet, Rfl: 3   pramipexole (MIRAPEX) 1 MG tablet, Take 1 mg by mouth at bedtime., Disp: , Rfl:    Riboflavin 400 MG CAPS, Take 400 mg by mouth daily., Disp: , Rfl:    sennosides-docusate sodium (SENOKOT-S) 8.6-50 MG tablet, Take 1 tablet by mouth daily as needed for constipation., Disp: , Rfl:    SYNTHROID 125 MCG tablet, Take 125 mcg by mouth every morning., Disp: , Rfl:    valsartan (DIOVAN) 80 MG tablet, Take 40 mg by mouth daily as needed (Take 40 mg if blood pressure is over 130)., Disp: , Rfl:    verapamil (CALAN-SR) 120 MG CR tablet, Take 120 mg by mouth daily as needed (Tachycardia)., Disp: , Rfl:    Vitamin D, Cholecalciferol, 50 MCG (2000 UT) CAPS, Take 2,000 Units by mouth daily., Disp: , Rfl:    metoprolol tartrate (LOPRESSOR) 100 MG tablet, Take 1 tablet (100 mg total) by mouth once for 1 dose. Take 90-120 minutes prior to scan., Disp: 1 tablet, Rfl: 0      Objective:   Vitals:   06/12/23 1315  BP: (!) 160/76  Pulse: 73  SpO2: 98%    Estimated body mass index is 29.35 kg/m as calculated from the following:   Height as of 05/26/23: 5\' 8"  (1.727 m).   Weight as of 05/26/23: 193 lb (87.5 kg).  @WEIGHTCHANGE @  There were no vitals filed for this visit.   Physical Exam   General: No distress. Looks well O2 at rest: no Cane present: YES ad WALKER Sitting in wheel chair: no Frail: no Obese: YES Neuro: Alert and Oriented x 3. GCS 15. Speech normal Psych: Pleasant Resp:  Barrel Chest - no.  Wheeze - no, Crackles - no, No overt respiratory distress CVS: Normal heart sounds. Murmurs - no Ext: Stigmata of Connective Tissue Disease - no HEENT: Normal upper airway. PEERL +. No post nasal drip        Assessment:       ICD-10-CM   1. DOE (dyspnea on exertion)  R06.09 Pulse oximetry, overnight    Pulmonary function test    POCT EXHALED NITRIC OXIDE     2. Chronic cough  R05.3 Pulse oximetry, overnight    Pulmonary function test    3. History of diastolic dysfunction  Z86.79 Pulse oximetry, overnight    Pulmonary function test    4. History of hiatal hernia  Z87.19 Pulse oximetry, overnight    Pulmonary function  test         Plan:     Patient Instructions     ICD-10-CM   1. DOE (dyspnea on exertion)  R06.09     2. Chronic cough  R05.3     3. History of diastolic dysfunction  Z86.79     4. History of hiatal hernia  Z87.19      Unclear cause of shortness of breath and cough.  But possibilities include hiatal hernia related cough.  Possible etiology includes stiff heart muscle called diastolic dysfunction causing shortness of breath. FENO 6 ppb suggests against asthma  Plan - Await echocardiogram result - Await cardiac CT results [ideally you need high-resolution CT chest but we can see what the cardiac CT without contrast shows first] -Get full pulmonary function test -Do overnight pulse oximetry on room air -Recommended inhaled steroid empiric but agreed to wait till all the results come back and then reassess  Follow-up - Return back to see Dr. Marchelle Gearing or nurse practitioner in few to several weeks   FOLLOWUP Return in about 6 weeks (around 07/24/2023) for with any of the APPS, with Dr Marchelle Gearing.    SIGNATURE    Dr. Kalman Shan, M.D., F.C.C.P,  Pulmonary and Critical Care Medicine Staff Physician, [facility] Center Director - Interstitial Lung Disease  Program  [facility] at [facility] [place], [place], [address]  Pager: [phone], If no answer or between  15:00h - 7:00h: call [phone] Telephone: [phone]  2:04 PM 06/12/2023

## 2023-06-12 NOTE — Patient Instructions (Addendum)
 ICD-10-CM   1. DOE (dyspnea on exertion)  R06.09     2. Chronic cough  R05.3     3. History of diastolic dysfunction  Z86.79     4. History of hiatal hernia  Z87.19      Unclear cause of shortness of breath and cough.  But possibilities include hiatal hernia related cough.  Possible etiology includes stiff heart muscle called diastolic dysfunction causing shortness of breath. FENO 6 ppb suggests against asthma  Plan - Await echocardiogram result - Await cardiac CT results [ideally you need high-resolution CT chest but we can see what the cardiac CT without contrast shows first] -Get full pulmonary function test -Do overnight pulse oximetry on room air -Recommended inhaled steroid empiric but agreed to wait till all the results come back and then reassess  Follow-up - Return back to see Dr. Marchelle Gearing or nurse practitioner in few to several weeks

## 2023-06-14 ENCOUNTER — Ambulatory Visit (HOSPITAL_COMMUNITY): Payer: Medicare Other | Attending: Cardiology

## 2023-06-14 DIAGNOSIS — R0600 Dyspnea, unspecified: Secondary | ICD-10-CM | POA: Insufficient documentation

## 2023-06-14 LAB — ECHOCARDIOGRAM COMPLETE
Area-P 1/2: 3.61 cm2
MV M vel: 4.46 m/s
MV Peak grad: 79.6 mmHg
S' Lateral: 2.4 cm

## 2023-06-14 NOTE — Telephone Encounter (Signed)
 Thanks.  I have added the high-res CT order

## 2023-06-16 ENCOUNTER — Telehealth (HOSPITAL_COMMUNITY): Payer: Self-pay | Admitting: *Deleted

## 2023-06-16 NOTE — Telephone Encounter (Signed)
 Reaching out to patient to offer assistance regarding upcoming cardiac imaging study; pt verbalizes understanding of appt date/time, parking situation and where to check in, pre-test NPO status and medications ordered, and verified current allergies; name and call back number provided for further questions should they arise  Larey Brick RN Navigator Cardiac Imaging [facility] [phone] office [phone] cell  Patient to take 100mg  metoprolol tartrate two hours prior to her cardiac CT scan. She states she is currently NOT in Afib and will arrive at 2 PM.

## 2023-06-19 ENCOUNTER — Ambulatory Visit (HOSPITAL_COMMUNITY)
Admission: RE | Admit: 2023-06-19 | Discharge: 2023-06-19 | Disposition: A | Discharge status: Home / Self Care | Payer: Medicare Other | Source: Ambulatory Visit | Attending: Cardiology | Admitting: Cardiology

## 2023-06-19 ENCOUNTER — Other Ambulatory Visit: Payer: Self-pay | Admitting: Cardiology

## 2023-06-19 DIAGNOSIS — R0609 Other forms of dyspnea: Secondary | ICD-10-CM | POA: Diagnosis not present

## 2023-06-19 DIAGNOSIS — Z8719 Personal history of other diseases of the digestive system: Secondary | ICD-10-CM | POA: Diagnosis not present

## 2023-06-19 DIAGNOSIS — R0600 Dyspnea, unspecified: Secondary | ICD-10-CM | POA: Insufficient documentation

## 2023-06-19 DIAGNOSIS — I10 Essential (primary) hypertension: Secondary | ICD-10-CM | POA: Diagnosis not present

## 2023-06-19 DIAGNOSIS — R053 Chronic cough: Secondary | ICD-10-CM | POA: Insufficient documentation

## 2023-06-19 DIAGNOSIS — I4819 Other persistent atrial fibrillation: Secondary | ICD-10-CM

## 2023-06-19 DIAGNOSIS — I5032 Chronic diastolic (congestive) heart failure: Secondary | ICD-10-CM | POA: Insufficient documentation

## 2023-06-19 DIAGNOSIS — Z8679 Personal history of other diseases of the circulatory system: Secondary | ICD-10-CM | POA: Diagnosis not present

## 2023-06-19 MED ORDER — NITROGLYCERIN 0.4 MG SL SUBL
SUBLINGUAL_TABLET | SUBLINGUAL | Status: AC
  Filled 2023-06-19: qty 2

## 2023-06-19 MED ORDER — NITROGLYCERIN 0.4 MG SL SUBL
0.8000 mg | SUBLINGUAL_TABLET | Freq: Once | SUBLINGUAL | Status: AC
  Administered 2023-06-19: 0.8 mg via SUBLINGUAL

## 2023-06-19 MED ORDER — IOHEXOL 350 MG/ML SOLN
95.0000 mL | Freq: Once | INTRAVENOUS | Status: AC | PRN
Start: 2023-06-19 — End: 2023-06-19
  Administered 2023-06-19: 95 mL via INTRAVENOUS

## 2023-06-19 NOTE — Telephone Encounter (Signed)
 Prescription refill request for Eliquis received. Indication: AF Last office visit: 05/26/23  Kym Groom MD Scr: 0.83 on 05/26/23  Epic Age: 89 Weight: 87.5kg  Based on above findings Eliquis 5mg  twice daily is the appropriate dose.  Refill approved.

## 2023-06-20 ENCOUNTER — Other Ambulatory Visit: Payer: Self-pay

## 2023-06-20 ENCOUNTER — Encounter: Payer: Self-pay | Admitting: Cardiology

## 2023-06-20 DIAGNOSIS — I700 Atherosclerosis of aorta: Secondary | ICD-10-CM | POA: Insufficient documentation

## 2023-06-20 DIAGNOSIS — I5032 Chronic diastolic (congestive) heart failure: Secondary | ICD-10-CM

## 2023-06-23 DIAGNOSIS — I5032 Chronic diastolic (congestive) heart failure: Secondary | ICD-10-CM | POA: Diagnosis not present

## 2023-06-24 DIAGNOSIS — J449 Chronic obstructive pulmonary disease, unspecified: Secondary | ICD-10-CM | POA: Diagnosis not present

## 2023-06-24 DIAGNOSIS — I5032 Chronic diastolic (congestive) heart failure: Secondary | ICD-10-CM | POA: Diagnosis not present

## 2023-06-24 DIAGNOSIS — I10 Essential (primary) hypertension: Secondary | ICD-10-CM | POA: Diagnosis not present

## 2023-06-24 DIAGNOSIS — I4891 Unspecified atrial fibrillation: Secondary | ICD-10-CM | POA: Diagnosis not present

## 2023-06-24 LAB — PRO B NATRIURETIC PEPTIDE: NT-Pro BNP: 242 pg/mL (ref 0–738)

## 2023-06-26 ENCOUNTER — Telehealth: Payer: Self-pay | Admitting: Internal Medicine

## 2023-06-26 DIAGNOSIS — R0609 Other forms of dyspnea: Secondary | ICD-10-CM

## 2023-06-26 NOTE — Telephone Encounter (Signed)
 Natasha [name]; Natasha [name] Please have provider add to RX comments on how ONO needs to be done.

## 2023-06-26 NOTE — Telephone Encounter (Signed)
 Dr. Marchelle Gearing please advise if this ONO needs to be done on room air or oxygen.

## 2023-06-28 DIAGNOSIS — R252 Cramp and spasm: Secondary | ICD-10-CM | POA: Diagnosis not present

## 2023-06-28 DIAGNOSIS — R058 Other specified cough: Secondary | ICD-10-CM | POA: Diagnosis not present

## 2023-06-28 NOTE — Telephone Encounter (Signed)
 Room air - order redone

## 2023-07-10 ENCOUNTER — Telehealth: Payer: Self-pay

## 2023-07-10 NOTE — Telephone Encounter (Signed)
-----   Message from Armanda Magic sent at 07/09/2023 12:52 PM EDT ----- No on cardiac portion of coronary CTA showed large hiatal hernia.  Please get her in with GI as she already does not have a GI doctor

## 2023-07-10 NOTE — Telephone Encounter (Signed)
 Spoke with pt regarding non cardiac portion of coronary CTA. Pt stated she is aware of the hiatal hernia and has spoken with a GI doctor about it before. Pt stated that due to her age the doctor had decided not to go through with surgery to fix the hernia. Pt asked to have this information forwarded to Dr. Mayford Knife to see if she still thinks she should see someone in GI again. Pt verbalized understanding. All questions, if any, were answered.

## 2023-07-11 ENCOUNTER — Institutional Professional Consult (permissible substitution): Payer: Medicare Other | Admitting: Internal Medicine

## 2023-07-11 NOTE — Telephone Encounter (Signed)
 Spoke with Natasha [name] regarding Dr. Norris Cross suggestion to follow up with her PCP. Natasha [name] verbalized understanding. All questions, if any, were answered.

## 2023-07-14 ENCOUNTER — Ambulatory Visit: Payer: Medicare Other | Admitting: Neurology

## 2023-07-17 DIAGNOSIS — I5032 Chronic diastolic (congestive) heart failure: Secondary | ICD-10-CM | POA: Diagnosis not present

## 2023-07-17 DIAGNOSIS — I10 Essential (primary) hypertension: Secondary | ICD-10-CM | POA: Diagnosis not present

## 2023-07-17 DIAGNOSIS — I4891 Unspecified atrial fibrillation: Secondary | ICD-10-CM | POA: Diagnosis not present

## 2023-07-17 DIAGNOSIS — J449 Chronic obstructive pulmonary disease, unspecified: Secondary | ICD-10-CM | POA: Diagnosis not present

## 2023-07-18 DIAGNOSIS — M4316 Spondylolisthesis, lumbar region: Secondary | ICD-10-CM | POA: Diagnosis not present

## 2023-07-19 DIAGNOSIS — H04123 Dry eye syndrome of bilateral lacrimal glands: Secondary | ICD-10-CM | POA: Diagnosis not present

## 2023-07-19 DIAGNOSIS — Z961 Presence of intraocular lens: Secondary | ICD-10-CM | POA: Diagnosis not present

## 2023-07-20 DIAGNOSIS — M4316 Spondylolisthesis, lumbar region: Secondary | ICD-10-CM | POA: Diagnosis not present

## 2023-07-23 DIAGNOSIS — I10 Essential (primary) hypertension: Secondary | ICD-10-CM | POA: Diagnosis not present

## 2023-07-23 DIAGNOSIS — I4891 Unspecified atrial fibrillation: Secondary | ICD-10-CM | POA: Diagnosis not present

## 2023-07-23 DIAGNOSIS — I5032 Chronic diastolic (congestive) heart failure: Secondary | ICD-10-CM | POA: Diagnosis not present

## 2023-07-23 DIAGNOSIS — J449 Chronic obstructive pulmonary disease, unspecified: Secondary | ICD-10-CM | POA: Diagnosis not present

## 2023-07-25 ENCOUNTER — Other Ambulatory Visit: Payer: Self-pay | Admitting: Internal Medicine

## 2023-08-16 DIAGNOSIS — I10 Essential (primary) hypertension: Secondary | ICD-10-CM | POA: Diagnosis not present

## 2023-08-16 DIAGNOSIS — I4891 Unspecified atrial fibrillation: Secondary | ICD-10-CM | POA: Diagnosis not present

## 2023-08-16 DIAGNOSIS — J449 Chronic obstructive pulmonary disease, unspecified: Secondary | ICD-10-CM | POA: Diagnosis not present

## 2023-08-16 DIAGNOSIS — I5032 Chronic diastolic (congestive) heart failure: Secondary | ICD-10-CM | POA: Diagnosis not present

## 2023-08-22 DIAGNOSIS — J449 Chronic obstructive pulmonary disease, unspecified: Secondary | ICD-10-CM | POA: Diagnosis not present

## 2023-08-22 DIAGNOSIS — I10 Essential (primary) hypertension: Secondary | ICD-10-CM | POA: Diagnosis not present

## 2023-08-22 DIAGNOSIS — I5032 Chronic diastolic (congestive) heart failure: Secondary | ICD-10-CM | POA: Diagnosis not present

## 2023-08-22 DIAGNOSIS — I4891 Unspecified atrial fibrillation: Secondary | ICD-10-CM | POA: Diagnosis not present

## 2023-08-24 DIAGNOSIS — R0902 Hypoxemia: Secondary | ICD-10-CM | POA: Diagnosis not present

## 2023-08-24 DIAGNOSIS — G4730 Sleep apnea, unspecified: Secondary | ICD-10-CM | POA: Diagnosis not present

## 2023-08-25 ENCOUNTER — Other Ambulatory Visit: Payer: Self-pay | Admitting: Internal Medicine

## 2023-08-25 DIAGNOSIS — Z1231 Encounter for screening mammogram for malignant neoplasm of breast: Secondary | ICD-10-CM

## 2023-08-28 NOTE — Patient Instructions (Signed)
 ICD-10-CM   1. DOE (dyspnea on exertion)  R06.09     2. Grade III diastolic dysfunction  I51.89     3. Chronic cough  R05.3     4. History of hiatal hernia  Z87.19     5. Multiple lung nodules on CT  R91.8       # Shortness of breath  - This because of a very stiff heart muscle known as grade 3 diastolic dysfunction along with weight and physical deconditioning  Plan - Please talk to Dr. 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

## 2023-08-28 NOTE — Progress Notes (Unsigned)
 Subjective:     Patient ID: Natasha [name], female   DOB: [birth year], 89 y.o.   MRN: [id number]  HPI    PCP Alston Asper, MD Cards: Dr Gaylyn Keas EP: Dr Axel Bohr at [place] HPI  IOV 05/15/2014  Chief Complaint  Patient presents with   Pulmonary Consult    Pt referred by Dr. Ayesha Lente for dyspnea with activity. Pt c/o mild dry cough.     89 year old obese lady with the multiple medical problems and atrial fibrillation. Referred for dyspnea. She's had a long-standing history with persistent atrial fibrillation. In 2014 was evaluated by a Dr. Axel Bohr at [facility] and was status post ablation 2 years ago. However a to fibrillation recurred in 2015 and she's been on  Flecainide for several months along with baseline diltiazem  and bysytolic. Then subsequently in late 2015 underwent a second ablation and according to her history it was an incomplete procedure and since then she feels that she's had dyspnea on exertion which she notices for climbing flight of stairs and walking more than 500 feet on level ground. It is relieved by rest. She is concerned it may be related to her flecainide. She does have mild chronic cough with mild postnasal drainage but this is old persistent and chronic and unchanged and is unrelated to her dyspnea. There is no associated wheezing or pedal edema. She had a recent spirometry that suggestive restriction with low DLCO and a chest x-ray that suggested interstitial lung disease changes and this is an added concern and she's been referred here   CT chest 08/23/2005: Clear lung fields but with a large hiatal hernia   CT sinus 12/02/2005: No evidence of sinusitis   Spirometry with DLCO: 05/12/2014: FVC 2.35/72%, FEV1 1.8 L/72% with a ratio of 102%. Suggestive of restriction. DLCO of 18.2/58%. Overall spirometry suggests restriction with low diffusion capacity.  Chest x-ray 04/24/2014: Suggests possible ILD but she also has a hiatal hernia  ECHO  03/19/14- normal. PASP 40s per report  Walking desaturation test 185 feet 3 laps on room air: She got dyspneic at the second lap but was able to continue and finish the third lap. Heart rate remained in the 70s   REC workup   OV 09/05/2014   Chief Complaint  Patient presents with   Follow-up    Pt here to f/u after super D CT chest. Pt stated her breathing has improved. Pt c/o mild dry cough which increases when lying down. Pt denies CP/tightness.    Follow-up dyspnea  - Has completed her workup. All documented below.. After Lasix  following right heart catheterization dyspnea is 50% better. But she still has 50% residual dyspnea which is present on exertion relieved by rest. She's not interested in a trial of pulmonary rehabilitation or inhaler therapy. She is known to have hiatal hernia that is moderate in size  Follow-up lung nodule right upper lobe  - This is somewhat decreased between February 2016 andMay 2016   Dyspnea workup    - . Hiatal hernia also seen - this is known to her. Moderate in size   - ONO test - ono 2/2/216 revwed - total time </= 88% st 20sec. Basically normal. No need for o2 at night.     - Right heart cath Procedural Findings: Hemodynamics (mmHg) RA mean 10 RV 41/12 PA 41/18, mean 30 PCWP mean 20 with v-waves to 27  Oxygen  saturations: PA 82% AO 99%  Cardiac Output (Fick) 8.65  Cardiac Index (Fick) 4.08 PVR 1.15 WU  Cardiac Output (Thermo) 5.3 Cardiac Index (Thermo) 2.5 PVR 1.89 WU  Final Conclusions:  Preserved cardiac output (though numbers by Fick and thermodilution were somewhat different).  She has mild pulmonary hypertension.  I think that this is primarily pulmonary venous hypertension from elevated left atrial pressures (PCWP 20 with v-waves to 27).  Her echo showed mild MR, so I suspect the prominent v-waves are due to LV diastolic dysfunction.  Right-sided filling pressure is also elevated.   IMPRESSION: CT chest May 2016  1. A  right upper lobe pulmonary nodule measures slightly smaller than on the exam of 05/21/2014. Favored to be due to differences in slice selection. This may have enlarged minimally since 2007, therefore warrants followup attention. 2. Other pulmonary nodules which measure 4 mm or less. If the patient is at high risk for bronchogenic carcinoma, follow-up chest CT at 1 year is recommended. If the patient is at low risk, no follow-up is needed. This recommendation follows the consensus statement: "Guidelines for Management of Small Pulmonary Nodules Detected on CT Scans: A Statement from the Fleischner Society" as published in Radiology 2005; 237:395-400. Available online at: DietDisorder.cz.     Electronically Signed   By: Lore Rode M.D.   On: 09/02/2014 16:55  OV 06/18/2015 Chief Complaint  Patient presents with   Follow-up    Pt here after CT from 02/2015. Pt states her SOB has improved since last OV. Pt denies cough and CP/tightness.    follow-up lung nodule multiple with the largest in the right upper lobe in this nonsmoker. CT chest #2016 visualized personally it shows the lung nodules are stable since May 2016 which itself was an improvement. Results-below  Follow dyspnea: This was improved after right heart cath that showed pulmonary hypertension associated with elevated wedge pressure and starting her on Lasix . At last visit she was significant better. Since then she has continued Lasix  and now dyspnea is almost resolved. She is highly frustrated by her atrial fibrillation which is failed ablation many times. She is on multiple medications. She has a large associated hiatal hernia.    COMPARISON:  09/02/2014   FINDINGS: The lungs are well aerated bilaterally. No focal infiltrate or sizable effusion is seen. Areas of scarring as well as multiple small pulmonary nodules are again identified and stable from the prior exam. The largest of  these is less well visualized on the current study in the right upper lobe best seen on image number 19 of series 3. It is stable in appearance measuring between 6 and 8 mm depending on the image plane. Multiple smaller nodules are again seen and stable. The thoracic inlet is within normal limits.   Scattered small mediastinal lymph nodes are again identified and stable. No significant hilar adenopathy is noted.   Large hiatal hernia is seen. The upper abdomen is otherwise within normal limits. The osseous structures show degenerative change of the thoracic spine.   IMPRESSION: Multiple pulmonary nodules. The largest of which lies in the right upper lobe and is stable from the prior study. Followup examination in 1 year is recommended to assess for stability.   The remainder of the exam is stable from the prior study.     Electronically Signed   By: Violeta Grey M.D.   On: 03/06/2015 15:17   OV 03/23/2016  Chief Complaint  Patient presents with   Follow-up    CT results of right lung. Pt. c/o of fatigue when  walking a long distance. Breathing has been ok.    Fu dyspnea - this has resolved aftrer carfdiac intervention but still has exertional fatigue which she thinks is due to Chronotropic incompetence because of atrial fibrillation treatment. She says she cannot do aerobic exercise because of arthralgia and vaccine. She says she has difficulty losing weight. But admits that weight loss would help  Follow-up right upper lobe 9 mm lung nodule: This is stable from February 2016 through November 2017. She is a nonsmoker.     IMPRESSION: 1. Similar size of a 9 x 8 mm right upper lobe pulmonary nodule back to 05/21/2014. This strongly favors a benign etiology. as 2 years of stability are required to confirm a benign lesion, recommend follow-up chest CT on or after 05/21/2016. 2. Other smaller pulmonary nodules are primarily felt to be similar, but many are better delineated  today secondary to thinner slice collimation. Recommend attention on follow-up. 3. Moderate hiatal hernia.     Electronically Signed   By: Lore Rode M.D.   On: 03/04/2016 14:15   OV 06/12/2023 -new consult because last visit was in 2017.  Subjective:  Patient ID: Natasha [name], female , DOB: November 18, 1932 , age 74 y.o. , MRN: [id number] , ADDRESS: [address] [place] [id number] PCP Tena Feeling, MD Patient Care Team: Tena Feeling, MD as PCP - General (Internal Medicine) Jacqueline Matsu, MD as PCP - Cardiology (Cardiology) Tammie Fall, MD as PCP - Electrophysiology (Cardiology) Merriam Abbey, DO as Consulting Physician (Neurology)  This Provider for this visit: Treatment Team:  Attending Provider: Maire Scot, MD    06/12/2023 -   Chief Complaint  Patient presents with   Follow-up     HPI ALEASA DRAUGHN 89 y.o. -new consult evaluation because the last time I personally saw her was in 2017.  She is here with her husband.  She is known to have large hiatal hernia.  Previous CT scans including high-resolution has been reported as normal [although there might be some air trapping] she tells me that she is having insidious onset of shortness of breath with exertion relieved by rest.  Going to the bedroom from the bathroom or vice versa is quite difficult.  Here she walked 200 feet and was quite dyspneic although she did not desaturate.  It has been going on for several months and is getting worse.  She says this summer 2024 she had COVID [not hospitalized].  She also had a choking episode a few years ago while eating a hamburger.  After that she had internal bleeding according to history but not admitted.  Another episode of dysphagia in 2024 again internal bleeding all again treated outpatient.  No admissions no ER visits.  Currently not anemic anymore.  She has chronic mild low sodium.     She is also reporting mild to moderate chronic cough with raspy voice -there is no  associated wheezing.  It happens with exertion but also randomly.   Last echocardiogram 2019 with grade 3 diastolic dysfunction.  She is got an upcoming echo  Chart review also stress test coming cardiac CT.  I have written to Dr. Micael Adas to see if high-resolution CT chest is possible-> got message back from Victorino Grates, RN.  She said that coronary CT does not visualize the lungs fully.  Therefore I have ordered a high-resolution CT chest.  This will be done at the [facility] time at [facility] when she has  a coronary CT.  FENO: 6 ppb 06/12/2023   OV 08/29/2023  Subjective:  Patient ID: Natasha [name], female , DOB: February 10, 1933 , age 5 y.o. , MRN: [id number] , ADDRESS: [address] [place] [id number] PCP Tena Feeling, MD Patient Care Team: Tena Feeling, MD as PCP - General (Internal Medicine) Jacqueline Matsu, MD as PCP - Cardiology (Cardiology) Tammie Fall, MD as PCP - Electrophysiology (Cardiology) Merriam Abbey, DO as Consulting Physician (Neurology)  This Provider for this visit: Treatment Team:  Attending Provider: Maire Scot, MD    08/29/2023 -   Chief Complaint  Patient presents with   Follow-up    Coughing some less since the last visit. Sometimes produces some yellow sputum.      HPI BRITTONY KOELKER 89 y.o. -presents for follow-up with husband.  Husband is not an independent historian.  Since her last visit Interim Health status: No new complaints No new medical problems. No new surgeries. No ER visits. No Urgent care visits. No changes to medications.  Shortness of breath with exertion continues.  Echocardiogram in February 2025 shows grade 3 diastolic dysfunction.  Overnight pulse oximetry done but we do not have the results.  Chronic cough continues.  High-resolution CT chest shows multiple lung nodules a lot of motion artifact.  Radiologist concerned about DIONECH.  I recommended inhaled steroids which she is worried about atrial fibrillation which happened to  her many years ago after taking inhaler.  I reassured her that the treatment here is monotherapy with inhaled steroid and no bronchodilator.  She was then aligned.  Also recommended we will get systemic RAST allergy testing.  She says many years ago she had skin testing and she was positive for mold.  She is a line to get this blood work.  Cough continues and there is no other issues.  Personally visualized the CT chest in addition to those nodules and motion artifact there is also significant hiatal hernia.  She understands that she is not a candidate for surgery because of her age.    Dr Mitchel An Reflux Symptom Index (> 13-15 suggestive of LPR cough) 0 -> 5  =  none ->severe problem 08/29/2023   Hoarseness of problem with voice 2  Clearing  Of Throat   Excess throat mucus or feeling of post nasal drip 3  Difficulty swallowing food, liquid or tablets 0  Cough after eating or lying down 1  Breathing difficulties or choking episodes 1  Troublesome or annoying cough 3  Sensation of something sticking in throat or lump in throat 1  Heartburn, chest pain, indigestion, or stomach acid coming up 2  TOTAL Will start inhaled steroid - 13       Simple office walk 224 (66+46 x 2) feet Pod A at [facility] x  3 laps goal with forehead probe 06/12/2023    O2 used ra   Number laps completed 1 of 3 laps with cane   Comments about pace Slow pace   Resting Pulse Ox/HR 100% and 70/min   Final Pulse Ox/HR 97% and 98/min   Desaturated </= 88% no   Desaturated <= 3% points yes   Got Tachycardic >/= 90/min yes   Symptoms at end of test Modertte dyspna   Miscellaneous comments x         PFT     Latest Ref Rng & Units 03/05/2018    1:39 PM 05/12/2014    3:22 PM  PFT Results  FVC-Pre L 2.12  2.35   FVC-Predicted Pre % 70  72   Pre FEV1/FVC % % 78  76   FEV1-Pre L 1.64  1.78   FEV1-Predicted Pre % 73  72   DLCO uncorrected ml/min/mmHg 18.02  18.26   DLCO UNC% % 60  58   DLCO corrected  ml/min/mmHg 17.65    DLCO COR %Predicted % 59    DLVA Predicted % 99  80   TLC L 4.55    TLC % Predicted % 80    RV % Predicted % 89      CLINICAL DATA:  Dyspnea on exertion.   EXAM: CT CHEST WITHOUT CONTRAST   TECHNIQUE: Multidetector CT imaging of the chest was performed following the standard protocol without intravenous contrast. High resolution imaging of the lungs, as well as inspiratory and expiratory imaging, was performed.   RADIATION DOSE REDUCTION: This exam was performed according to the departmental dose-optimization program which includes automated exposure control, adjustment of the mA and/or kV according to patient size and/or use of iterative reconstruction technique.   COMPARISON:  03/09/2023 chest CT. 05/21/2014 high-resolution chest CT.   FINDINGS: Substantially motion degraded scan, limiting assessment.   Cardiovascular: Normal heart size. No significant pericardial effusion/thickening. Atherosclerotic nonaneurysmal thoracic aorta. Normal caliber pulmonary arteries.   Mediastinum/Nodes: No significant thyroid  nodules. Unremarkable esophagus. No axillary adenopathy. No pathologically enlarged mediastinal or discrete hilar nodes on these noncontrast images.   Lungs/Pleura: No pneumothorax. No pleural effusion. No acute consolidative airspace disease or lung masses. Numerous (at least 10) solid pulmonary nodules scattered throughout both lungs, largest 1.0 cm in the right upper lobe on series 10/image 40, all stable. No appreciable new significant pulmonary nodules. Extensive patchy air trapping throughout both lungs on the expiration sequence, unchanged, without evidence of tracheobronchomalacia. No significant regions of subpleural reticulation, ground-glass opacity, traction bronchiectasis, architectural distortion or frank honeycombing.   Upper abdomen: Chronic large hiatal hernia. Stomach is nondistended.   Musculoskeletal: No aggressive  appearing focal osseous lesions. Mild-to-moderate lower thoracic spondylosis.   IMPRESSION: 1. Substantially motion degraded scan, limiting assessment. 2. Extensive patchy air trapping throughout both lungs, unchanged, indicative of small airways disease. Numerous (at least 10) solid pulmonary nodules scattered throughout both lungs, largest 1.0 cm in the right upper lobe, all stable. 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 3. Chronic large hiatal hernia. 4.  Aortic Atherosclerosis (ICD10-I70.0).     Electronically Signed   By: Levell Reach M.D.   On: 07/16/2023 23:29      Latest Reference Range & Units 01/10/17 14:58 05/26/23 15:55  EOS (ABSOLUTE) 0.0 - 0.4 x10E3/uL 0.1 0.1    LAB RESULTS last 96 hours No results found.   ECHO FEB 2025  IMPRESSIONS     1. Left ventricular ejection fraction, by estimation, is 60 to 65%. The  left ventricle has normal function. The left ventricle has no regional  wall motion abnormalities. Left ventricular diastolic parameters are  consistent with Grade III diastolic  dysfunction (restrictive). Elevated left atrial pressure.   2. Right ventricular systolic function is normal. The right ventricular  size is normal.   3. The mitral valve is degenerative. Mild to moderate mitral valve  regurgitation. No evidence of mitral stenosis.   4. The aortic valve is tricuspid. Aortic valve regurgitation is trivial.  Aortic valve sclerosis is present, with no evidence of aortic valve  stenosis.   Comparison(s): A prior study was performed  on 01/15/2018. No significant  change from prior study.     has a past medical history of Aortic atherosclerosis (HCC), CHF (congestive heart failure) (HCC), Chronic diastolic heart failure (HCC) (16/01/9603), Complication of anesthesia, COPD (chronic obstructive pulmonary disease) (HCC), Diverticular disease, DJD (degenerative joint  disease), Dysphagia, Dysrhythmia, GERD (gastroesophageal reflux disease), Hiatal hernia, Hypertension, Hypothyroidism, Lower leg edema, Neuromuscular disorder (HCC), PAF (paroxysmal atrial fibrillation) (HCC), Pneumonia, PONV (postoperative nausea and vomiting), Restless leg syndrome, and Seasonal allergies.   reports that she has never smoked. She has been exposed to tobacco smoke. She has never used smokeless tobacco.  Past Surgical History:  Procedure Laterality Date   ABDOMINAL HYSTERECTOMY     APPENDECTOMY     ATRIAL FIBRILLATION ABLATION  09/06/2013   2'14 -[facility]   BALLOON DILATION  03/03/2011   Procedure: Debborah Fairly DILATION;  Surgeon: Garrett Kallman, MD;  Location: [facility] ENDOSCOPY;  Service: Endoscopy;  Laterality: N/A;   bladder polyps     BREAST EXCISIONAL BIOPSY Left    BREAST SURGERY     left lumpectomy   CARDIOVERSION     x3   CARDIOVERSION N/A 12/30/2014   Procedure: CARDIOVERSION;  Surgeon: Lenise Quince, MD;  Location: [facility] ENDOSCOPY;  Service: Cardiovascular;  Laterality: N/A;   CARDIOVERSION N/A 04/23/2015   Procedure: CARDIOVERSION;  Surgeon: Luana Rumple, MD;  Location: [facility] ENDOSCOPY;  Service: Cardiovascular;  Laterality: N/A;   CATARACT EXTRACTION Left    COLONOSCOPY WITH PROPOFOL  N/A 09/24/2013   Procedure: COLONOSCOPY WITH PROPOFOL ;  Surgeon: Garrett Kallman, MD;  Location: [facility] ENDOSCOPY;  Service: Endoscopy;  Laterality: N/A;   ESOPHAGOGASTRODUODENOSCOPY  03/03/2011   Procedure: ESOPHAGOGASTRODUODENOSCOPY (EGD);  Surgeon: Garrett Kallman, MD;  Location: [facility];  Service: Endoscopy;  Laterality: N/A;   ESOPHAGOGASTRODUODENOSCOPY (EGD) WITH PROPOFOL  Bilateral 03/04/2022   Procedure: ESOPHAGOGASTRODUODENOSCOPY (EGD) WITH PROPOFOL ;  Surgeon: Ozell Blunt, MD;  Location: [facility] ENDOSCOPY;  Service: Gastroenterology;  Laterality: Bilateral;   EYE SURGERY     cataracat   RHINOPLASTY     RIGHT HEART CATHETERIZATION N/A 07/11/2014   Procedure: RIGHT HEART  CATH;  Surgeon: Darlis Eisenmenger, MD;  Location: [facility] CATH LAB;  Service: Cardiovascular;  Laterality: N/A;   ROTATOR CUFF REPAIR     THYROIDECTOMY  1973   TONSILLECTOMY  1942   TOTAL KNEE ARTHROPLASTY Right 10/28/2020   Procedure: TOTAL KNEE ARTHROPLASTY;  Surgeon: Genevie Kerns, MD;  Location: [facility] ORS;  Service: Orthopedics;  Laterality: Right;  with adductor canal   TUBAL LIGATION      Allergies  Allergen Reactions   Codeine Nausea And Vomiting and Nausea Only   Penicillins Hives    Has patient had a PCN reaction causing immediate rash, facial/tongue/throat swelling, SOB or lightheadedness with hypotension: No Has patient had a PCN reaction causing severe rash involving mucus membranes or skin necrosis: No Has patient had a PCN reaction that required hospitalization No Has patient had a PCN reaction occurring within the last 10 years: No If all of the above answers are "NO", then may proceed with Cephalosporin use.   Tetracycline Rash   Tramadol Nausea Only   Digoxin  Other (See Comments)    Flushed neck and face   Digoxin  And Related Other (See Comments)    ' Makes my face blood red"   Erythromycin Other (See Comments) and Hives    Flushing on face   Sulfa Antibiotics Other (See Comments)    PRESERVATIVE OF SOME FOODS- MAKES FLUSHES TO FACE AND NECK  Tetracyclines & Related Other (See Comments)    "Sores on legs"   Diltiazem  Hcl Rash    Rash with long acting form   Lyrica  [Pregabalin ] Rash   Sulfites Rash    PRESERVATIVE OF SOME FOODS- MAKES FLUSHES TO FACE AND NECK PRESERVATIVE OF SOME FOODS- MAKES FLUSHES TO FACE AND NECK    Immunization History  Administered Date(s) Administered   Fluad Trivalent(High Dose 65+) 03/04/2023   Influenza Split 04/18/2014   Influenza-Unspecified 12/16/2014   Pneumococcal Conjugate-13 04/24/2014    Family History  Problem Relation Age of Onset   Transient ischemic attack Mother    Heart failure Father    CVA Father    Lung cancer  Father    Hypertension Sister    Cancer Brother    Sudden death Brother    Anesthesia problems Neg Hx    Hypotension Neg Hx    Malignant hyperthermia Neg Hx    Pseudochol deficiency Neg Hx    Heart attack Neg Hx      Current Outpatient Medications:    acetaminophen  (TYLENOL ) 500 MG tablet, Take 1,000 mg by mouth every 8 (eight) hours as needed for moderate pain., Disp: , Rfl:    apixaban  (ELIQUIS ) 5 MG TABS tablet, TAKE 1 TABLET BY MOUTH TWO TIMES DAILY., Disp: 60 tablet, Rfl: 5   augmented betamethasone dipropionate (DIPROLENE-AF) 0.05 % cream, Apply topically daily as needed., Disp: , Rfl:    butalbital-acetaminophen -caffeine (FIORICET) 50-325-40 MG tablet, Take 1 tablet by mouth 2 (two) times daily as needed for headache., Disp: , Rfl:    cyanocobalamin (VITAMIN B12) 1000 MCG tablet, Take 1,000 mcg by mouth daily., Disp: , Rfl:    esomeprazole (NEXIUM) 20 MG capsule, Take 20 mg by mouth daily at 12 noon., Disp: , Rfl:    estradiol  (CLIMARA  - DOSED IN MG/24 HR) 0.025 mg/24hr patch, Place 0.025 mg onto the skin once a week., Disp: , Rfl:    furosemide  (LASIX ) 40 MG tablet, TAKE 1 AND 1/2 TABLETS BY MOUTH DAILY., Disp: 135 tablet, Rfl: 2   LORazepam  (ATIVAN ) 1 MG tablet, Take 0.5-1 mg by mouth at bedtime as needed for sleep. For sleep, Disp: , Rfl:    MAGNESIUM  PO, Take 400 mg by mouth daily., Disp: , Rfl:    mupirocin ointment (BACTROBAN) 2 %, Apply 1 application topically 2 (two) times daily as needed (wound care)., Disp: , Rfl:    OVER THE COUNTER MEDICATION, Apply 1 application topically at bedtime as needed (pain). [employer] pain relief cream, Disp: , Rfl:    potassium chloride  (KLOR-CON ) 10 MEQ tablet, TAKE 1 TABLET BY MOUTH DAILY, Disp: 90 tablet, Rfl: 3   pramipexole  (MIRAPEX ) 1 MG tablet, Take 1 mg by mouth at bedtime., Disp: , Rfl:    Riboflavin 400 MG CAPS, Take 400 mg by mouth daily., Disp: , Rfl:    sennosides-docusate sodium  (SENOKOT-S) 8.6-50 MG tablet, Take 1 tablet by  mouth daily as needed for constipation., Disp: , Rfl:    SYNTHROID  125 MCG tablet, Take 125 mcg by mouth every morning., Disp: , Rfl:    valsartan (DIOVAN) 80 MG tablet, Take 40 mg by mouth daily as needed (Take 40 mg if blood pressure is over 130)., Disp: , Rfl:    verapamil  (CALAN -SR) 120 MG CR tablet, Take 120 mg by mouth daily as needed (Tachycardia)., Disp: , Rfl:    Vitamin D, Cholecalciferol, 50 MCG (2000 UT) CAPS, Take 2,000 Units by mouth daily., Disp: , Rfl:  Objective:   Vitals:   08/29/23 1417  BP: 130/68  Pulse: 78  SpO2: 98%  Weight: 196 lb (88.9 kg)  Height: 5\' 8"  (1.727 m)    Estimated body mass index is 29.8 kg/m as calculated from the following:   Height as of this encounter: 5\' 8"  (1.727 m).   Weight as of this encounter: 196 lb (88.9 kg).  @WEIGHTCHANGE @  [employer]   08/29/23 1417  Weight: 196 lb (88.9 kg)     Physical Exam   General: No distress. obese O2 at rest: no Cane present: YES Sitting in wheel chair: no Frail: no Obese: no Neuro: Alert and Oriented x 3. GCS 15. Speech normal Psych: Pleasant Resp:  Barrel Chest - no.  Wheeze - no, Crackles - no, No overt respiratory distress CVS: Normal heart sounds. Murmurs - no Ext: Stigmata of Connective Tissue Disease - n HEENT: Normal upper airway. PEERL +. No post nasal drip        Assessment:       ICD-10-CM   1. DOE (dyspnea on exertion)  R06.09 Perennial allergen profile IgE    2. Grade III diastolic dysfunction  I51.89 Perennial allergen profile IgE    3. Chronic cough  R05.3 Perennial allergen profile IgE    4. History of hiatal hernia  Z87.19 Perennial allergen profile IgE    5. Multiple lung nodules on CT  R91.8 Perennial allergen profile IgE         Plan:     Patient Instructions     ICD-10-CM   1. DOE (dyspnea on exertion)  R06.09     2. Grade III diastolic dysfunction  I51.89     3. Chronic cough  R05.3     4. History of hiatal hernia  Z87.19     5.  Multiple lung nodules on CT  R91.8       # Shortness of breath  - This because of a very stiff heart muscle known as grade 3 diastolic dysfunction along with weight and physical deconditioning  Plan - Please talk to Dr. 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    SIGNATURE    Dr. Maire Scot, M.D., F.C.C.P,  Pulmonary and Critical Care Medicine Staff Physician, [facility] Center Director - Interstitial Lung Disease  Program  Pulmonary Fibrosis [facility] at [facility] [place], [place], [address]  Pager: [phone], If no answer or between  15:00h - 7:00h: call [phone] Telephone: [phone]  2:51 PM 08/29/2023   Moderate Complexity MDM OFFICE  2021 E/M guidelines, first released in 2021, with minor revisions added in 2023 and 2024 Must meet the requirements for 2 out  of 3 dimensions to qualify.    Number and complexity of problems addressed Amount and/or complexity of data reviewed Risk of complications and/or morbidity  One or more chronic illness with mild exacerbation, OR progression, OR  side effects of treatment  Two or more stable chronic illnesses  One undiagnosed new problem with uncertain  prognosis  One acute illness with systemic symptoms   One Acute complicated injury Must meet the requirements for 1 of 3 of the categories)  Category 1: Tests and documents, historian  Any combination of 3 of the following:  Assessment requiring an independent historian  Review of prior external note(s) from each unique source  Review of results of each unique test  Ordering of each unique test    Category 2: Interpretation of tests   Independent interpretation of a test performed by another physician/other qualified health care professional (not separately reported)  Category 3: Discuss management/tests  Discussion of management or test interpretation with external physician/other qualified health care professional/appropriate source (not separately reported) Moderate risk of morbidity from additional diagnostic testing or treatment Examples only:  Prescription drug management  Decision regarding minor surgery with identfied patient or procedure risk factors  Decision regarding elective major surgery without identified patient or procedure risk factors  Diagnosis or treatment significantly limited by social determinants of health             HIGh Complexity  OFFICE   2021 E/M guidelines, first released in 2021, with minor revisions added in 2023. Must meet the requirements for 2 out of 3 dimensions to qualify.    Number and complexity of problems addressed Amount and/or complexity of data reviewed Risk of complications and/or morbidity  Severe exacerbation of chronic illness  Acute or chronic illnesses that may pose a threat to life or bodily function, e.g., multiple trauma, acute MI, pulmonary embolus, severe respiratory distress, progressive rheumatoid arthritis, psychiatric illness with potential threat to self or others, peritonitis, acute renal failure, abrupt change in neurological status Must meet the requirements for 2 of 3 of the  categories)  Category 1: Tests and documents, historian  Any combination of 3 of the following:  Assessment requiring an independent historian  Review of prior external note(s) from each unique source  Review of results of each unique test  Ordering of each unique test    Category 2: Interpretation of tests    Independent interpretation of a test performed by another physician/other qualified health care professional (not separately reported)  Category 3: Discuss management/tests  Discussion of management or test interpretation with external physician/other qualified health care professional/appropriate source (not separately reported)  HIGH risk of morbidity from additional diagnostic testing or treatment Examples only:  Drug therapy requiring intensive monitoring for toxicity  Decision for elective major surgery with identified pateint or procedure risk factors  Decision regarding hospitalization or escalation of level of care  Decision for DNR or to de-escalate care   Parenteral controlled  substances            LEGEND - Independent interpretation involves the interpretation of a test for which there is a CPT code, and an interpretation or report is customary. When a review and interpretation of a test is performed and documented by the provider, but not separately reported (billed), then this would represent an independent interpretation. This report does not need to conform to the usual standards of a complete report of the test. This does not include interpretation of tests that do not have formal  reports such as a complete blood count with differential and blood cultures. Examples would include reviewing a chest radiograph and documenting in the medical record an interpretation, but not separately reporting (billing) the interpretation of the chest radiograph.   An appropriate source includes professionals who are not health care professionals but may be  involved in the management of the patient, such as a Clinical research associate, upper officer, case manager or teacher, and does not include discussion with family or informal caregivers.    - SDOH: SDOH are the conditions in the environments where people are born, live, learn, work, play, worship, and age that affect a wide range of health, functioning, and quality-of-life outcomes and risks. (e.g., housing, food insecurity, transportation, etc.). SDOH-related Z codes ranging from Z55-Z65 are the ICD-10-CM diagnosis codes used to document SDOH data Z55 - Problems related to education and literacy Z56 - Problems related to employment and unemployment Z57 - Occupational exposure to risk factors Z58 - Problems related to physical environment Z59 - Problems related to housing and economic circumstances [phone] - Problems related to social environment [phone] - Problems related to upbringing [phone] - Other problems related to primary support group, including family circumstances Z20 - Problems related to certain psychosocial circumstances Z65 - Problems related to other psychosocial circumstances

## 2023-08-29 ENCOUNTER — Encounter: Payer: Self-pay | Admitting: Internal Medicine

## 2023-08-29 ENCOUNTER — Ambulatory Visit (INDEPENDENT_AMBULATORY_CARE_PROVIDER_SITE_OTHER): Payer: Medicare Other | Admitting: Internal Medicine

## 2023-08-29 VITALS — BP 130/68 | HR 78 | Ht 68.0 in | Wt 196.0 lb

## 2023-08-29 DIAGNOSIS — Z8719 Personal history of other diseases of the digestive system: Secondary | ICD-10-CM | POA: Diagnosis not present

## 2023-08-29 DIAGNOSIS — R053 Chronic cough: Secondary | ICD-10-CM

## 2023-08-29 DIAGNOSIS — J45991 Cough variant asthma: Secondary | ICD-10-CM

## 2023-08-29 DIAGNOSIS — R918 Other nonspecific abnormal finding of lung field: Secondary | ICD-10-CM

## 2023-08-29 DIAGNOSIS — Z8679 Personal history of other diseases of the circulatory system: Secondary | ICD-10-CM

## 2023-08-29 DIAGNOSIS — R0609 Other forms of dyspnea: Secondary | ICD-10-CM

## 2023-08-29 DIAGNOSIS — I5189 Other ill-defined heart diseases: Secondary | ICD-10-CM

## 2023-08-29 MED ORDER — ARNUITY ELLIPTA 50 MCG/ACT IN AEPB: 1.0000 | INHALATION_SPRAY | Freq: Every day | RESPIRATORY_TRACT | 3 refills | Status: DC

## 2023-08-30 DIAGNOSIS — E039 Hypothyroidism, unspecified: Secondary | ICD-10-CM | POA: Diagnosis not present

## 2023-08-30 DIAGNOSIS — R0602 Shortness of breath: Secondary | ICD-10-CM | POA: Diagnosis not present

## 2023-08-30 DIAGNOSIS — Z79899 Other long term (current) drug therapy: Secondary | ICD-10-CM | POA: Diagnosis not present

## 2023-08-30 DIAGNOSIS — R2689 Other abnormalities of gait and mobility: Secondary | ICD-10-CM | POA: Diagnosis not present

## 2023-08-30 DIAGNOSIS — I10 Essential (primary) hypertension: Secondary | ICD-10-CM | POA: Diagnosis not present

## 2023-08-30 DIAGNOSIS — K5909 Other constipation: Secondary | ICD-10-CM | POA: Diagnosis not present

## 2023-08-30 DIAGNOSIS — G2581 Restless legs syndrome: Secondary | ICD-10-CM | POA: Diagnosis not present

## 2023-08-30 DIAGNOSIS — E559 Vitamin D deficiency, unspecified: Secondary | ICD-10-CM | POA: Diagnosis not present

## 2023-08-30 DIAGNOSIS — Z7901 Long term (current) use of anticoagulants: Secondary | ICD-10-CM | POA: Diagnosis not present

## 2023-08-30 DIAGNOSIS — I5032 Chronic diastolic (congestive) heart failure: Secondary | ICD-10-CM | POA: Diagnosis not present

## 2023-08-30 DIAGNOSIS — I4891 Unspecified atrial fibrillation: Secondary | ICD-10-CM | POA: Diagnosis not present

## 2023-08-30 DIAGNOSIS — Z0000 Encounter for general adult medical examination without abnormal findings: Secondary | ICD-10-CM | POA: Diagnosis not present

## 2023-08-31 LAB — ALLERGEN PROFILE, PERENNIAL ALLERGEN IGE

## 2023-09-04 ENCOUNTER — Telehealth: Payer: Self-pay | Admitting: Internal Medicine

## 2023-09-04 DIAGNOSIS — G4734 Idiopathic sleep related nonobstructive alveolar hypoventilation: Secondary | ICD-10-CM

## 2023-09-04 NOTE — Telephone Encounter (Signed)
 ONO on RA (Adapt) 08/24/23  Sats <88% ra for 11 min 33 sec  Sats < 89%ra 15 min 25 sec  Sats >90% 7 hours 58 min   Routing to MR and placed paper copy in lookat to be signed

## 2023-09-05 NOTE — Telephone Encounter (Signed)
 Spoke with pt and notified of results per Dr. Bertrum Brodie. Pt verbalized understanding and denied any questions. O2 order was placed. Nothing further needed.

## 2023-09-05 NOTE — Telephone Encounter (Signed)
 Blood allergy panel negative  ONO slight positive - given her degree of dyspnea - she should do 2L [place] at bedtime for few months and see if day time dyspnea improves

## 2023-09-12 ENCOUNTER — Telehealth: Payer: Self-pay | Admitting: Internal Medicine

## 2023-09-12 DIAGNOSIS — M47816 Spondylosis without myelopathy or radiculopathy, lumbar region: Secondary | ICD-10-CM | POA: Diagnosis not present

## 2023-09-12 NOTE — Telephone Encounter (Signed)
 Maire Scot, MD    09/12/23  1:52 PM Note Ok that is fine

## 2023-09-12 NOTE — Telephone Encounter (Signed)
 Ok that is fine

## 2023-09-12 NOTE — Telephone Encounter (Signed)
 Mitch called to say pt refused O2 delivery late last week. Called and spoke with patient and she verbalized that she had refused upon delivery, stated that she would just wake up in the morning and check her pulse oximeter that [facility] had provided her as they were monitoring her O2 levels. States that she thinks it is a fluke that she needs it at night and when she saw all the supplies about to be delivered she just didn't want to deal with it.

## 2023-09-15 DIAGNOSIS — M47816 Spondylosis without myelopathy or radiculopathy, lumbar region: Secondary | ICD-10-CM | POA: Diagnosis not present

## 2023-09-16 DIAGNOSIS — I10 Essential (primary) hypertension: Secondary | ICD-10-CM | POA: Diagnosis not present

## 2023-09-16 DIAGNOSIS — J449 Chronic obstructive pulmonary disease, unspecified: Secondary | ICD-10-CM | POA: Diagnosis not present

## 2023-09-16 DIAGNOSIS — I4891 Unspecified atrial fibrillation: Secondary | ICD-10-CM | POA: Diagnosis not present

## 2023-09-16 DIAGNOSIS — I5032 Chronic diastolic (congestive) heart failure: Secondary | ICD-10-CM | POA: Diagnosis not present

## 2023-09-21 DIAGNOSIS — J449 Chronic obstructive pulmonary disease, unspecified: Secondary | ICD-10-CM | POA: Diagnosis not present

## 2023-09-21 DIAGNOSIS — I4891 Unspecified atrial fibrillation: Secondary | ICD-10-CM | POA: Diagnosis not present

## 2023-09-21 DIAGNOSIS — I5032 Chronic diastolic (congestive) heart failure: Secondary | ICD-10-CM | POA: Diagnosis not present

## 2023-09-21 DIAGNOSIS — I10 Essential (primary) hypertension: Secondary | ICD-10-CM | POA: Diagnosis not present

## 2023-09-23 DIAGNOSIS — M47816 Spondylosis without myelopathy or radiculopathy, lumbar region: Secondary | ICD-10-CM | POA: Diagnosis not present

## 2023-09-25 ENCOUNTER — Encounter: Payer: Self-pay | Admitting: Internal Medicine

## 2023-10-16 DIAGNOSIS — I10 Essential (primary) hypertension: Secondary | ICD-10-CM | POA: Diagnosis not present

## 2023-10-16 DIAGNOSIS — I4891 Unspecified atrial fibrillation: Secondary | ICD-10-CM | POA: Diagnosis not present

## 2023-10-16 DIAGNOSIS — J449 Chronic obstructive pulmonary disease, unspecified: Secondary | ICD-10-CM | POA: Diagnosis not present

## 2023-10-16 DIAGNOSIS — I5032 Chronic diastolic (congestive) heart failure: Secondary | ICD-10-CM | POA: Diagnosis not present

## 2023-10-21 DIAGNOSIS — J449 Chronic obstructive pulmonary disease, unspecified: Secondary | ICD-10-CM | POA: Diagnosis not present

## 2023-10-21 DIAGNOSIS — I5032 Chronic diastolic (congestive) heart failure: Secondary | ICD-10-CM | POA: Diagnosis not present

## 2023-10-21 DIAGNOSIS — I10 Essential (primary) hypertension: Secondary | ICD-10-CM | POA: Diagnosis not present

## 2023-10-21 DIAGNOSIS — I4891 Unspecified atrial fibrillation: Secondary | ICD-10-CM | POA: Diagnosis not present

## 2023-10-22 ENCOUNTER — Ambulatory Visit: Payer: Self-pay | Admitting: Internal Medicine

## 2023-10-22 NOTE — Progress Notes (Signed)
 Normal result

## 2023-11-16 DIAGNOSIS — J449 Chronic obstructive pulmonary disease, unspecified: Secondary | ICD-10-CM | POA: Diagnosis not present

## 2023-11-16 DIAGNOSIS — I4891 Unspecified atrial fibrillation: Secondary | ICD-10-CM | POA: Diagnosis not present

## 2023-11-16 DIAGNOSIS — I5032 Chronic diastolic (congestive) heart failure: Secondary | ICD-10-CM | POA: Diagnosis not present

## 2023-11-16 DIAGNOSIS — I10 Essential (primary) hypertension: Secondary | ICD-10-CM | POA: Diagnosis not present

## 2023-11-17 DIAGNOSIS — M47816 Spondylosis without myelopathy or radiculopathy, lumbar region: Secondary | ICD-10-CM | POA: Diagnosis not present

## 2023-11-20 DIAGNOSIS — I4891 Unspecified atrial fibrillation: Secondary | ICD-10-CM | POA: Diagnosis not present

## 2023-11-20 DIAGNOSIS — I10 Essential (primary) hypertension: Secondary | ICD-10-CM | POA: Diagnosis not present

## 2023-11-20 DIAGNOSIS — J449 Chronic obstructive pulmonary disease, unspecified: Secondary | ICD-10-CM | POA: Diagnosis not present

## 2023-11-20 DIAGNOSIS — I5032 Chronic diastolic (congestive) heart failure: Secondary | ICD-10-CM | POA: Diagnosis not present

## 2023-11-30 ENCOUNTER — Ambulatory Visit (INDEPENDENT_AMBULATORY_CARE_PROVIDER_SITE_OTHER): Admitting: Nurse Practitioner

## 2023-11-30 ENCOUNTER — Encounter: Payer: Self-pay | Admitting: Nurse Practitioner

## 2023-11-30 VITALS — BP 149/80 | HR 77 | Temp 97.5°F | Ht 68.0 in | Wt 195.8 lb

## 2023-11-30 DIAGNOSIS — R062 Wheezing: Secondary | ICD-10-CM

## 2023-11-30 DIAGNOSIS — K449 Diaphragmatic hernia without obstruction or gangrene: Secondary | ICD-10-CM | POA: Diagnosis not present

## 2023-11-30 DIAGNOSIS — R918 Other nonspecific abnormal finding of lung field: Secondary | ICD-10-CM

## 2023-11-30 DIAGNOSIS — R053 Chronic cough: Secondary | ICD-10-CM

## 2023-11-30 DIAGNOSIS — R0609 Other forms of dyspnea: Secondary | ICD-10-CM

## 2023-11-30 DIAGNOSIS — J45991 Cough variant asthma: Secondary | ICD-10-CM

## 2023-11-30 DIAGNOSIS — Z8719 Personal history of other diseases of the digestive system: Secondary | ICD-10-CM

## 2023-11-30 MED ORDER — QVAR REDIHALER 80 MCG/ACT IN AERB: 1.0000 | INHALATION_SPRAY | Freq: Two times a day (BID) | RESPIRATORY_TRACT | 2 refills | Status: DC | PRN

## 2023-11-30 NOTE — Patient Instructions (Addendum)
 Qvar  1-2 puffs Twice daily as needed for shortness of breath or wheezing. Brush tongue and rinse mouth afterwards   Repeat CT chest in March 2026   Follow up in March 2026 after repeat CT chest with Dr. Geronimo or Izetta Malachy PIETY. If symptoms do not improve or worsen, please contact office for sooner follow up or seek emergency care.

## 2023-11-30 NOTE — Progress Notes (Signed)
 @Patient  ID: Natasha [name], female    DOB: February 25, 1933, 89 y.o.   MRN: [id number]  Chief Complaint  Patient presents with   Follow-up    2m f/u. Pt is still having SOB with exertion, but none at rest. 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  She is patient Dr. Reeves and last seen in office 08/29/2023.  Past medical history significant for diastolic heart failure, PAF on Eliquis , hypertension, hiatal hernia, GERD, hypothyroid.  TEST/EVENTS:  03/06/2018 PFT: FVC 70, FEV1 73, TLC 80, DLCOcor 59 06/14/2023 echo: EF 60 to 65%.  G3 DD.  Elevated left atrial pressure.  RV size and function normal.  Mild to moderate MR.  Trivial AR. 05/2023 FeNO nl 06/19/2023 HRCT chest: Substantial amount of motion artifact.  Atherosclerosis.  Numerous solid pulmonary nodules scattered throughout both lungs, largest 1 cm in the right upper lobe, all stable compared to prior.  Extensive patchy air trapping throughout both lungs.  Chronic large hiatal hernia. 08/2023 RAST negative  08/29/2023: OV with Dr. Geronimo.  No new complaints.  Has shortness of breath with exertion that is stable.  Echocardiogram from February 2025 shows grade 3 diastolic dysfunction.  Chronic cough also continues.  High-resolution CT chest shows multiple lung nodules with a lot of motion artifact.  Radiologist concerned about DIPNECH, which is likely possibility.  Recommended inhaled steroids with Arnuity.  Also recommended getting allergy testing.  Did have skin testing years ago was positive for mold.  Hiatal hernia could also be playing a role in chronic cough.  Continue to manage through primary care and GI doctor.  11/30/2023: Today - follow up Discussed the use of AI scribe software for clinical note transcription with the patient, who gave verbal consent to  proceed.  History of Present Illness Natasha [name] is an 89 year old female who presents for follow up.  She experiences an intermittent, raspy cough that often occurs after hearing a 'little squealing' sound upon exhalation. The cough is not constant and tends to clear up after coughing. It's minimal at this time. She previously used Arnuity for about four weeks without significant improvement and did not refill it.  She has a history of lung nodules that have been stable and are monitored annually with CT scans. There has been no growth in the nodules. Felt to be DIPNECH.  She experiences shortness of breath with activity but feels fine when sitting. Her breathing is stable. She was advised to have repeat PFT but this was not scheduled. Last test was in 2019. No fever, hemoptysis, or significant wheezing. No leg swelling, orthopnea, PND, lightheadedness/dizziness, syncope. No current inhaler use.     Allergies  Allergen Reactions   Codeine Nausea And Vomiting and Nausea Only   Penicillins Hives    Has patient had a PCN reaction causing immediate rash, facial/tongue/throat swelling, SOB or lightheadedness with hypotension: No Has patient had a PCN reaction causing severe rash involving mucus membranes or skin necrosis: No Has patient had a PCN reaction that required hospitalization No Has patient had a PCN reaction occurring within the last 10 years: No If all of the above answers are NO, then may proceed with Cephalosporin use.   Tetracycline Rash   Tramadol Nausea Only   Digoxin  Other (See Comments)    Flushed neck and face   Digoxin   And Related Other (See Comments)    ' Makes my face blood red   Erythromycin Other (See Comments) and Hives    Flushing on face   Sulfa Antibiotics Other (See Comments)    PRESERVATIVE OF SOME FOODS- MAKES FLUSHES TO FACE AND NECK   Tetracyclines & Related Other (See Comments)    Sores on legs   Diltiazem  Hcl Rash    Rash with long acting form    Lyrica  [Pregabalin ] Rash   Sulfites Rash    PRESERVATIVE OF SOME FOODS- MAKES FLUSHES TO FACE AND NECK PRESERVATIVE OF SOME FOODS- MAKES FLUSHES TO FACE AND NECK    Immunization History  Administered Date(s) Administered   Fluad Trivalent(High Dose 65+) 03/04/2023   Influenza Split 04/18/2014   Influenza-Unspecified 12/16/2014   Pneumococcal Conjugate-13 04/24/2014    Past Medical History:  Diagnosis Date   Aortic atherosclerosis (HCC)    CHF (congestive heart failure) (HCC)    pt. denies   Chronic diastolic heart failure (HCC) 04/22/2015   Complication of anesthesia    COPD (chronic obstructive pulmonary disease) (HCC)    Diverticular disease    DJD (degenerative joint disease)    Dysphagia    Dysrhythmia    afib   GERD (gastroesophageal reflux disease)    Hiatal hernia    Hypertension    Hypothyroidism    Lower leg edema    Neuromuscular disorder (HCC)    neuropathy   PAF (paroxysmal atrial fibrillation) (HCC)    s/p ablation x several times now with occasional episodes of PAF.   She has failed sotolol/dofetilide/flecainide/propafenone/Amio and dronedarone either with not controlling her PAF or having side effects   Pneumonia    PONV (postoperative nausea and vomiting)    Restless leg syndrome    Seasonal allergies     Tobacco History: Social History   Tobacco Use  Smoking Status Never   Passive exposure: Past  Smokeless Tobacco Never   Counseling given: Not Answered   Outpatient Medications Prior to Visit  Medication Sig Dispense Refill   acetaminophen  (TYLENOL ) 500 MG tablet Take 1,000 mg by mouth every 8 (eight) hours as needed for moderate pain.     apixaban  (ELIQUIS ) 5 MG TABS tablet TAKE 1 TABLET BY MOUTH TWO TIMES DAILY. 60 tablet 5   augmented betamethasone dipropionate (DIPROLENE-AF) 0.05 % cream Apply topically daily as needed.     butalbital-acetaminophen -caffeine (FIORICET) 50-325-40 MG tablet Take 1 tablet by mouth 2 (two) times daily as  needed for headache.     esomeprazole (NEXIUM) 20 MG capsule Take 20 mg by mouth daily at 12 noon. (Patient taking differently: Take 20 mg by mouth daily at 12 noon.)     estradiol  (CLIMARA  - DOSED IN MG/24 HR) 0.025 mg/24hr patch Place 0.025 mg onto the skin once a week.     furosemide  (LASIX ) 40 MG tablet TAKE 1 AND 1/2 TABLETS BY MOUTH DAILY. (Patient taking differently: TAKE 1 AND 1/2 TABLETS BY MOUTH DAILY.) 135 tablet 2   LORazepam  (ATIVAN ) 1 MG tablet Take 0.5-1 mg by mouth at bedtime as needed for sleep. For sleep     MAGNESIUM  PO Take 400 mg by mouth daily.     mupirocin ointment (BACTROBAN) 2 % Apply 1 application topically 2 (two) times daily as needed (wound care).     OVER THE COUNTER MEDICATION Apply 1 application topically at bedtime as needed (pain). [employer]raworks pain relief cream     potassium chloride  (KLOR-CON ) 10 MEQ tablet TAKE 1 TABLET  BY MOUTH DAILY (Patient taking differently: TAKE 1 TABLET BY MOUTH DAILY) 90 tablet 3   pramipexole  (MIRAPEX ) 1 MG tablet Take 1 mg by mouth at bedtime.     Riboflavin 400 MG CAPS Take 400 mg by mouth daily.     sennosides-docusate sodium  (SENOKOT-S) 8.6-50 MG tablet Take 1 tablet by mouth daily as needed for constipation.     SYNTHROID  125 MCG tablet Take 125 mcg by mouth every morning.     valsartan (DIOVAN) 80 MG tablet Take 40 mg by mouth daily as needed (Take 40 mg if blood pressure is over 130).     Vitamin D, Cholecalciferol, 50 MCG (2000 UT) CAPS Take 2,000 Units by mouth daily.     cyanocobalamin (VITAMIN B12) 1000 MCG tablet Take 1,000 mcg by mouth daily. (Patient not taking: Reported on 11/30/2023)     Fluticasone Furoate  (ARNUITY ELLIPTA ) 50 MCG/ACT AEPB Inhale 1 puff into the lungs daily. (Patient not taking: Reported on 11/30/2023) 1 each 3   verapamil  (CALAN -SR) 120 MG CR tablet Take 120 mg by mouth daily as needed (Tachycardia). (Patient not taking: Reported on 11/30/2023)     No facility-administered medications prior to visit.      Review of Systems:   Constitutional: No weight loss or gain, night sweats, fevers, chills, or lassitude. +fatigue (baseline) HEENT: No headaches, difficulty swallowing, tooth/dental problems, or sore throat. No sneezing, itching, ear ache, nasal congestion, or post nasal drip CV:  No chest pain, orthopnea, PND, swelling in lower extremities, anasarca, dizziness, palpitations, syncope Resp: +baseline shortness of breath with exertion; intermittent dry cough with wheeze. No excess mucus or change in color of mucus.  No hemoptysis. No chest wall deformity GI:  No heartburn, indigestion, loss of appetite,  Neuro: No dizziness or lightheadedness.  Psych: No depression or anxiety. Mood stable.     Physical Exam:  BP (!) 149/80   Pulse 77   Temp (!) 97.5 F (36.4 C)   Ht 5' 8 (1.727 m)   Wt 195 lb 12.8 oz (88.8 kg)   SpO2 96% Comment: RA  BMI 29.77 kg/m   GEN: Pleasant, interactive, well-appearing; in no acute distress HEENT:  Normocephalic and atraumatic. PERRLA. Sclera white. Nasal turbinates pink, moist and patent bilaterally. No rhinorrhea present. Oropharynx pink and moist, without exudate or edema. No lesions, ulcerations, or postnasal drip.  NECK:  Supple w/ fair ROM. No JVD present. No lymphadenopathy.   CV: RRR, no m/r/g, no peripheral edema. Pulses intact, +2 bilaterally. No cyanosis, pallor or clubbing. PULMONARY:  Unlabored, regular breathing. Clear bilaterally A&P w/o wheezes/rales/rhonchi. No accessory muscle use.  GI: BS present and normoactive. Soft, non-tender to palpation. No organomegaly or masses detected.  MSK: No erythema, warmth or tenderness. Cap refil <2 sec all extrem.  Neuro: A/Ox3. No focal deficits noted.   Skin: Warm, no lesions or rashe Psych: Normal affect and behavior. Judgement and thought content appropriate.     Lab Results:  CBC    Component Value Date/Time   WBC 5.1 05/26/2023 1555   WBC 4.9 10/16/2020 1500   RBC 4.18 05/26/2023 1555    RBC 4.22 10/16/2020 1500   HGB 13.9 05/26/2023 1555   HCT 40.2 05/26/2023 1555   PLT 237 05/26/2023 1555   MCV 96 05/26/2023 1555   MCH 33.3 (H) 05/26/2023 1555   MCH 33.9 10/16/2020 1500   MCHC 34.6 05/26/2023 1555   MCHC 35.3 10/16/2020 1500   RDW 12.8 05/26/2023 1555   LYMPHSABS 1.0 05/26/2023  1555   MONOABS 0.7 04/22/2015 1652   EOSABS 0.1 05/26/2023 1555   BASOSABS 0.0 05/26/2023 1555    BMET    Component Value Date/Time   NA 132 (L) 05/26/2023 1555   K 4.4 05/26/2023 1555   CL 96 05/26/2023 1555   CO2 20 05/26/2023 1555   GLUCOSE 84 05/26/2023 1555   GLUCOSE 92 10/28/2020 0640   BUN 15 05/26/2023 1555   CREATININE 0.83 05/26/2023 1555   CREATININE 0.93 (H) 01/12/2016 1106   CALCIUM 9.0 05/26/2023 1555   GFRNONAA >60 10/28/2020 0640   GFRAA 81 04/03/2020 1253    BNP No results found for: BNP   Imaging:  No results found.  Administration History     None          Latest Ref Rng & Units 03/05/2018    1:39 PM 05/12/2014    3:22 PM  PFT Results  FVC-Pre L 2.12  2.35   FVC-Predicted Pre % 70  72   Pre FEV1/FVC % % 78  76   FEV1-Pre L 1.64  1.78   FEV1-Predicted Pre % 73  72   DLCO uncorrected ml/min/mmHg 18.02  18.26   DLCO UNC% % 60  58   DLCO corrected ml/min/mmHg 17.65    DLCO COR %Predicted % 59    DLVA Predicted % 99  80   TLC L 4.55    TLC % Predicted % 80    RV % Predicted % 89      No results found for: NITRICOXIDE      Assessment & Plan:   No problem-specific Assessment & Plan notes found for this encounter. Assessment and Plan Assessment & Plan Chronic cough with intermittent wheezing and possible small airway disease Cough variant asthma  Intermittent cough with raspy quality and wheezing upon exhalation. Unclear if there was much benefit with prior ICS. Willing to retrial PRN. CT changes suggest possible small airway disease or asthma component. Breathing is stable at rest but slightly breathy with activity. Action  plan in place - Prescribe Qvar  inhaler Twice daily as needed for episodes of wheezing and cough. Reassess response. Avoid DPIs - Advise rinsing mouth after inhaler use to prevent oral side effects. - Repeat lung function testing to assess for any decline in lung function. - Instruct to call if wheezing persists or worsens despite inhaler use. - Graded exercises encouraged   Stable pulmonary nodules under annual surveillance Pulmonary nodules present, stable on prior CT. Nodules are consistent with DIPNECH, potentially precursors for carcinoid tumors. Continued monitoring - Schedule annual chest CT in March 2026 to monitor nodule stability.  DOE Likely related to GIII DD and diastolic dysfunction. Possible small airways disease/asthma playing a role as well. See above. Euvolemic on exam. She is in NSR today  - Follow up with cardiology as scheduled   Hiatal hernia Likely contributing to cough - Continue GERD precautions - Follow up with GI/PCP    Advised if symptoms do not improve or worsen, to please contact office for sooner follow up or seek emergency care.   I spent 35 minutes of dedicated to the care of this patient on the date of this encounter to include pre-visit review of records, face-to-face time with the patient discussing conditions above, post visit ordering of testing, clinical documentation with the electronic health record, making appropriate referrals as documented, and communicating necessary findings to members of the patients care team.  Comer LULLA Rouleau, NP 11/30/2023  Pt aware and understands NP's  role.

## 2023-12-11 DIAGNOSIS — M47816 Spondylosis without myelopathy or radiculopathy, lumbar region: Secondary | ICD-10-CM | POA: Diagnosis not present

## 2023-12-15 ENCOUNTER — Ambulatory Visit
Admission: RE | Admit: 2023-12-15 | Discharge: 2023-12-15 | Disposition: A | Discharge status: Home / Self Care | Source: Ambulatory Visit | Attending: Internal Medicine | Admitting: Internal Medicine

## 2023-12-15 DIAGNOSIS — Z1231 Encounter for screening mammogram for malignant neoplasm of breast: Secondary | ICD-10-CM

## 2023-12-17 DIAGNOSIS — I4891 Unspecified atrial fibrillation: Secondary | ICD-10-CM | POA: Diagnosis not present

## 2023-12-17 DIAGNOSIS — I10 Essential (primary) hypertension: Secondary | ICD-10-CM | POA: Diagnosis not present

## 2023-12-17 DIAGNOSIS — I5032 Chronic diastolic (congestive) heart failure: Secondary | ICD-10-CM | POA: Diagnosis not present

## 2023-12-17 DIAGNOSIS — J449 Chronic obstructive pulmonary disease, unspecified: Secondary | ICD-10-CM | POA: Diagnosis not present

## 2023-12-20 ENCOUNTER — Ambulatory Visit
Admission: EM | Admit: 2023-12-20 | Discharge: 2023-12-20 | Disposition: A | Discharge status: Home / Self Care | Attending: Physician Assistant | Admitting: Physician Assistant

## 2023-12-20 DIAGNOSIS — G43709 Chronic migraine without aura, not intractable, without status migrainosus: Secondary | ICD-10-CM | POA: Insufficient documentation

## 2023-12-20 DIAGNOSIS — I10 Essential (primary) hypertension: Secondary | ICD-10-CM | POA: Diagnosis not present

## 2023-12-20 DIAGNOSIS — I5032 Chronic diastolic (congestive) heart failure: Secondary | ICD-10-CM | POA: Insufficient documentation

## 2023-12-20 DIAGNOSIS — J449 Chronic obstructive pulmonary disease, unspecified: Secondary | ICD-10-CM | POA: Diagnosis not present

## 2023-12-20 DIAGNOSIS — M545 Low back pain, unspecified: Secondary | ICD-10-CM | POA: Insufficient documentation

## 2023-12-20 DIAGNOSIS — B351 Tinea unguium: Secondary | ICD-10-CM | POA: Insufficient documentation

## 2023-12-20 DIAGNOSIS — E039 Hypothyroidism, unspecified: Secondary | ICD-10-CM | POA: Insufficient documentation

## 2023-12-20 DIAGNOSIS — G43009 Migraine without aura, not intractable, without status migrainosus: Secondary | ICD-10-CM | POA: Insufficient documentation

## 2023-12-20 DIAGNOSIS — M170 Bilateral primary osteoarthritis of knee: Secondary | ICD-10-CM | POA: Insufficient documentation

## 2023-12-20 DIAGNOSIS — I4891 Unspecified atrial fibrillation: Secondary | ICD-10-CM | POA: Diagnosis not present

## 2023-12-20 DIAGNOSIS — R300 Dysuria: Secondary | ICD-10-CM | POA: Diagnosis not present

## 2023-12-20 DIAGNOSIS — N951 Menopausal and female climacteric states: Secondary | ICD-10-CM | POA: Insufficient documentation

## 2023-12-20 DIAGNOSIS — Z7901 Long term (current) use of anticoagulants: Secondary | ICD-10-CM | POA: Insufficient documentation

## 2023-12-20 DIAGNOSIS — G629 Polyneuropathy, unspecified: Secondary | ICD-10-CM | POA: Insufficient documentation

## 2023-12-20 DIAGNOSIS — K219 Gastro-esophageal reflux disease without esophagitis: Secondary | ICD-10-CM | POA: Insufficient documentation

## 2023-12-20 DIAGNOSIS — G43909 Migraine, unspecified, not intractable, without status migrainosus: Secondary | ICD-10-CM | POA: Insufficient documentation

## 2023-12-20 DIAGNOSIS — E049 Nontoxic goiter, unspecified: Secondary | ICD-10-CM | POA: Insufficient documentation

## 2023-12-20 DIAGNOSIS — G2581 Restless legs syndrome: Secondary | ICD-10-CM | POA: Insufficient documentation

## 2023-12-20 DIAGNOSIS — E559 Vitamin D deficiency, unspecified: Secondary | ICD-10-CM | POA: Insufficient documentation

## 2023-12-20 DIAGNOSIS — R918 Other nonspecific abnormal finding of lung field: Secondary | ICD-10-CM | POA: Insufficient documentation

## 2023-12-20 LAB — POCT URINE DIPSTICK
Blood, UA: NEGATIVE
Glucose, UA: 100 mg/dL — AB
Nitrite, UA: POSITIVE — AB
POC PROTEIN,UA: 30 — AB
Spec Grav, UA: 1.01 (ref 1.010–1.025)
Urobilinogen, UA: 2 U/dL — AB
pH, UA: 6.5 (ref 5.0–8.0)

## 2023-12-20 MED ORDER — CIPROFLOXACIN HCL 500 MG PO TABS: 500.0000 mg | ORAL_TABLET | Freq: Two times a day (BID) | ORAL | 0 refills | Status: DC

## 2023-12-20 MED ORDER — PHENAZOPYRIDINE HCL 200 MG PO TABS
200.0000 mg | ORAL_TABLET | Freq: Three times a day (TID) | ORAL | 0 refills | Status: AC
Start: 2023-12-20 — End: ?

## 2023-12-20 NOTE — ED Triage Notes (Signed)
 I have a Urinary Tract Infection. I started with symptoms a few days ago (frequency with urination) and bladder spasm. I started some Uri stat and now I need an antibiotic. No fever known.

## 2023-12-21 NOTE — ED Provider Notes (Signed)
 [name]    CSN: [id number] Arrival date & time: 12/20/23  1130      History   Chief Complaint Chief Complaint  Patient presents with   UTI Symptoms    HPI Natasha [name] is a 89 y.o. female.   Patient here today for evaluation of possible UTI.  She reports that she has had some frequency and mild dysuria with bladder spasms.  She reports that she tried Uristat without significant improvement.  She denies any fever.  The history is provided by the patient.    Past Medical History:  Diagnosis Date   Aortic atherosclerosis (HCC)    CHF (congestive heart failure) (HCC)    pt. denies   Chronic diastolic heart failure (HCC) 04/22/2015   Complication of anesthesia    COPD (chronic obstructive pulmonary disease) (HCC)    Diverticular disease    DJD (degenerative joint disease)    Dysphagia    Dysrhythmia    afib   GERD (gastroesophageal reflux disease)    Hiatal hernia    Hypertension    Hypothyroidism    Lower leg edema    Lower leg edema    Neuromuscular disorder (HCC)    neuropathy   PAF (paroxysmal atrial fibrillation) (HCC)    s/p ablation x several times now with occasional episodes of PAF.   She has failed sotolol/dofetilide/flecainide/propafenone/Amio and dronedarone either with not controlling her PAF or having side effects   Pneumonia    PONV (postoperative nausea and vomiting)    Restless leg syndrome    Seasonal allergies     Patient Active Problem List   Diagnosis Date Noted   Chronic low back pain 12/20/2023   Long term current use of anticoagulant therapy 12/20/2023   Hot flashes due to menopause 12/20/2023   Menopausal symptom 12/20/2023   Migraine, unspecified, not intractable, without status migrainosus 12/20/2023   Non-refractory chronic migraine without aura 12/20/2023   Migraine 12/20/2023   Migraine without aura and responsive to treatment 12/20/2023   Onychomycosis 12/20/2023   Peripheral neuropathy 12/20/2023   Vitamin D  deficiency, unspecified 12/20/2023   Diastolic CHF, chronic (HCC) 12/20/2023   Gastro-esophageal reflux disease without esophagitis 12/20/2023   Essential hypertension 12/20/2023   Goiter 12/20/2023   Hypothyroidism, unspecified 12/20/2023   Multiple nodules of lung 12/20/2023   Arthritis of both knees 12/20/2023   Osteoarthritis of both knees 12/20/2023   Restless legs syndrome 12/20/2023   Aortic atherosclerosis (HCC)    Acquired thrombophilia (HCC) 05/11/2023   Lumbar radiculopathy 05/11/2023   Atrial fibrillation (HCC) 05/11/2023   Osteoarthritis of right knee 10/28/2020   Degeneration of lumbar intervertebral disc 09/04/2019   Pain in right knee 07/05/2017   Pain in left knee 07/05/2017   Chronic fatigue 01/10/2017   Chronic diastolic heart failure (HCC) 04/22/2015   Atrial ectopic tachycardia (HCC) 01/26/2015   Nodule of right lung 09/07/2014   Bradycardia 12/25/2013   SOB (shortness of breath) 12/12/2013   Femoral bruit 08/27/2013   Claudication (HCC) 08/02/2013   GERD (gastroesophageal reflux disease)    Hypertension    Hypothyroidism    Restless leg syndrome    Diverticular disease    PONV (postoperative nausea and vomiting)    Hiatal hernia    DJD (degenerative joint disease) 05/18/2011   History of anticoagulant therapy 05/18/2011   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   History of migraine headaches 05/18/2011   Persistent atrial fibrillation (HCC) 05/18/2011   Anticoagulated on warfarin 05/18/2011  Hashimoto's thyroiditis 05/18/2011   Atypical chest pain 05/18/2011   Dysphagia 03/03/2011    Past Surgical History:  Procedure Laterality Date   ABDOMINAL HYSTERECTOMY     APPENDECTOMY     ATRIAL FIBRILLATION ABLATION  09/06/2013   2'14 -[facility]   BALLOON DILATION  03/03/2011   Procedure: BALLOON DILATION;  Surgeon: Gladis MARLA Louder, MD;  Location: [facility];  Service: Endoscopy;  Laterality: N/A;   bladder polyps     BREAST  EXCISIONAL BIOPSY Left    BREAST SURGERY     left lumpectomy   CARDIOVERSION     x3   CARDIOVERSION N/A 12/30/2014   Procedure: CARDIOVERSION;  Surgeon: Redell GORMAN Shallow, MD;  Location: [facility] ENDOSCOPY;  Service: Cardiovascular;  Laterality: N/A;   CARDIOVERSION N/A 04/23/2015   Procedure: CARDIOVERSION;  Surgeon: Jerel Balding, MD;  Location: [facility] ENDOSCOPY;  Service: Cardiovascular;  Laterality: N/A;   CATARACT EXTRACTION Left    COLONOSCOPY WITH PROPOFOL  N/A 09/24/2013   Procedure: COLONOSCOPY WITH PROPOFOL ;  Surgeon: Gladis MARLA Louder, MD;  Location: [facility];  Service: Endoscopy;  Laterality: N/A;   ESOPHAGOGASTRODUODENOSCOPY  03/03/2011   Procedure: ESOPHAGOGASTRODUODENOSCOPY (EGD);  Surgeon: Gladis MARLA Louder, MD;  Location: [facility];  Service: Endoscopy;  Laterality: N/A;   ESOPHAGOGASTRODUODENOSCOPY (EGD) WITH PROPOFOL  Bilateral 03/04/2022   Procedure: ESOPHAGOGASTRODUODENOSCOPY (EGD) WITH PROPOFOL ;  Surgeon: Rosalie Kitchens, MD;  Location: [facility];  Service: Gastroenterology;  Laterality: Bilateral;   EYE SURGERY     cataracat   RHINOPLASTY     RIGHT HEART CATHETERIZATION N/A 07/11/2014   Procedure: RIGHT HEART CATH;  Surgeon: Ezra GORMAN Shuck, MD;  Location: [facility] C[facility] LAB;  Service: Cardiovascular;  Laterality: N/A;   ROTATOR CUFF REPAIR     THYROIDECTOMY  1973   TONSILLECTOMY  1942   TOTAL KNEE ARTHROPLASTY Right 10/28/2020   Procedure: TOTAL KNEE ARTHROPLASTY;  Surgeon: Gerome Charleston, MD;  Location: [facility];  Service: Orthopedics;  Laterality: Right;  with adductor canal   TUBAL LIGATION      OB History   No obstetric history on file.      Home Medications    Prior to Admission medications   Medication Sig Start Date End Date Taking? Authorizing Provider  cefadroxil (DURICEF) 500 MG capsule Take 1 capsule by mouth 2 (two) times daily. 01/10/23  Yes [provider]  ciprofloxacin  (CIPRO ) 500 MG tablet Take 1 tablet (500 mg total) by mouth every 12 (twelve)  hours. 12/20/23  Yes Billy Asberry FALCON, PA-C  HYDROcodone  bit-homatropine [facility]) 5-1.5 MG/5ML syrup Take 5 mLs by mouth. 06/28/23  Yes [provider]  phenazopyridine  (PYRIDIUM ) 200 MG tablet Take 1 tablet (200 mg total) by mouth 3 (three) times daily. 12/20/23  Yes Billy Asberry FALCON, PA-C  predniSONE (DELTASONE) 10 MG tablet Take 10 mg by mouth as directed. 06/28/23  Yes [provider]  promethazine  (PHENERGAN ) 12.5 MG tablet Take 12.5 mg by mouth every 8 (eight) hours as needed. 09/15/23  Yes [provider]  promethazine  (PHENERGAN ) 25 MG tablet Take 25 mg by mouth every 6 (six) hours as needed. 11/25/22  Yes [provider]  traMADol (ULTRAM) 50 MG tablet Take 50 mg by mouth 3 (three) times daily. 09/15/23  Yes [provider]  acetaminophen  (TYLENOL ) 500 MG tablet Take 1,000 mg by mouth every 8 (eight) hours as needed for moderate pain.    [provider]  apixaban  (ELIQUIS ) 5 MG TABS tablet TAKE 1 TABLET BY MOUTH TWO TIMES DAILY. 06/19/23  Shlomo Wilbert SAUNDERS, MD  augmented betamethasone dipropionate (DIPROLENE-AF) 0.05 % cream Apply topically daily as needed. 04/28/23   [provider]  beclomethasone (QVAR  REDIHALER) 80 MCG/ACT inhaler Inhale 1-2 puffs into the lungs 2 (two) times daily as needed (shortness of breath or wheezing). 11/30/23   Cobb, Comer GAILS, NP  benzonatate  (TESSALON ) 100 MG capsule Take 100 mg by mouth 2 (two) times daily as needed.    [provider]  butalbital-acetaminophen -caffeine (FIORICET) 50-325-40 MG tablet Take 1 tablet by mouth 2 (two) times daily as needed for headache.    [provider]  Calcium-Magnesium -Vitamin D (CITRACAL CALCIUM+D) 600-40-500 MG-MG-UNIT TB24 Take 1 tablet by mouth daily at 2 PM.    [provider]  cyanocobalamin (VITAMIN B12) 1000 MCG tablet Take 1,000 mcg by mouth daily. Patient not taking: No sig reported    [provider]  esomeprazole (NEXIUM) 20 MG  capsule Take 20 mg by mouth daily at 12 noon. Patient taking differently: Take 20 mg by mouth daily at 12 noon.    [provider]  estradiol  (CLIMARA  - DOSED IN MG/24 HR) 0.025 mg/24hr patch Place 0.025 mg onto the skin once a week. 06/03/19   [provider]  Fluticasone Furoate  (ARNUITY ELLIPTA ) 50 MCG/ACT AEPB Inhale 1 puff into the lungs daily. Patient not taking: No sig reported 08/29/23   Geronimo Amel, MD  furosemide  (LASIX ) 40 MG tablet TAKE 1 AND 1/2 TABLETS BY MOUTH DAILY. Patient taking differently: TAKE 1 AND 1/2 TABLETS BY MOUTH DAILY. 07/25/23   Waddell Danelle ORN, MD  LORazepam  (ATIVAN ) 1 MG tablet Take 0.5-1 mg by mouth at bedtime as needed for sleep. For sleep    [provider]  MAGNESIUM  PO Take 400 mg by mouth daily.    [provider]  molnupiravir EUA (LAGEVRIO) 200 MG CAPS capsule Take 4 capsules by mouth 2 (two) times daily.    [provider]  mupirocin ointment (BACTROBAN) 2 % Apply 1 application topically 2 (two) times daily as needed (wound care).    [provider]  OVER THE COUNTER MEDICATION Apply 1 application topically at bedtime as needed (pain). [employer] pain relief cream    [provider]  potassium chloride  (KLOR-CON ) 10 MEQ tablet TAKE 1 TABLET BY MOUTH DAILY Patient taking differently: TAKE 1 TABLET BY MOUTH DAILY 05/17/23   Waddell Danelle ORN, MD  pramipexole  (MIRAPEX ) 1 MG tablet Take 1 mg by mouth at bedtime.    [provider]  Riboflavin 400 MG CAPS Take 400 mg by mouth daily.    [provider]  sennosides-docusate sodium  (SENOKOT-S) 8.6-50 MG tablet Take 1 tablet by mouth daily as needed for constipation.    [provider]  SYNTHROID  125 MCG tablet Take 125 mcg by mouth every morning.    [provider]  terbinafine (LAMISIL) 250 MG tablet Take 250 mg by mouth daily.    [provider]  triamcinolone  (KENALOG ) 0.025 % cream Apply 1 Application  topically 2 (two) times daily.    [provider]  triamcinolone  cream (KENALOG ) 0.1 % Apply 1 Application topically 2 (two) times daily.    [provider]  valsartan (DIOVAN) 80 MG tablet Take 40 mg by mouth daily as needed (Take 40 mg if blood pressure is over 130). 10/05/20   [provider]  verapamil  (CALAN -SR) 120 MG CR tablet Take 120 mg by mouth daily as needed (Tachycardia). Patient not taking: No sig reported    [provider]  Vitamin D, Cholecalciferol, 50 MCG (2000 UT) CAPS Take 2,000 Units by mouth daily.    [provider]    Family History Family History  Problem Relation Age of Onset   Transient ischemic attack Mother    Heart failure Father    CVA Father    Lung cancer Father    Hypertension Sister    Cancer Brother    Sudden death Brother    Anesthesia problems Neg Hx    Hypotension Neg Hx    Malignant hyperthermia Neg Hx    Pseudochol deficiency Neg Hx    Heart attack Neg Hx     Social History Social History   Tobacco Use   Smoking status: Never    Passive exposure: Past   Smokeless tobacco: Never  Vaping Use   Vaping status: Never Used  Substance Use Topics   Alcohol use: Not Currently    Comment: rare wine   Drug use: No     Allergies   Codeine, Penicillins, Tetracycline, Tramadol, Cortisone, Digoxin , Digoxin  and related, Duloxetine  hcl, Erythromycin, Flecainide, Gabapentin , Nortriptyline , Omeprazole, Propofol , Sulfa antibiotics, Sumatriptan base, Tetracycline hcl, Tetracyclines & related, Diltiazem , Diltiazem  hcl, Pregabalin , and Sulfites   Review of Systems Review of Systems  Constitutional:  Negative for chills and fever.  Eyes:  Negative for discharge and redness.  Respiratory:  Negative for shortness of breath.   Gastrointestinal:  Negative for abdominal pain, nausea and vomiting.  Genitourinary:  Positive for dysuria and frequency.     Physical Exam Triage Vital Signs ED Triage Vitals   Encounter Vitals Group     BP 12/20/23 1303 138/76     Girls Systolic BP Percentile --      Girls Diastolic BP Percentile --      Boys Systolic BP Percentile --      Boys Diastolic BP Percentile --      Pulse Rate 12/20/23 1303 75     Resp 12/20/23 1303 18     Temp 12/20/23 1303 97.7 F (36.5 C)     Temp Source 12/20/23 1303 Oral     SpO2 12/20/23 1303 97 %     Weight 12/20/23 1301 195 lb 12.3 oz (88.8 kg)     Height 12/20/23 1301 5' 8 (1.727 m)     Head Circumference --      Peak Flow --      Pain Score 12/20/23 1258 0     Pain Loc --      Pain Education --      Exclude from Growth Chart --    No data found.  Updated Vital Signs BP 138/76 (BP Location: Left Arm)   Pulse 75   Temp 97.7 F (36.5 C) (Oral)   Resp 18   Ht 5' 8 (1.727 m)   Wt 195 lb 12.3 oz (88.8 kg)   SpO2 97%   BMI 29.77 kg/m   Visual Acuity Right Eye Distance:   Left Eye Distance:   Bilateral Distance:    Right Eye Near:   Left Eye Near:    Bilateral Near:     Physical Exam Vitals and nursing note reviewed.  Constitutional:      General: She is not in acute distress.    Appearance: Normal appearance. She is not ill-appearing.  HENT:     Head: Normocephalic and atraumatic.  Eyes:     Conjunctiva/sclera: Conjunctivae normal.  Cardiovascular:     Rate and Rhythm: Normal rate.  Pulmonary:  Effort: Pulmonary effort is normal. No respiratory distress.  Neurological:     Mental Status: She is alert.  Psychiatric:        Mood and Affect: Mood normal.        Behavior: Behavior normal.        Thought Content: Thought content normal.      UC Treatments / Results  Labs (all labs ordered are listed, but only abnormal results are displayed) Labs Reviewed  POCT URINE DIPSTICK - Abnormal; Notable for the following components:      Result Value   Color, UA orange (*)    Clarity, UA cloudy (*)    Glucose, UA =100 (*)    Bilirubin, UA small (*)    Ketones, POC UA trace (5) (*)    POC  PROTEIN,UA =30 (*)    Urobilinogen, UA 2.0 (*)    Nitrite, UA Positive (*)    Leukocytes, UA Large (3+) (*)    All other components within normal limits  URINE CULTURE    EKG   Radiology No results found.  Procedures Procedures (including critical care time)  Medications Ordered in UC Medications - No data to display  Initial Impression / Assessment and Plan / UC Course  I have reviewed the triage vital signs and the nursing notes.  Pertinent labs & imaging results that were available during my care of the patient were reviewed by me and considered in my medical decision making (see chart for details).    Cipro  prescribed to cover UTI and urine culture ordered.  Pyridium  prescribed at patient's request.  Recommended further evaluation if no gradual improvement or with any further concerns.  Final Clinical Impressions(s) / UC Diagnoses   Final diagnoses:  Dysuria   Discharge Instructions   None    ED Prescriptions     Medication Sig Dispense Auth. Provider   ciprofloxacin  (CIPRO ) 500 MG tablet Take 1 tablet (500 mg total) by mouth every 12 (twelve) hours. 10 tablet Billy Stabs F, PA-C   phenazopyridine  (PYRIDIUM ) 200 MG tablet Take 1 tablet (200 mg total) by mouth 3 (three) times daily. 6 tablet Billy Stabs FALCON, PA-C      PDMP not reviewed this encounter.   Billy Stabs FALCON, PA-C 12/21/23 1219

## 2023-12-22 ENCOUNTER — Ambulatory Visit (HOSPITAL_COMMUNITY): Payer: Self-pay

## 2023-12-22 LAB — URINE CULTURE: Culture: >=100000 — AB

## 2023-12-26 ENCOUNTER — Other Ambulatory Visit: Payer: Self-pay | Admitting: Cardiology

## 2023-12-26 DIAGNOSIS — I4819 Other persistent atrial fibrillation: Secondary | ICD-10-CM

## 2023-12-26 NOTE — Telephone Encounter (Signed)
 Eliquis  5mg  refill request received. Patient is 89 years old, weight-88.8kg, Crea-0.83 on 05/26/23, Diagnosis-Afib, and last seen by Dr Shlomo on 05/26/23. Dose is appropriate based on dosing criteria. Will send in refill to requested pharmacy.

## 2024-01-01 DIAGNOSIS — M51362 Other intervertebral disc degeneration, lumbar region with discogenic back pain and lower extremity pain: Secondary | ICD-10-CM | POA: Diagnosis not present

## 2024-01-01 DIAGNOSIS — M47816 Spondylosis without myelopathy or radiculopathy, lumbar region: Secondary | ICD-10-CM | POA: Diagnosis not present

## 2024-01-13 ENCOUNTER — Ambulatory Visit
Admission: RE | Admit: 2024-01-13 | Discharge: 2024-01-13 | Disposition: A | Discharge status: Home / Self Care | Payer: Self-pay | Source: Ambulatory Visit | Attending: Nurse Practitioner

## 2024-01-13 VITALS — BP 181/93 | HR 77 | Temp 98.1°F | Resp 16

## 2024-01-13 DIAGNOSIS — I10 Essential (primary) hypertension: Secondary | ICD-10-CM

## 2024-01-13 DIAGNOSIS — R350 Frequency of micturition: Secondary | ICD-10-CM

## 2024-01-13 LAB — POCT URINE DIPSTICK
Bilirubin, UA: NEGATIVE
Blood, UA: NEGATIVE
Glucose, UA: NEGATIVE mg/dL
Ketones, POC UA: NEGATIVE mg/dL
Nitrite, UA: NEGATIVE
POC PROTEIN,UA: NEGATIVE
Spec Grav, UA: 1.025 (ref 1.010–1.025)
Urobilinogen, UA: 0.2 U/dL
pH, UA: 6.5 (ref 5.0–8.0)

## 2024-01-13 MED ORDER — CIPROFLOXACIN HCL 500 MG PO TABS: 500.0000 mg | ORAL_TABLET | Freq: Two times a day (BID) | ORAL | 0 refills | Status: AC

## 2024-01-13 NOTE — ED Triage Notes (Addendum)
 Pt st's she was here on 9/3 and dx with a UTI  St's she was given Rx for antibiotics.  Pt st's she has finished the antibiotics but thinks she still may have an infection  Pt c/o urgency and discomfort  Also c/o slight pain in mid lower abd and top of legs

## 2024-01-13 NOTE — Discharge Instructions (Addendum)
 You were seen today for symptoms consistent with a urinary tract infection. You have been prescribed an antibiotic to treat the infection. Take the antibiotics exactly as prescribed and complete the full course, even if you start feeling better. A urine culture has been sent to identify the specific bacteria causing the infection and to confirm that the prescribed antibiotic is appropriate. You will only be contacted if your results are abnormal; otherwise, you may review them in your MyChart account. It is important to stay well hydrated by drinking plenty of fluids throughout the day. This helps flush out your urinary system and keeps your urine light yellow, which is a sign of good hydration. Avoid caffeine and alcohol, as they can irritate the bladder. Be sure to urinate regularly and empty your bladder fully. Do not hold your urine for extended periods. Always wipe from front to back after using the bathroom and use a clean tissue for each wipe. Avoid douching or using sprays or powders in the genital area, as these can cause irritation. Follow up with your healthcare provider if your symptoms do not improve within a few days, get worse, or return after completing your treatment.  Your blood pressure was elevated during today's visit.  It is important that you take your prescribed blood pressure medication as soon as you get home and continue to take it daily as directed. Consistent medication adherence, along with dietary changes, regular exercise, and other lifestyle modifications, will be essential in managing your blood pressure and preventing complications. This includes reducing your salt intake, maintaining a healthy weight, exercising regularly, limiting alcohol consumption, and not smoking. Please monitor your blood pressure regularly, and if you experience any symptoms such as dizziness, chest pain, or severe headaches, seek medical attention immediately. Follow up with your primary care provider to  monitor your blood pressure and make any necessary adjustments to your treatment plan.

## 2024-01-13 NOTE — ED Provider Notes (Signed)
 [name]    CSN: [id number] Arrival date & time: 01/13/24  1140      History   Chief Complaint Chief Complaint  Patient presents with   Urinary Frequency    I had a UTI a few weeks back and I am concerned that it is not over - Entered by patient    HPI Natasha [name] is a 89 y.o. female.   Discussed the use of AI scribe software for clinical note transcription with the patient, who gave verbal consent to proceed.   The patient presents for follow-up of a urinary tract infection diagnosed and treated on December 20, 2023. Urine culture was positive for E. coli and sensitive to Ciprofloxacin , which she completed along with Pyridium  for bladder spasms. While symptoms improved, they did not fully resolve. She continues to experience urinary urgency and describes a persistent nagging feeling down there, stating she is more aware of her urinary system than usual. She denies significant dysuria and reports that bladder spasms have largely resolved. She notes her urine has a slightly strong odor and that she is urinating more frequently than baseline. She denies itching, irritation, vaginal discharge, or hematuria. She has chronic back pain but reports no new or unusual lower back pain associated with this issue. She denies blurred vision, headache, dizziness, chest pain, shortness of breath, palpitations, or tachycardia. History is also notable for arthritis in her hands and neuropathy in her feet, with chronic numbness and tingling, though without any recent changes.  The following sections of the patient's history were reviewed and updated as appropriate: allergies, current medications, past family history, past medical history, past social history, past surgical history, and problem list.       Past Medical History:  Diagnosis Date   Aortic atherosclerosis    CHF (congestive heart failure) (HCC)    pt. denies   Chronic diastolic heart failure (HCC) 04/22/2015    Complication of anesthesia    COPD (chronic obstructive pulmonary disease) (HCC)    Diverticular disease    DJD (degenerative joint disease)    Dysphagia    Dysrhythmia    afib   GERD (gastroesophageal reflux disease)    Hiatal hernia    Hypertension    Hypothyroidism    Lower leg edema    Lower leg edema    Neuromuscular disorder (HCC)    neuropathy   PAF (paroxysmal atrial fibrillation) (HCC)    s/p ablation x several times now with occasional episodes of PAF.   She has failed sotolol/dofetilide/flecainide/propafenone/Amio and dronedarone either with not controlling her PAF or having side effects   Pneumonia    PONV (postoperative nausea and vomiting)    Restless leg syndrome    Seasonal allergies     Patient Active Problem List   Diagnosis Date Noted   Chronic low back pain 12/20/2023   Long term current use of anticoagulant therapy 12/20/2023   Hot flashes due to menopause 12/20/2023   Menopausal symptom 12/20/2023   Migraine, unspecified, not intractable, without status migrainosus 12/20/2023   Non-refractory chronic migraine without aura 12/20/2023   Migraine 12/20/2023   Migraine without aura and responsive to treatment 12/20/2023   Onychomycosis 12/20/2023   Peripheral neuropathy 12/20/2023   Vitamin D deficiency, unspecified 12/20/2023   Diastolic CHF, chronic (HCC) 12/20/2023   Gastro-esophageal reflux disease without esophagitis 12/20/2023   Essential hypertension 12/20/2023   Goiter 12/20/2023   Hypothyroidism, unspecified 12/20/2023   Multiple nodules of lung 12/20/2023   Arthritis of  both knees 12/20/2023   Osteoarthritis of both knees 12/20/2023   Restless legs syndrome 12/20/2023   Aortic atherosclerosis    Acquired thrombophilia 05/11/2023   Lumbar radiculopathy 05/11/2023   Atrial fibrillation (HCC) 05/11/2023   Osteoarthritis of right knee 10/28/2020   Degeneration of lumbar intervertebral disc 09/04/2019   Pain in right knee 07/05/2017   Pain in  left knee 07/05/2017   Chronic fatigue 01/10/2017   Chronic diastolic heart failure (HCC) 04/22/2015   Atrial ectopic tachycardia 01/26/2015   Nodule of right lung 09/07/2014   Bradycardia 12/25/2013   SOB (shortness of breath) 12/12/2013   Femoral bruit 08/27/2013   Claudication 08/02/2013   GERD (gastroesophageal reflux disease)    Hypertension    Hypothyroidism    Restless leg syndrome    Diverticular disease    PONV (postoperative nausea and vomiting)    Hiatal hernia    DJD (degenerative joint disease) 05/18/2011   History of anticoagulant therapy 05/18/2011   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   History of migraine headaches 05/18/2011   Persistent atrial fibrillation (HCC) 05/18/2011   Anticoagulated on warfarin 05/18/2011   Hashimoto's thyroiditis 05/18/2011   Atypical chest pain 05/18/2011   Dysphagia 03/03/2011    Past Surgical History:  Procedure Laterality Date   ABDOMINAL HYSTERECTOMY     APPENDECTOMY     ATRIAL FIBRILLATION ABLATION  09/06/2013   2'14 -[facility]   BALLOON DILATION  03/03/2011   Procedure: BALLOON DILATION;  Surgeon: Gladis MARLA Louder, MD;  Location: [facility];  Service: Endoscopy;  Laterality: N/A;   bladder polyps     BREAST EXCISIONAL BIOPSY Left    BREAST SURGERY     left lumpectomy   CARDIOVERSION     x3   CARDIOVERSION N/A 12/30/2014   Procedure: CARDIOVERSION;  Surgeon: Redell GORMAN Shallow, MD;  Location: [facility] ENDOSCOPY;  Service: Cardiovascular;  Laterality: N/A;   CARDIOVERSION N/A 04/23/2015   Procedure: CARDIOVERSION;  Surgeon: Jerel Balding, MD;  Location: [facility] ENDOSCOPY;  Service: Cardiovascular;  Laterality: N/A;   CATARACT EXTRACTION Left    COLONOSCOPY WITH PROPOFOL  N/A 09/24/2013   Procedure: COLONOSCOPY WITH PROPOFOL ;  Surgeon: Gladis MARLA Louder, MD;  Location: [facility];  Service: Endoscopy;  Laterality: N/A;   ESOPHAGOGASTRODUODENOSCOPY  03/03/2011   Procedure: ESOPHAGOGASTRODUODENOSCOPY (EGD);   Surgeon: Gladis MARLA Louder, MD;  Location: [facility];  Service: Endoscopy;  Laterality: N/A;   ESOPHAGOGASTRODUODENOSCOPY (EGD) WITH PROPOFOL  Bilateral 03/04/2022   Procedure: ESOPHAGOGASTRODUODENOSCOPY (EGD) WITH PROPOFOL ;  Surgeon: Rosalie Kitchens, MD;  Location: [facility];  Service: Gastroenterology;  Laterality: Bilateral;   EYE SURGERY     cataracat   RHINOPLASTY     RIGHT HEART CATHETERIZATION N/A 07/11/2014   Procedure: RIGHT HEART CATH;  Surgeon: Ezra GORMAN Shuck, MD;  Location: [facility] CATH LAB;  Service: Cardiovascular;  Laterality: N/A;   ROTATOR CUFF REPAIR     THYROIDECTOMY  1973   TONSILLECTOMY  1942   TOTAL KNEE ARTHROPLASTY Right 10/28/2020   Procedure: TOTAL KNEE ARTHROPLASTY;  Surgeon: Gerome Charleston, MD;  Location: [facility];  Service: Orthopedics;  Laterality: Right;  with adductor canal   TUBAL LIGATION      OB History   No obstetric history on file.      Home Medications    Prior to Admission medications   Medication Sig Start Date End Date Taking? Authorizing Provider  ciprofloxacin  (CIPRO ) 500 MG tablet Take 1 tablet (500 mg total) by mouth every 12 (twelve) hours for 3 days. 01/13/24 01/16/24 Yes  Iola Lukes, FNP  acetaminophen  (TYLENOL ) 500 MG tablet Take 1,000 mg by mouth every 8 (eight) hours as needed for moderate pain.    [provider]  augmented betamethasone dipropionate (DIPROLENE-AF) 0.05 % cream Apply topically daily as needed. 04/28/23   [provider]  beclomethasone (QVAR  REDIHALER) 80 MCG/ACT inhaler Inhale 1-2 puffs into the lungs 2 (two) times daily as needed (shortness of breath or wheezing). 11/30/23   Cobb, Comer GAILS, NP  benzonatate  (TESSALON ) 100 MG capsule Take 100 mg by mouth 2 (two) times daily as needed.    [provider]  butalbital-acetaminophen -caffeine (FIORICET) 50-325-40 MG tablet Take 1 tablet by mouth 2 (two) times daily as needed for headache.    [provider]  Calcium-Magnesium -Vitamin D  (CITRACAL CALCIUM+D) 600-40-500 MG-MG-UNIT TB24 Take 1 tablet by mouth daily at 2 PM.    [provider]  cefadroxil (DURICEF) 500 MG capsule Take 1 capsule by mouth 2 (two) times daily. 01/10/23   [provider]  cyanocobalamin (VITAMIN B12) 1000 MCG tablet Take 1,000 mcg by mouth daily. Patient not taking: No sig reported    [provider]  ELIQUIS  5 MG TABS tablet TAKE 1 TABLET BY MOUTH TWO TIMES DAILY. 12/26/23   Shlomo Wilbert SAUNDERS, MD  esomeprazole (NEXIUM) 20 MG capsule Take 20 mg by mouth daily at 12 noon. Patient taking differently: Take 20 mg by mouth daily at 12 noon.    [provider]  estradiol  (CLIMARA  - DOSED IN MG/24 HR) 0.025 mg/24hr patch Place 0.025 mg onto the skin once a week. 06/03/19   [provider]  Fluticasone Furoate  (ARNUITY ELLIPTA ) 50 MCG/ACT AEPB Inhale 1 puff into the lungs daily. Patient not taking: No sig reported 08/29/23   Geronimo Amel, MD  furosemide  (LASIX ) 40 MG tablet TAKE 1 AND 1/2 TABLETS BY MOUTH DAILY. Patient taking differently: TAKE 1 AND 1/2 TABLETS BY MOUTH DAILY. 07/25/23   Waddell Danelle ORN, MD  HYDROcodone  bit-homatropine [facility]) 5-1.5 MG/5ML syrup Take 5 mLs by mouth. 06/28/23   [provider]  LORazepam  (ATIVAN ) 1 MG tablet Take 0.5-1 mg by mouth at bedtime as needed for sleep. For sleep    [provider]  MAGNESIUM  PO Take 400 mg by mouth daily.    [provider]  molnupiravir EUA (LAGEVRIO) 200 MG CAPS capsule Take 4 capsules by mouth 2 (two) times daily.    [provider]  mupirocin ointment (BACTROBAN) 2 % Apply 1 application topically 2 (two) times daily as needed (wound care).    [provider]  OVER THE COUNTER MEDICATION Apply 1 application topically at bedtime as needed (pain). [employer] pain relief cream    [provider]  phenazopyridine  (PYRIDIUM ) 200 MG tablet Take 1 tablet (200 mg total) by mouth 3 (three) times daily. 12/20/23    Billy Asberry FALCON, PA-C  potassium chloride  (KLOR-CON ) 10 MEQ tablet TAKE 1 TABLET BY MOUTH DAILY Patient taking differently: TAKE 1 TABLET BY MOUTH DAILY 05/17/23   Waddell Danelle ORN, MD  pramipexole  (MIRAPEX ) 1 MG tablet Take 1 mg by mouth at bedtime.    [provider]  predniSONE (DELTASONE) 10 MG tablet Take 10 mg by mouth as directed. 06/28/23   [provider]  promethazine  (PHENERGAN ) 12.5 MG tablet Take 12.5 mg by mouth every 8 (eight) hours as needed. 09/15/23   [provider]  promethazine  (PHENERGAN ) 25 MG tablet Take 25 mg by mouth every 6 (six) hours as needed. 11/25/22  [provider]  Riboflavin 400 MG CAPS Take 400 mg by mouth daily.    [provider]  sennosides-docusate sodium  (SENOKOT-S) 8.6-50 MG tablet Take 1 tablet by mouth daily as needed for constipation.    [provider]  SYNTHROID  125 MCG tablet Take 125 mcg by mouth every morning.    [provider]  terbinafine (LAMISIL) 250 MG tablet Take 250 mg by mouth daily.    [provider]  traMADol (ULTRAM) 50 MG tablet Take 50 mg by mouth 3 (three) times daily. 09/15/23   [provider]  triamcinolone  (KENALOG ) 0.025 % cream Apply 1 Application topically 2 (two) times daily.    [provider]  triamcinolone  cream (KENALOG ) 0.1 % Apply 1 Application topically 2 (two) times daily.    [provider]  valsartan (DIOVAN) 80 MG tablet Take 40 mg by mouth daily as needed (Take 40 mg if blood pressure is over 130). 10/05/20   [provider]  verapamil  (CALAN -SR) 120 MG CR tablet Take 120 mg by mouth daily as needed (Tachycardia). Patient not taking: No sig reported    [provider]  Vitamin D, Cholecalciferol, 50 MCG (2000 UT) CAPS Take 2,000 Units by mouth daily.    [provider]    Family History Family History  Problem Relation Age of Onset   Transient ischemic attack Mother    Heart failure Father     CVA Father    Lung cancer Father    Hypertension Sister    Cancer Brother    Sudden death Brother    Anesthesia problems Neg Hx    Hypotension Neg Hx    Malignant hyperthermia Neg Hx    Pseudochol deficiency Neg Hx    Heart attack Neg Hx     Social History Social History   Tobacco Use   Smoking status: Never    Passive exposure: Past   Smokeless tobacco: Never  Vaping Use   Vaping status: Never Used  Substance Use Topics   Alcohol use: Not Currently    Comment: rare wine   Drug use: No     Allergies   Codeine, Penicillins, Tetracycline, Tramadol, Cortisone, Digoxin , Digoxin  and related, Duloxetine  hcl, Erythromycin, Flecainide, Gabapentin , Nortriptyline , Omeprazole, Propofol , Sulfa antibiotics, Sumatriptan base, Tetracycline hcl, Tetracyclines & related, Diltiazem , Diltiazem  hcl, Pregabalin , and Sulfites   Review of Systems Review of Systems  Eyes:  Negative for visual disturbance.  Respiratory:  Negative for shortness of breath.   Cardiovascular:  Negative for chest pain and palpitations.  Gastrointestinal:  Positive for abdominal pain (suprapubic discomfort).  Genitourinary:  Positive for frequency and urgency. Negative for dysuria, hematuria and vaginal discharge.       Odor to urine. Bladder spasms resolved. No vaginal itching or irritation.  Musculoskeletal:  Positive for back pain (hx chronic low back pain; no change from usual).  Neurological:  Negative for dizziness and headaches.  All other systems reviewed and are negative.    Physical Exam Triage Vital Signs ED Triage Vitals  Encounter Vitals Group     BP 01/13/24 1209 (!) 192/97     Girls Systolic BP Percentile --      Girls Diastolic BP Percentile --      Boys Systolic BP Percentile --      Boys Diastolic BP Percentile --      Pulse Rate 01/13/24 1209 77     Resp 01/13/24 1209 16     Temp 01/13/24 1209 98.1 F (36.7 C)  Temp Source 01/13/24 1209 Oral     SpO2 01/13/24 1209 96 %      Weight --      Height --      Head Circumference --      Peak Flow --      Pain Score 01/13/24 1206 2     Pain Loc --      Pain Education --      Exclude from Growth Chart --    No data found.  Updated Vital Signs BP (!) 181/93 (BP Location: Left Arm)   Pulse 77   Temp 98.1 F (36.7 C) (Oral)   Resp 16   SpO2 96%   Visual Acuity Right Eye Distance:   Left Eye Distance:   Bilateral Distance:    Right Eye Near:   Left Eye Near:    Bilateral Near:     Physical Exam Vitals reviewed.  Constitutional:      General: She is awake. She is not in acute distress.    Appearance: Normal appearance. She is well-developed. She is not ill-appearing, toxic-appearing or diaphoretic.  HENT:     Head: Normocephalic.     Right Ear: Hearing normal.     Left Ear: Hearing normal.     Nose: Nose normal.     Mouth/Throat:     Mouth: Mucous membranes are moist.  Eyes:     General: Vision grossly intact.     Conjunctiva/sclera: Conjunctivae normal.  Cardiovascular:     Rate and Rhythm: Normal rate and regular rhythm.     Heart sounds: Normal heart sounds.  Pulmonary:     Effort: Pulmonary effort is normal.     Breath sounds: Normal breath sounds and air entry.  Musculoskeletal:        General: Normal range of motion.     Cervical back: Normal range of motion and neck supple.  Skin:    General: Skin is warm and dry.  Neurological:     General: No focal deficit present.     Mental Status: She is alert and oriented to person, place, and time.  Psychiatric:        Speech: Speech normal.        Behavior: Behavior is cooperative.      UC Treatments / Results  Labs (all labs ordered are listed, but only abnormal results are displayed) Labs Reviewed  POCT URINE DIPSTICK - Abnormal; Notable for the following components:      Result Value   Color, UA other (*)    Clarity, UA cloudy (*)    Leukocytes, UA Trace (*)    All other components within normal limits  URINE CULTURE     EKG   Radiology No results found.  Procedures Procedures (including critical care time)  Medications Ordered in UC Medications - No data to display  Initial Impression / Assessment and Plan / UC Course  I have reviewed the triage vital signs and the nursing notes.  Pertinent labs & imaging results that were available during my care of the patient were reviewed by me and considered in my medical decision making (see chart for details).     Patient presents with symptoms consistent with a urinary tract infection. Urinalysis reveals trace leukocyte esterase only which is improved from prior earlier this month. Cipro  was prescribed to be taken twice daily for 3 days. A urine culture was sent to identify the causative organism and assess antibiotic sensitivity. Patient was advised that they will be  contacted only if the culture results require a change in treatment; otherwise, results can be reviewed via MyChart. Patient instructed to increase fluid intake and monitor symptoms. Follow up with primary care provider if symptoms do not improve or worsen. Emergency evaluation is warranted for fever, back or flank pain, nausea, vomiting, or signs of systemic illness.  The patient was also noted to have an elevated blood pressure noted during today's visit. The patient denies any symptoms such as dizziness, headaches, or chest pain. No focal deficits were observed, and the patient is in no acute distress. Despite the elevated blood pressure, the patient is not experiencing any acute symptoms related to hypertension. Patient reports that she hasn't taken her BP medications today. The patient was advised to take their prescribed blood pressure medication as soon as they get home. Emphasis was placed on daily compliance with the medication regimen, as well as the importance of diet and lifestyle changes, including weight management, reducing sodium intake, increasing physical activity, and avoiding  smoking or excessive alcohol use. These lifestyle modifications are critical for controlling hypertension and preventing complications, such as stroke, heart disease, or kidney damage.  Today's evaluation has revealed no signs of a dangerous process. Discussed diagnosis with patient and/or guardian. Patient and/or guardian aware of their diagnosis, possible red flag symptoms to watch out for and need for close follow up. Patient and/or guardian understands verbal and written discharge instructions. Patient and/or guardian comfortable with plan and disposition.  Patient and/or guardian has a clear mental status at this time, good insight into illness (after discussion and teaching) and has clear judgment to make decisions regarding their care  Documentation was completed with the aid of voice recognition software. Transcription may contain typographical errors. Final Clinical Impressions(s) / UC Diagnoses   Final diagnoses:  Urinary frequency  Elevated blood pressure reading with diagnosis of hypertension     Discharge Instructions      You were seen today for symptoms consistent with a urinary tract infection. You have been prescribed an antibiotic to treat the infection. Take the antibiotics exactly as prescribed and complete the full course, even if you start feeling better. A urine culture has been sent to identify the specific bacteria causing the infection and to confirm that the prescribed antibiotic is appropriate. You will only be contacted if your results are abnormal; otherwise, you may review them in your MyChart account. It is important to stay well hydrated by drinking plenty of fluids throughout the day. This helps flush out your urinary system and keeps your urine light yellow, which is a sign of good hydration. Avoid caffeine and alcohol, as they can irritate the bladder. Be sure to urinate regularly and empty your bladder fully. Do not hold your urine for extended periods. Always  wipe from front to back after using the bathroom and use a clean tissue for each wipe. Avoid douching or using sprays or powders in the genital area, as these can cause irritation. Follow up with your healthcare provider if your symptoms do not improve within a few days, get worse, or return after completing your treatment.  Your blood pressure was elevated during today's visit.  It is important that you take your prescribed blood pressure medication as soon as you get home and continue to take it daily as directed. Consistent medication adherence, along with dietary changes, regular exercise, and other lifestyle modifications, will be essential in managing your blood pressure and preventing complications. This includes reducing your salt intake, maintaining  a healthy weight, exercising regularly, limiting alcohol consumption, and not smoking. Please monitor your blood pressure regularly, and if you experience any symptoms such as dizziness, chest pain, or severe headaches, seek medical attention immediately. Follow up with your primary care provider to monitor your blood pressure and make any necessary adjustments to your treatment plan.      ED Prescriptions     Medication Sig Dispense Auth. Provider   ciprofloxacin  (CIPRO ) 500 MG tablet Take 1 tablet (500 mg total) by mouth every 12 (twelve) hours for 3 days. 6 tablet Iola Lukes, FNP      PDMP not reviewed this encounter.   Iola Lukes, [place] 01/13/24 [phone]

## 2024-01-14 LAB — URINE CULTURE: Culture: <10000 — AB

## 2024-01-15 ENCOUNTER — Ambulatory Visit (HOSPITAL_COMMUNITY): Payer: Self-pay

## 2024-01-16 DIAGNOSIS — J449 Chronic obstructive pulmonary disease, unspecified: Secondary | ICD-10-CM | POA: Diagnosis not present

## 2024-01-16 DIAGNOSIS — I10 Essential (primary) hypertension: Secondary | ICD-10-CM | POA: Diagnosis not present

## 2024-01-16 DIAGNOSIS — I4891 Unspecified atrial fibrillation: Secondary | ICD-10-CM | POA: Diagnosis not present

## 2024-01-16 DIAGNOSIS — I5032 Chronic diastolic (congestive) heart failure: Secondary | ICD-10-CM | POA: Diagnosis not present

## 2024-01-19 DIAGNOSIS — I5032 Chronic diastolic (congestive) heart failure: Secondary | ICD-10-CM | POA: Diagnosis not present

## 2024-01-19 DIAGNOSIS — I4891 Unspecified atrial fibrillation: Secondary | ICD-10-CM | POA: Diagnosis not present

## 2024-01-19 DIAGNOSIS — J449 Chronic obstructive pulmonary disease, unspecified: Secondary | ICD-10-CM | POA: Diagnosis not present

## 2024-01-19 DIAGNOSIS — I10 Essential (primary) hypertension: Secondary | ICD-10-CM | POA: Diagnosis not present

## 2024-02-14 DIAGNOSIS — Z23 Encounter for immunization: Secondary | ICD-10-CM | POA: Diagnosis not present

## 2024-02-16 DIAGNOSIS — I4891 Unspecified atrial fibrillation: Secondary | ICD-10-CM | POA: Diagnosis not present

## 2024-02-16 DIAGNOSIS — J449 Chronic obstructive pulmonary disease, unspecified: Secondary | ICD-10-CM | POA: Diagnosis not present

## 2024-02-16 DIAGNOSIS — I5032 Chronic diastolic (congestive) heart failure: Secondary | ICD-10-CM | POA: Diagnosis not present

## 2024-02-16 DIAGNOSIS — I10 Essential (primary) hypertension: Secondary | ICD-10-CM | POA: Diagnosis not present

## 2024-02-18 DIAGNOSIS — I4891 Unspecified atrial fibrillation: Secondary | ICD-10-CM | POA: Diagnosis not present

## 2024-02-18 DIAGNOSIS — I5032 Chronic diastolic (congestive) heart failure: Secondary | ICD-10-CM | POA: Diagnosis not present

## 2024-02-18 DIAGNOSIS — J449 Chronic obstructive pulmonary disease, unspecified: Secondary | ICD-10-CM | POA: Diagnosis not present

## 2024-02-18 DIAGNOSIS — I10 Essential (primary) hypertension: Secondary | ICD-10-CM | POA: Diagnosis not present

## 2024-02-29 DIAGNOSIS — I5032 Chronic diastolic (congestive) heart failure: Secondary | ICD-10-CM | POA: Diagnosis not present

## 2024-02-29 DIAGNOSIS — I10 Essential (primary) hypertension: Secondary | ICD-10-CM | POA: Diagnosis not present

## 2024-02-29 DIAGNOSIS — Z7409 Other reduced mobility: Secondary | ICD-10-CM | POA: Diagnosis not present

## 2024-02-29 DIAGNOSIS — G6289 Other specified polyneuropathies: Secondary | ICD-10-CM | POA: Diagnosis not present

## 2024-02-29 DIAGNOSIS — Z23 Encounter for immunization: Secondary | ICD-10-CM | POA: Diagnosis not present

## 2024-02-29 DIAGNOSIS — G2581 Restless legs syndrome: Secondary | ICD-10-CM | POA: Diagnosis not present

## 2024-02-29 DIAGNOSIS — I4891 Unspecified atrial fibrillation: Secondary | ICD-10-CM | POA: Diagnosis not present

## 2024-03-17 DIAGNOSIS — I10 Essential (primary) hypertension: Secondary | ICD-10-CM | POA: Diagnosis not present

## 2024-03-17 DIAGNOSIS — I5032 Chronic diastolic (congestive) heart failure: Secondary | ICD-10-CM | POA: Diagnosis not present

## 2024-03-17 DIAGNOSIS — I4891 Unspecified atrial fibrillation: Secondary | ICD-10-CM | POA: Diagnosis not present

## 2024-03-17 DIAGNOSIS — J449 Chronic obstructive pulmonary disease, unspecified: Secondary | ICD-10-CM | POA: Diagnosis not present

## 2024-03-21 ENCOUNTER — Telehealth: Payer: Self-pay | Admitting: *Deleted

## 2024-03-21 ENCOUNTER — Ambulatory Visit: Payer: Self-pay

## 2024-03-21 DIAGNOSIS — J45901 Unspecified asthma with (acute) exacerbation: Secondary | ICD-10-CM

## 2024-03-21 MED ORDER — AZITHROMYCIN 250 MG PO TABS: ORAL_TABLET | ORAL | 0 refills | Status: AC

## 2024-03-21 MED ORDER — PREDNISONE 10 MG PO TABS: ORAL_TABLET | ORAL | 0 refills | Status: DC

## 2024-03-21 MED ORDER — AZITHROMYCIN 250 MG PO TABS: ORAL_TABLET | ORAL | 0 refills | Status: DC

## 2024-03-21 NOTE — Telephone Encounter (Signed)
Dr. Chase Caller please advise. Thank you!

## 2024-03-21 NOTE — Telephone Encounter (Signed)
 Message sent to PA team to see what is covered since Qvar  is not covered.

## 2024-03-21 NOTE — Telephone Encounter (Signed)
 Patient states Qvar  is not covered by insurance.  Please advise what is on formulary.  Thank you.

## 2024-03-21 NOTE — Telephone Encounter (Signed)
 E2C2 Pulmonary Triage - Initial Assessment Questions "Chief Complaint (e.g., cough, sob, wheezing, fever, chills, sweat or additional symptoms) *Go to specific symptom protocol after initial questions. Sob with exertion, intermittent wheezing, coughing, hoarseness  "How long have symptoms been present?" Week ago   MEDICINES:   "Have you used any OTC meds to help with symptoms?" No If yes, ask "What medications?" na  "Have you used your inhalers/maintenance medication?" No  OXYGEN : "Do you wear supplemental oxygen ?" No If yes, "How many liters are you supposed to use?" na  "Do you monitor your oxygen  levels?" No If yes, What is your reading (oxygen  level) today?   What is your usual oxygen  saturation reading?  (Note: Pulmonary O2 sats should be 90% or greater) 99% RA   Copied from CRM #[id number]. Topic: Clinical - Red Word Triage >> Mar 21, 2024  4:02 PM Rilla B wrote: [facility] that prompted transfer to Nurse Triage: Coughing, wheezing, some difficulty breathing.   ----------------------------------------------------------------------- From previous Reason for Contact - Prescription Issue: Reason for CRM: Reason for Disposition  [1] MILD difficulty breathing (e.g., minimal/no SOB at rest, SOB with walking, pulse < 100) AND [2] NEW-onset or WORSE than normal  Answer Assessment - Initial Assessment Questions No available appts today. Patient requesting appt with Ramasway MD, declines alternative provider.  Advised UC now and ED/911 if symptoms worsen  Patient declines UC and reports will call 911 if symptoms worsen.  Patient requests prescription for inhaler, does not want Albuterol due to causes AFIB.  Patient has not available meds at home; QVAR  was not covered by insurance.   1. RESPIRATORY STATUS: Describe your breathing? (e.g., wheezing, shortness of breath, unable to speak, severe coughing)      Sob with exertion; comes and goes, coughing;dry, wheezing  intermittent, hoarseness 2. ONSET: When did this breathing problem begin?      Week ago 3. PATTERN Does the difficult breathing come and go, or has it been constant since it started?      Comes and goes 4. SEVERITY: How bad is your breathing? (e.g., mild, moderate, severe)      mild 5. RECURRENT SYMPTOM: Have you had difficulty breathing before? If Yes, ask: When was the last time? and What happened that time?      no 6. CARDIAC HISTORY: Do you have any history of heart disease? (e.g., heart attack, angina, bypass surgery, angioplasty)      Afib, chf 7. LUNG HISTORY: Do you have any history of lung disease?  (e.g., pulmonary embolus, asthma, emphysema)     copd 8. CAUSE: What do you think is causing the breathing problem?      Need inhaler, does not have any meds 9. OTHER SYMPTOMS: Do you have any other symptoms? (e.g., chest pain, cough, dizziness, fever, runny nose)     Denies chest pain, dizziness, fever, sore throat, runny nose Reports dry cough 10. O2 SATURATION MONITOR:  Do you use an oxygen  saturation monitor (pulse oximeter) at home? If Yes, ask: What is your reading (oxygen  level) today? What is your usual oxygen  saturation reading? (e.g., 95%)       99% RA Does not use any oxygen  Denies fever, chills, n/v/d, dizziness, chest pain, diff breathing Sob exertion come and goes   Vaccine-RSV 02/29/24  Protocols used: Breathing Difficulty-A-AH

## 2024-03-21 NOTE — Telephone Encounter (Signed)
 REc -Please take prednisone 40 mg x1 day, then 30 mg x1 day, then 20 mg x1 day, then 10 mg x1 day, and then 5 mg x1 day and stop   -  Z pak  Sid efeects counselened   SIGNATURE    Dr. Dorethia Cave, M.D., F.C.C.P,  Pulmonary and Critical Care Medicine Staff Physician, [facility] Director - Interstitial Lung Disease  Program  Pulmonary Fibrosis [facility] at [facility] [place], [place], [address]   Pager: [phone], If no answer  -> Check AMION or Try [phone] Telephone (clinical office): [phone] Telephone (research): [phone]  5:51 PM 03/21/2024   Allergies  Allergen Reactions   Codeine Nausea And Vomiting and Nausea Only    Other Reaction(s): nausea/vomiting (can take Hydrocodone    Penicillins Hives    Has patient had a PCN reaction causing immediate rash, facial/tongue/throat swelling, SOB or lightheadedness with hypotension: No  Has patient had a PCN reaction causing severe rash involving mucus membranes or skin necrosis: No  Has patient had a PCN reaction that required hospitalization No  Has patient had a PCN reaction occurring within the last 10 years: No  If all of the above answers are NO, then may proceed with Cephalosporin use.  Other Reaction(s): Not available  Other Reaction(s): Not available, Unknown  Has patient had a PCN reaction causing immediate rash, facial/tongue/throat swelling, SOB or lightheadedness with hypotension: No Has patient had a PCN reaction causing severe rash involving mucus membranes or skin necrosis: No Has patient had a PCN reaction that required hospitalization No Has patient had a PCN reaction occurring within the last 10 years: No If all of the above answers are NO, then may proceed with Cephalosporin use.   Tetracycline Rash    Other Reaction(s): Other (See Comments)    Sores on legs   Tramadol Nausea Only   Cortisone     Other Reaction(s): tachycardia    Digoxin  Other (See Comments)    Flushed neck and face  Other Reaction(s): face flushing, Not available  digoxin    Digoxin  And Related Other (See Comments)    ' Makes my face blood red   Duloxetine  Hcl     Other Reaction(s): Not available   Erythromycin Hives and Other (See Comments)    Flushing on face  Other Reaction(s): Unknown  Other Reaction(s): Other (See Comments)    Flushing on face   Flecainide Itching    Other Reaction(s): hives/itching   Gabapentin      Other Reaction(s): rash   Nortriptyline      Other Reaction(s): High BP   Omeprazole     Other Reaction(s): headache   Propofol      Other Reaction(s): nausea and restless legs after procedure   Sulfa Antibiotics Other (See Comments) and Dermatitis    PRESERVATIVE OF SOME FOODS- MAKES FLUSHES TO FACE AND NECK  Other Reaction(s): Not available  Substance with sulfonamide structure and antibacterial mechanism of action (substance)  Other Reaction(s): Other (See Comments)    PRESERVATIVE OF SOME FOODS- MAKES FLUSHES TO FACE AND NECK  Sulfite and/or sulfite derivative (substance)   Sumatriptan Base     Other Reaction(s): rebound headache   Tetracycline Hcl     Other Reaction(s): Unknown   Tetracyclines & Related Other (See Comments)    Sores on legs   Diltiazem  Other (See Comments), Rash and Dermatitis    Rash with long acting form  Other Reaction(s): leg rash  diltiazem  hydrochloride   Diltiazem  Hcl Rash    Rash with long acting form   Pregabalin  Rash and Dermatitis   Sulfites Rash    PRESERVATIVE OF SOME FOODS- MAKES FLUSHES TO FACE AND NECK PRESERVATIVE OF SOME FOODS- MAKES FLUSHES TO FACE AND NECK

## 2024-03-22 ENCOUNTER — Telehealth: Payer: Self-pay

## 2024-03-22 ENCOUNTER — Other Ambulatory Visit (HOSPITAL_COMMUNITY): Payer: Self-pay

## 2024-03-22 DIAGNOSIS — J45991 Cough variant asthma: Secondary | ICD-10-CM

## 2024-03-22 MED ORDER — ARNUITY ELLIPTA 100 MCG/ACT IN AEPB: 1.0000 | INHALATION_SPRAY | Freq: Every day | RESPIRATORY_TRACT | 2 refills | Status: AC | PRN

## 2024-03-22 NOTE — Telephone Encounter (Signed)
*  Pulm  Pharmacy Patient Advocate Encounter   Received notification from Pt Calls Messages that prior authorization for Qvar  Redihaler  is required/requested.   Insurance verification completed.   The patient is insured through [employer].   Per test claim:  Arnuity Ellipta  is preferred by the insurance.  If suggested medication is appropriate, Please send in a new RX and discontinue this one. If not, please advise as to why it's not appropriate so that we may request a Prior Authorization. Please note, some preferred medications may still require a PA.  If the suggested medications have not been trialed and there are no contraindications to their use, the PA will not be submitted, as it will not be approved.

## 2024-03-22 NOTE — Telephone Encounter (Signed)
 Arnuity 1 puff daily PRN cough/wheezing/SOB sent. Brush tongue and rinse mouth afterwards. Same side effect profile as Qvar , which we reviewed at last OV. Thanks.

## 2024-04-02 NOTE — Telephone Encounter (Signed)
 I called and spoke with patient, provided information per [facility].  She said she still has the cough.  She does not have a f/u until March.  I advised her to call us  if the cough gets worse of if it does not get any better and we will get her in sooner.  She verbalized understanding.  Nothing further needed.

## 2024-05-23 NOTE — Addendum Note (Signed)
 Addended by: CLAUDENE NEVINS A on: 05/23/2024 02:24 PM   Modules accepted: Orders

## 2024-05-24 ENCOUNTER — Other Ambulatory Visit (HOSPITAL_BASED_OUTPATIENT_CLINIC_OR_DEPARTMENT_OTHER): Payer: Self-pay

## 2024-05-24 ENCOUNTER — Other Ambulatory Visit: Payer: Self-pay | Admitting: Pulmonary Disease

## 2024-05-24 ENCOUNTER — Ambulatory Visit: Payer: Self-pay

## 2024-05-24 MED ORDER — PREDNISONE 20 MG PO TABS: ORAL_TABLET | ORAL | 0 refills | Status: DC

## 2024-05-24 MED ORDER — PREDNISONE 20 MG PO TABS: ORAL_TABLET | ORAL | 0 refills | Status: AC

## 2024-05-24 NOTE — Telephone Encounter (Signed)
 Prednisone taper sent to pharmacy on file

## 2024-05-24 NOTE — Telephone Encounter (Signed)
 FYI Only or Action Required?: Action required by provider: request for appointment and clinical question for provider.  Patient is followed in Pulmonology for COPD, last seen on 11/30/2023 by Malachy Comer GAILS, NP.  Called Nurse Triage reporting Cough and Fatigue.  Symptoms began several weeks ago.  Interventions attempted: Prescription medications: Unsure.  Symptoms are: gradually worsening.  Triage Disposition: See HCP Within 4 Hours (Or PCP Triage)  Patient/caregiver understands and will follow disposition?: Yes   Reason for Disposition  [1] MILD difficulty breathing (e.g., minimal/no SOB at rest, SOB with walking, pulse < 100) AND [2] still present when not coughing  Answer Assessment - Initial Assessment Questions Patient states that she has had an ongoing mild non-productive cough that has worsened over the last week. She reports fatigue, increased SOB, and wheezing as well. Office visit advised, but patient states that she is unable to get out of her home due to snow/ice. Offered virtual visit, but no available appts. Patient is requesting prescription for cough medicine.   1. ONSET: When did the cough begin?      Worsened about 1 week ago   2. SEVERITY: How bad is the cough today?      Moderate  3. SPUTUM: Describe the color of your sputum (e.g., none, dry cough; clear, white, yellow, green)     Clear, sometimes discolored  4. HEMOPTYSIS: Are you coughing up any blood? If Yes, ask: How much? (e.g., flecks, streaks, tablespoons, etc.)     Unknown  5. DIFFICULTY BREATHING: Are you having difficulty breathing? If Yes, ask: How bad is it? (e.g., mild, moderate, severe)      Reports mildly increased SOB  6. FEVER: Do you have a fever? If Yes, ask: What is your temperature, how was it measured, and when did it start?     No  7. CARDIAC HISTORY: Do you have any history of heart disease? (e.g., heart attack, congestive heart failure)      CHF  8. LUNG  HISTORY: Do you have any history of lung disease?  (e.g., pulmonary embolus, asthma, emphysema)     Right lung nodule, COPD  9. PE RISK FACTORS: Do you have a history of blood clots? (or: recent major surgery, recent prolonged travel, bedridden)     No  10. OTHER SYMPTOMS: Do you have any other symptoms? (e.g., runny nose, wheezing, chest pain)       Reports wheezing  11. PREGNANCY: Is there any chance you are pregnant? When was your last menstrual period?       NA  12. TRAVEL: Have you traveled out of the country in the last month? (e.g., travel history, exposures)       No  Protocols used: Cough - Acute Non-Productive-A-AH  Reason for Triage: Patient voicing complaints of very severe cough and feeling wimpy. Patient Is requesting medication without appointment (specified steroids), as she is still snowed into her home and desires medication. Patient is concerned that this issue could worsen due to her age.

## 2024-05-24 NOTE — Telephone Encounter (Signed)
 Please advise as pt is requesting cough meds and steroids

## 2024-05-24 NOTE — Telephone Encounter (Signed)
 Pt notified and she requested rx be sent to [facility] instead of [facility] so rx changed

## 2024-06-20 ENCOUNTER — Other Ambulatory Visit

## 2024-06-25 ENCOUNTER — Ambulatory Visit: Admitting: Internal Medicine
